# Patient Record
Sex: Male | Born: 1960 | Race: White | Hispanic: No | Smoking: Former smoker
Health system: Southern US, Community
[De-identification: ages and names within clinical notes are randomized; demographics above are authoritative.]

## PROBLEM LIST (undated history)

## (undated) DIAGNOSIS — K766 Portal hypertension: Secondary | ICD-10-CM

## (undated) DIAGNOSIS — D689 Coagulation defect, unspecified: Secondary | ICD-10-CM

## (undated) DIAGNOSIS — J189 Pneumonia, unspecified organism: Secondary | ICD-10-CM

## (undated) DIAGNOSIS — D649 Anemia, unspecified: Secondary | ICD-10-CM

## (undated) DIAGNOSIS — D696 Thrombocytopenia, unspecified: Secondary | ICD-10-CM

## (undated) DIAGNOSIS — K5790 Diverticulosis of intestine, part unspecified, without perforation or abscess without bleeding: Secondary | ICD-10-CM

## (undated) DIAGNOSIS — D126 Benign neoplasm of colon, unspecified: Secondary | ICD-10-CM

## (undated) DIAGNOSIS — E785 Hyperlipidemia, unspecified: Secondary | ICD-10-CM

## (undated) DIAGNOSIS — K7030 Alcoholic cirrhosis of liver without ascites: Secondary | ICD-10-CM

## (undated) DIAGNOSIS — L409 Psoriasis, unspecified: Secondary | ICD-10-CM

## (undated) DIAGNOSIS — F1010 Alcohol abuse, uncomplicated: Secondary | ICD-10-CM

## (undated) DIAGNOSIS — I8500 Esophageal varices without bleeding: Secondary | ICD-10-CM

## (undated) DIAGNOSIS — I10 Essential (primary) hypertension: Secondary | ICD-10-CM

## (undated) DIAGNOSIS — R42 Dizziness and giddiness: Secondary | ICD-10-CM

## (undated) DIAGNOSIS — K219 Gastro-esophageal reflux disease without esophagitis: Secondary | ICD-10-CM

## (undated) HISTORY — DX: Thrombocytopenia, unspecified: D69.6

## (undated) HISTORY — PX: OTHER SURGICAL HISTORY: SHX169

## (undated) HISTORY — DX: Hyperlipidemia, unspecified: E78.5

## (undated) HISTORY — DX: Alcoholic cirrhosis of liver without ascites: K70.30

## (undated) HISTORY — DX: Alcohol abuse, uncomplicated: F10.10

## (undated) HISTORY — DX: Esophageal varices without bleeding: I85.00

## (undated) HISTORY — DX: Diverticulosis of intestine, part unspecified, without perforation or abscess without bleeding: K57.90

## (undated) HISTORY — DX: Coagulation defect, unspecified: D68.9

## (undated) HISTORY — DX: Benign neoplasm of colon, unspecified: D12.6

## (undated) HISTORY — DX: Portal hypertension: K76.6

## (undated) HISTORY — DX: Dizziness and giddiness: R42

## (undated) HISTORY — DX: Gastro-esophageal reflux disease without esophagitis: K21.9

## (undated) HISTORY — PX: LIVER TRANSPLANT: SHX410

## (undated) HISTORY — DX: Psoriasis, unspecified: L40.9

## (undated) HISTORY — DX: Pneumonia, unspecified organism: J18.9

---

## 2006-07-14 ENCOUNTER — Emergency Department (HOSPITAL_COMMUNITY): Admission: EM | Admit: 2006-07-14 | Discharge: 2006-07-14 | Payer: Self-pay | Admitting: Family Medicine

## 2011-12-12 DIAGNOSIS — F1010 Alcohol abuse, uncomplicated: Secondary | ICD-10-CM

## 2011-12-12 HISTORY — DX: Alcohol abuse, uncomplicated: F10.10

## 2012-11-22 ENCOUNTER — Emergency Department (HOSPITAL_COMMUNITY)
Admission: EM | Admit: 2012-11-22 | Discharge: 2012-11-22 | Disposition: A | Discharge status: Home / Self Care | Payer: BC Managed Care – PPO | Attending: Emergency Medicine | Admitting: Emergency Medicine

## 2012-11-22 ENCOUNTER — Encounter (HOSPITAL_COMMUNITY): Payer: Self-pay | Admitting: *Deleted

## 2012-11-22 ENCOUNTER — Emergency Department (HOSPITAL_COMMUNITY): Payer: BC Managed Care – PPO

## 2012-11-22 DIAGNOSIS — I10 Essential (primary) hypertension: Secondary | ICD-10-CM | POA: Insufficient documentation

## 2012-11-22 DIAGNOSIS — R4182 Altered mental status, unspecified: Secondary | ICD-10-CM | POA: Insufficient documentation

## 2012-11-22 DIAGNOSIS — F10929 Alcohol use, unspecified with intoxication, unspecified: Secondary | ICD-10-CM

## 2012-11-22 DIAGNOSIS — F1010 Alcohol abuse, uncomplicated: Secondary | ICD-10-CM | POA: Insufficient documentation

## 2012-11-22 HISTORY — DX: Essential (primary) hypertension: I10

## 2012-11-22 LAB — URINALYSIS, ROUTINE W REFLEX MICROSCOPIC
Bilirubin Urine: NEGATIVE
Ketones, ur: NEGATIVE mg/dL
Nitrite: NEGATIVE
pH: 5 (ref 5.0–8.0)

## 2012-11-22 LAB — HEPATIC FUNCTION PANEL
ALT: 86 U/L — ABNORMAL HIGH (ref 0–53)
Alkaline Phosphatase: 106 U/L (ref 39–117)
Bilirubin, Direct: <0.1 mg/dL (ref 0.0–0.3)
Total Protein: 7.9 g/dL (ref 6.0–8.3)

## 2012-11-22 LAB — CBC
MCH: 34 pg (ref 26.0–34.0)
MCV: 97.6 fL (ref 78.0–100.0)
Platelets: 171 10*3/uL (ref 150–400)
RDW: 12.7 % (ref 11.5–15.5)

## 2012-11-22 LAB — POCT I-STAT, CHEM 8
BUN: 10 mg/dL (ref 6–23)
Calcium, Ion: 1.1 mmol/L — ABNORMAL LOW (ref 1.12–1.23)
Chloride: 104 mEq/L (ref 96–112)
Glucose, Bld: 127 mg/dL — ABNORMAL HIGH (ref 70–99)

## 2012-11-22 LAB — RAPID URINE DRUG SCREEN, HOSP PERFORMED
Barbiturates: NOT DETECTED
Benzodiazepines: NOT DETECTED
Tetrahydrocannabinol: NOT DETECTED

## 2012-11-22 LAB — BASIC METABOLIC PANEL
Calcium: 8.9 mg/dL (ref 8.4–10.5)
Creatinine, Ser: 0.86 mg/dL (ref 0.50–1.35)
GFR calc Af Amer: >90 mL/min (ref >90)
GFR calc non Af Amer: >90 mL/min (ref >90)

## 2012-11-22 LAB — PROTIME-INR: Prothrombin Time: 13.9 seconds (ref 11.6–15.2)

## 2012-11-22 MED ORDER — LORAZEPAM 1 MG PO TABS: 1.0000 mg | ORAL_TABLET | Freq: Three times a day (TID) | ORAL | Status: DC | PRN

## 2012-11-22 MED ORDER — ALUM & MAG HYDROXIDE-SIMETH 200-200-20 MG/5ML PO SUSP: 30.0000 mL | ORAL | Status: DC | PRN

## 2012-11-22 MED ORDER — ACETAMINOPHEN 325 MG PO TABS: 650.0000 mg | ORAL_TABLET | ORAL | Status: DC | PRN

## 2012-11-22 MED ORDER — SODIUM CHLORIDE 0.9 % IV SOLN
INTRAVENOUS | Status: DC
  Administered 2012-11-22 (×2): via INTRAVENOUS

## 2012-11-22 MED ORDER — LISINOPRIL-HYDROCHLOROTHIAZIDE 20-25 MG PO TABS: 1.0000 | ORAL_TABLET | Freq: Every day | ORAL | Status: DC

## 2012-11-22 MED ORDER — PROMETHAZINE HCL 25 MG PO TABS: 25.0000 mg | ORAL_TABLET | Freq: Four times a day (QID) | ORAL | Status: DC | PRN

## 2012-11-22 MED ORDER — LISINOPRIL 20 MG PO TABS
20.0000 mg | ORAL_TABLET | Freq: Every day | ORAL | Status: DC
  Administered 2012-11-22: 20 mg via ORAL
  Filled 2012-11-22: qty 1

## 2012-11-22 MED ORDER — THIAMINE HCL 100 MG/ML IJ SOLN
100.0000 mg | Freq: Every day | INTRAMUSCULAR | Status: DC
  Administered 2012-11-22: 100 mg via INTRAVENOUS
  Filled 2012-11-22: qty 2

## 2012-11-22 MED ORDER — CLONIDINE HCL 0.2 MG PO TABS
0.2000 mg | ORAL_TABLET | Freq: Two times a day (BID) | ORAL | Status: DC
  Administered 2012-11-22: 0.2 mg via ORAL
  Filled 2012-11-22: qty 1

## 2012-11-22 MED ORDER — THIAMINE HCL 100 MG/ML IJ SOLN: 100.0000 mg | Freq: Every day | INTRAMUSCULAR | Status: DC

## 2012-11-22 MED ORDER — ONDANSETRON HCL 8 MG PO TABS: 4.0000 mg | ORAL_TABLET | Freq: Three times a day (TID) | ORAL | Status: DC | PRN

## 2012-11-22 MED ORDER — IBUPROFEN 200 MG PO TABS: 600.0000 mg | ORAL_TABLET | Freq: Three times a day (TID) | ORAL | Status: DC | PRN

## 2012-11-22 MED ORDER — HYDROCHLOROTHIAZIDE 25 MG PO TABS
25.0000 mg | ORAL_TABLET | Freq: Every day | ORAL | Status: DC
  Administered 2012-11-22: 25 mg via ORAL
  Filled 2012-11-22: qty 1

## 2012-11-22 MED ORDER — POTASSIUM CHLORIDE ER 10 MEQ PO TBCR
10.0000 meq | EXTENDED_RELEASE_TABLET | Freq: Two times a day (BID) | ORAL | Status: DC
Start: 2012-11-22 — End: 2012-11-22
  Administered 2012-11-22: 10 meq via ORAL
  Filled 2012-11-22 (×2): qty 1

## 2012-11-22 NOTE — ED Notes (Signed)
Code stroke encoded at [phone].  Stroke called at [phone].  Pt arrival at [phone].  EDP exam [phone].  Stroke team arrival [phone].  Last seen normal 0145.  Pt arrival in CT 0645.  Phlebotomist arrival [phone]. CT read 0650.  Arrival back in room at [phone]

## 2012-11-22 NOTE — ED Notes (Signed)
Dr Preston Fleeting made aware of panic etoh level 474

## 2012-11-22 NOTE — ED Notes (Signed)
Report given to [place], RN

## 2012-11-22 NOTE — ED Notes (Signed)
Pt wife aggressive with RN that the medical staff has not done enough testing to determine that the cause of pts episode was alcohol related.  Per wife, pt drinks like this all the time and has never had this happen and something else must be wrong to have caused this.  RN asked wife what other testing she would like for patient to receive.   Pt wife wanting to know what process was and how long it would take as she and patient are scheduled to go with group of friends for limo wine tasting and pt wants to go.  RN spoke to MD cook about process to get patient medically cleared and that the patient was not interested in inpatient detox.

## 2012-11-22 NOTE — ED Notes (Signed)
Pts wife reports that pt went to bed at 0145.  When pts alarm went off at 0545, pt was unable to turn alarm off and was disorientated.  Pts wife reports that pt was only speaking one word 'NO' and had slurred speech.  Reports that he drinks heavily daily.  Denies that pt got up to drink anymore after going to bed.

## 2012-11-22 NOTE — ED Provider Notes (Signed)
History     CSN: [id number]  Arrival date & time 11/22/12  4098   First MD Initiated Contact with Patient 11/22/12 0645      Chief Complaint  Patient presents with  . Code Stroke    (Consider location/radiation/quality/duration/timing/severity/associated sxs/prior treatment) The history is provided by the spouse and the EMS personnel. The history is limited by the condition of the patient (Altered mental status).   51 year old male was brought in by EMS as a possible code stroke. He was last seen normal by his wife when he went to bed at 1:45 AM. His wife states that [place] and he did not have the muscle control to turn it off. She tried speaking to him and at first he would only speak in 3 word sentences and then it reduced word to time. His speech was slurred that he was not moving his right side as well as his left side. He is a regular drinker and had been drinking last night although his wife is unsure how much. She feels fairly certain that he did not get up and drink anymore alcohol after going to bed.  Past Medical History  Diagnosis Date  . Hypertension     History reviewed. No pertinent past surgical history.  History reviewed. No pertinent family history.  History  Substance Use Topics  . Smoking status: Not on file  . Smokeless tobacco: Not on file  . Alcohol Use:       Review of Systems  Unable to perform ROS: Mental status change    Allergies  Review of patient's allergies indicates no known allergies.  Home Medications  No current outpatient prescriptions on file.  There were no vitals taken for this visit.  Physical Exam  Nursing note and vitals reviewed. 51 year old male, resting comfortably and in no acute distress. Vital signs are normal. Oxygen saturation is 93%, which is normal. Head is normocephalic and atraumatic. PERRLA. Oropharynx is clear. There is no facial asymmetry. He does not cooperate for EOM exam. Neck is nontender and supple  without adenopathy or JVD. Back is nontender and there is no CVA tenderness. Lungs are clear without rales, wheezes, or rhonchi. Chest is nontender. Heart has regular rate and rhythm without murmur. Abdomen is soft, flat, nontender without masses or hepatosplenomegaly and peristalsis is normoactive. Extremities have no cyanosis or edema, full range of motion is present. Skin is warm and dry without rash. Neurologic: He is somnolent but does sometimes rales to voice and sometimes arouses to pain. He does have purposeful movement and sometimes follows commands. Cranial nerves are grossly intact. There are no gross motor or sensory deficits.   ED Course  Procedures (including critical care time)  Results for orders placed during the hospital encounter of 11/22/12  CBC      Component Value Range   WBC 6.5  4.0 - 10.5 K/uL   RBC 4.20 (*) 4.22 - 5.81 MIL/uL   Hemoglobin 14.3  13.0 - 17.0 g/dL   HCT 11.9  14.7 - 82.9 %   MCV 97.6  78.0 - 100.0 fL   MCH 34.0  26.0 - 34.0 pg   MCHC 34.9  30.0 - 36.0 g/dL   RDW 56.2  13.0 - 86.5 %   Platelets 171  150 - 400 K/uL  BASIC METABOLIC PANEL      Component Value Range   Sodium 139  135 - 145 mEq/L   Potassium 4.1  3.5 - 5.1 mEq/L   Chloride  101  96 - 112 mEq/L   CO2 21  19 - 32 mEq/L   Glucose, Bld 127 (*) 70 - 99 mg/dL   BUN 10  6 - 23 mg/dL   Creatinine, Ser 3.24  0.50 - 1.35 mg/dL   Calcium 8.9  8.4 - 40.1 mg/dL   GFR calc non Af Amer >90  >90 mL/min   GFR calc Af Amer >90  >90 mL/min  PROTIME-INR      Component Value Range   Prothrombin Time 13.9  11.6 - 15.2 seconds   INR 1.08  0.00 - 1.49  APTT      Component Value Range   aPTT 30  24 - 37 seconds  TROPONIN I      Component Value Range   Troponin I <0.30  <0.30 ng/mL  ETHANOL      Component Value Range   Alcohol, Ethyl (B) 473 (*) 0 - 11 mg/dL  POCT I-STAT, CHEM 8      Component Value Range   Sodium 142  135 - 145 mEq/L   Potassium 3.9  3.5 - 5.1 mEq/L   Chloride 104  96 -  112 mEq/L   BUN 10  6 - 23 mg/dL   Creatinine, Ser 0.27 (*) 0.50 - 1.35 mg/dL   Glucose, Bld 253 (*) 70 - 99 mg/dL   Calcium, Ion 6.64 (*) 1.12 - 1.23 mmol/L   TCO2 25  0 - 100 mmol/L   Hemoglobin 15.0  13.0 - 17.0 g/dL   HCT 40.3  47.4 - 25.9 %   Ct Head Wo Contrast  11/22/2012  *RADIOLOGY REPORT*  Clinical Data: Code stroke.  Left-sided weakness.  CT HEAD WITHOUT CONTRAST  Technique:  Contiguous axial images were obtained from the base of the skull through the vertex without contrast.  Comparison: None.  Findings: Mild diffuse cerebral atrophy. No mass effect or midline shift.  Mild ventricular dilatation consistent with central atrophy.  Gray-white matter junctions are distinct.  Basal cisterns are not effaced.  No acute intracranial hemorrhage.  No depressed skull fractures.  Visualized paranasal sinuses and mastoid air cells are not opacified.  IMPRESSION: No acute intracranial abnormalities.  Code stroke results telephoned to Dr. Preston Fleeting at the time of dictation, [phone] on 11/22/2012.   Original Report Authenticated By: Burman Nieves, M.D.      Date: 11/22/2012  Rate: 92  Rhythm: normal sinus rhythm  QRS Axis: normal  Intervals: normal  ST/T Wave abnormalities: normal  Conduction Disutrbances:Right bundle-branch block  Narrative Interpretation: Right bundle branch block. No prior ECG available for comparison.  Old EKG Reviewed: none available    1. Altered mental status   2. Alcohol intoxication     CRITICAL CARE Performed by: Dione Booze   Total critical care time: 90 minutes  Critical care time was exclusive of separately billable procedures and treating other patients.  Critical care was necessary to treat or prevent imminent or life-threatening deterioration.  Critical care was time spent personally by me on the following activities: development of treatment plan with patient and/or surrogate as well as nursing, discussions with consultants, evaluation of  patient's response to treatment, examination of patient, obtaining history from patient or surrogate, ordering and performing treatments and interventions, ordering and review of laboratory studies, ordering and review of radiographic studies, pulse oximetry and re-evaluation of patient's condition.\  MDM  Altered mental status without any focal component. Case was seen in conjunction with Dr. Amada Jupiter of neurology service and decision was made  to cancel code stroke. Because of history of alcohol abuse, he was given intramuscular thiamine. At this point, alcohol level is pending. He is acting like someone who is severely intoxicated.  Alcohol level is come back at 477. This is certainly sufficient to account for his clinical presentation. He will need to be watched as the alcohol level goes down to make sure that he regains his normal mental status. Case is endorsed to Dr. Adriana Simas to follow the patient as his alcohol level drops.      Dione Booze, MD 11/22/12 [phone]

## 2012-11-22 NOTE — ED Notes (Signed)
Pt's Iv was not running was bent at the site IV d/c left hand and restarted # 20rt hand w/o diff

## 2012-11-22 NOTE — ED Notes (Signed)
Code stroke cancelled at 0715 by Dr. Amada Jupiter, neuro MD.

## 2012-11-22 NOTE — Consult Note (Signed)
Reason for Consult: Altered Mental Status Referring Physician: Dione Booze  CC: Altered Mental Status  History is obtained from: Wife  HPI: Thomas [name] is a 51 y.o. male who drove to pick up his wife around 11:15 pm, after they got home he had a single drink and then she checked on him at 1:45 am and he seemed normal at that time. Their alarm went off at 5:45 am and he had difficulty turning off the alarm. She heard him stumbling outside and then when he came back from getting the paper, he laid down and wasn't talking much so his wife activated EMS. On EMS arrival, they felt that he was not moving his left side as much as his right and therefore activated a code stroke.   He does drink "too much" per his wife, but she thinks that he only had a single drink after coming home last night.   LSN: 1:45 am tpa given: no outside of window, not a stroke NIHSS: 4  ROS: unable to obtain due to AMS  Past Medical History  Diagnosis Date  . Hypertension     Family History: Unable to obtain due to AMS  Social History: Tob: chews Drinks "too much"  Exam: Current vital signs: There were no vitals taken for this visit.  Vital signs in last 24 hours:   General: In bed CV: RRR Mental Status: Patient is lethargic, but does answer month, age, identifies wife when given repeated painful sitmuli.  He has a mild dysarthria. He is able to repeat.  Cranial Nerves: II: Visual Fields are full. Pupils are equal, round, and reactive to light.  Discs are difficult to visualize. III,IV, VI: He does track across midline in both directions, but not fully to either side.  V: Facial sensation is symmetric to lt VII: Facial movement is symmetric.  VIII: hearing is intact to voice X: Uvula elevates symmetrically XI: Shoulder shrug is symmetric. XII: tongue is midline without atrophy or fasciculations.  Motor: Tone is normal. Bulk is normal. 5/5 strength was present in all four extremities.   Sensory: Sensation is symmetric to light touch  in the arms and legs. Deep Tendon Reflexes: 2+ and symmetric in the biceps and patellae.  Cerebellar: He reaches with his entire hand when asked to perform FNF, and does have some horizontal sway to the arm bilaterally. He is able to perform HKS.  Gait: Did not assess secondary to patient safety concerns.    I have reviewed labs in epic and the results pertinent to this consultation are: BMP: mild renal insufficiency  I have reviewed the images obtained:CT head - no acute findings.   Impression: 51 yo M with altered mental status. His exam is most consistent with acute intoxication. I have ordered IV thiamine as Wernicke's may be in the differential. It is unclear to me if he truly did have focal findings earlier. He does not have any focal findings on exam. If his EtOH level does not come back at a level to describe his current mental state, then further workup will need to be done.   Recommendations: 1) EtOH level, UDS 2) IV thiamine 3) ABG 4) Further recommendations pending the above studies.    Ritta Slot, MD [facility] [phone]  If 7pm- 7am, please page neurology on call at [phone].

## 2014-10-14 ENCOUNTER — Ambulatory Visit
Admission: RE | Admit: 2014-10-14 | Discharge: 2014-10-14 | Disposition: A | Discharge status: Home / Self Care | Payer: BC Managed Care – PPO | Source: Ambulatory Visit | Attending: Physician Assistant | Admitting: Physician Assistant

## 2014-10-14 ENCOUNTER — Other Ambulatory Visit: Payer: Self-pay | Admitting: Physician Assistant

## 2014-10-14 DIAGNOSIS — R05 Cough: Secondary | ICD-10-CM

## 2014-10-14 DIAGNOSIS — R059 Cough, unspecified: Secondary | ICD-10-CM

## 2015-04-26 ENCOUNTER — Encounter: Payer: Self-pay | Admitting: Internal Medicine

## 2015-05-12 DIAGNOSIS — D689 Coagulation defect, unspecified: Secondary | ICD-10-CM

## 2015-05-12 DIAGNOSIS — K7030 Alcoholic cirrhosis of liver without ascites: Secondary | ICD-10-CM

## 2015-05-12 HISTORY — DX: Alcoholic cirrhosis of liver without ascites: K70.30

## 2015-05-12 HISTORY — DX: Coagulation defect, unspecified: D68.9

## 2015-05-17 ENCOUNTER — Other Ambulatory Visit (INDEPENDENT_AMBULATORY_CARE_PROVIDER_SITE_OTHER): Payer: BLUE CROSS/BLUE SHIELD

## 2015-05-17 ENCOUNTER — Encounter: Payer: Self-pay | Admitting: Internal Medicine

## 2015-05-17 ENCOUNTER — Ambulatory Visit (INDEPENDENT_AMBULATORY_CARE_PROVIDER_SITE_OTHER): Payer: BLUE CROSS/BLUE SHIELD | Admitting: Internal Medicine

## 2015-05-17 VITALS — BP 166/92 | HR 72 | Ht 65.0 in | Wt 185.0 lb

## 2015-05-17 DIAGNOSIS — R6881 Early satiety: Secondary | ICD-10-CM

## 2015-05-17 DIAGNOSIS — F1010 Alcohol abuse, uncomplicated: Secondary | ICD-10-CM

## 2015-05-17 DIAGNOSIS — R140 Abdominal distension (gaseous): Secondary | ICD-10-CM

## 2015-05-17 DIAGNOSIS — Z1211 Encounter for screening for malignant neoplasm of colon: Secondary | ICD-10-CM

## 2015-05-17 MED ORDER — NA SULFATE-K SULFATE-MG SULF 17.5-3.13-1.6 GM/177ML PO SOLN: ORAL | Status: DC

## 2015-05-17 MED ORDER — PANTOPRAZOLE SODIUM 40 MG PO TBEC: 40.0000 mg | DELAYED_RELEASE_TABLET | Freq: Every day | ORAL | Status: DC

## 2015-05-17 NOTE — Progress Notes (Signed)
Patient ID: Thomas [name], male   DOB: July 28, 1961, 54 y.o.   MRN: [id number] HPI: Thomas [name] is a 54 year old male with a past medical history of hypertension and hyperlipidemia who is seen to evaluate early satiety and abdominal bloating. He is here alone today on insistence from his wife. He reports over the last several months having early satiety though this has not led to weight loss. He denies nausea or vomiting. 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 He does notice upper abdominal bloating which is worse with eating. He denies true abdominal pain. Reports bowel movements are mostly regular though rarely he has constipation and will use MiraLAX. He denies melena or blood in his stool or with wiping. Denies family history of GI tract malignancy, liver disease or IBD. He was recently seen by urology and started on a new medication for his prostate, this sounds like probably an alpha-blocker for BPH.  I have reviewed records in 2013 he was seen in the ER with acute alcohol intoxication. Blood alcohol level was significantly elevated at 473. He also has an history of elevated liver enzymes. I inquired about alcohol use. He admits to alcohol use on average 2-3 beers per day. He reports on weekends he can often drink 4-5 beers a day and even binge drink on special occasions like a concert. He has drank alcohol for many years but denies feeling like he has an alcohol problem. He denies lower extremity edema, increasing abdominal girth. No jaundice or itching. No history of alcohol rehabilitation.  Past Medical History  Diagnosis Date  . Hypertension   . Hyperlipidemia     No past surgical history on file.  Outpatient Prescriptions Prior to Visit  Medication Sig Dispense Refill  . cloNIDine (CATAPRES) 0.2 MG tablet Take 0.2 mg by mouth 2 (two) times daily.    Marland Kitchen lisinopril-hydrochlorothiazide (PRINZIDE,ZESTORETIC) 20-25 MG per tablet Take 1 tablet by mouth daily.     . potassium chloride (K-DUR) 10 MEQ tablet Take 10 mEq by mouth 2 (two) times daily.    . promethazine (PHENERGAN) 25 MG tablet Take 1 tablet (25 mg total) by mouth every 6 (six) hours as needed for nausea. 20 tablet 0  . LORazepam (ATIVAN) 1 MG tablet Take 1 tablet (1 mg total) by mouth 3 (three) times daily as needed for anxiety. 20 tablet 0   No facility-administered medications prior to visit.    No Known Allergies  Family History  Problem Relation Age of Onset  . Diabetes Father   . Diabetes Paternal Aunt   . Parkinson's disease Father     History  Substance Use Topics  . Smoking status: Never Smoker   . Smokeless tobacco: Never Used  . Alcohol Use: 0.0 oz/week    0 Standard drinks or equivalent per week     Comment: 2-3 beers per day    ROS: As per history of present illness, otherwise negative  BP 166/92 mmHg  Pulse 72  Ht 5\' 5"  (1.651 m)  Wt 185 lb (83.915 kg)  BMI 30.79 kg/m2 Constitutional: Well-developed and well-nourished. No distress. HEENT: Normocephalic and atraumatic. Oropharynx is clear and moist. No oropharyngeal exudate. Conjunctivae are normal.  No scleral icterus. Neck: Neck supple. Trachea midline. Cardiovascular: Normal rate, regular rhythm and intact distal pulses. No M/R/G Pulmonary/chest: Effort normal and breath sounds normal. No wheezing, rales or rhonchi. Abdominal: Soft, obese, nontender, nondistended. Bowel sounds active throughout. No definitive fluid wave Extremities: no clubbing,  cyanosis, trace pretibial edema Lymphadenopathy: No cervical adenopathy noted. Neurological: Alert and oriented to person place and time. No asterixis Skin: Skin is warm and dry. No rashes noted. Psychiatric: Normal mood and affect. Behavior is normal.  RELEVANT LABS AND IMAGING: CBC    Component Value Date/Time   WBC 6.5 11/22/2012 0640   RBC 4.20* 11/22/2012 0640   HGB 15.0 11/22/2012 0648   HCT 44.0 11/22/2012 0648   PLT 171 11/22/2012 0640   MCV  97.6 11/22/2012 0640   MCH 34.0 11/22/2012 0640   MCHC 34.9 11/22/2012 0640   RDW 12.7 11/22/2012 0640    CMP     Component Value Date/Time   NA 142 11/22/2012 0648   K 3.9 11/22/2012 0648   CL 104 11/22/2012 0648   CO2 21 11/22/2012 0640   GLUCOSE 127* 11/22/2012 0648   BUN 10 11/22/2012 0648   CREATININE 1.50* 11/22/2012 0648   CALCIUM 8.9 11/22/2012 0640   PROT 7.9 11/22/2012 0641   ALBUMIN 4.0 11/22/2012 0641   AST 114* 11/22/2012 0641   ALT 86* 11/22/2012 0641   ALKPHOS 106 11/22/2012 0641   BILITOT 0.2* 11/22/2012 0641   GFRNONAA >90 11/22/2012 0640   GFRAA >90 11/22/2012 0640    ASSESSMENT/PLAN:  54 year old male with a past medical history of hypertension and hyperlipidemia who is seen to evaluate early satiety and abdominal bloating. He is here alone today on insistence from his wife. He reports over the last several months having early satiety though this has not led to weight loss.   1. Early satiety and upper abdominal bloating -- upper endoscopy recommended. Test discussed including the risks and benefits and he is agreeable to proceed. Rule out alcoholic gastritis, ulcer disease, H. pylori.  Trial of pantoprazole 40 mg once daily started today.  2. Colorectal cancer screening -- average risk, no prior colonoscopy.  Procedure recommended and discussed, including the risk, benefits, and alteratives, he is agreeable to procedure.     3.  ETOH use/abuse -- I'm highly suspicious that he is abusing alcohol. We discussed this today. He has a history of elevated liver enzymes. Repeat liver enzymes today. Check blood alcohol level. Viral hepatitis serologies. ANA. Abdominal ultrasound to evaluate hepatic parenchyma. Expect fatty liver exacerbated by alcohol use. No definite evidence for cirrhosis at this time though will check CBC to look for thrombocytopenia and also INR. Alcohol reduction strongly advised no more than 2 alcoholic drinks per day, though we discussed likely he  would do best with no alcohol at all. Further recommendations after procedures, imaging, and lab workr  Cc:L.dean Mullan, Md [address] [place], [place] [address]

## 2015-05-17 NOTE — Patient Instructions (Addendum)
Your physician has requested that you go to the basement for the lab work before leaving today.  You have been scheduled for an endoscopy and colonoscopy. Please follow the written instructions given to you at your visit today. Please pick up your prep supplies at the pharmacy within the next 1-3 days. If you use inhalers (even only as needed), please bring them with you on the day of your procedure.  We have sent the following medications to your pharmacy for you to pick up at your convenience: Pantoprazole  You have been scheduled for an abdominal ultrasound at [facility] (1st floor of hospital) on 05/24/15 at 2:00pm. Please arrive 15 minutes prior to your appointment for registration. Make certain not to have anything to eat or drink 6 hours prior to your appointment. Should you need to reschedule your appointment, please contact radiology at [phone]. This test typically takes about 30 minutes to perform.   I appreciate the opportunity to care for you.

## 2015-05-18 ENCOUNTER — Other Ambulatory Visit: Payer: BLUE CROSS/BLUE SHIELD

## 2015-05-18 LAB — HEPATIC FUNCTION PANEL
ALT: 34 U/L (ref 0–53)
AST: 166 U/L — ABNORMAL HIGH (ref 0–37)
Albumin: 3.6 g/dL (ref 3.5–5.2)
Alkaline Phosphatase: 283 U/L — ABNORMAL HIGH (ref 39–117)
Bilirubin, Direct: 1 mg/dL — ABNORMAL HIGH (ref 0.0–0.3)
TOTAL PROTEIN: 8.5 g/dL — AB (ref 6.0–8.3)
Total Bilirubin: 2.2 mg/dL — ABNORMAL HIGH (ref 0.2–1.2)

## 2015-05-18 LAB — IBC PANEL
IRON: 118 ug/dL (ref 42–165)
Saturation Ratios: 49.9 % (ref 20.0–50.0)
Transferrin: 169 mg/dL — ABNORMAL LOW (ref 212.0–360.0)

## 2015-05-18 LAB — FERRITIN: FERRITIN: 802.8 ng/mL — AB (ref 22.0–322.0)

## 2015-05-19 ENCOUNTER — Other Ambulatory Visit: Payer: BLUE CROSS/BLUE SHIELD

## 2015-05-19 LAB — HEPATITIS B CORE ANTIBODY, TOTAL: Hep B Core Total Ab: NONREACTIVE

## 2015-05-19 LAB — ETHANOL: Alcohol, Ethyl (B): <10 mg/dL (ref 0–10)

## 2015-05-19 LAB — HEPATITIS B SURFACE ANTIGEN: Hepatitis B Surface Ag: NEGATIVE

## 2015-05-19 LAB — HEPATITIS C ANTIBODY: HCV Ab: NEGATIVE

## 2015-05-19 LAB — HEPATITIS B SURFACE ANTIBODY,QUALITATIVE: HEP B S AB: NEGATIVE

## 2015-05-19 LAB — ANA: Anti Nuclear Antibody(ANA): NEGATIVE

## 2015-05-19 LAB — MITOCHONDRIAL ANTIBODIES: Mitochondrial M2 Ab, IgG: 0.88 (ref <0.91)

## 2015-05-20 LAB — PROTIME-INR
INR: 1.5 ratio — ABNORMAL HIGH (ref 0.8–1.0)
PROTHROMBIN TIME: 16.7 s — AB (ref 9.6–13.1)

## 2015-05-20 LAB — ALPHA-1-ANTITRYPSIN: A1 ANTITRYPSIN SER: 144 mg/dL (ref 83–199)

## 2015-05-24 ENCOUNTER — Ambulatory Visit (HOSPITAL_COMMUNITY)
Admission: RE | Admit: 2015-05-24 | Discharge: 2015-05-24 | Disposition: A | Discharge status: Home / Self Care | Payer: BLUE CROSS/BLUE SHIELD | Source: Ambulatory Visit | Attending: Internal Medicine | Admitting: Internal Medicine

## 2015-05-24 DIAGNOSIS — R140 Abdominal distension (gaseous): Secondary | ICD-10-CM | POA: Insufficient documentation

## 2015-05-24 DIAGNOSIS — R6881 Early satiety: Secondary | ICD-10-CM | POA: Diagnosis not present

## 2015-05-24 DIAGNOSIS — K760 Fatty (change of) liver, not elsewhere classified: Secondary | ICD-10-CM | POA: Insufficient documentation

## 2015-05-24 DIAGNOSIS — Z1211 Encounter for screening for malignant neoplasm of colon: Secondary | ICD-10-CM

## 2015-05-25 ENCOUNTER — Other Ambulatory Visit: Payer: Self-pay

## 2015-05-25 DIAGNOSIS — R7989 Other specified abnormal findings of blood chemistry: Secondary | ICD-10-CM

## 2015-05-25 DIAGNOSIS — K7689 Other specified diseases of liver: Secondary | ICD-10-CM

## 2015-05-25 DIAGNOSIS — K769 Liver disease, unspecified: Secondary | ICD-10-CM

## 2015-05-25 DIAGNOSIS — R945 Abnormal results of liver function studies: Principal | ICD-10-CM

## 2015-05-31 ENCOUNTER — Other Ambulatory Visit (INDEPENDENT_AMBULATORY_CARE_PROVIDER_SITE_OTHER): Payer: BLUE CROSS/BLUE SHIELD

## 2015-05-31 DIAGNOSIS — K7689 Other specified diseases of liver: Secondary | ICD-10-CM

## 2015-05-31 DIAGNOSIS — K769 Liver disease, unspecified: Secondary | ICD-10-CM

## 2015-05-31 DIAGNOSIS — R7989 Other specified abnormal findings of blood chemistry: Secondary | ICD-10-CM

## 2015-05-31 DIAGNOSIS — R945 Abnormal results of liver function studies: Principal | ICD-10-CM

## 2015-05-31 LAB — CREATININE, SERUM: Creatinine, Ser: 0.71 mg/dL (ref 0.40–1.50)

## 2015-06-01 LAB — AFP TUMOR MARKER: AFP-Tumor Marker: 8 ng/mL — ABNORMAL HIGH (ref <6.1)

## 2015-06-03 ENCOUNTER — Other Ambulatory Visit: Payer: Self-pay | Admitting: Internal Medicine

## 2015-06-03 ENCOUNTER — Ambulatory Visit (HOSPITAL_COMMUNITY)
Admission: RE | Admit: 2015-06-03 | Discharge: 2015-06-03 | Disposition: A | Discharge status: Home / Self Care | Payer: BLUE CROSS/BLUE SHIELD | Source: Ambulatory Visit | Attending: Internal Medicine | Admitting: Internal Medicine

## 2015-06-03 DIAGNOSIS — R161 Splenomegaly, not elsewhere classified: Secondary | ICD-10-CM | POA: Diagnosis not present

## 2015-06-03 DIAGNOSIS — R140 Abdominal distension (gaseous): Secondary | ICD-10-CM | POA: Diagnosis present

## 2015-06-03 DIAGNOSIS — R932 Abnormal findings on diagnostic imaging of liver and biliary tract: Secondary | ICD-10-CM | POA: Insufficient documentation

## 2015-06-03 DIAGNOSIS — K769 Liver disease, unspecified: Secondary | ICD-10-CM

## 2015-06-03 MED ORDER — GADOBENATE DIMEGLUMINE 529 MG/ML IV SOLN
17.0000 mL | Freq: Once | INTRAVENOUS | Status: AC | PRN
  Administered 2015-06-03: 17 mL via INTRAVENOUS

## 2015-06-04 LAB — HEMOCHROMATOSIS DNA-PCR(C282Y,H63D)

## 2015-06-23 ENCOUNTER — Telehealth: Payer: Self-pay

## 2015-06-23 NOTE — Telephone Encounter (Signed)
-----   Message from Algernon Huxley, RN sent at 05/25/2015 10:01 AM EDT ----- Regarding: Labs Pt needs labs in 19mth, orders in epic

## 2015-06-23 NOTE — Telephone Encounter (Signed)
Left message to call back  

## 2015-06-28 ENCOUNTER — Ambulatory Visit: Payer: Self-pay | Admitting: Internal Medicine

## 2015-06-28 NOTE — Telephone Encounter (Signed)
Left message for pt to call back  °

## 2015-06-30 NOTE — Telephone Encounter (Signed)
Left message for pt to call back. Letter mailed to pt.  

## 2015-07-06 ENCOUNTER — Encounter: Payer: Self-pay | Admitting: *Deleted

## 2015-07-12 ENCOUNTER — Other Ambulatory Visit: Payer: Self-pay | Admitting: Internal Medicine

## 2015-07-13 ENCOUNTER — Ambulatory Visit: Payer: BLUE CROSS/BLUE SHIELD | Admitting: Internal Medicine

## 2015-07-26 ENCOUNTER — Encounter: Payer: Self-pay | Admitting: Internal Medicine

## 2015-07-29 ENCOUNTER — Encounter: Payer: BLUE CROSS/BLUE SHIELD | Admitting: Internal Medicine

## 2015-09-11 DIAGNOSIS — K766 Portal hypertension: Secondary | ICD-10-CM

## 2015-09-11 DIAGNOSIS — D126 Benign neoplasm of colon, unspecified: Secondary | ICD-10-CM

## 2015-09-11 DIAGNOSIS — I8500 Esophageal varices without bleeding: Secondary | ICD-10-CM

## 2015-09-11 DIAGNOSIS — K5790 Diverticulosis of intestine, part unspecified, without perforation or abscess without bleeding: Secondary | ICD-10-CM

## 2015-09-11 HISTORY — DX: Benign neoplasm of colon, unspecified: D12.6

## 2015-09-11 HISTORY — DX: Esophageal varices without bleeding: I85.00

## 2015-09-11 HISTORY — DX: Diverticulosis of intestine, part unspecified, without perforation or abscess without bleeding: K57.90

## 2015-09-11 HISTORY — DX: Portal hypertension: K76.6

## 2015-09-15 ENCOUNTER — Ambulatory Visit (INDEPENDENT_AMBULATORY_CARE_PROVIDER_SITE_OTHER): Payer: BLUE CROSS/BLUE SHIELD | Admitting: Internal Medicine

## 2015-09-15 ENCOUNTER — Encounter: Payer: Self-pay | Admitting: Internal Medicine

## 2015-09-15 ENCOUNTER — Other Ambulatory Visit (INDEPENDENT_AMBULATORY_CARE_PROVIDER_SITE_OTHER): Payer: BLUE CROSS/BLUE SHIELD

## 2015-09-15 VITALS — BP 154/86 | HR 104 | Ht 64.5 in | Wt 183.1 lb

## 2015-09-15 DIAGNOSIS — K7031 Alcoholic cirrhosis of liver with ascites: Secondary | ICD-10-CM | POA: Diagnosis not present

## 2015-09-15 DIAGNOSIS — K7290 Hepatic failure, unspecified without coma: Secondary | ICD-10-CM

## 2015-09-15 DIAGNOSIS — K766 Portal hypertension: Secondary | ICD-10-CM

## 2015-09-15 DIAGNOSIS — R600 Localized edema: Secondary | ICD-10-CM

## 2015-09-15 DIAGNOSIS — K7469 Other cirrhosis of liver: Secondary | ICD-10-CM

## 2015-09-15 DIAGNOSIS — F1010 Alcohol abuse, uncomplicated: Secondary | ICD-10-CM | POA: Diagnosis not present

## 2015-09-15 DIAGNOSIS — D689 Coagulation defect, unspecified: Secondary | ICD-10-CM

## 2015-09-15 DIAGNOSIS — D696 Thrombocytopenia, unspecified: Secondary | ICD-10-CM

## 2015-09-15 DIAGNOSIS — R7989 Other specified abnormal findings of blood chemistry: Secondary | ICD-10-CM

## 2015-09-15 DIAGNOSIS — R945 Abnormal results of liver function studies: Principal | ICD-10-CM

## 2015-09-15 LAB — HEPATIC FUNCTION PANEL
ALBUMIN: 2.4 g/dL — AB (ref 3.5–5.2)
ALK PHOS: 188 U/L — AB (ref 39–117)
ALT: 27 U/L (ref 0–53)
AST: 115 U/L — AB (ref 0–37)
Bilirubin, Direct: 1.9 mg/dL — ABNORMAL HIGH (ref 0.0–0.3)
TOTAL PROTEIN: 7.5 g/dL (ref 6.0–8.3)
Total Bilirubin: 4.5 mg/dL — ABNORMAL HIGH (ref 0.2–1.2)

## 2015-09-15 LAB — CBC WITH DIFFERENTIAL/PLATELET
BASOS ABS: 0.1 10*3/uL (ref 0.0–0.1)
Basophils Relative: 1.4 % (ref 0.0–3.0)
EOS ABS: 0 10*3/uL (ref 0.0–0.7)
Eosinophils Relative: 0.6 % (ref 0.0–5.0)
HCT: 34.2 % — ABNORMAL LOW (ref 39.0–52.0)
Hemoglobin: 11.8 g/dL — ABNORMAL LOW (ref 13.0–17.0)
LYMPHS ABS: 1.1 10*3/uL (ref 0.7–4.0)
Lymphocytes Relative: 13.9 % (ref 12.0–46.0)
MCHC: 34.5 g/dL (ref 30.0–36.0)
MCV: 104.1 fl — ABNORMAL HIGH (ref 78.0–100.0)
MONO ABS: 0.8 10*3/uL (ref 0.1–1.0)
Monocytes Relative: 9.7 % (ref 3.0–12.0)
NEUTROS PCT: 74.4 % (ref 43.0–77.0)
Neutro Abs: 6 10*3/uL (ref 1.4–7.7)
Platelets: 107 10*3/uL — ABNORMAL LOW (ref 150.0–400.0)
RBC: 3.29 Mil/uL — AB (ref 4.22–5.81)
RDW: 16.1 % — ABNORMAL HIGH (ref 11.5–15.5)
WBC: 8.1 10*3/uL (ref 4.0–10.5)

## 2015-09-15 LAB — PROTIME-INR
INR: 1.8 ratio — AB (ref 0.8–1.0)
PROTHROMBIN TIME: 20.1 s — AB (ref 9.6–13.1)

## 2015-09-15 MED ORDER — FUROSEMIDE 40 MG PO TABS: 40.0000 mg | ORAL_TABLET | Freq: Every day | ORAL | Status: DC

## 2015-09-15 MED ORDER — LISINOPRIL 20 MG PO TABS: 20.0000 mg | ORAL_TABLET | Freq: Every day | ORAL | Status: DC

## 2015-09-15 MED ORDER — PHYTONADIONE 5 MG PO TABS: 10.0000 mg | ORAL_TABLET | Freq: Once | ORAL | Status: DC

## 2015-09-15 MED ORDER — SPIRONOLACTONE 100 MG PO TABS: 100.0000 mg | ORAL_TABLET | Freq: Every day | ORAL | Status: DC

## 2015-09-15 MED ORDER — PANTOPRAZOLE SODIUM 40 MG PO TBEC: 40.0000 mg | DELAYED_RELEASE_TABLET | Freq: Two times a day (BID) | ORAL | Status: DC

## 2015-09-15 NOTE — Patient Instructions (Addendum)
We have sent the following medications to your pharmacy for you to pick up at your convenience: Pantoprazole 40 mg twice daily Lisinopril 20 mg daily (discontinue lisinopril/hctz combo) Lasix 40 mg daily Aldactone 40 mg daily Vitamin K 10 mg once daily x 3 days.  Your physician has requested that you go to the basement for the following lab work in 1 week: CMP, INR  Please follow a low sodium diet.  Refrain from ALL alcohol. Call our office if you start having withdrawls.  Please follow up with Dr Hilarie Fredrickson on 11/16/15 @ 10:30 am.

## 2015-09-15 NOTE — Progress Notes (Signed)
Subjective:    Patient ID: Thomas [name], male    DOB: 1961-08-03, 54 y.o.   MRN: [id number]  HPI Thomas [name] is a 54 year old male with past medical history of alcohol abuse, hypertension, hyperlipidemia who is seen in follow-up. He is here today with his wife. He was initially seen on 05/17/2015 to evaluate early satiety and abdominal bloating. After this appointment I was very concerned about alcohol abuse and abdominal ultrasound was performed which showed abnormal liver which was followed up by MRI of the liver. MRI showed diffuse micro-nodularity, steatosis, prominent caudate lobe raising suspicion for cirrhosis. There was also borderline splenomegaly and splenorenal shunting noted. He was scheduled for upper endoscopy colonoscopy which he had to cancel because it interfered with a vacation. Follow-up office visit today though he was under the impression he was here for procedures and he completed the bowel prep prior to arrival.  Both he and his wife report he continues to feel poorly. She has noticed that he has been mildly yellow in his urine has been dark. He's had increased abdominal distention and lower extremity edema. Appetite has been poor. He has early satiety which has continued. The pantoprazole which was started at last visit has not helped much. He denies seeing blood in his stool or melena. He reports that he has stopped his statin medication. He reports that his alcohol intake has decreased "significantly". He has a harder time telling me specifically how much he is drinking but just states that he is drinking less. He was in [place] in the last several weeks where he did drink wine. He is still drinking though not daily at present. I have a hard time getting from him how much he is drinking today. I frankly asked him if he were to stop completely would he withdraw and he said "I don't know".   Review of Systems As per history of present illness, otherwise negative  Current  Medications, Allergies, Past Medical History, Past Surgical History, Family History and Social History were reviewed in [employer] record.     Objective:   Physical Exam BP 154/86 mmHg  Pulse 104  Ht 5' 4.5" (1.638 m)  Wt 183 lb 2 oz (83.065 kg)  BMI 30.96 kg/m2 Constitutional: Well-developed and well-nourished though now visibly mildly jaundiced HEENT: Normocephalic and atraumatic. Oropharynx is clear and moist. No oropharyngeal exudate. Conjunctivae are normal.  Scleral icterus noted. Neck: Neck supple. Trachea midline. Cardiovascular: Tachycardic but regular Pulmonary/chest: Effort normal and breath sounds normal. No wheezing, rales or rhonchi. Abdominal: Soft, increased distention today without tenderness, rebound or guarding, Bowel sounds active throughout. Liver edge palpable Extremities: no clubbing, cyanosis, 1-2+ pitting edema to the midshin Lymphadenopathy: No cervical adenopathy noted. Neurological: Alert and oriented to person place and time. No asterixis but with fine tremor Skin: Skin is warm and dry. No rashes noted. Jaundice mild Psychiatric: Normal mood and affect. Behavior is normal.  Results for Thomas [name], Thomas [name] (MRN [id number]) as of 09/15/2015 18:52  Ref. Range 11/22/2012 06:41 11/22/2012 06:48 05/17/2015 17:06 05/31/2015 16:18 09/15/2015 11:40  Alkaline Phosphatase Latest Ref Range: 39-117 U/L 106  283 (H)  188 (H)  Albumin Latest Ref Range: 3.5-5.2 g/dL 4.0  3.6  2.4 (L)  AST Latest Ref Range: 0-37 U/L 114 (H)  166 (H)  115 (H)  ALT Latest Ref Range: 0-53 U/L 86 (H)  34  27  Total Protein Latest Ref Range: 6.0-8.3 g/dL 7.9  8.5 (H)  7.5  Bilirubin, Direct Latest  Ref Range: 0.0-0.3 mg/dL <0.1  1.0 (H)  1.9 (H)  Indirect Bilirubin Latest Ref Range: 0.3-0.9 mg/dL NOT CALCULATED      Total Bilirubin Latest Ref Range: 0.2-1.2 mg/dL 0.2 (L)  2.2 (H)  4.5 (H)    Lab Results  Component Value Date   INR 1.8* 09/15/2015   INR 1.5* 05/17/2015   INR 1.08  11/22/2012   Hemochromatosis gene -- heterozygous H63D  Iron/TIBC/Ferritin/ %Sat    Component Value Date/Time   IRON 118 05/17/2015 1706   FERRITIN 802.8* 05/17/2015 1706   IRONPCTSAT 49.9 05/17/2015 1706   Lab Results  Component Value Date   ANA NEG 05/17/2015    AMA, AMSA, viral hep all negative  CLINICAL DATA:  Abdominal bloating. Possible cirrhosis on CT scan. Gallbladder sludge. Suspected hepatic steatosis.   EXAM: MRI ABDOMEN WITHOUT AND WITH CONTRAST   TECHNIQUE: Multiplanar multisequence MR imaging of the abdomen was performed both before and after the administration of intravenous contrast.   CONTRAST:  65mL MULTIHANCE GADOBENATE DIMEGLUMINE 529 MG/ML IV SOLN   COMPARISON:  Multiple exams, including 05/24/2015 and 04/09/2015   FINDINGS: Lower chest:  Unremarkable   Hepatobiliary: Diffuse micro nodularity of the liver with micronodular signal dropout on out of phase images along with some geographic regions of signal dropout for example in the right hepatic lobe on image 45 series 5. A larger focus of signal dropout in segment 6 of the liver on image 53 of series 5 measures 1.2 cm in diameter. The visibility of the micro nodularity persists on all phases of postcontrast imaging. Prominence of the caudate lobe on lateral segment left hepatic lobe are borderline for cirrhosis.   Pancreas: Unremarkable   Spleen: Borderline splenomegaly, splenic volume 460 cc. No siderotic nodules identified in the spleen.   Adrenals/Urinary Tract: Unremarkable   Stomach/Bowel: Unremarkable   Vascular/Lymphatic: Splenorenal shunting.   Other: No supplemental non-categorized findings.   Musculoskeletal: Mild degenerative endplate findings in the thoracic spine.   IMPRESSION: 1. Diffuse micro nodularity of the liver, with corresponding micronodular signal dropout on out of phase images along with some focal and geographic areas of signal dropout, favoring an  unusual distribution of steatosis potentially in the setting of micronodular cirrhosis. The caudate lobe and lateral segment left hepatic lobe are borderline prominent which further raises suspicion for possible cirrhosis. Biopsy may be helpful in further workup. 2. Borderline splenomegaly. Splenorenal shunting noted, suggesting portal venous hypertension.     Electronically Signed   By: Van Clines M.D.   On: 06/03/2015 10:02  __________________________________________________________________________________ ULTRASOUND ABDOMEN COMPLETE   COMPARISON:  CT abdomen pelvis 04/09/2015 the   FINDINGS: Gallbladder: Gallbladder sludge. No gallbladder wall thickening or pericholecystic fluid. Negative sonographic Murphy sign.   Common bile duct: Diameter: 2 mm   Liver: Diffusely increased in echogenicity compatible with steatosis. Possible small hypoechoic hepatic lesions.   IVC: Not visualized.   Pancreas: Poorly visualized.   Spleen: Size and appearance within normal limits.   Right Kidney: Length: 11.5 cm. Echogenicity within normal limits. No mass or hydronephrosis visualized.   Left Kidney: Length: 11.0 cm. Echogenicity within normal limits. No mass or hydronephrosis visualized.   Abdominal aorta: No aneurysm visualized within the proximal or distal aorta. Mid aorta not visualized.   Other findings: None.   IMPRESSION: 1. Sludge within the gallbladder lumen. No signs to suggest acute cholecystitis. 2. Hepatic steatosis. Possible small hypoechoic hepatic lesions versus artifact. These may correspond with previously described low-attenuation nodules within the liver. Consider  correlation with MRI as previously recommended on prior CT for characterization.     Electronically Signed   By: Lovey Newcomer M.D.   On: 05/24/2015 15:31     Assessment & Plan:  54 year old male with past medical history of alcohol abuse, hypertension, hyperlipidemia who is seen in  follow-up.   1. Decompensated liver disease/alcohol abuse/portal hypertension/jaundice/thrombocytopenia/coagulopathy -- we had a long and frank discussion regarding the absolute paramount importance of complete alcohol cessation. I've asked that his wife watch him very closely for signs symptoms of withdrawal and if this occurs he needs to go to the ER. His liver disease has further decompensated since his last visit and this is evidenced by rising bilirubin, INR, ascites and lower extremity edema. We had a long discussion regarding cirrhosis and portal hypertension and the complications thereof. MELD cannot be accurately calculated today but using previous creatinine would be 19.  Hopefully if he can strictly avoid alcohol his liver disease can recover with decreasing portal hypertension. I've stressed the importance to proceed with upper endoscopy and colonoscopy as scheduled on 10/07/2015 to evaluate upper GI complaints, screen for varices, and for colon cancer screening. I'm going to start him on diuretics. See below --Complete alcohol cessation. We discussed that he is sick enough for transplant but would need to be completely alcohol abstinent 6 months before he could be considered. I've asked that he seek outpatient alcohol rehabilitation counseling and consider AA --Report to emergency department for any alcohol withdrawal symptoms --Low sodium diet --Discontinue hydrochlorothiazide --Begin Lasix 40 mg and Aldactone 100 mg daily --Vitamin K 10 mg 3 days --Repeat CMP and INR in one week --Increase pantoprazole to 40 mg twice a day --Needs hepatitis A and B vaccination, defer to next office visit --Flu vaccine recommended --Close follow-up  45 minutes spent with patient and wife today discussing the above important issues

## 2015-09-16 ENCOUNTER — Telehealth: Payer: Self-pay | Admitting: Internal Medicine

## 2015-09-16 MED ORDER — ONDANSETRON HCL 4 MG PO TABS: 4.0000 mg | ORAL_TABLET | Freq: Four times a day (QID) | ORAL | Status: DC | PRN

## 2015-09-16 NOTE — Telephone Encounter (Signed)
Dr Pyrtle-please advise.... 

## 2015-09-16 NOTE — Telephone Encounter (Signed)
Rx sent 

## 2015-09-16 NOTE — Telephone Encounter (Signed)
Zofran 4 mg ODT every 6 hours when necessary nausea

## 2015-09-22 ENCOUNTER — Other Ambulatory Visit (INDEPENDENT_AMBULATORY_CARE_PROVIDER_SITE_OTHER): Payer: BLUE CROSS/BLUE SHIELD

## 2015-09-22 DIAGNOSIS — K7031 Alcoholic cirrhosis of liver with ascites: Secondary | ICD-10-CM

## 2015-09-22 LAB — COMPREHENSIVE METABOLIC PANEL
ALK PHOS: 171 U/L — AB (ref 39–117)
ALT: 34 U/L (ref 0–53)
AST: 88 U/L — ABNORMAL HIGH (ref 0–37)
Albumin: 2.5 g/dL — ABNORMAL LOW (ref 3.5–5.2)
BILIRUBIN TOTAL: 3.2 mg/dL — AB (ref 0.2–1.2)
BUN: 6 mg/dL (ref 6–23)
CALCIUM: 8.8 mg/dL (ref 8.4–10.5)
CO2: 26 meq/L (ref 19–32)
Chloride: 104 mEq/L (ref 96–112)
Creatinine, Ser: 0.84 mg/dL (ref 0.40–1.50)
GFR: 101.48 mL/min (ref >60.00)
Glucose, Bld: 110 mg/dL — ABNORMAL HIGH (ref 70–99)
POTASSIUM: 4.1 meq/L (ref 3.5–5.1)
SODIUM: 136 meq/L (ref 135–145)
TOTAL PROTEIN: 7.6 g/dL (ref 6.0–8.3)

## 2015-09-22 LAB — PROTIME-INR
INR: 1.9 ratio — ABNORMAL HIGH (ref 0.8–1.0)
PROTHROMBIN TIME: 20.9 s — AB (ref 9.6–13.1)

## 2015-10-07 ENCOUNTER — Encounter: Payer: Self-pay | Admitting: Internal Medicine

## 2015-10-07 ENCOUNTER — Other Ambulatory Visit (INDEPENDENT_AMBULATORY_CARE_PROVIDER_SITE_OTHER): Payer: BLUE CROSS/BLUE SHIELD

## 2015-10-07 ENCOUNTER — Other Ambulatory Visit: Payer: Self-pay | Admitting: Internal Medicine

## 2015-10-07 ENCOUNTER — Ambulatory Visit: Payer: BLUE CROSS/BLUE SHIELD | Admitting: Internal Medicine

## 2015-10-07 VITALS — BP 152/90 | HR 86 | Temp 96.8°F | Resp 19 | Ht 64.5 in | Wt 183.0 lb

## 2015-10-07 DIAGNOSIS — K3189 Other diseases of stomach and duodenum: Secondary | ICD-10-CM | POA: Diagnosis not present

## 2015-10-07 DIAGNOSIS — D128 Benign neoplasm of rectum: Secondary | ICD-10-CM

## 2015-10-07 DIAGNOSIS — D120 Benign neoplasm of cecum: Secondary | ICD-10-CM | POA: Diagnosis not present

## 2015-10-07 DIAGNOSIS — K766 Portal hypertension: Secondary | ICD-10-CM

## 2015-10-07 DIAGNOSIS — R6881 Early satiety: Secondary | ICD-10-CM

## 2015-10-07 DIAGNOSIS — Z1211 Encounter for screening for malignant neoplasm of colon: Secondary | ICD-10-CM

## 2015-10-07 DIAGNOSIS — K7031 Alcoholic cirrhosis of liver with ascites: Secondary | ICD-10-CM

## 2015-10-07 LAB — CBC
HCT: 40.3 % (ref 39.0–52.0)
Hemoglobin: 13.5 g/dL (ref 13.0–17.0)
MCHC: 33.4 g/dL (ref 30.0–36.0)
MCV: 107.2 fl — AB (ref 78.0–100.0)
Platelets: 182 10*3/uL (ref 150.0–400.0)
RBC: 3.76 Mil/uL — ABNORMAL LOW (ref 4.22–5.81)
RDW: 14.6 % (ref 11.5–15.5)
WBC: 9.3 10*3/uL (ref 4.0–10.5)

## 2015-10-07 LAB — COMPREHENSIVE METABOLIC PANEL
ALK PHOS: 131 U/L — AB (ref 39–117)
ALT: 42 U/L (ref 0–53)
AST: 83 U/L — AB (ref 0–37)
Albumin: 2.8 g/dL — ABNORMAL LOW (ref 3.5–5.2)
BILIRUBIN TOTAL: 3.9 mg/dL — AB (ref 0.2–1.2)
BUN: 9 mg/dL (ref 6–23)
CO2: 19 meq/L (ref 19–32)
Calcium: 9.6 mg/dL (ref 8.4–10.5)
Chloride: 104 mEq/L (ref 96–112)
Creatinine, Ser: 0.77 mg/dL (ref 0.40–1.50)
GFR: 112.18 mL/min (ref >60.00)
GLUCOSE: 93 mg/dL (ref 70–99)
Potassium: 4.4 mEq/L (ref 3.5–5.1)
SODIUM: 135 meq/L (ref 135–145)
TOTAL PROTEIN: 8 g/dL (ref 6.0–8.3)

## 2015-10-07 MED ORDER — NADOLOL 40 MG PO TABS: 40.0000 mg | ORAL_TABLET | Freq: Every day | ORAL | Status: DC

## 2015-10-07 MED ORDER — SODIUM CHLORIDE 0.9 % IV SOLN: 500.0000 mL | INTRAVENOUS | Status: DC

## 2015-10-07 NOTE — Op Note (Signed)
[place]  [address] [place], [address]   ENDOSCOPY PROCEDURE REPORT  PATIENT: [name], [name]  MR#: [id number] BIRTHDATE: 06-04-1961 , 75  yrs. old GENDER: male ENDOSCOPIST: [name], MD PROCEDURE DATE:  10/07/2015 PROCEDURE:  EGD, diagnostic and EGD w/ biopsy ASA CLASS:     Class III INDICATIONS:  early satiety, portal hypertension, variceal screening. MEDICATIONS: Monitored anesthesia care and Propofol 150 mg IV TOPICAL ANESTHETIC: none  DESCRIPTION OF PROCEDURE: After the risks benefits and alternatives of the procedure were thoroughly explained, informed consent was obtained.  The LB [id number] endoscope was introduced through the mouth and advanced to the second portion of the duodenum , Without limitations.  The instrument was slowly withdrawn as the mucosa was fully examined.   ESOPHAGUS: There were 3 columns of small varices in the lower third of the esophagus.  These flatten with insufflation and are without signs of active or recent hemorrhage.  The Z line is regular at 39 cm  STOMACH: Moderate portal hypertensive gastropathy which  oozed slightly with minimal contact was found in the cardia, gastric fundus, and gastric body.   A single soft 78mm nodule was located antrum.  Multiple biopsies was performed.   There was antral gastropathy without erosion or ulceration  DUODENUM: The duodenal mucosa showed no abnormalities in the bulb and 2nd part of the duodenum.  Retroflexed views revealed as previously described.   No gastric varices seen.  The scope was then withdrawn from the patient and the procedure completed.  COMPLICATIONS: There were no immediate complications.   ENDOSCOPIC IMPRESSION: 1.   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   Portal hypertensive gastropathy was found in the cardia, gastric fundus, and gastric body 3.   49mm nodule was located In gastric antrum; multiple biopsies was performed 4.    Antral gastropathy 5.   The duodenal mucosa showed no abnormalities in the bulb and 2nd part of the duodenum  RECOMMENDATIONS: 1.  Await biopsy results 2.  Continue pantoprazole 40 mg daily 3.  Add nadolol 40 mg daily for prophylaxis of variceal bleeding and given portal hypertensive gastropathy 4.  Proceed with a Colonoscopy 5.  Repeat CBC, CMP and INR today  eSigned:  [name], MD 10/07/2015 9:00 AM    SX:QKSK Alroy Dust, MD and The Patient  PATIENT NAME:  Thomas [name], Thomas [name] MR#: [id number]

## 2015-10-07 NOTE — Progress Notes (Signed)
Lab tech came up to endoscopy unit and drew blood work before pt was d/c.  Thomas [name]

## 2015-10-07 NOTE — Patient Instructions (Addendum)
YOU HAD AN ENDOSCOPIC PROCEDURE TODAY AT THE [facility]:   Refer to the procedure report that was given to you for any specific questions about what was found during the examination.  If the procedure report does not answer your questions, please call your gastroenterologist to clarify.  If you requested that your care partner not be given the details of your procedure findings, then the procedure report has been included in a sealed envelope for you to review at your convenience later.  YOU SHOULD EXPECT: Some feelings of bloating in the abdomen. Passage of more gas than usual.  Walking can help get rid of the air that was put into your GI tract during the procedure and reduce the bloating. If you had a lower endoscopy (such as a colonoscopy or flexible sigmoidoscopy) you may notice spotting of blood in your stool or on the toilet paper. If you underwent a bowel prep for your procedure, you may not have a normal bowel movement for a few days.  Please Note:  You might notice some irritation and congestion in your nose or some drainage.  This is from the oxygen used during your procedure.  There is no need for concern and it should clear up in a day or so.  SYMPTOMS TO REPORT IMMEDIATELY:   Following lower endoscopy (colonoscopy or flexible sigmoidoscopy):  Excessive amounts of blood in the stool  Significant tenderness or worsening of abdominal pains  Swelling of the abdomen that is new, acute  Fever of 100F or higher   Following upper endoscopy (EGD)  Vomiting of blood or coffee ground material  New chest pain or pain under the shoulder blades  Painful or persistently difficult swallowing  New shortness of breath  Fever of 100F or higher  Black, tarry-looking stools  For urgent or emergent issues, a gastroenterologist can be reached at any hour by calling [phone].   DIET: Your first meal following the procedure should be a small meal and then it is ok to progress to  your normal diet. Heavy or fried foods are harder to digest and may make you feel nauseous or bloated.  Likewise, meals heavy in dairy and vegetables can increase bloating.  Drink plenty of fluids but you should avoid alcoholic beverages for 24 hours.  ACTIVITY:  You should plan to take it easy for the rest of today and you should NOT DRIVE or use heavy machinery until tomorrow (because of the sedation medicines used during the test).    FOLLOW UP: Our staff will call the number listed on your records the next business day following your procedure to check on you and address any questions or concerns that you may have regarding the information given to you following your procedure. If we do not reach you, we will leave a message.  However, if you are feeling well and you are not experiencing any problems, there is no need to return our call.  We will assume that you have returned to your regular daily activities without incident.  If any biopsies were taken you will be contacted by phone or by letter within the next 1-3 weeks.  Please call us at [phone] if you have not heard about the biopsies in 3 weeks.    SIGNATURES/CONFIDENTIALITY: You and/or your care partner have signed paperwork which will be entered into your electronic medical record.  These signatures attest to the fact that that the information above on your After Visit Summary has been reviewed   and is understood.  Full responsibility of the confidentiality of this discharge information lies with you and/or your care-partner.     Handouts were given to your care partner on polyps, diverticulosis, and a high fiber diet with liberal fluid intake. Avid aspirin and all NSAIDs. You may resume your other current medications today. Continue Nadol 40 mg daily.  Rx sent to your pharmacy at [place]. Repeat CBC, CMP and INR today. Await biopsy results. Please call if any questions or concerns.

## 2015-10-07 NOTE — Progress Notes (Signed)
No complaints on discharge.  Pt to lab for blood work on discharge.  maw

## 2015-10-07 NOTE — Progress Notes (Signed)
Transferred to recovery room. A/O x3, pleased with MAC.  VSS.  Report to Annette, RN. 

## 2015-10-07 NOTE — Op Note (Signed)
[place]  [address]. [place], [address]   COLONOSCOPY PROCEDURE REPORT  PATIENT: Thomas [name]  MR#: [id number] BIRTHDATE: 1961-11-14 , 20  yrs. old GENDER: male ENDOSCOPIST: [name], MD PROCEDURE DATE:  10/07/2015 PROCEDURE:   Colonoscopy, screening, Colonoscopy with snare polypectomy, and Colonoscopy with hot biopsy/bipolar First Screening Colonoscopy - Avg.  risk and is 50 yrs.  old or older Yes.  Prior Negative Screening - Now for repeat screening. N/A  History of Adenoma - Now for follow-up colonoscopy & has been > or = to 3 yrs.  N/A  Polyps removed today? Yes ASA CLASS:   Class III INDICATIONS:Screening for colonic neoplasia, Colorectal Neoplasm Risk Assessment for this procedure is average risk, and 1st colonoscopy. MEDICATIONS: Monitored anesthesia care, this was the total dose used for all procedures at this session, and Propofol 400 mg IV  DESCRIPTION OF PROCEDURE:   After the risks benefits and alternatives of the procedure were thoroughly explained, informed consent was obtained.  The digital rectal exam revealed no rectal mass.   The LB [id number]  endoscope was introduced through the anus and advanced to the cecum, which was identified by both the appendix and ileocecal valve. No adverse events experienced. The quality of the prep was good.  (Suprep was used)  The instrument was then slowly withdrawn as the colon was fully examined. Estimated blood loss is zero unless otherwise noted in this procedure report.      COLON FINDINGS: A sessile polyp measuring 4 mm in size was found at the cecum.  A polypectomy was performed with cold forceps.  The resection was complete, the polyp tissue was completely retrieved and sent to histology.   A sessile polyp measuring 5 mm in size was found in the rectum.  A polypectomy was performed with a cold snare.  The resection was complete, the polyp tissue was completely retrieved and sent to  histology.  Persistent oozing at the site was controlled using hemoclips.  One (1) placement was made.   A semi-pedunculated polyp measuring 7 mm in size was found in the rectum.  A polypectomy was performed using snare cautery.  The resection was complete, the polyp tissue was completely retrieved and sent to histology.   There was moderate diverticulosis noted in the descending colon and sigmoid colon.  Retroflexed views revealed internal hemorrhoids. The time to cecum = 4.4 Withdrawal time = 14.7   The scope was withdrawn and the procedure completed.  COMPLICATIONS: There were no immediate complications.  ENDOSCOPIC IMPRESSION: 1.   Sessile polyp was found at the cecum; polypectomy was performed with cold forceps 2.   Sessile polyp was found in the rectum; polypectomy was performed with a cold snare; bleeding at the site was controlled using hemoclip x 1 3.   Semi-pedunculated polyp was found in the rectum; polypectomy was performed using snare cautery 4.   Moderate diverticulosis was noted in the descending colon and sigmoid colon  RECOMMENDATIONS: 1.  Avoid Aspirin and all other NSAIDs 2.  Await pathology results 3.  High fiber diet 4.  Repeat Colonoscopy in 3 years. 5.  You will receive a letter within 1-2 weeks with the results of your biopsy as well as final recommendations.  Please call my office if you have not received a letter after 3 weeks.  eSigned:  [name], MD 10/07/2015 9:05 AM   cc: [name], MD and The Patient   PATIENT NAME:  Thomas [name], Thomas [name] MR#: [id number]

## 2015-10-07 NOTE — Progress Notes (Signed)
Called to room to assist during endoscopic procedure.  Patient ID and intended procedure confirmed with present staff. Received instructions for my participation in the procedure from the performing physician.  

## 2015-10-08 ENCOUNTER — Telehealth: Payer: Self-pay | Admitting: *Deleted

## 2015-10-08 NOTE — Telephone Encounter (Signed)
  Follow up Call-  Call back number 10/07/2015  Post procedure Call Back phone  # [phone]  Permission to leave phone message Yes     Patient questions:  Do you have a fever, pain , or abdominal swelling? No. Pain Score  0 *  Have you tolerated food without any problems? Yes.    Have you been able to return to your normal activities? Yes.    Do you have any questions about your discharge instructions: Diet   No. Medications  No. Follow up visit  No.  Do you have questions or concerns about your Care? No.  Actions: * If pain score is 4 or above: No action needed, pain <4.

## 2015-10-11 ENCOUNTER — Other Ambulatory Visit: Payer: Self-pay

## 2015-10-11 DIAGNOSIS — R945 Abnormal results of liver function studies: Principal | ICD-10-CM

## 2015-10-11 DIAGNOSIS — R7989 Other specified abnormal findings of blood chemistry: Secondary | ICD-10-CM

## 2015-10-12 ENCOUNTER — Telehealth: Payer: Self-pay | Admitting: Internal Medicine

## 2015-10-12 NOTE — Telephone Encounter (Signed)
Patient wife called in regarding this. She states that patient insurance company will not cover rx how this is written. Best [phone]

## 2015-10-13 ENCOUNTER — Encounter: Payer: Self-pay | Admitting: Internal Medicine

## 2015-10-18 ENCOUNTER — Encounter: Payer: Self-pay | Admitting: *Deleted

## 2015-10-29 ENCOUNTER — Telehealth: Payer: Self-pay

## 2015-10-29 NOTE — Telephone Encounter (Signed)
Pt aware.

## 2015-10-29 NOTE — Telephone Encounter (Signed)
-----   Message from Algernon Huxley, RN sent at 10/11/2015  9:02 AM EDT ----- Regarding: Labs Pt needs labs done, orders in epic.

## 2015-11-08 MED ORDER — PANTOPRAZOLE SODIUM 40 MG PO TBEC: 40.0000 mg | DELAYED_RELEASE_TABLET | Freq: Two times a day (BID) | ORAL | Status: DC

## 2015-11-08 MED ORDER — SUCRALFATE 1 GM/10ML PO SUSP: 1.0000 g | Freq: Three times a day (TID) | ORAL | Status: DC

## 2015-11-08 NOTE — Telephone Encounter (Signed)
Patient is apparently having significant reflux on once daily dosing of pantoprazole as apparently twice daily dosing was never authorized by insurance. Per Dr Vena Rua verbal instruction, patient may start twice daily pantoprazole (we will get insurance approval) and add carafate 10 ml tid x 2 weeks. Rx's sent to pharmacy.

## 2015-11-08 NOTE — Telephone Encounter (Signed)
Once daily should be sufficient

## 2015-11-08 NOTE — Telephone Encounter (Signed)
Dr Hilarie Fredrickson- Please advise.... Last office note 09/15/15 indicates that patient is to increase pantoprazole to bid dosing. However, endo report from 10/07/15 indicates that patient is to remain on once daily dosing. Should he be on qd or bid dosing?

## 2015-11-08 NOTE — Addendum Note (Signed)
Addended by: Larina Bras on: 11/08/2015 05:18 PM   Modules accepted: Orders

## 2015-11-08 NOTE — Telephone Encounter (Signed)
Patient wife calling in regarding this. She states that the patient is now out and that his insurance will not cover for two pills daily. That they will only cover one pill daily. Best # [phone]

## 2015-11-09 ENCOUNTER — Encounter: Payer: Self-pay | Admitting: *Deleted

## 2015-11-09 NOTE — Telephone Encounter (Signed)
Per Zack @ [facility] [employer] (phone [phone]), patients pantoprazole 40 mg twice daily has been approved for the lifetime of the plan. [phone]. Patient's wife advised.

## 2015-11-16 ENCOUNTER — Ambulatory Visit: Payer: BLUE CROSS/BLUE SHIELD | Admitting: Internal Medicine

## 2015-12-05 ENCOUNTER — Other Ambulatory Visit: Payer: Self-pay | Admitting: Internal Medicine

## 2015-12-13 ENCOUNTER — Other Ambulatory Visit: Payer: Self-pay | Admitting: Internal Medicine

## 2015-12-17 ENCOUNTER — Other Ambulatory Visit: Payer: Self-pay | Admitting: Internal Medicine

## 2016-01-08 ENCOUNTER — Other Ambulatory Visit: Payer: Self-pay | Admitting: Internal Medicine

## 2016-01-29 ENCOUNTER — Other Ambulatory Visit: Payer: Self-pay | Admitting: Internal Medicine

## 2016-02-09 DIAGNOSIS — D649 Anemia, unspecified: Secondary | ICD-10-CM

## 2016-02-09 DIAGNOSIS — D696 Thrombocytopenia, unspecified: Secondary | ICD-10-CM

## 2016-02-09 HISTORY — DX: Thrombocytopenia, unspecified: D69.6

## 2016-02-09 HISTORY — DX: Anemia, unspecified: D64.9

## 2016-03-05 ENCOUNTER — Inpatient Hospital Stay (HOSPITAL_COMMUNITY)
Admission: EM | Admit: 2016-03-05 | Discharge: 2016-03-07 | DRG: 378 | Disposition: A | Discharge status: Home / Self Care | Payer: BLUE CROSS/BLUE SHIELD | Attending: Internal Medicine | Admitting: Internal Medicine

## 2016-03-05 ENCOUNTER — Encounter (HOSPITAL_COMMUNITY): Payer: Self-pay | Admitting: Emergency Medicine

## 2016-03-05 ENCOUNTER — Encounter (HOSPITAL_COMMUNITY)
Admission: EM | Disposition: A | Discharge status: Home / Self Care | Payer: Self-pay | Source: Home / Self Care | Attending: Internal Medicine

## 2016-03-05 DIAGNOSIS — K7031 Alcoholic cirrhosis of liver with ascites: Secondary | ICD-10-CM | POA: Diagnosis present

## 2016-03-05 DIAGNOSIS — K766 Portal hypertension: Secondary | ICD-10-CM | POA: Diagnosis present

## 2016-03-05 DIAGNOSIS — K922 Gastrointestinal hemorrhage, unspecified: Secondary | ICD-10-CM | POA: Diagnosis present

## 2016-03-05 DIAGNOSIS — I8510 Secondary esophageal varices without bleeding: Secondary | ICD-10-CM | POA: Diagnosis present

## 2016-03-05 DIAGNOSIS — K760 Fatty (change of) liver, not elsewhere classified: Secondary | ICD-10-CM | POA: Diagnosis present

## 2016-03-05 DIAGNOSIS — K7030 Alcoholic cirrhosis of liver without ascites: Secondary | ICD-10-CM | POA: Diagnosis present

## 2016-03-05 DIAGNOSIS — D696 Thrombocytopenia, unspecified: Secondary | ICD-10-CM | POA: Diagnosis present

## 2016-03-05 DIAGNOSIS — Z79899 Other long term (current) drug therapy: Secondary | ICD-10-CM

## 2016-03-05 DIAGNOSIS — D539 Nutritional anemia, unspecified: Secondary | ICD-10-CM | POA: Diagnosis present

## 2016-03-05 DIAGNOSIS — K921 Melena: Secondary | ICD-10-CM | POA: Diagnosis present

## 2016-03-05 DIAGNOSIS — F1010 Alcohol abuse, uncomplicated: Secondary | ICD-10-CM | POA: Diagnosis present

## 2016-03-05 DIAGNOSIS — Z833 Family history of diabetes mellitus: Secondary | ICD-10-CM | POA: Diagnosis not present

## 2016-03-05 DIAGNOSIS — Z83511 Family history of glaucoma: Secondary | ICD-10-CM | POA: Diagnosis not present

## 2016-03-05 DIAGNOSIS — K7290 Hepatic failure, unspecified without coma: Secondary | ICD-10-CM | POA: Diagnosis present

## 2016-03-05 DIAGNOSIS — K3189 Other diseases of stomach and duodenum: Secondary | ICD-10-CM | POA: Diagnosis present

## 2016-03-05 DIAGNOSIS — I10 Essential (primary) hypertension: Secondary | ICD-10-CM | POA: Diagnosis present

## 2016-03-05 DIAGNOSIS — R4182 Altered mental status, unspecified: Secondary | ICD-10-CM | POA: Diagnosis present

## 2016-03-05 DIAGNOSIS — K219 Gastro-esophageal reflux disease without esophagitis: Secondary | ICD-10-CM | POA: Diagnosis present

## 2016-03-05 DIAGNOSIS — K7210 Chronic hepatic failure without coma: Secondary | ICD-10-CM | POA: Insufficient documentation

## 2016-03-05 DIAGNOSIS — Z820 Family history of epilepsy and other diseases of the nervous system: Secondary | ICD-10-CM

## 2016-03-05 DIAGNOSIS — D62 Acute posthemorrhagic anemia: Secondary | ICD-10-CM | POA: Diagnosis present

## 2016-03-05 DIAGNOSIS — D684 Acquired coagulation factor deficiency: Secondary | ICD-10-CM | POA: Diagnosis present

## 2016-03-05 DIAGNOSIS — D500 Iron deficiency anemia secondary to blood loss (chronic): Secondary | ICD-10-CM | POA: Diagnosis present

## 2016-03-05 DIAGNOSIS — R278 Other lack of coordination: Secondary | ICD-10-CM | POA: Diagnosis present

## 2016-03-05 DIAGNOSIS — D689 Coagulation defect, unspecified: Secondary | ICD-10-CM | POA: Diagnosis present

## 2016-03-05 DIAGNOSIS — N179 Acute kidney failure, unspecified: Secondary | ICD-10-CM | POA: Diagnosis present

## 2016-03-05 DIAGNOSIS — E785 Hyperlipidemia, unspecified: Secondary | ICD-10-CM | POA: Diagnosis present

## 2016-03-05 DIAGNOSIS — I8500 Esophageal varices without bleeding: Secondary | ICD-10-CM | POA: Diagnosis present

## 2016-03-05 DIAGNOSIS — K7460 Unspecified cirrhosis of liver: Secondary | ICD-10-CM

## 2016-03-05 DIAGNOSIS — R600 Localized edema: Secondary | ICD-10-CM | POA: Diagnosis not present

## 2016-03-05 DIAGNOSIS — K7682 Hepatic encephalopathy: Secondary | ICD-10-CM | POA: Diagnosis present

## 2016-03-05 HISTORY — DX: Anemia, unspecified: D64.9

## 2016-03-05 HISTORY — PX: ESOPHAGOGASTRODUODENOSCOPY: SHX5428

## 2016-03-05 LAB — CBC WITH DIFFERENTIAL/PLATELET
Basophils Absolute: 0 10*3/uL (ref 0.0–0.1)
Basophils Relative: 0 %
Eosinophils Absolute: 0.2 10*3/uL (ref 0.0–0.7)
Eosinophils Relative: 2 %
HEMATOCRIT: 26.7 % — AB (ref 39.0–52.0)
Hemoglobin: 9.1 g/dL — ABNORMAL LOW (ref 13.0–17.0)
Lymphocytes Relative: 18 %
Lymphs Abs: 1.8 10*3/uL (ref 0.7–4.0)
MCH: 35.1 pg — ABNORMAL HIGH (ref 26.0–34.0)
MCHC: 34.1 g/dL (ref 30.0–36.0)
MCV: 103.1 fL — AB (ref 78.0–100.0)
MONO ABS: 1.9 10*3/uL — AB (ref 0.1–1.0)
MONOS PCT: 19 %
NEUTROS PCT: 61 %
Neutro Abs: 5.9 10*3/uL (ref 1.7–7.7)
PLATELETS: 107 10*3/uL — AB (ref 150–400)
RBC: 2.59 MIL/uL — AB (ref 4.22–5.81)
RDW: 17.2 % — ABNORMAL HIGH (ref 11.5–15.5)
WBC: 9.8 10*3/uL (ref 4.0–10.5)

## 2016-03-05 LAB — COMPREHENSIVE METABOLIC PANEL
ALT: 25 U/L (ref 17–63)
AST: 92 U/L — AB (ref 15–41)
Albumin: 2.3 g/dL — ABNORMAL LOW (ref 3.5–5.0)
Alkaline Phosphatase: 152 U/L — ABNORMAL HIGH (ref 38–126)
Anion gap: 10 (ref 5–15)
BUN: 31 mg/dL — AB (ref 6–20)
CHLORIDE: 98 mmol/L — AB (ref 101–111)
CO2: 34 mmol/L — ABNORMAL HIGH (ref 22–32)
CREATININE: 1.53 mg/dL — AB (ref 0.61–1.24)
Calcium: 8.2 mg/dL — ABNORMAL LOW (ref 8.9–10.3)
GFR calc Af Amer: 58 mL/min — ABNORMAL LOW (ref >60)
GFR, EST NON AFRICAN AMERICAN: 50 mL/min — AB (ref >60)
GLUCOSE: 109 mg/dL — AB (ref 65–99)
Potassium: 4 mmol/L (ref 3.5–5.1)
Sodium: 142 mmol/L (ref 135–145)
Total Bilirubin: 4.3 mg/dL — ABNORMAL HIGH (ref 0.3–1.2)
Total Protein: 6.7 g/dL (ref 6.5–8.1)

## 2016-03-05 LAB — APTT: aPTT: 42 seconds — ABNORMAL HIGH (ref 24–37)

## 2016-03-05 LAB — URINALYSIS, ROUTINE W REFLEX MICROSCOPIC
Bilirubin Urine: NEGATIVE
Glucose, UA: NEGATIVE mg/dL
Hgb urine dipstick: NEGATIVE
KETONES UR: NEGATIVE mg/dL
LEUKOCYTES UA: NEGATIVE
NITRITE: NEGATIVE
PH: 7.5 (ref 5.0–8.0)
PROTEIN: NEGATIVE mg/dL
Specific Gravity, Urine: 1.018 (ref 1.005–1.030)

## 2016-03-05 LAB — HEMOGLOBIN: Hemoglobin: 9.1 g/dL — ABNORMAL LOW (ref 13.0–17.0)

## 2016-03-05 LAB — HEMATOCRIT: HCT: 27.1 % — ABNORMAL LOW (ref 39.0–52.0)

## 2016-03-05 LAB — I-STAT CG4 LACTIC ACID, ED: LACTIC ACID, VENOUS: 2.13 mmol/L — AB (ref 0.5–2.0)

## 2016-03-05 LAB — POC OCCULT BLOOD, ED: FECAL OCCULT BLD: POSITIVE — AB

## 2016-03-05 LAB — TYPE AND SCREEN
ABO/RH(D): A POS
ANTIBODY SCREEN: NEGATIVE

## 2016-03-05 LAB — PROTIME-INR
INR: 2.47 — AB (ref 0.00–1.49)
PROTHROMBIN TIME: 25.6 s — AB (ref 11.6–15.2)

## 2016-03-05 LAB — AMMONIA: Ammonia: 80 umol/L — ABNORMAL HIGH (ref 9–35)

## 2016-03-05 LAB — ABO/RH: ABO/RH(D): A POS

## 2016-03-05 SURGERY — EGD (ESOPHAGOGASTRODUODENOSCOPY)
Anesthesia: Moderate Sedation

## 2016-03-05 MED ORDER — NADOLOL 40 MG PO TABS
40.0000 mg | ORAL_TABLET | Freq: Every day | ORAL | Status: DC
  Administered 2016-03-05 – 2016-03-07 (×3): 40 mg via ORAL
  Filled 2016-03-05 (×3): qty 1

## 2016-03-05 MED ORDER — CALCIUM CARBONATE ANTACID 500 MG PO CHEW
1.0000 | CHEWABLE_TABLET | Freq: Every day | ORAL | Status: DC
  Administered 2016-03-05 – 2016-03-07 (×3): 200 mg via ORAL
  Filled 2016-03-05 (×4): qty 1

## 2016-03-05 MED ORDER — ACETAMINOPHEN 325 MG PO TABS: 650.0000 mg | ORAL_TABLET | Freq: Four times a day (QID) | ORAL | Status: DC | PRN

## 2016-03-05 MED ORDER — BUTAMBEN-TETRACAINE-BENZOCAINE 2-2-14 % EX AERO
INHALATION_SPRAY | CUTANEOUS | Status: DC | PRN
  Administered 2016-03-05: 2 via TOPICAL

## 2016-03-05 MED ORDER — MIDAZOLAM HCL 5 MG/ML IJ SOLN
INTRAMUSCULAR | Status: AC
  Filled 2016-03-05: qty 2

## 2016-03-05 MED ORDER — FENTANYL CITRATE (PF) 100 MCG/2ML IJ SOLN
INTRAMUSCULAR | Status: AC
  Filled 2016-03-05: qty 2

## 2016-03-05 MED ORDER — ACETAMINOPHEN 650 MG RE SUPP: 650.0000 mg | Freq: Four times a day (QID) | RECTAL | Status: DC | PRN

## 2016-03-05 MED ORDER — MIDAZOLAM HCL 10 MG/2ML IJ SOLN
INTRAMUSCULAR | Status: DC | PRN
  Administered 2016-03-05: 1 mg via INTRAVENOUS
  Administered 2016-03-05: 2 mg via INTRAVENOUS

## 2016-03-05 MED ORDER — OCTREOTIDE ACETATE 500 MCG/ML IJ SOLN
50.0000 ug/h | INTRAMUSCULAR | Status: DC
  Administered 2016-03-05 – 2016-03-06 (×3): 50 ug/h via INTRAVENOUS
  Filled 2016-03-05 (×5): qty 1

## 2016-03-05 MED ORDER — SODIUM CHLORIDE 0.9 % IV SOLN
INTRAVENOUS | Status: DC
  Administered 2016-03-05 – 2016-03-06 (×2): 100 mL/h via INTRAVENOUS

## 2016-03-05 MED ORDER — DIPHENHYDRAMINE HCL 50 MG/ML IJ SOLN
INTRAMUSCULAR | Status: AC
  Filled 2016-03-05: qty 1

## 2016-03-05 MED ORDER — ONDANSETRON HCL 4 MG PO TABS: 4.0000 mg | ORAL_TABLET | Freq: Four times a day (QID) | ORAL | Status: DC | PRN

## 2016-03-05 MED ORDER — ONDANSETRON 4 MG PO TBDP: 4.0000 mg | ORAL_TABLET | Freq: Once | ORAL | Status: DC

## 2016-03-05 MED ORDER — ONDANSETRON HCL 4 MG/2ML IJ SOLN
4.0000 mg | INTRAMUSCULAR | Status: DC | PRN
  Administered 2016-03-05: 4 mg via INTRAVENOUS
  Filled 2016-03-05: qty 2

## 2016-03-05 MED ORDER — VITAMIN K1 10 MG/ML IJ SOLN
5.0000 mg | Freq: Once | INTRAMUSCULAR | Status: DC
  Filled 2016-03-05: qty 0.5

## 2016-03-05 MED ORDER — SODIUM CHLORIDE 0.9 % IV SOLN
80.0000 mg | Freq: Once | INTRAVENOUS | Status: AC
  Administered 2016-03-05: 80 mg via INTRAVENOUS
  Filled 2016-03-05: qty 80

## 2016-03-05 MED ORDER — FENTANYL CITRATE (PF) 100 MCG/2ML IJ SOLN
INTRAMUSCULAR | Status: DC | PRN
  Administered 2016-03-05: 25 ug via INTRAVENOUS

## 2016-03-05 MED ORDER — LACTULOSE 10 GM/15ML PO SOLN
10.0000 g | Freq: Two times a day (BID) | ORAL | Status: DC
  Filled 2016-03-05 (×3): qty 15

## 2016-03-05 MED ORDER — SODIUM CHLORIDE 0.9 % IV BOLUS (SEPSIS)
1000.0000 mL | Freq: Once | INTRAVENOUS | Status: AC
  Administered 2016-03-05: 1000 mL via INTRAVENOUS

## 2016-03-05 MED ORDER — ONDANSETRON HCL 4 MG/2ML IJ SOLN: 4.0000 mg | Freq: Four times a day (QID) | INTRAMUSCULAR | Status: DC | PRN

## 2016-03-05 MED ORDER — DEXTROSE 5 % IV SOLN
1.0000 g | Freq: Once | INTRAVENOUS | Status: DC
  Filled 2016-03-05: qty 10

## 2016-03-05 MED ORDER — PANTOPRAZOLE SODIUM 40 MG IV SOLR
40.0000 mg | Freq: Two times a day (BID) | INTRAVENOUS | Status: DC
  Administered 2016-03-05 – 2016-03-07 (×4): 40 mg via INTRAVENOUS
  Filled 2016-03-05 (×5): qty 40

## 2016-03-05 MED ORDER — LACTULOSE 10 GM/15ML PO SOLN
30.0000 g | Freq: Two times a day (BID) | ORAL | Status: DC
  Administered 2016-03-05: 10 g via ORAL
  Administered 2016-03-06 – 2016-03-07 (×3): 30 g via ORAL
  Filled 2016-03-05 (×5): qty 45

## 2016-03-05 MED ORDER — OCTREOTIDE LOAD VIA INFUSION
50.0000 ug | Freq: Once | INTRAVENOUS | Status: AC
Start: 2016-03-05 — End: 2016-03-05
  Administered 2016-03-05: 50 ug via INTRAVENOUS
  Filled 2016-03-05: qty 25

## 2016-03-05 NOTE — ED Notes (Signed)
Critical i stat result given to EDP Knott and RN.

## 2016-03-05 NOTE — ED Notes (Signed)
Nurse at bedside starting IV will collect labs 

## 2016-03-05 NOTE — H&P (Signed)
History and Physical:    Thomas [name]   [id number] DOB: [birth year] DOA: 03/05/2016  Referring MD/provider: Dr. Laneta Simmers PCP: [name], MD   Chief Complaint: Slow to respond, nausea, dark stools  History of Present Illness:   Thomas [name] is an 55 y.o. male with a PMH of alcoholic cirrhosis, esophageal varices, and portal hypertensive gastropathy who presents today at his wife's insistence when she noted that he was mildly confused and slow to respond this morning. She also reports that he had nausea but no frank vomiting. Over the past 2 days, his appetite has been diminished and she describes him as being tired and sleeping a lot. Over the past 24 hours, she has noticed his legs and ankle seem more swollen than usual. This morning, she reports he had a black/bloody stool. Upon initial evaluation in the ED, patient was noted to have a drop in his hemoglobin from his usual baseline of 13 to 9.1. His INR was 2.47, his BUN was 31 and his lactate was 2.1. Patient is otherwise alert and is able to provide some of the history, though most of the history is given by his wife as his ability to remember the exact details are limited.  ROS:   Review of Systems  Constitutional: Positive for malaise/fatigue. Negative for fever, chills and weight loss.  HENT: Negative for congestion and sore throat.   Eyes: Negative.   Respiratory: Positive for shortness of breath. Negative for cough and sputum production.   Cardiovascular: Positive for leg swelling. Negative for chest pain.  Gastrointestinal: Positive for heartburn, nausea, diarrhea, blood in stool and melena. Negative for vomiting and abdominal pain.  Genitourinary: Negative.   Musculoskeletal: Negative for myalgias, joint pain and falls.  Skin: Positive for itching.       H/O Eczema  Neurological: Positive for weakness. Negative for dizziness and headaches.       Unsteady on feet  Endo/Heme/Allergies:       Cold intolerance    Psychiatric/Behavioral:       H/O ETOH abuse     Past Medical History:   Past Medical History  Diagnosis Date  . Hypertension   . Hyperlipidemia   . Alcohol abuse   . Elevated LFTs   . Hepatic steatosis   . Portal hypertension ([place])   . Alcoholic cirrhosis ([place])   . Coagulopathy ([place])   . Thrombocytopenia ([place])   . Diverticulosis   . Esophageal varices ([place])   . Tubular adenoma of colon   . GERD (gastroesophageal reflux disease)   . Gastropathy     Past Surgical History:   Past Surgical History  Procedure Laterality Date  . Testicle prosthesis      Social History:   Social History   Social History  . Marital Status: Married    Spouse Name: Thomas [name]  . Number of Children: 3  . Years of Education: N/A   Occupational History  . Sales    Social History Main Topics  . Smoking status: Never Smoker   . Smokeless tobacco: Never Used  . Alcohol Use: No     Comment: Quit several months ago  . Drug Use: No  . Sexual Activity: Not on file   Other Topics Concern  . Not on file   Social History Narrative   Lives with wife.  Normally able to ambulate without assistance.    Family history:   Family History  Problem Relation Age of Onset  . Diabetes Father   .  Diabetes Paternal Aunt   . Parkinson's disease Father   . Glaucoma Mother     Allergies   Review of patient's allergies indicates no known allergies.  Current Medications:   Prior to Admission medications   Medication Sig Start Date End Date Taking? Authorizing Provider  bismuth subsalicylate (PEPTO BISMOL) 262 MG/15ML suspension Take 30 mLs by mouth every 6 (six) hours as needed for indigestion.   Yes Historical Provider, MD  calcium carbonate (TUMS EX) 750 MG chewable tablet Chew 1 tablet by mouth daily.   Yes Historical Provider, MD  furosemide (LASIX) 40 MG tablet Take 1 tablet (40 mg total) by mouth daily. 09/15/15  Yes [name], MD  Melatonin-Pyridoxine (MELATIN PO) Take 5 mg by mouth  daily.   Yes Historical Provider, MD  nadolol (CORGARD) 40 MG tablet Take 1 tablet (40 mg total) by mouth daily. 10/07/15  Yes [name], MD  pantoprazole (PROTONIX) 40 MG tablet Take 1 tablet (40 mg total) by mouth 2 (two) times daily. 11/08/15  Yes [name], MD  lisinopril (PRINIVIL,ZESTRIL) 20 MG tablet Take 1 tablet (20 mg total) by mouth daily. Patient not taking: Reported on 03/05/2016 09/15/15   [name], MD  ondansetron (ZOFRAN) 4 MG tablet Take 1 tablet (4 mg total) by mouth every 6 (six) hours as needed for nausea or vomiting. Patient not taking: Reported on 10/07/2015 09/16/15   [name], MD  spironolactone (ALDACTONE) 100 MG tablet Take 1 tablet (100 mg total) by mouth daily. Patient not taking: Reported on 03/05/2016 09/15/15   [name], MD  sucralfate (CARAFATE) 1 GM/10ML suspension Take 10 mLs (1 g total) by mouth 3 (three) times daily before meals. X 2 weeks Patient not taking: Reported on 03/05/2016 11/08/15   [name], MD    Physical Exam:   Filed Vitals:   03/05/16 1302 03/05/16 1417 03/05/16 1448 03/05/16 1521  BP: 158/80  148/72 138/67  Pulse: 75 76 72 77  Temp: 98.6 F (37 C)     TempSrc: Oral     Resp: 18 24 27 25   Height:      Weight:      SpO2: 94% 96% 98% 97%     Physical Exam: Blood pressure 138/67, pulse 77, temperature 98.6 F (37 C), temperature source Oral, resp. rate 25, height 5\' 6"  (1.676 m), weight 81.647 kg (180 lb), SpO2 97 %. Gen: No acute distress. Head: Normocephalic, atraumatic. Eyes: PERRL, EOMI, sclerae nonicteric. Mouth: Oropharynx clear with fair dentition. Neck: Supple, no thyromegaly, no lymphadenopathy, no jugular venous distention. Chest: Lungs are clear to auscultation bilaterally with no wheezes. CV: Heart sounds are regular. No murmurs, rubs, or gallops. Abdomen: Soft, nontender, mildly distended with normal active bowel sounds. Extremities: Extremities are 2+ edema bilaterally. Skin: Warm and dry. Neuro:  Alert and oriented times 3; grossly nonfocal. No asterixis. Slightly slow mentation. Psych: Mood and affect normal.   Data Review:    Labs: Basic Metabolic Panel:  Recent Labs Lab 03/05/16 1124  NA 142  K 4.0  CL 98*  CO2 34*  GLUCOSE 109*  BUN 31*  CREATININE 1.53*  CALCIUM 8.2*   Liver Function Tests:  Recent Labs Lab 03/05/16 1124  AST 92*  ALT 25  ALKPHOS 152*  BILITOT 4.3*  PROT 6.7  ALBUMIN 2.3*    Recent Labs Lab 03/05/16 1124  AMMONIA 80*   CBC:  Recent Labs Lab 03/05/16 1123  WBC 9.8  NEUTROABS 5.9  HGB 9.1*  HCT 26.7*  MCV 103.1*  PLT 107*    Radiographic Studies: No results found.  EKG: Not ordered.   Assessment/Plan:   Principal Problem:   Upper GI bleed - Given elevated BUN, GI bleed is felt to be from upper source. - Bleeding esophageal varices versus gastritis in the differential. - GI consultation requested with plans to proceed with EGD later this evening. - PPI therapy and octreotide ordered. - Monitor serial hemoglobin and transfuse if needed.  Active Problems:   Coagulopathy ([place]) - Likely secondary to advanced liver disease. Vitamin K ordered.    Alcoholic cirrhosis (HCC)/thrombocytopenia - No alcohol intake in several months per patient and his wife. - Continue supportive care.    Acute kidney injury ([place]) - Likely prerenal in etiology. Baseline creatinine 0.7-0.8. - Hydrate and monitor.    Hepatic encephalopathy (HCC) - Mildly symptomatic with elevated ammonia levels. We'll start lactulose.    Macrocytic anemia/acute blood loss anemia - Baseline hemoglobin appears to be around 13. Hemoglobin dropped likely due to acute blood loss. - Monitor hemoglobin and transfuse if needed.    Esophageal varices (HCC) - Continue nadolol. Empiric Rocephin ordered given GI bleed. For EGD.    DVT prophylaxis - SCDs ordered.  Code Status / Family Communication / Disposition Plan:   Code Status: Full. Family  Communication: Thomas [name] (wife) and daughter at the bedside. Disposition Plan: Home when stable.  Time spent: One hour.  RAMA,CHRISTINA [facility] Pager [phone] Cell: [phone]   If 7PM-7AM, please contact night-coverage [email or web address] Password [employer] 03/05/2016, 3:55 PM

## 2016-03-05 NOTE — Consult Note (Addendum)
Consult Note   Referring Provider: Leo Grosser, MD [facility] ED Primary Care Physician:  [name], MD Primary Gastroenterologist:  Dr. Marty Heck [facility] (former pt of Dr. Hilarie Fredrickson)  Reason for Consultation:  Melena, anemia  HPI: Thomas [name] is a 55 y.o. male with etoh cirrhosis, etoh abuse followed by Dr. Hilarie Fredrickson in 2016 and the patient transferred his care to Dr. Patsy [place]. Pt and pts wife claim he has had complete abstinence from etoh for several months. EGD in 09/2015 showed 3 columns of small esophageal varices and moderate portal hypertensive gastropathy. Colonoscopy in 09/2015 showed polyps and diverticulosis. He has noted about a 10 lb weight gain and abdominal swelling over past several weeks. His wife noted mild confusion and dark black stools with some redish tinge today and brought him to [facility] ED. Hb decreased to 9.1 from 13.5 in 09/2015. INR increased from 1.9 to 2.47. Ammonia=80. Denies ASA/NSAID usage.   Abd MRI 05/2015  1. Diffuse micro nodularity of the liver, with corresponding micronodular signal dropout on out of phase images along with some focal and geographic areas of signal dropout, favoring an unusual distribution of steatosis potentially in the setting of micronodular cirrhosis. The caudate lobe and lateral segment left hepatic lobe are borderline prominent which further raises suspicion for possible cirrhosis. Biopsy may be helpful in further workup. 2. Borderline splenomegaly. Splenorenal shunting noted, suggesting portal venous hypertension.   Past Medical History  Diagnosis Date  . Hypertension   . Hyperlipidemia   . Alcohol abuse   . Elevated LFTs   . Hepatic steatosis   . Portal hypertension ([place])   . Alcoholic cirrhosis ([place])   . Coagulopathy ([place])   . Thrombocytopenia ([place])   . Diverticulosis   . Esophageal varices ([place])   . Tubular adenoma of colon   . GERD (gastroesophageal reflux disease)   . Gastropathy     Past Surgical History  Procedure Laterality  Date  . Testicle prosthesis      Prior to Admission medications   Medication Sig Start Date End Date Taking? Authorizing Provider  bismuth subsalicylate (PEPTO BISMOL) 262 MG/15ML suspension Take 30 mLs by mouth every 6 (six) hours as needed for indigestion.   Yes Historical Provider, MD  calcium carbonate (TUMS EX) 750 MG chewable tablet Chew 1 tablet by mouth daily.   Yes Historical Provider, MD  furosemide (LASIX) 40 MG tablet Take 1 tablet (40 mg total) by mouth daily. 09/15/15  Yes [name], MD  Melatonin-Pyridoxine (MELATIN PO) Take 5 mg by mouth daily.   Yes Historical Provider, MD  nadolol (CORGARD) 40 MG tablet Take 1 tablet (40 mg total) by mouth daily. 10/07/15  Yes [name], MD  pantoprazole (PROTONIX) 40 MG tablet Take 1 tablet (40 mg total) by mouth 2 (two) times daily. 11/08/15  Yes [name], MD  lisinopril (PRINIVIL,ZESTRIL) 20 MG tablet Take 1 tablet (20 mg total) by mouth daily. Patient not taking: Reported on 03/05/2016 09/15/15   [name], MD  ondansetron (ZOFRAN) 4 MG tablet Take 1 tablet (4 mg total) by mouth every 6 (six) hours as needed for nausea or vomiting. Patient not taking: Reported on 10/07/2015 09/16/15   [name], MD  spironolactone (ALDACTONE) 100 MG tablet Take 1 tablet (100 mg total) by mouth daily. Patient not taking: Reported on 03/05/2016 09/15/15   [name], MD  sucralfate (CARAFATE) 1 GM/10ML suspension Take 10 mLs (1 g total) by mouth 3 (three) times daily  before meals. X 2 weeks Patient not taking: Reported on 03/05/2016 11/08/15   [name], MD    Current Facility-Administered Medications  Medication Dose Route Frequency Provider Last Rate Last Dose  . octreotide (SANDOSTATIN) 500 mcg in sodium chloride 0.9 % 250 mL (2 mcg/mL) infusion  50 mcg/hr Intravenous Continuous Leo Grosser, MD 25 mL/hr at 03/05/16 1449 50 mcg/hr at 03/05/16 1449  . ondansetron (ZOFRAN) injection 4 mg  4 mg Intravenous Q1H PRN Leo Grosser, MD   4 mg at  03/05/16 1506   Current Outpatient Prescriptions  Medication Sig Dispense Refill  . bismuth subsalicylate (PEPTO BISMOL) 262 MG/15ML suspension Take 30 mLs by mouth every 6 (six) hours as needed for indigestion.    . calcium carbonate (TUMS EX) 750 MG chewable tablet Chew 1 tablet by mouth daily.    . furosemide (LASIX) 40 MG tablet Take 1 tablet (40 mg total) by mouth daily. 30 tablet 2  . Melatonin-Pyridoxine (MELATIN PO) Take 5 mg by mouth daily.    . nadolol (CORGARD) 40 MG tablet Take 1 tablet (40 mg total) by mouth daily. 30 tablet 5  . pantoprazole (PROTONIX) 40 MG tablet Take 1 tablet (40 mg total) by mouth 2 (two) times daily. 60 tablet 3  . lisinopril (PRINIVIL,ZESTRIL) 20 MG tablet Take 1 tablet (20 mg total) by mouth daily. (Patient not taking: Reported on 03/05/2016) 30 tablet 2  . ondansetron (ZOFRAN) 4 MG tablet Take 1 tablet (4 mg total) by mouth every 6 (six) hours as needed for nausea or vomiting. (Patient not taking: Reported on 10/07/2015) 30 tablet 1  . spironolactone (ALDACTONE) 100 MG tablet Take 1 tablet (100 mg total) by mouth daily. (Patient not taking: Reported on 03/05/2016) 30 tablet 2  . sucralfate (CARAFATE) 1 GM/10ML suspension Take 10 mLs (1 g total) by mouth 3 (three) times daily before meals. X 2 weeks (Patient not taking: Reported on 03/05/2016) 500 mL 0    Allergies as of 03/05/2016  . (No Known Allergies)    Family History  Problem Relation Age of Onset  . Diabetes Father   . Diabetes Paternal Aunt   . Parkinson's disease Father   . Glaucoma Mother     Social History   Social History  . Marital Status: Married    Spouse Name: Lelan Pons  . Number of Children: 3  . Years of Education: N/A   Occupational History  . Sales    Social History Main Topics  . Smoking status: Never Smoker   . Smokeless tobacco: Never Used  . Alcohol Use: No     Comment: Quit several months ago  . Drug Use: No  . Sexual Activity: Not on file   Other Topics Concern    . Not on file   Social History Narrative   Lives with wife.  Normally able to ambulate without assistance.    Review of Systems: Gen: Denies any fever, chills, sweats, anorexia, fatigue, weakness, malaise, weight loss, and sleep disorder CV: Denies chest pain, angina, palpitations, syncope, orthopnea, PND, and claudication. Resp: Denies dyspnea at rest, dyspnea with exercise, cough, sputum, wheezing, coughing up blood, and pleurisy. GI: Denies vomiting blood, jaundice, and fecal incontinence.   Denies dysphagia or odynophagia. GU : Denies urinary burning, blood in urine, urinary frequency, urinary hesitancy, nocturnal urination, and urinary incontinence. MS: Denies joint pain, limitation of movement, stiffness, low back pain, extremity pain. Denies muscle weakness, cramps, atrophy.  Derm: Denies rash, itching, dry skin, hives, moles, warts,  or unhealing ulcers.  Psych: Denies depression, anxiety, suicidal ideation, hallucinations, paranoia. Heme: Denies bruising, bleeding, and enlarged lymph nodes. Neuro:  Denies any headaches, dizziness, paresthesias. Endo:  Denies any problems with DM, thyroid, adrenal function.  Physical Exam: Vital signs in last 24 hours: Temp:  [97.6 F (36.4 C)-98.6 F (37 C)] 98.6 F (37 C) (03/26 1302) Pulse Rate:  [72-76] 72 (03/26 1448) Resp:  [18-27] 27 (03/26 1448) BP: (140-158)/(72-81) 148/72 mmHg (03/26 1448) SpO2:  [94 %-98 %] 98 % (03/26 1448) Weight:  [180 lb (81.647 kg)] 180 lb (81.647 kg) (03/26 1025)    General:  Alert, well-developed, well-nourished, in NAD Head:  Normocephalic and atraumatic. Eyes:  Sclera clear, bilateral sclera icterus. Conjunctiva pink. Ears:  Normal auditory acuity. Nose:  No deformity, discharge, or lesions. Mouth:  No deformity or lesions. Oropharynx pink & moist. Neck:  Supple; no masses or thyromegaly. Chest:  Clear throughout to auscultation. No wheezes, crackles, or rhonchi. No acute distress. Heart:  Regular  rate and rhythm; no murmurs, clicks, rubs, or gallops. Abdomen:  Soft, nontender and mildly distended. Possible fluid wave. No masses, hepatosplenomegaly or hernias noted. Normal bowel sounds, without guarding, and without rebound.   Rectal:  Melena per EDP.   Msk:  Symmetrical without gross deformities. Normal posture. Pulses:  Normal pulses noted. Extremities:  Without clubbing. 2+ ankle and pretibial edema Neurologic:  Alert and  oriented x4;  grossly normal neurologically. Slow mentation.No asterixis.  Skin:  Intact without significant lesions or rashes. Cervical Nodes:  No significant cervical adenopathy. Psych:  Alert and cooperative. Normal mood and affect.   Intake/Output from previous day:   Intake/Output this shift:    Lab Results:  Recent Labs  03/05/16 1123  WBC 9.8  HGB 9.1*  HCT 26.7*  PLT 107*   BMET  Recent Labs  03/05/16 1124  NA 142  K 4.0  CL 98*  CO2 34*  GLUCOSE 109*  BUN 31*  CREATININE 1.53*  CALCIUM 8.2*   LFT  Recent Labs  03/05/16 1124  PROT 6.7  ALBUMIN 2.3*  AST 92*  ALT 25  ALKPHOS 152*  BILITOT 4.3*   PT/INR  Recent Labs  03/05/16 1124  LABPROT 25.6*  INR 2.47*    Previous Endoscopies: See HPI  Impression/ Recommendations:  1. Suspected UGI bleed with melena with increased BUN. History of portal hypertensive gastropathy and esophageal varices-both are likely etiologies of acute bleed. R/O ulcer, etc. Octreotide IV infusion, PPI IV infusion, trend CBC. EGD today. The risks (including bleeding, perforation, infection, missed lesions, medication reactions and possible hospitalization or surgery if complications occur), benefits, and alternatives to endoscopy with possible biopsy and possible dilation were discussed with the patient and they consent to proceed. Rocephin IV daily.    2. ABL anemia. Secondary to 1. Transfuse to keep Hb > 8.   3. Decompensated etoh cirrhosis, clinically has new ascites and peripheral edema.  MELD is 26 today. Vit K for coagulopathy although increased INR likely due to underlying liver disease. Schedule Abd [place] tomorrow to assess for ascites. Resume Nadolol when pt has stablized to reduce portal pressures.   4. Mild HE. Begin lactulose and trend ammonia.     LOS: 0 days   Brendyn [facility] T. Fuller Plan MD 03/05/2016, 3:22 PM [id number] Mon-Fri 8a-5p  [id number] after 5p, weekends, holidays

## 2016-03-05 NOTE — Op Note (Signed)
[facility] Patient Name: Thomas [name] Procedure Date: 03/05/2016 MRN: [id number] Attending MD: Ladene Artist , MD Date of Birth: May 28, 1961 CSN:  Age: 55 Admit Type: Inpatient Procedure:                Upper GI endoscopy Indications:              Suspected upper gastrointestinal bleeding, Melena Providers:                Pricilla Riffle. Fuller Plan, MD, Malka So, RN, Alfonso Patten, Technician Referring MD:              Medicines:                Fentanyl 25 micrograms IV, Midazolam 3 mg IV,                            Cetacaine spray Complications:            No immediate complications. Estimated Blood Loss:     Estimated blood loss: none. Procedure:                Pre-Anesthesia Assessment:                           - Prior to the procedure, a History and Physical                            was performed, and patient medications and                            allergies were reviewed. The patient's tolerance of                            previous anesthesia was also reviewed. The risks                            and benefits of the procedure and the sedation                            options and risks were discussed with the patient.                            All questions were answered, and informed consent                            was obtained. Prior Anticoagulants: The patient has                            taken no previous anticoagulant or antiplatelet                            agents. ASA Grade Assessment: III - A patient with  severe systemic disease. After reviewing the risks                            and benefits, the patient was deemed in                            satisfactory condition to undergo the procedure.                           After obtaining informed consent, the endoscope was                            passed under direct vision. Throughout the                            procedure, the  patient's blood pressure, pulse, and                            oxygen saturations were monitored continuously. The                            [id number] scope was introduced through the                            mouth, and advanced to the second part of duodenum.                            The upper GI endoscopy was accomplished without                            difficulty. The patient tolerated the procedure                            well. Scope In: Scope Out: Findings:      Three columns of non-bleeding grade I varices were found in the lower       third of the esophagus. No stigmata of recent bleeding were evident and       no red wale signs were present.      The exam of the esophagus was otherwise normal.      Moderate portal hypertensive gastropathy was found in the entire       examined stomach.      The exam of the stomach was otherwise normal.      The cardia and gastric fundus were normal on retroflexion.      The duodenal bulb and second portion of the duodenum were normal. Impression:               - Non-bleeding Grade I esophageal varices                           - Moderate portal hypertensive gastropathy Moderate Sedation:      Moderate (conscious) sedation was administered by the endoscopy nurse       and supervised by the endoscopist. The following parameters were       monitored: oxygen saturation, heart rate, blood pressure, respiratory  rate, EKG, adequacy of pulmonary ventilation, and response to care.       Total physician intraservice time was 14 minutes. Recommendation:           - Return patient to hospital ward for ongoing care.                           - Clear liquid diet.                           - Continue octreotide infusion of 50 micrograms per                            hour.                           - Continue present medications. Procedure Code(s):        --- Professional ---                           [phone],  Esophagogastroduodenoscopy, flexible,                            transoral; diagnostic, including collection of                            specimen(s) by brushing or washing, when performed                            (separate procedure)                           [id number], Moderate sedation services provided by the                            same physician or other qualified health care                            professional performing the diagnostic or                            therapeutic service that the sedation supports,                            requiring the presence of an independent trained                            observer to assist in the monitoring of the                            patient's level of consciousness and physiological                            status; initial 15 minutes of intraservice time,                            patient age 66  years or older Diagnosis Code(s):        --- Professional ---                           I85.00, Esophageal varices without bleeding CPT copyright 2016 [employer]. All rights reserved. The codes documented in this report are preliminary and upon coder review may  be revised to meet current compliance requirements. Ladene Artist, MD 03/05/2016 5:13:51 PM This report has been signed electronically. Number of Addenda: 0

## 2016-03-05 NOTE — Progress Notes (Signed)
Nurse called [facility]. Reached office recording, pressed 1 for on call physician. After ringing for a long period of time, reached automated recording explaining no one is available at this time. Did not give nurse the option to leave number for page or message. Nurse attempted to call twice and this happened both times. Nurse was calling to question ocreotide infusion. Nurse researched chart and ocreotide is to continue infusing.

## 2016-03-05 NOTE — ED Notes (Signed)
All belongings taken w/ Pt or taken by family.

## 2016-03-05 NOTE — Interval H&P Note (Signed)
History and Physical Interval Note:  03/05/2016 4:46 PM  Thomas [name]  has presented today for surgery, with the diagnosis of GI bleed  The various methods of treatment have been discussed with the patient and family. After consideration of risks, benefits and other options for treatment, the patient has consented to  Procedure(s): ESOPHAGOGASTRODUODENOSCOPY (EGD) (N/A) as a surgical intervention .  The patient's history has been reviewed, patient examined, no change in status, stable for surgery.  I have reviewed the patient's chart and labs.  Questions were answered to the patient's satisfaction.     Pricilla Riffle. Fuller Plan

## 2016-03-05 NOTE — ED Provider Notes (Signed)
CSN: [id number]     Arrival date & time 03/05/16  1017 History   First MD Initiated Contact with Patient 03/05/16 1036     Chief Complaint  Patient presents with  . Nausea     (Consider location/radiation/quality/duration/timing/severity/associated sxs/prior Treatment) Patient is a 55 y.o. male presenting with altered mental status. The history is provided by the spouse.  Altered Mental Status Presenting symptoms: behavior changes and confusion   Severity:  Moderate Most recent episode:  Today Episode history:  Continuous Timing:  Constant Progression:  Unchanged Chronicity:  New Context comment:  Liver disease Associated symptoms: no abdominal pain   Associated symptoms comment:  Difficulty ambulating and slowed   Past Medical History  Diagnosis Date  . Hypertension   . Hyperlipidemia   . Alcohol abuse   . Elevated LFTs   . Hepatic steatosis   . Portal hypertension ([facility])   . Alcoholic cirrhosis ([place])   . Coagulopathy ([place])   . Thrombocytopenia ([place])   . Diverticulosis   . Esophageal varices ([place])   . Tubular adenoma of colon   . GERD (gastroesophageal reflux disease)   . Gastropathy    Past Surgical History  Procedure Laterality Date  . Testicle prosthesis     Family History  Problem Relation Age of Onset  . Diabetes Father   . Diabetes Paternal Aunt   . Parkinson's disease Father    Social History  Substance Use Topics  . Smoking status: Never Smoker   . Smokeless tobacco: Never Used  . Alcohol Use: No     Comment: 2-3 beers per day    Review of Systems  Gastrointestinal: Positive for blood in stool. Negative for abdominal pain.  Psychiatric/Behavioral: Positive for confusion.  All other systems reviewed and are negative.     Allergies  Review of patient's allergies indicates no known allergies.  Home Medications   Prior to Admission medications   Medication Sig Start Date End Date Taking? Authorizing Provider  bismuth subsalicylate (PEPTO  BISMOL) 262 MG/15ML suspension Take 30 mLs by mouth every 6 (six) hours as needed for indigestion.   Yes Historical Provider, MD  calcium carbonate (TUMS EX) 750 MG chewable tablet Chew 1 tablet by mouth daily.   Yes Historical Provider, MD  furosemide (LASIX) 40 MG tablet Take 1 tablet (40 mg total) by mouth daily. 09/15/15  Yes [name], MD  Melatonin-Pyridoxine (MELATIN PO) Take 5 mg by mouth daily.   Yes Historical Provider, MD  nadolol (CORGARD) 40 MG tablet Take 1 tablet (40 mg total) by mouth daily. 10/07/15  Yes [name], MD  pantoprazole (PROTONIX) 40 MG tablet Take 1 tablet (40 mg total) by mouth 2 (two) times daily. 11/08/15  Yes [name], MD  lisinopril (PRINIVIL,ZESTRIL) 20 MG tablet Take 1 tablet (20 mg total) by mouth daily. Patient not taking: Reported on 03/05/2016 09/15/15   [name], MD  ondansetron (ZOFRAN) 4 MG tablet Take 1 tablet (4 mg total) by mouth every 6 (six) hours as needed for nausea or vomiting. Patient not taking: Reported on 10/07/2015 09/16/15   [name], MD  spironolactone (ALDACTONE) 100 MG tablet Take 1 tablet (100 mg total) by mouth daily. Patient not taking: Reported on 03/05/2016 09/15/15   [name], MD  sucralfate (CARAFATE) 1 GM/10ML suspension Take 10 mLs (1 g total) by mouth 3 (three) times daily before meals. X 2 weeks Patient not taking: Reported on 03/05/2016 11/08/15   Lajuan Lines Pyrtle,  MD   BP 158/80 mmHg  Pulse 75  Temp(Src) 98.6 F (37 C) (Oral)  Resp 18  Ht 5\' 6"  (1.676 m)  Wt 180 lb (81.647 kg)  BMI 29.07 kg/m2  SpO2 94% Physical Exam  Constitutional: He is oriented to person, place, and time. He appears well-developed and well-nourished. He appears listless. No distress.  HENT:  Head: Normocephalic and atraumatic.  Eyes: Right eye exhibits nystagmus. Left eye exhibits nystagmus.  Slight icteric sclera  Neck: Neck supple. No tracheal deviation present.  Cardiovascular: Normal rate and regular rhythm.   Pulmonary/Chest:  Effort normal. No respiratory distress.  Abdominal: Soft. He exhibits no distension.  Neurological: He is oriented to person, place, and time. He has normal strength. He appears listless. No cranial nerve deficit. Coordination normal. GCS eye subscore is 4. GCS verbal subscore is 5. GCS motor subscore is 6.  + asterixis  Skin: Skin is warm and dry.  Psychiatric: His affect is blunt. He is slowed.    ED Course  Procedures (including critical care time) Labs Review Labs Reviewed  CBC WITH DIFFERENTIAL/PLATELET - Abnormal; Notable for the following:    RBC 2.59 (*)    Hemoglobin 9.1 (*)    HCT 26.7 (*)    MCV 103.1 (*)    MCH 35.1 (*)    RDW 17.2 (*)    Platelets 107 (*)    Monocytes Absolute 1.9 (*)    All other components within normal limits  COMPREHENSIVE METABOLIC PANEL - Abnormal; Notable for the following:    Chloride 98 (*)    CO2 34 (*)    Glucose, Bld 109 (*)    BUN 31 (*)    Creatinine, Ser 1.53 (*)    Calcium 8.2 (*)    Albumin 2.3 (*)    AST 92 (*)    Alkaline Phosphatase 152 (*)    Total Bilirubin 4.3 (*)    GFR calc non Af Amer 50 (*)    GFR calc Af Amer 58 (*)    All other components within normal limits  AMMONIA - Abnormal; Notable for the following:    Ammonia 80 (*)    All other components within normal limits  APTT - Abnormal; Notable for the following:    aPTT 42 (*)    All other components within normal limits  PROTIME-INR - Abnormal; Notable for the following:    Prothrombin Time 25.6 (*)    INR 2.47 (*)    All other components within normal limits  I-STAT CG4 LACTIC ACID, ED - Abnormal; Notable for the following:    Lactic Acid, Venous 2.13 (*)    All other components within normal limits  POC OCCULT BLOOD, ED - Abnormal; Notable for the following:    Fecal Occult Bld POSITIVE (*)    All other components within normal limits  URINALYSIS, ROUTINE W REFLEX MICROSCOPIC (NOT AT [facility])  TYPE AND SCREEN    Imaging Review No results found. I have  personally reviewed and evaluated these images and lab results as part of my medical decision-making.   EKG Interpretation None      MDM   Final diagnoses:  Gastrointestinal hemorrhage, unspecified gastritis, unspecified gastrointestinal hemorrhage type  End stage liver disease ([place])  Hepatic encephalopathy ([place])    55 y.o. male presents with change in behavior today upon waking. Wife who is a very astute historian provided most of the history. He has history of alcohol cirrhosis, this appears to have progressed based on clinical  appearance of encephalopathy and worsening lab values. Wife saw brb in the stool this morning and brought him for evaluation after he was uncooperative and unable to walk appropriately. Positive occult and gross stool. Drop in Hb and elevated BUN suggestive of acute GI bleeding. Presumed variceal until proven otherwise, started on protonix and octreotide. [facility] consulted, agreed to scope later today. Hospitalist was consulted for admission and will see the patient in the emergency department.     Leo Grosser, MD 03/05/16 2245

## 2016-03-05 NOTE — H&P (View-Only) (Signed)
Consult Note   Referring Provider: Leo Grosser, MD [facility] ED Primary Care Physician:  [name], MD Primary Gastroenterologist:  Dr. Marty [facility] (former pt of Dr. Hilarie Fredrickson)  Reason for Consultation:  Melena, anemia  HPI: Thomas [name] is a 55 y.o. male with etoh cirrhosis, etoh abuse followed by Dr. Hilarie Fredrickson in 2016 and the patient transferred his care to Dr. Patsy [place]. Pt claims complete abstinence from etoh for several months. EGD in 09/2015 showed 3 columns of small esophageal varices and moderate portal hypertensive gastropathy.  Colonoscopy in 09/2015 showed polyps and diverticulosis. He has noted a 10 lb weight gain and abdominal swelling over past several weeks. His wife noted mild confusion and dark black stools with some redish tinge today and brought him to [facility] ED. Hb decreased to 9.1 from 13.5 in 09/2015. INR increased from 1.9 to 2.47. Ammonia=80. Denies ASA/NSAID usage.   Abd MRI 05/2015  1. Diffuse micro nodularity of the liver, with corresponding micronodular signal dropout on out of phase images along with some focal and geographic areas of signal dropout, favoring an unusual distribution of steatosis potentially in the setting of micronodular cirrhosis. The caudate lobe and lateral segment left hepatic lobe are borderline prominent which further raises suspicion for possible cirrhosis. Biopsy may be helpful in further workup. 2. Borderline splenomegaly. Splenorenal shunting noted, suggesting portal venous hypertension.   Past Medical History  Diagnosis Date  . Hypertension   . Hyperlipidemia   . Alcohol abuse   . Elevated LFTs   . Hepatic steatosis   . Portal hypertension ([place])   . Alcoholic cirrhosis ([place])   . Coagulopathy ([place])   . Thrombocytopenia ([place])   . Diverticulosis   . Esophageal varices ([employer])   . Tubular adenoma of colon   . GERD (gastroesophageal reflux disease)   . Gastropathy     Past Surgical History  Procedure Laterality Date  . Testicle prosthesis       Prior to Admission medications   Medication Sig Start Date End Date Taking? Authorizing Provider  bismuth subsalicylate (PEPTO BISMOL) 262 MG/15ML suspension Take 30 mLs by mouth every 6 (six) hours as needed for indigestion.   Yes Historical Provider, MD  calcium carbonate (TUMS EX) 750 MG chewable tablet Chew 1 tablet by mouth daily.   Yes Historical Provider, MD  furosemide (LASIX) 40 MG tablet Take 1 tablet (40 mg total) by mouth daily. 09/15/15  Yes [name], MD  Melatonin-Pyridoxine (MELATIN PO) Take 5 mg by mouth daily.   Yes Historical Provider, MD  nadolol (CORGARD) 40 MG tablet Take 1 tablet (40 mg total) by mouth daily. 10/07/15  Yes [name], MD  pantoprazole (PROTONIX) 40 MG tablet Take 1 tablet (40 mg total) by mouth 2 (two) times daily. 11/08/15  Yes [name], MD  lisinopril (PRINIVIL,ZESTRIL) 20 MG tablet Take 1 tablet (20 mg total) by mouth daily. Patient not taking: Reported on 03/05/2016 09/15/15   [name], MD  ondansetron (ZOFRAN) 4 MG tablet Take 1 tablet (4 mg total) by mouth every 6 (six) hours as needed for nausea or vomiting. Patient not taking: Reported on 10/07/2015 09/16/15   [name], MD  spironolactone (ALDACTONE) 100 MG tablet Take 1 tablet (100 mg total) by mouth daily. Patient not taking: Reported on 03/05/2016 09/15/15   [name], MD  sucralfate (CARAFATE) 1 GM/10ML suspension Take 10 mLs (1 g total) by mouth 3 (three) times daily before meals. X 2 weeks Patient  not taking: Reported on 03/05/2016 11/08/15   [name], MD    Current Facility-Administered Medications  Medication Dose Route Frequency Provider Last Rate Last Dose  . octreotide (SANDOSTATIN) 500 mcg in sodium chloride 0.9 % 250 mL (2 mcg/mL) infusion  50 mcg/hr Intravenous Continuous Leo Grosser, MD 25 mL/hr at 03/05/16 1449 50 mcg/hr at 03/05/16 1449  . ondansetron (ZOFRAN) injection 4 mg  4 mg Intravenous Q1H PRN Leo Grosser, MD   4 mg at 03/05/16 1506   Current  Outpatient Prescriptions  Medication Sig Dispense Refill  . bismuth subsalicylate (PEPTO BISMOL) 262 MG/15ML suspension Take 30 mLs by mouth every 6 (six) hours as needed for indigestion.    . calcium carbonate (TUMS EX) 750 MG chewable tablet Chew 1 tablet by mouth daily.    . furosemide (LASIX) 40 MG tablet Take 1 tablet (40 mg total) by mouth daily. 30 tablet 2  . Melatonin-Pyridoxine (MELATIN PO) Take 5 mg by mouth daily.    . nadolol (CORGARD) 40 MG tablet Take 1 tablet (40 mg total) by mouth daily. 30 tablet 5  . pantoprazole (PROTONIX) 40 MG tablet Take 1 tablet (40 mg total) by mouth 2 (two) times daily. 60 tablet 3  . lisinopril (PRINIVIL,ZESTRIL) 20 MG tablet Take 1 tablet (20 mg total) by mouth daily. (Patient not taking: Reported on 03/05/2016) 30 tablet 2  . ondansetron (ZOFRAN) 4 MG tablet Take 1 tablet (4 mg total) by mouth every 6 (six) hours as needed for nausea or vomiting. (Patient not taking: Reported on 10/07/2015) 30 tablet 1  . spironolactone (ALDACTONE) 100 MG tablet Take 1 tablet (100 mg total) by mouth daily. (Patient not taking: Reported on 03/05/2016) 30 tablet 2  . sucralfate (CARAFATE) 1 GM/10ML suspension Take 10 mLs (1 g total) by mouth 3 (three) times daily before meals. X 2 weeks (Patient not taking: Reported on 03/05/2016) 500 mL 0    Allergies as of 03/05/2016  . (No Known Allergies)    Family History  Problem Relation Age of Onset  . Diabetes Father   . Diabetes Paternal Aunt   . Parkinson's disease Father   . Glaucoma Mother     Social History   Social History  . Marital Status: Married    Spouse Name: Lelan Pons  . Number of Children: 3  . Years of Education: N/A   Occupational History  . Sales    Social History Main Topics  . Smoking status: Never Smoker   . Smokeless tobacco: Never Used  . Alcohol Use: No     Comment: Quit several months ago  . Drug Use: No  . Sexual Activity: Not on file   Other Topics Concern  . Not on file   Social  History Narrative   Lives with wife.  Normally able to ambulate without assistance.    Review of Systems: Gen: Denies any fever, chills, sweats, anorexia, fatigue, weakness, malaise, weight loss, and sleep disorder CV: Denies chest pain, angina, palpitations, syncope, orthopnea, PND, and claudication. Resp: Denies dyspnea at rest, dyspnea with exercise, cough, sputum, wheezing, coughing up blood, and pleurisy. GI: Denies vomiting blood, jaundice, and fecal incontinence.   Denies dysphagia or odynophagia. GU : Denies urinary burning, blood in urine, urinary frequency, urinary hesitancy, nocturnal urination, and urinary incontinence. MS: Denies joint pain, limitation of movement, stiffness, low back pain, extremity pain. Denies muscle weakness, cramps, atrophy.  Derm: Denies rash, itching, dry skin, hives, moles, warts, or unhealing ulcers.  Psych: Denies depression,  anxiety, suicidal ideation, hallucinations, paranoia. Heme: Denies bruising, bleeding, and enlarged lymph nodes. Neuro:  Denies any headaches, dizziness, paresthesias. Endo:  Denies any problems with DM, thyroid, adrenal function.  Physical Exam: Vital signs in last 24 hours: Temp:  [97.6 F (36.4 C)-98.6 F (37 C)] 98.6 F (37 C) (03/26 1302) Pulse Rate:  [72-76] 72 (03/26 1448) Resp:  [18-27] 27 (03/26 1448) BP: (140-158)/(72-81) 148/72 mmHg (03/26 1448) SpO2:  [94 %-98 %] 98 % (03/26 1448) Weight:  [180 lb (81.647 kg)] 180 lb (81.647 kg) (03/26 1025)    General:  Alert, well-developed, well-nourished, in NAD Head:  Normocephalic and atraumatic. Eyes:  Sclera clear, bilateral sclera icterus. Conjunctiva pink. Ears:  Normal auditory acuity. Nose:  No deformity, discharge, or lesions. Mouth:  No deformity or lesions. Oropharynx pink & moist. Neck:  Supple; no masses or thyromegaly. Chest:  Clear throughout to auscultation. No wheezes, crackles, or rhonchi. No acute distress. Heart:  Regular rate and rhythm; no  murmurs, clicks, rubs, or gallops. Abdomen:  Soft, nontender and mildly distended. Possible fluid wave. No masses, hepatosplenomegaly or hernias noted. Normal bowel sounds, without guarding, and without rebound.   Rectal:  Melena per EDP.   Msk:  Symmetrical without gross deformities. Normal posture. Pulses:  Normal pulses noted. Extremities:  Without clubbing. 2+ ankle and pretibial edema Neurologic:  Alert and  oriented x4;  grossly normal neurologically. Slow mentation.No asterixis.  Skin:  Intact without significant lesions or rashes. Cervical Nodes:  No significant cervical adenopathy. Psych:  Alert and cooperative. Normal mood and affect.   Intake/Output from previous day:   Intake/Output this shift:    Lab Results:  Recent Labs  03/05/16 1123  WBC 9.8  HGB 9.1*  HCT 26.7*  PLT 107*   BMET  Recent Labs  03/05/16 1124  NA 142  K 4.0  CL 98*  CO2 34*  GLUCOSE 109*  BUN 31*  CREATININE 1.53*  CALCIUM 8.2*   LFT  Recent Labs  03/05/16 1124  PROT 6.7  ALBUMIN 2.3*  AST 92*  ALT 25  ALKPHOS 152*  BILITOT 4.3*   PT/INR  Recent Labs  03/05/16 1124  LABPROT 25.6*  INR 2.47*    Previous Endoscopies: See HPI  Impression/ Recommendations:  1. Suspected UGI bleed with melena with increased BUN. History of portal hypertensive gastropathy and esophageal varices-both are likely etiologies of acute bleed. R/O ulcer, etc. Octreotide IV infusion, PPI IV infusion, trend CBC. EGD today. The risks (including bleeding, perforation, infection, missed lesions, medication reactions and possible hospitalization or surgery if complications occur), benefits, and alternatives to endoscopy with possible biopsy and possible dilation were discussed with the patient and they consent to proceed. Rocephin IV daily.    2. ABL anemia on chronic anemia. Secondary to 1 and 3. Transfuse to keep Hb > 8.   3. Decompensated etoh cirrhosis clinically has new ascites and MELD is 26  today. Vit K for coagulopathy although increased INR likely due to underlying liver disease. Schedule Abd [place]. Resume Nadolol when pt has stablized stable to reduce portal pressures.   4. Mild HE. Begin lactulose and trend ammonia.     LOS: 0 days   Malcolm T. Fuller Plan MD 03/05/2016, 3:22 PM [id number] Mon-Fri 8a-5p  [id number] after 5p, weekends, holidays

## 2016-03-05 NOTE — ED Notes (Signed)
Nausea which started this am. Pt wife states he was disoriented when he got up to use the bathroom. She reports dark red stool.

## 2016-03-06 ENCOUNTER — Inpatient Hospital Stay (HOSPITAL_COMMUNITY)
Admit: 2016-03-06 | Discharge: 2016-03-06 | Disposition: A | Discharge status: Home / Self Care | Payer: BLUE CROSS/BLUE SHIELD | Attending: Physician Assistant | Admitting: Physician Assistant

## 2016-03-06 ENCOUNTER — Encounter (HOSPITAL_COMMUNITY): Payer: Self-pay | Admitting: Physician Assistant

## 2016-03-06 DIAGNOSIS — R600 Localized edema: Secondary | ICD-10-CM

## 2016-03-06 DIAGNOSIS — K7290 Hepatic failure, unspecified without coma: Secondary | ICD-10-CM

## 2016-03-06 DIAGNOSIS — D539 Nutritional anemia, unspecified: Secondary | ICD-10-CM

## 2016-03-06 DIAGNOSIS — N179 Acute kidney failure, unspecified: Secondary | ICD-10-CM

## 2016-03-06 DIAGNOSIS — I8501 Esophageal varices with bleeding: Secondary | ICD-10-CM

## 2016-03-06 DIAGNOSIS — K7210 Chronic hepatic failure without coma: Secondary | ICD-10-CM | POA: Insufficient documentation

## 2016-03-06 DIAGNOSIS — D62 Acute posthemorrhagic anemia: Secondary | ICD-10-CM

## 2016-03-06 LAB — PROTEIN, BODY FLUID

## 2016-03-06 LAB — BODY FLUID CELL COUNT WITH DIFFERENTIAL
Eos, Fluid: 0 %
Lymphs, Fluid: 13 %
Monocyte-Macrophage-Serous Fluid: 76 % (ref 50–90)
Neutrophil Count, Fluid: 11 % (ref 0–25)
Total Nucleated Cell Count, Fluid: 245 cu mm (ref 0–1000)

## 2016-03-06 LAB — GRAM STAIN

## 2016-03-06 LAB — ALBUMIN, FLUID (OTHER)

## 2016-03-06 LAB — HEMATOCRIT
HCT: 23.6 % — ABNORMAL LOW (ref 39.0–52.0)
HCT: 23.5 % — ABNORMAL LOW (ref 39.0–52.0)
HEMATOCRIT: 25.1 % — AB (ref 39.0–52.0)

## 2016-03-06 LAB — HEMOGLOBIN
HEMOGLOBIN: 8 g/dL — AB (ref 13.0–17.0)
HEMOGLOBIN: 8.7 g/dL — AB (ref 13.0–17.0)
Hemoglobin: 8 g/dL — ABNORMAL LOW (ref 13.0–17.0)

## 2016-03-06 MED ORDER — RIFAXIMIN 550 MG PO TABS
550.0000 mg | ORAL_TABLET | Freq: Two times a day (BID) | ORAL | Status: DC
  Administered 2016-03-06 – 2016-03-07 (×3): 550 mg via ORAL
  Filled 2016-03-06 (×5): qty 1

## 2016-03-06 MED ORDER — SPIRONOLACTONE 100 MG PO TABS
100.0000 mg | ORAL_TABLET | Freq: Every day | ORAL | Status: DC
  Administered 2016-03-06 – 2016-03-07 (×2): 100 mg via ORAL
  Filled 2016-03-06 (×2): qty 1

## 2016-03-06 MED ORDER — VITAMIN K1 10 MG/ML IJ SOLN
10.0000 mg | Freq: Every day | INTRAMUSCULAR | Status: DC
  Administered 2016-03-06 – 2016-03-07 (×2): 10 mg via SUBCUTANEOUS
  Filled 2016-03-06 (×2): qty 1

## 2016-03-06 MED ORDER — FUROSEMIDE 40 MG PO TABS
40.0000 mg | ORAL_TABLET | Freq: Every day | ORAL | Status: DC
  Administered 2016-03-06 – 2016-03-07 (×2): 40 mg via ORAL
  Filled 2016-03-06 (×2): qty 1

## 2016-03-06 NOTE — Progress Notes (Signed)
Initial Nutrition Assessment  INTERVENTION:   Provide Carnation Instant Breakfast BID, each provides 220 kcal and 13g of protein RD to follow-up with Protein supplement list and Protein food list per pt and pt's wife request  NUTRITION DIAGNOSIS:   Inadequate oral intake related to poor appetite as evidenced by per patient/family report.  GOAL:   Patient will meet greater than or equal to 90% of their needs  MONITOR:   PO intake, Supplement acceptance, Labs, Weight trends, I & O's  REASON FOR ASSESSMENT:   Malnutrition Screening Tool    ASSESSMENT:   55 y.o. male with a PMH of alcoholic cirrhosis, esophageal varices, and portal hypertensive gastropathy who presents today at his wife's insistence when she noted that he was mildly confused and slow to respond this morning. She also reports that he had nausea but no frank vomiting. Over the past 2 days, his appetite has been diminished and she describes him as being tired and sleeping a lot. Over the past 24 hours, she has noticed his legs and ankle seem more swollen than usual. This morning, she reports he had a black/bloody stool.   Patient in room with wife at bedside. Pt currently NPO for ultrasound and possible paracentesis. Pt otherwise on full liquid diet until after midnight when he will be NPO again. Pt states that the last time he ate solid food was Saturday, 3/25, he had a cheeseburger and cinnamon roll which he did not finish. Pt states his appetite has fluctuated recently. States his appetite decreases with fluid accumulation and more recently he had a stomach bug. Denies any swallowing or chewing issues. Pt states normally he wont eat consistently, may have a poptart for breakfast and won't have anything until dinner. Pt's wife works away from home Mon-Fri so she is not there to make sure he eats. Pt has swelling in his legs and feels very hungry and thirsty. He does not like ensure/boost supplements. He is willing to try  [employer] drinks. Discussed other protein options and supplements. Pt's wife requests a list to refer to when they get home. RD to provide at follow-up.   Per weight history, pt has stable weight. Pt reports UBW of 170-175 lb but states that he had his weight taken recently and it was in the 170 range. Current weight 180 lb.  Pt with no signs of muscle or fat depletion.  Labs reviewed. Medications: Lasix tablet daily, Vitamin K injection daily  Diet Order:  Diet full liquid Room service appropriate?: Yes; Fluid consistency:: Thin Diet NPO time specified  Skin:  Reviewed, no issues  Last BM:  3/26  Height:   Ht Readings from Last 1 Encounters:  03/05/16 5\' 6"  (1.676 m)    Weight:   Wt Readings from Last 1 Encounters:  03/05/16 180 lb (81.647 kg)    Ideal Body Weight:  65.5 kg  BMI:  Body mass index is 29.07 kg/(m^2).  Estimated Nutritional Needs:   Kcal:  [id number]  Protein:  110-120g  Fluid:  1.8L/day  EDUCATION NEEDS:   Education needs addressed (Discussed protein needs and protein supplements)  Thomas Bibles, MS, RD, LDN Pager: [phone] After Hours Pager: [phone]

## 2016-03-06 NOTE — Progress Notes (Addendum)
NPO order noted Wife,heather RN and Dr Silvio Pate post procedure note state CL diet- Unable to reach answering service of Dr Fuller Plan GI Velora Heckler- see Nira Conn RN previous note. Pt tol CL well.LBM was 3/26 hypo bowell sounds no flatus,no c/o any pain at this time Thomas [name]

## 2016-03-06 NOTE — Progress Notes (Signed)
Patient ID: Thomas [name], male   DOB: 1961/06/07, 55 y.o.   MRN: [id number]    Progress Note   Subjective   Feels ok. No Bm's since yesterday, tolerating clears. Wife says mentally  Back to baseline HGB 8.0  Pt says no ETOH x few months   Objective   Vital signs in last 24 hours: Temp:  [97.6 F (36.4 C)-99.3 F (37.4 C)] 99.3 F (37.4 C) (03/27 0444) Pulse Rate:  [71-77] 74 (03/27 0444) Resp:  [18-27] 20 (03/27 0444) BP: (94-158)/(46-81) 135/74 mmHg (03/27 0444) SpO2:  [92 %-100 %] 95 % (03/27 0444) Weight:  [180 lb (81.647 kg)] 180 lb (81.647 kg) (03/26 1025) Last BM Date: 03/05/16 General:    white male in NAD Heart:  Regular rate and rhythm; no murmurs Lungs: Respirations even and unlabored, lungs CTA bilaterally Abdomen:  Soft, nontender and nondistended. Normal bowel sounds. Extremities:  Without edema. Neurologic:  Alert and oriented,  grossly normal neurologically sl asterixis Psych:  Cooperative. Normal mood and affect.  Intake/Output from previous day: 03/26 0701 - 03/27 0700 In: 1457.5 [P.O.:480; I.V.:277.5; IV Piggyback:700] Out: 300 [Urine:300] Intake/Output this shift:    Lab Results:  Recent Labs  03/05/16 1123 03/05/16 1939 03/06/16 0256  WBC 9.8  --   --   HGB 9.1* 9.1* 8.0*  HCT 26.7* 27.1* 23.6*  PLT 107*  --   --    BMET  Recent Labs  03/05/16 1124  NA 142  K 4.0  CL 98*  CO2 34*  GLUCOSE 109*  BUN 31*  CREATININE 1.53*  CALCIUM 8.2*   LFT  Recent Labs  03/05/16 1124  PROT 6.7  ALBUMIN 2.3*  AST 92*  ALT 25  ALKPHOS 152*  BILITOT 4.3*   PT/INR  Recent Labs  03/05/16 1124  LABPROT 25.6*  INR 2.47*    Studies/Results: No results found.     Assessment / Plan:    #1 55 yo male with decompensated cirrhosis secondary to ETOH with acute GI bleed from portal gastropathy  MELD 26  Hgb has drifted but no active bleeding On Octreotide and IV PPI  #2 anemia- acute on chronic #3 early encephalopathy- chronulac  started  #4 marked coagulopathy  Plan; Advance to full liquids  serial hgb and transfuse for hgb less than 7 Continue Octreotide and Protonix IV  Today Add Xifaxan 550 mg po BID for hepatic encephalopathy  Continue Vit K  Dr Silverio Decamp suggests diagnostic paracentesis to r/o SBP- will order  Principal Problem:   Upper GI bleed Active Problems:   Coagulopathy (HCC)   Alcoholic cirrhosis (HCC)   Acute kidney injury ([place])   Hepatic encephalopathy (HCC)   Macrocytic anemia   Thrombocytopenia (HCC)   Esophageal varices (HCC)   Acute blood loss anemia   Idiopathic esophageal varices without bleeding (HCC)   Portal hypertensive gastropathy     LOS: 1 day   Amy Esterwood  03/06/2016, 9:04 AM   Attending physician's note   I have taken an interval history, reviewed the chart and examined the patient. I agree with the Advanced Practitioner's note, impression and recommendations. Patient presented with decompensation of cirrhosis, worsening ascites, hepatic encepahlopathy and melena. Bili elevated at 4.2.  Abdominal ultrasound with doppler and diagnostic paracentesis to r/o SBP.  Hepatic encephalopathy: improved, no asterixis. Continue Lactulose and Rifaximin No varices on EGD, ok to stop Octreotide.  Increased edema and ascites, Spiranolactone 100mg  daily and lasix 40mg  daily. No ETOH for past 2 months, will follow  up with Dr Orvis Brill after discharge, will need to continue to abstain from ETOH to consider liver transplant.    Damaris Hippo, MD [phone] Mon-Fri 8a-5p [phone] after 5p, weekends, holidays

## 2016-03-06 NOTE — Procedures (Signed)
Ultrasound-guided diagnostic paracentesis performed yielding 710 cc of yellow fluid. No immediate complications. The fluid was sent to the lab for preordered studies. Only a small amount of ascites was noted on today's limited US abd.

## 2016-03-06 NOTE — Progress Notes (Addendum)
[facility]                                                                              Patient Demographics  Thomas [name], is a 55 y.o. male, DOB - 1961-01-20, [id number]  Admit date - 03/05/2016   Admitting Physician Venetia Maxon Rama, MD  Outpatient Primary MD for the patient is [name], MD  LOS - 1   Chief Complaint  Patient presents with  . Nausea      HPI on 03/05/2016 by Dr. Margreta Journey Rama Thomas [name] is an 55 y.o. male with a PMH of alcoholic cirrhosis, esophageal varices, and portal hypertensive gastropathy who presents today at his wife's insistence when she noted that he was mildly confused and slow to respond this morning. She also reports that he had nausea but no frank vomiting. Over the past 2 days, his appetite has been diminished and she describes him as being tired and sleeping a lot. Over the past 24 hours, she has noticed his legs and ankle seem more swollen than usual. This morning, she reports he had a black/bloody stool. Upon initial evaluation in the ED, patient was noted to have a drop in his hemoglobin from his usual baseline of 13 to 9.1. His INR was 2.47, his BUN was 31 and his lactate was 2.1. Patient is otherwise alert and is able to provide some of the history, though most of the history is given by his wife as his ability to remember the exact details are limited.  Assessment & Plan   Upper GI bleed/ Esophageal Varices/Acute Blood loss anemia -Gastroenterology consulted and appreciated -[id number] grade 1 esophageal varices, moderate portal hypertensive gastropathy -Hemoglobin currently 8, continue monitor CBC -Continue PPI, octreotide, nadolol -Xifaxin added   Coagulopathy ([place]) -Likely secondary to advanced liver disease.  -Continue vitamin K  Alcoholic cirrhosis (HCC)/ Ascites -No alcohol intake in several months per patient and his wife. -Continue supportive care. -Continue Lasix, spironolactone -Xifaxan  started -Gastroenterology ordered US paracentesis  Thrombocytopenia -Continue to monitor CBC  Acute kidney injury ([place]) -Likely prerenal in etiology. Baseline creatinine 0.7-0.8. -Creatinine upon admission 1.53, continue to monitor BMP  Hepatic encephalopathy (HCC) -Resolved -Continue lactulose -Monitor ammonia level   Macrocytic anemia/acute blood loss anemia -Baseline hemoglobin appears to be around 13. -Hemoglobin currently 8, continue monitor CBC -Transfuse if hemoglobin drops below 7  Code Status: Full  Family Communication: Family at bedside  Disposition Plan: Admitted.   Time Spent in minutes   30 minutes  Procedures  EGD  Consults   Gastroenterology   DVT Prophylaxis  SCDs  Lab Results  Component Value Date   PLT 107* 03/05/2016    Medications  Scheduled Meds: . calcium carbonate  1 tablet Oral Daily  . furosemide  40 mg Oral Daily  . lactulose  30 g Oral BID  . nadolol  40 mg Oral Daily  . pantoprazole (PROTONIX) IV  40 mg Intravenous Q12H  . phytonadione  10 mg Subcutaneous Daily  . rifaximin  550 mg Oral BID  . spironolactone  100 mg Oral Daily   Continuous Infusions:  PRN Meds:.acetaminophen **OR** acetaminophen, ondansetron **OR** ondansetron (ZOFRAN) IV  Antibiotics  Anti-infectives    Start     Dose/Rate Route Frequency Ordered Stop   03/06/16 1000  rifaximin (XIFAXAN) tablet 550 mg     550 mg Oral 2 times daily 03/06/16 0923     03/05/16 1800  [MAR Hold]  cefTRIAXone (ROCEPHIN) 1 g in dextrose 5 % 50 mL IVPB  Status:  Discontinued     (MAR Hold since 03/05/16 1649)   1 g 100 mL/hr over 30 Minutes Intravenous  Once 03/05/16 1627 03/05/16 1723      Subjective:   Thomas [name] seen and examined today.  Patient states he is feeling better this morning.  Denies chest pain, shortness of breath, abdominal pain, N/V/D/C, new weakness, numbess, tingling.  Feels his abdomen is tense and has gas.   Objective:   Filed Vitals:   03/05/16  1740 03/05/16 2132 03/06/16 0444 03/06/16 0830  BP: 117/65 128/67 135/74 142/69  Pulse: 73 76 74 70  Temp:  98.6 F (37 C) 99.3 F (37.4 C) 98.6 F (37 C)  TempSrc:  Oral Oral Oral  Resp: 22 20 20 20   Height:      Weight:      SpO2: 95% 92% 95% 98%    Wt Readings from Last 3 Encounters:  03/05/16 81.647 kg (180 lb)  10/07/15 83.008 kg (183 lb)  09/15/15 83.065 kg (183 lb 2 oz)     Intake/Output Summary (Last 24 hours) at 03/06/16 1515 Last data filed at 03/06/16 1045  Gross per 24 hour  Intake 1937.5 ml  Output    500 ml  Net 1437.5 ml    Exam  General: Well developed, well nourished, NAD, appears stated age  HEENT: NCAT, mucous membranes moist.   Cardiovascular: S1 S2 auscultated, no rubs, murmurs or gallops. Regular rate and rhythm.  Respiratory: Clear to auscultation bilaterally with equal chest rise  Abdomen: Soft, nontender, nondistended, + bowel sounds  Extremities: warm dry without cyanosis clubbing. LE edema B/L   Neuro: AAOx3, nonfocal  Skin: Without rashes exudates or nodules, mildly jaundice   Psych: Normal affect and demeanor  Data Review   Micro Results No results found for this or any previous visit (from the past 240 hour(s)).  Radiology Reports No results found.  CBC  Recent Labs Lab 03/05/16 1123 03/05/16 1939 03/06/16 0256 03/06/16 1143  WBC 9.8  --   --   --   HGB 9.1* 9.1* 8.0* 8.0*  HCT 26.7* 27.1* 23.6* 23.5*  PLT 107*  --   --   --   MCV 103.1*  --   --   --   MCH 35.1*  --   --   --   MCHC 34.1  --   --   --   RDW 17.2*  --   --   --   LYMPHSABS 1.8  --   --   --   MONOABS 1.9*  --   --   --   EOSABS 0.2  --   --   --   BASOSABS 0.0  --   --   --     Chemistries   Recent Labs Lab 03/05/16 1124  NA 142  K 4.0  CL 98*  CO2 34*  GLUCOSE 109*  BUN 31*  CREATININE 1.53*  CALCIUM 8.2*  AST 92*  ALT 25  ALKPHOS 152*  BILITOT 4.3*    ------------------------------------------------------------------------------------------------------------------ estimated creatinine clearance is 56 mL/min (by C-G formula based on Cr of 1.53). ------------------------------------------------------------------------------------------------------------------ No results for  input(s): HGBA1C in the last 72 hours. ------------------------------------------------------------------------------------------------------------------ No results for input(s): CHOL, HDL, LDLCALC, TRIG, CHOLHDL, LDLDIRECT in the last 72 hours. ------------------------------------------------------------------------------------------------------------------ No results for input(s): TSH, T4TOTAL, T3FREE, THYROIDAB in the last 72 hours.  Invalid input(s): FREET3 ------------------------------------------------------------------------------------------------------------------ No results for input(s): VITAMINB12, FOLATE, FERRITIN, TIBC, IRON, RETICCTPCT in the last 72 hours.  Coagulation profile  Recent Labs Lab 03/05/16 1124  INR 2.47*    No results for input(s): DDIMER in the last 72 hours.  Cardiac Enzymes No results for input(s): CKMB, TROPONINI, MYOGLOBIN in the last 168 hours.  Invalid input(s): CK ------------------------------------------------------------------------------------------------------------------ Invalid input(s): POCBNP    Kenneth Lax D.O. on 03/06/2016 at 3:15 PM  Between 7am to 7pm - Pager - [phone]  After 7pm go to [email or web address] - password [email or web address]  And look for the night coverage person covering for me after hours  [facility] Office  [phone]

## 2016-03-07 ENCOUNTER — Inpatient Hospital Stay (HOSPITAL_COMMUNITY): Payer: BLUE CROSS/BLUE SHIELD

## 2016-03-07 ENCOUNTER — Other Ambulatory Visit: Payer: Self-pay | Admitting: *Deleted

## 2016-03-07 ENCOUNTER — Encounter (HOSPITAL_COMMUNITY): Payer: Self-pay | Admitting: Gastroenterology

## 2016-03-07 DIAGNOSIS — K7460 Unspecified cirrhosis of liver: Secondary | ICD-10-CM

## 2016-03-07 DIAGNOSIS — K7031 Alcoholic cirrhosis of liver with ascites: Secondary | ICD-10-CM | POA: Insufficient documentation

## 2016-03-07 DIAGNOSIS — D696 Thrombocytopenia, unspecified: Secondary | ICD-10-CM

## 2016-03-07 LAB — CBC
HEMATOCRIT: 23.1 % — AB (ref 39.0–52.0)
Hemoglobin: 8 g/dL — ABNORMAL LOW (ref 13.0–17.0)
MCH: 37 pg — ABNORMAL HIGH (ref 26.0–34.0)
MCHC: 34.6 g/dL (ref 30.0–36.0)
MCV: 106.9 fL — ABNORMAL HIGH (ref 78.0–100.0)
PLATELETS: 83 10*3/uL — AB (ref 150–400)
RBC: 2.16 MIL/uL — ABNORMAL LOW (ref 4.22–5.81)
RDW: 17.8 % — AB (ref 11.5–15.5)
WBC: 9.6 10*3/uL (ref 4.0–10.5)

## 2016-03-07 LAB — PROTIME-INR
INR: 2.49 — AB (ref 0.00–1.49)
Prothrombin Time: 25.8 seconds — ABNORMAL HIGH (ref 11.6–15.2)

## 2016-03-07 LAB — BASIC METABOLIC PANEL
ANION GAP: 7 (ref 5–15)
BUN: 17 mg/dL (ref 6–20)
CALCIUM: 8.2 mg/dL — AB (ref 8.9–10.3)
CO2: 32 mmol/L (ref 22–32)
CREATININE: 1.02 mg/dL (ref 0.61–1.24)
Chloride: 101 mmol/L (ref 101–111)
GLUCOSE: 118 mg/dL — AB (ref 65–99)
Potassium: 2.9 mmol/L — ABNORMAL LOW (ref 3.5–5.1)
Sodium: 140 mmol/L (ref 135–145)

## 2016-03-07 LAB — HEPATIC FUNCTION PANEL
ALBUMIN: 2 g/dL — AB (ref 3.5–5.0)
ALT: 19 U/L (ref 17–63)
AST: 66 U/L — AB (ref 15–41)
Alkaline Phosphatase: 120 U/L (ref 38–126)
Bilirubin, Direct: 1.9 mg/dL — ABNORMAL HIGH (ref 0.1–0.5)
Indirect Bilirubin: 5 mg/dL — ABNORMAL HIGH (ref 0.3–0.9)
Total Bilirubin: 6.9 mg/dL — ABNORMAL HIGH (ref 0.3–1.2)
Total Protein: 6 g/dL — ABNORMAL LOW (ref 6.5–8.1)

## 2016-03-07 LAB — AMMONIA: AMMONIA: 50 umol/L — AB (ref 9–35)

## 2016-03-07 MED ORDER — RIFAXIMIN 550 MG PO TABS: 550.0000 mg | ORAL_TABLET | Freq: Two times a day (BID) | ORAL | Status: DC

## 2016-03-07 MED ORDER — LACTULOSE 10 GM/15ML PO SOLN: 30.0000 g | Freq: Two times a day (BID) | ORAL | Status: DC

## 2016-03-07 MED ORDER — SPIRONOLACTONE 100 MG PO TABS: 100.0000 mg | ORAL_TABLET | Freq: Every day | ORAL | Status: DC

## 2016-03-07 MED ORDER — POTASSIUM CHLORIDE CRYS ER 20 MEQ PO TBCR
40.0000 meq | EXTENDED_RELEASE_TABLET | Freq: Two times a day (BID) | ORAL | Status: DC
  Administered 2016-03-07: 40 meq via ORAL
  Filled 2016-03-07: qty 2

## 2016-03-07 NOTE — Progress Notes (Signed)
Nurse reviewed discharge instructions with patient and wife. Patient and wife voice understanding and have no questions at this time.

## 2016-03-07 NOTE — Discharge Summary (Signed)
Physician Discharge Summary  Thomas [name] [id number] DOB: 1961-05-02 DOA: 03/05/2016  PCP: [name], MD  Admit date: 03/05/2016 Discharge date: 03/07/2016  Time spent: 45 minutes  Recommendations for Outpatient Follow-up:  Patient will be discharged to home.  Patient will need to follow up with primary care provider within one week of discharge, repeat CBC and CMP.  Follow up with gastroenterology at the specified time.  Patient should continue medications as prescribed.  Patient should follow a soft diet, avoid red meats.  Discharge Diagnoses:  Upper GI bleed/esophageal varices/acute blood loss anemia Coagulopathy Alcoholic cirrhosis with ascites Thrombus cytopenia Acute kidney injury Hepatic encephalopathy Macrocytic anemia  Discharge Condition: stable  Diet recommendation: Soft, advance as tolerated  Filed Weights   03/05/16 1025  Weight: 81.647 kg (180 lb)    History of present illness:  on 03/05/2016 by Dr. Margreta Journey Rama Pedro Hocker is an 55 y.o. male with a PMH of alcoholic cirrhosis, esophageal varices, and portal hypertensive gastropathy who presents today at his wife's insistence when she noted that he was mildly confused and slow to respond this morning. She also reports that he had nausea but no frank vomiting. Over the past 2 days, his appetite has been diminished and she describes him as being tired and sleeping a lot. Over the past 24 hours, she has noticed his legs and ankle seem more swollen than usual. This morning, she reports he had a black/bloody stool. Upon initial evaluation in the ED, patient was noted to have a drop in his hemoglobin from his usual baseline of 13 to 9.1. His INR was 2.47, his BUN was 31 and his lactate was 2.1. Patient is otherwise alert and is able to provide some of the history, though most of the history is given by his wife as his ability to remember the exact details are limited.  Hospital Course:  Upper GI bleed/ Esophageal  Varices/Acute Blood loss anemia -Gastroenterology consulted and appreciated -[id number] grade 1 esophageal varices, moderate portal hypertensive gastropathy -Hemoglobin currently 8 -Continue PPI, nadolol -Xifaxin added  -Octreotide discontinued by GI -Repeat CBC in one week  Coagulopathy ([place]) -Likely secondary to advanced liver disease.  -Was given vitamin K  Alcoholic cirrhosis (HCC)/ Ascites -No alcohol intake in several months per patient and his wife. -Continue supportive care. -Continue Lasix, spironolactone -Xifaxan started -Gastroenterology ordered US paracentesis- 710cc of yellow fluid removed -Fluid culture/gram stain: no organisms seen -Abdominal US: Normal directional flow, velocities and waveforms. Findings compatible with portal venous hypertension. Nodular heterogeneous appearing liver. Low lithiasis without evidence of cholecystitis.  Thrombocytopenia -Continue to monitor CBC  Acute kidney injury ([place]) -Likely prerenal in etiology. Baseline creatinine 0.7-0.8. -Creatinine upon admission 1.53 -Currently Cr 1.02 -Repeat BMP or CMP in one week  Hepatic encephalopathy (HCC) -Resolved -Continue lactulose -Monitor ammonia level- was 80 on admission, currently 50 -Repeat CMP in one week  Macrocytic anemia/acute blood loss anemia -Baseline hemoglobin appears to be around 13. -Hemoglobin currently 8  Procedures  EGD US guided paracentesis  Consults  Gastroenterology  Interventional radiology   Discharge Exam: Filed Vitals:   03/07/16 0430 03/07/16 0958  BP: 140/75 144/72  Pulse: 73 68  Temp: 99.2 F (37.3 C)   Resp: 18     Exam  General: Well developed, well nourished, NAD  HEENT: NCAT, mucous membranes moist.   Cardiovascular: S1 S2 auscultated, RRR  Respiratory: Clear to auscultation bilaterally  Abdomen: Soft, nontender, mildly distended, + bowel sounds  Extremities: warm dry without cyanosis clubbing. LE edema  B/L  Neuro:  AAOx3, nonfocal  Skin: Without rashes exudates or nodules, mildly jaundice  Psych: Normal affect and demeanor  Discharge Instructions      Discharge Instructions    Discharge instructions    Complete by:  As directed   Patient will be discharged to home.  Patient will need to follow up with primary care provider within one week of discharge, repeat CBC and CMP.  Follow up with gastroenterology at the specified time.  Patient should continue medications as prescribed.  Patient should follow a soft diet, avoid red meats.            Medication List    STOP taking these medications        lisinopril 20 MG tablet  Commonly known as:  PRINIVIL,ZESTRIL     sucralfate 1 GM/10ML suspension  Commonly known as:  CARAFATE      TAKE these medications        bismuth subsalicylate [id number] suspension  Commonly known as:  PEPTO BISMOL  Take 30 mLs by mouth every 6 (six) hours as needed for indigestion.     calcium carbonate 750 MG chewable tablet  Commonly known as:  TUMS EX  Chew 1 tablet by mouth daily.     furosemide 40 MG tablet  Commonly known as:  LASIX  Take 1 tablet (40 mg total) by mouth daily.     lactulose 10 GM/15ML solution  Commonly known as:  CHRONULAC  Take 45 mLs (30 g total) by mouth 2 (two) times daily.     MELATIN PO  Take 5 mg by mouth daily.     nadolol 40 MG tablet  Commonly known as:  CORGARD  Take 1 tablet (40 mg total) by mouth daily.     ondansetron 4 MG tablet  Commonly known as:  ZOFRAN  Take 1 tablet (4 mg total) by mouth every 6 (six) hours as needed for nausea or vomiting.     pantoprazole 40 MG tablet  Commonly known as:  PROTONIX  Take 1 tablet (40 mg total) by mouth 2 (two) times daily.     rifaximin 550 MG Tabs tablet  Commonly known as:  XIFAXAN  Take 1 tablet (550 mg total) by mouth 2 (two) times daily.     spironolactone 100 MG tablet  Commonly known as:  ALDACTONE  Take 1 tablet (100 mg total) by mouth daily.       No  Known Allergies Follow-up Information    Follow up with ZEHR, JESSICA D., PA-C On 03/15/2016.   Specialty:  Gastroenterology   Why:  1:30 pm   Contact information:   [place] [address] [phone]       Follow up with [name], MD. Schedule an appointment as soon as possible for a visit in 1 week.   Specialty:  Family Medicine   Why:  Hospital follow up   Contact information:   [address] [place] [address] [phone]        The results of significant diagnostics from this hospitalization (including imaging, microbiology, ancillary and laboratory) are listed below for reference.    Significant Diagnostic Studies: US Abdomen Complete  03/07/2016  CLINICAL DATA:  History of alcoholic cirrhosis, hypertension and GERD EXAM: 1. ABDOMINAL ULTRASOUND 2. DUPLEX ULTRASOUND OF LIVER TECHNIQUE: Standard protocol abdominal ultrasound was performed followed by a hepatic Doppler evaluation with color and duplex Doppler ultrasound was performed to evaluate the hepatic in-flow  and out-flow vessels. COMPARISON:  Ultrasound-guided paracentesis - 03/06/2016; abdominal MRI - 06/03/2015; abdominal CT - 04/09/2015 FINDINGS: Gallbladder: There is layering mixed echogenic sludge within otherwise normal-appearing gallbladder. No definitive gallbladder wall thickening given moderate volume intra-abdominal ascites. Negative sonographic Murphy's sign. Common bile duct: Diameter: Normal in size measuring 2.5 mm in diameter Liver: There is diffuse coarsened echogenicity of the hepatic parenchyma with nodularity of the hepatic contour (representative image 46), compatible with known cirrhosis. No discrete hepatic lesions. No intrahepatic biliary duct dilatation. Moderate volume intra-abdominal ascites. IVC: No abnormality visualized. Pancreas: Largely obscured secondary to bowel gas. Spleen: Enlarged measuring 12.9 x 14.3 x 5.0 cm (with calculated volume of 485 cm3). Right  Kidney: Normal cortical thickness, echogenicity and size, measuring 12.2 cm in length. No focal renal lesions. No echogenic renal stones. No urinary obstruction. Left Kidney: Normal cortical thickness, echogenicity and size, measuring 10.8 cm in length. No focal renal lesions. No echogenic renal stones. No urinary obstruction. Abdominal aorta: No aneurysm visualized. Other findings: None. _______________________________________________________________________ Portal Vein Velocities Main:  128.6 Cm/sec Right:  80.3 cm/sec Left:  55.9 cm/sec Hepatic Vein Velocities Right:  56.5 cm/sec Middle:  57.5 cm/sec Left:  26.4 cm/sec Hepatic Artery Velocity:  142.1 cm/sec Splenic Vein Velocity:  Not well visualized due to bowel gas Varices: None visualized Ascites: Moderate volume intra-abdominal ascites IMPRESSION: 1. Normal directional flow, velocities and waveforms demonstrated throughout the hepatic vascular system. 2. Findings compatible with portal venous hypertension including splenomegaly and moderate volume intra-abdominal ascites 3. Nodular heterogeneous appearing liver compatible with known history of cirrhosis. No discrete hepatic lesion though further evaluation could be performed with nonemergent abdominal MRI as clinically indicated. 4. Cholelithiasis without evidence of cholecystitis. Electronically Signed   By: Sandi Mariscal M.D.   On: 03/07/2016 09:56   [place] Art/ven Flow Abd Pelv Doppler  03/07/2016  CLINICAL DATA:  History of alcoholic cirrhosis, hypertension and GERD EXAM: 1. ABDOMINAL ULTRASOUND 2. DUPLEX ULTRASOUND OF LIVER TECHNIQUE: Standard protocol abdominal ultrasound was performed followed by a hepatic Doppler evaluation with color and duplex Doppler ultrasound was performed to evaluate the hepatic in-flow and out-flow vessels. COMPARISON:  Ultrasound-guided paracentesis - 03/06/2016; abdominal MRI - 06/03/2015; abdominal CT - 04/09/2015 FINDINGS: Gallbladder: There is layering mixed echogenic sludge  within otherwise normal-appearing gallbladder. No definitive gallbladder wall thickening given moderate volume intra-abdominal ascites. Negative sonographic Murphy's sign. Common bile duct: Diameter: Normal in size measuring 2.5 mm in diameter Liver: There is diffuse coarsened echogenicity of the hepatic parenchyma with nodularity of the hepatic contour (representative image 46), compatible with known cirrhosis. No discrete hepatic lesions. No intrahepatic biliary duct dilatation. Moderate volume intra-abdominal ascites. IVC: No abnormality visualized. Pancreas: Largely obscured secondary to bowel gas. Spleen: Enlarged measuring 12.9 x 14.3 x 5.0 cm (with calculated volume of 485 cm3). Right Kidney: Normal cortical thickness, echogenicity and size, measuring 12.2 cm in length. No focal renal lesions. No echogenic renal stones. No urinary obstruction. Left Kidney: Normal cortical thickness, echogenicity and size, measuring 10.8 cm in length. No focal renal lesions. No echogenic renal stones. No urinary obstruction. Abdominal aorta: No aneurysm visualized. Other findings: None. _______________________________________________________________________ Portal Vein Velocities Main:  128.6 Cm/sec Right:  80.3 cm/sec Left:  55.9 cm/sec Hepatic Vein Velocities Right:  56.5 cm/sec Middle:  57.5 cm/sec Left:  26.4 cm/sec Hepatic Artery Velocity:  142.1 cm/sec Splenic Vein Velocity:  Not well visualized due to bowel gas Varices: None visualized Ascites: Moderate volume intra-abdominal ascites IMPRESSION: 1. Normal directional flow, velocities and waveforms  demonstrated throughout the hepatic vascular system. 2. Findings compatible with portal venous hypertension including splenomegaly and moderate volume intra-abdominal ascites 3. Nodular heterogeneous appearing liver compatible with known history of cirrhosis. No discrete hepatic lesion though further evaluation could be performed with nonemergent abdominal MRI as clinically  indicated. 4. Cholelithiasis without evidence of cholecystitis. Electronically Signed   By: Sandi Mariscal M.D.   On: 03/07/2016 09:56   US Paracentesis  03/06/2016  INDICATION: Cirrhosis, ascites.  Request made for diagnostic paracentesis. EXAM: ULTRASOUND GUIDED DIAGNOSTIC PARACENTESIS MEDICATIONS: None. COMPLICATIONS: None immediate. PROCEDURE: Informed written consent was obtained from the patient after a discussion of the risks, benefits and alternatives to treatment. A timeout was performed prior to the initiation of the procedure. Initial ultrasound scanning demonstrates a small amount of ascites within the right upper to mid abdominal quadrant. The right upper to mid abdomen was prepped and draped in the usual sterile fashion. 1% lidocaine was used for local anesthesia. Following this, a Yueh catheter was introduced. An ultrasound image was saved for documentation purposes. The paracentesis was performed. The catheter was removed and a dressing was applied. The patient tolerated the procedure well without immediate post procedural complication. FINDINGS: A total of approximately 710 cc of yellow fluid was removed. Samples were sent to the laboratory as requested by the clinical team. IMPRESSION: Successful ultrasound-guided diagnostic paracentesis yielding 710 cc liters of peritoneal fluid. Read by: Rowe Robert, PA-C Electronically Signed   By: Marybelle Killings M.D.   On: 03/06/2016 15:40    Microbiology: Recent Results (from the past 240 hour(s))  Gram stain     Status: None   Collection Time: 03/06/16  2:58 PM  Result Value Ref Range Status   Specimen Description FLUID PERITONEAL  Final   Special Requests NONE  Final   Gram Stain   Final    FEW WBC PRESENT, PREDOMINANTLY MONONUCLEAR NO ORGANISMS SEEN Performed at [facility]    Report Status 03/06/2016 FINAL  Final     Labs: Basic Metabolic Panel:  Recent Labs Lab 03/05/16 1124 03/07/16 0358  NA 142 140  K 4.0 2.9*  CL 98*  101  CO2 34* 32  GLUCOSE 109* 118*  BUN 31* 17  CREATININE 1.53* 1.02  CALCIUM 8.2* 8.2*   Liver Function Tests:  Recent Labs Lab 03/05/16 1124  AST 92*  ALT 25  ALKPHOS 152*  BILITOT 4.3*  PROT 6.7  ALBUMIN 2.3*   No results for input(s): LIPASE, AMYLASE in the last 168 hours.  Recent Labs Lab 03/05/16 1124 03/07/16 1016  AMMONIA 80* 50*   CBC:  Recent Labs Lab 03/05/16 1123 03/05/16 1939 03/06/16 0256 03/06/16 1143 03/06/16 1900 03/07/16 0358  WBC 9.8  --   --   --   --  9.6  NEUTROABS 5.9  --   --   --   --   --   HGB 9.1* 9.1* 8.0* 8.0* 8.7* 8.0*  HCT 26.7* 27.1* 23.6* 23.5* 25.1* 23.1*  MCV 103.1*  --   --   --   --  106.9*  PLT 107*  --   --   --   --  83*   Cardiac Enzymes: No results for input(s): CKTOTAL, CKMB, CKMBINDEX, TROPONINI in the last 168 hours. BNP: BNP (last 3 results) No results for input(s): BNP in the last 8760 hours.  ProBNP (last 3 results) No results for input(s): PROBNP in the last 8760 hours.  CBG: No results for input(s): GLUCAP in the  last 168 hours.     SignedCristal Ford  [facility] 03/07/2016, 11:16 AM

## 2016-03-07 NOTE — Progress Notes (Signed)
Nutrition Note  RD provided Protein supplement and high protein foods list per pt request. Pt liked [employer] Breakfast drink. Per RN note, pt did not consume this morning d/t lunch having been ordered. Diet has been advanced to soft diet. RD to monitor PO intakes.   If other nutrition issues arise, please consult RD.  [name], MS, RD, LDN Pager: [phone] After Hours Pager: [phone]

## 2016-03-07 NOTE — Discharge Instructions (Signed)
Alcoholic Liver Disease  The liver is an organ that converts food into energy, absorbs vitamins from food, removes toxins from the blood, and makes proteins. Alcoholic liver disease happens when the liver becomes damaged due to alcohol consumption and it stops working properly.  CAUSES  This condition is caused by drinking too much alcohol over a number of years.  RISK FACTORS  This condition is more likely to develop in:  · Women.  · People who have a family history of the disease.  · People who have poor nutrition.  · People who are obese.  SYMPTOMS  Early symptoms of this condition include:  · Fatigue.  · Weakness.  · Abdominal discomfort on the upper right side.  Symptoms of moderate disease include:  · Fever.  · Yellow, pale, or darkening skin.  · Abdominal pain.  · Nausea and vomiting.  · Weight loss.  · Loss of appetite.  Symptoms of advanced disease include:  · Abdominal swelling.  · Nosebleeds or bleeding gums.  · Itchy skin.  · Enlarged fingertips (clubbing).  · Light-colored stools that smell very bad (steatorrhea).  · Mood changes.  · Feeling agitated.  · Confusion.  · Trouble concentrating.  Some people do not have symptoms until the condition becomes severe. Symptoms are often worse after heavy drinking.  DIAGNOSIS  This condition is diagnosed with:  · A physical exam.  · Blood tests.  · Taking a sample of liver tissue to examine under a microscope (liver biopsy).  You may also have other tests, including:   · X-rays.  · Ultrasound.  TREATMENT  Treatment may include:  · Stopping alcohol use to allow the liver to heal.  · Joining a support group or meeting with a counselor.  · Medicines to reduce inflammation. These may be recommended if the disease is severe.  · A liver transplant. This is the only treatment if the disease is very severe.  · Nutritional therapy. This may involve:    Taking vitamins.    Eating foods that are high in thiamine, such as whole-wheat cereals, pork, and raw vegetables.    Eating foods that have a lot of folic acid, such as vegetables, fruits, meats, beans, nuts, and dairy foods.    Eating a diet that includes carbohydrate-rich foods, such as yogurt, beans, potatoes, and rice.  HOME CARE INSTRUCTIONS  · Do not drink alcohol.  · Take medicines only as directed by your health care provider.  · Take vitamins only as directed by your health care provider.  · Follow any diet instructions that are given to you by your health care provider.  SEEK MEDICAL CARE IF:  · You have a fever.  · You have shortness of breath or have difficulty breathing.  · You have bright red blood in your stool, or you have black, tarry stools.  · You are vomiting blood.  · Your skin color becomes more yellow, pale, or dark.  · You develop headaches.  · You have trouble thinking.  · You have problems balancing or walking.     This information is not intended to replace advice given to you by your health care provider. Make sure you discuss any questions you have with your health care provider.     Document Released: 12/18/2014 Document Reviewed: 12/18/2014  [employer]vier Interactive Patient Education ©2016 [employer]

## 2016-03-07 NOTE — Progress Notes (Signed)
[facility] Progress Note  Subjective:  Feels much better.  Gets full quickly when trying to eat.  Hgb stable around 8 grams.  Objective:  Vital signs in last 24 hours: Temp:  [98.8 F (37.1 C)-99.6 F (37.6 C)] 99.2 F (37.3 C) (03/28 0430) Pulse Rate:  [68-73] 68 (03/28 0958) Resp:  [18] 18 (03/28 0430) BP: (120-155)/(56-76) 144/72 mmHg (03/28 0958) SpO2:  [94 %-95 %] 94 % (03/28 0430) Last BM Date: 03/07/16 General:  Alert, Well developed, in NAD Heart:  Regular rate and rhythm; no murmurs Pulm:  CTAB.  No W/R/R. Abdomen:  Soft, slightly distended.  BS present.  Non-tender. Extremities:  Significant B/L LE edema noted. Neurologic:  Alert and oriented x 4;  grossly normal neurologically.  Conversing well.  Very mild asterixis. Psych:  Alert and cooperative.  Normal mood and affect.  Intake/Output from previous day: 03/27 0701 - 03/28 0700 In: 1626.7 [P.O.:1200; I.V.:426.7] Out: 200 [Urine:200]  Lab Results:  Recent Labs  03/05/16 1123  03/06/16 1143 03/06/16 1900 03/07/16 0358  WBC 9.8  --   --   --  9.6  HGB 9.1*  < > 8.0* 8.7* 8.0*  HCT 26.7*  < > 23.5* 25.1* 23.1*  PLT 107*  --   --   --  83*  < > = values in this interval not displayed. BMET  Recent Labs  03/05/16 1124 03/07/16 0358  NA 142 140  K 4.0 2.9*  CL 98* 101  CO2 34* 32  GLUCOSE 109* 118*  BUN 31* 17  CREATININE 1.53* 1.02  CALCIUM 8.2* 8.2*   LFT  Recent Labs  03/05/16 1124  PROT 6.7  ALBUMIN 2.3*  AST 92*  ALT 25  ALKPHOS 152*  BILITOT 4.3*   PT/INR  Recent Labs  03/05/16 1124 03/07/16 0823  LABPROT 25.6* 25.8*  INR 2.47* 2.49*   US Abdomen Complete  03/07/2016  CLINICAL DATA:  History of alcoholic cirrhosis, hypertension and GERD EXAM: 1. ABDOMINAL ULTRASOUND 2. DUPLEX ULTRASOUND OF LIVER TECHNIQUE: Standard protocol abdominal ultrasound was performed followed by a hepatic Doppler evaluation with color and duplex Doppler ultrasound was performed to  evaluate the hepatic in-flow and out-flow vessels. COMPARISON:  Ultrasound-guided paracentesis - 03/06/2016; abdominal MRI - 06/03/2015; abdominal CT - 04/09/2015 FINDINGS: Gallbladder: There is layering mixed echogenic sludge within otherwise normal-appearing gallbladder. No definitive gallbladder wall thickening given moderate volume intra-abdominal ascites. Negative sonographic Murphy's sign. Common bile duct: Diameter: Normal in size measuring 2.5 mm in diameter Liver: There is diffuse coarsened echogenicity of the hepatic parenchyma with nodularity of the hepatic contour (representative image 46), compatible with known cirrhosis. No discrete hepatic lesions. No intrahepatic biliary duct dilatation. Moderate volume intra-abdominal ascites. IVC: No abnormality visualized. Pancreas: Largely obscured secondary to bowel gas. Spleen: Enlarged measuring 12.9 x 14.3 x 5.0 cm (with calculated volume of 485 cm3). Right Kidney: Normal cortical thickness, echogenicity and size, measuring 12.2 cm in length. No focal renal lesions. No echogenic renal stones. No urinary obstruction. Left Kidney: Normal cortical thickness, echogenicity and size, measuring 10.8 cm in length. No focal renal lesions. No echogenic renal stones. No urinary obstruction. Abdominal aorta: No aneurysm visualized. Other findings: None. _______________________________________________________________________ Portal Vein Velocities Main:  128.6 Cm/sec Right:  80.3 cm/sec Left:  55.9 cm/sec Hepatic Vein Velocities Right:  56.5 cm/sec Middle:  57.5 cm/sec Left:  26.4 cm/sec Hepatic Artery Velocity:  142.1 cm/sec Splenic Vein Velocity:  Not well visualized due to bowel gas Varices: None  visualized Ascites: Moderate volume intra-abdominal ascites IMPRESSION: 1. Normal directional flow, velocities and waveforms demonstrated throughout the hepatic vascular system. 2. Findings compatible with portal venous hypertension including splenomegaly and moderate volume  intra-abdominal ascites 3. Nodular heterogeneous appearing liver compatible with known history of cirrhosis. No discrete hepatic lesion though further evaluation could be performed with nonemergent abdominal MRI as clinically indicated. 4. Cholelithiasis without evidence of cholecystitis. Electronically Signed   By: Sandi Mariscal M.D.   On: 03/07/2016 09:56   [place] Art/ven Flow Abd Pelv Doppler  03/07/2016  CLINICAL DATA:  History of alcoholic cirrhosis, hypertension and GERD EXAM: 1. ABDOMINAL ULTRASOUND 2. DUPLEX ULTRASOUND OF LIVER TECHNIQUE: Standard protocol abdominal ultrasound was performed followed by a hepatic Doppler evaluation with color and duplex Doppler ultrasound was performed to evaluate the hepatic in-flow and out-flow vessels. COMPARISON:  Ultrasound-guided paracentesis - 03/06/2016; abdominal MRI - 06/03/2015; abdominal CT - 04/09/2015 FINDINGS: Gallbladder: There is layering mixed echogenic sludge within otherwise normal-appearing gallbladder. No definitive gallbladder wall thickening given moderate volume intra-abdominal ascites. Negative sonographic Murphy's sign. Common bile duct: Diameter: Normal in size measuring 2.5 mm in diameter Liver: There is diffuse coarsened echogenicity of the hepatic parenchyma with nodularity of the hepatic contour (representative image 46), compatible with known cirrhosis. No discrete hepatic lesions. No intrahepatic biliary duct dilatation. Moderate volume intra-abdominal ascites. IVC: No abnormality visualized. Pancreas: Largely obscured secondary to bowel gas. Spleen: Enlarged measuring 12.9 x 14.3 x 5.0 cm (with calculated volume of 485 cm3). Right Kidney: Normal cortical thickness, echogenicity and size, measuring 12.2 cm in length. No focal renal lesions. No echogenic renal stones. No urinary obstruction. Left Kidney: Normal cortical thickness, echogenicity and size, measuring 10.8 cm in length. No focal renal lesions. No echogenic renal stones. No urinary  obstruction. Abdominal aorta: No aneurysm visualized. Other findings: None. _______________________________________________________________________ Portal Vein Velocities Main:  128.6 Cm/sec Right:  80.3 cm/sec Left:  55.9 cm/sec Hepatic Vein Velocities Right:  56.5 cm/sec Middle:  57.5 cm/sec Left:  26.4 cm/sec Hepatic Artery Velocity:  142.1 cm/sec Splenic Vein Velocity:  Not well visualized due to bowel gas Varices: None visualized Ascites: Moderate volume intra-abdominal ascites IMPRESSION: 1. Normal directional flow, velocities and waveforms demonstrated throughout the hepatic vascular system. 2. Findings compatible with portal venous hypertension including splenomegaly and moderate volume intra-abdominal ascites 3. Nodular heterogeneous appearing liver compatible with known history of cirrhosis. No discrete hepatic lesion though further evaluation could be performed with nonemergent abdominal MRI as clinically indicated. 4. Cholelithiasis without evidence of cholecystitis. Electronically Signed   By: Sandi Mariscal M.D.   On: 03/07/2016 09:56   US Paracentesis  03/06/2016  INDICATION: Cirrhosis, ascites.  Request made for diagnostic paracentesis. EXAM: ULTRASOUND GUIDED DIAGNOSTIC PARACENTESIS MEDICATIONS: None. COMPLICATIONS: None immediate. PROCEDURE: Informed written consent was obtained from the patient after a discussion of the risks, benefits and alternatives to treatment. A timeout was performed prior to the initiation of the procedure. Initial ultrasound scanning demonstrates a small amount of ascites within the right upper to mid abdominal quadrant. The right upper to mid abdomen was prepped and draped in the usual sterile fashion. 1% lidocaine was used for local anesthesia. Following this, a Yueh catheter was introduced. An ultrasound image was saved for documentation purposes. The paracentesis was performed. The catheter was removed and a dressing was applied. The patient tolerated the procedure  well without immediate post procedural complication. FINDINGS: A total of approximately 710 cc of yellow fluid was removed. Samples were sent to the laboratory as  requested by the clinical team. IMPRESSION: Successful ultrasound-guided diagnostic paracentesis yielding 710 cc liters of peritoneal fluid. Read by: Rowe Robert, PA-C Electronically Signed   By: Marybelle Killings M.D.   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  MELD 26.  Ultrasound confirms cirrhosis but no other issues.  No ETOH in about 2 months. #2 Anemia- acute on chronic.  Hgb hold stable around 8 grams. #3 Early encephalopathy- chronulac and xifaxan started.  Ammonia level improved as well as mental status. #4 Marked coagulopathy #5 Ascites/LE edema:  Paracentesis with only 710 mL's removed for diagnostic purposes only, negative for SBP.  -Continue Nadolol 40 mg daily and pantoprazole 40 mg BID. -Continue Xifaxan 550 mg BID and lactulose 30 grams BID. -Continue lasix 40 mg daily and spironolactone 100 mg daily. -Will have repeat labs in our office on Friday, 3/31. -They will contact his liver MD at [facility] to schedule an appt for follow-up with them and he has also been tentatively scheduled for OV at our office next week as well. -Continue to avoid ETOH.   LOS: 2 days   ZEHR, JESSICA D.  03/07/2016, 11:22 AM  Pager number [phone]    Attending physician's note   I have taken an interval history, reviewed the chart and examined the patient. I agree with the Advanced Practitioner's note, impression and recommendations. Abdominal ultrasound and doppler with no evidence of mass lesions or PVT. Ok to DC home with follow up.  Damaris Hippo, MD [phone] Mon-Fri 8a-5p [phone] after 5p, weekends, holidays

## 2016-03-07 NOTE — Progress Notes (Signed)
Patient refused [employer] because lunch is on the way. Patient is worried he will not be able to eat lunch if he drinks [employer].

## 2016-03-10 ENCOUNTER — Other Ambulatory Visit: Payer: Self-pay | Admitting: Internal Medicine

## 2016-03-10 ENCOUNTER — Other Ambulatory Visit (INDEPENDENT_AMBULATORY_CARE_PROVIDER_SITE_OTHER): Payer: BLUE CROSS/BLUE SHIELD

## 2016-03-10 DIAGNOSIS — K7460 Unspecified cirrhosis of liver: Secondary | ICD-10-CM | POA: Diagnosis not present

## 2016-03-10 LAB — HEPATIC FUNCTION PANEL
ALBUMIN: 2.1 g/dL — AB (ref 3.5–5.2)
ALK PHOS: 118 U/L — AB (ref 39–117)
ALT: 19 U/L (ref 0–53)
AST: 62 U/L — AB (ref 0–37)
BILIRUBIN DIRECT: 1.8 mg/dL — AB (ref 0.0–0.3)
BILIRUBIN TOTAL: 5.5 mg/dL — AB (ref 0.2–1.2)
Total Protein: 6.1 g/dL (ref 6.0–8.3)

## 2016-03-10 LAB — CBC WITH DIFFERENTIAL/PLATELET
BASOS PCT: 0 % (ref 0.0–3.0)
Basophils Absolute: 0 10*3/uL (ref 0.0–0.1)
EOS PCT: 1.8 % (ref 0.0–5.0)
Eosinophils Absolute: 0.2 10*3/uL (ref 0.0–0.7)
Hemoglobin: 8.1 g/dL — ABNORMAL LOW (ref 13.0–17.0)
LYMPHS ABS: 2.3 10*3/uL (ref 0.7–4.0)
Lymphocytes Relative: 18.4 % (ref 12.0–46.0)
MCHC: 33.7 g/dL (ref 30.0–36.0)
MCV: 110.9 fl — AB (ref 78.0–100.0)
MONO ABS: 2.2 10*3/uL — AB (ref 0.1–1.0)
MONOS PCT: 18 % — AB (ref 3.0–12.0)
NEUTROS ABS: 7.6 10*3/uL (ref 1.4–7.7)
NEUTROS PCT: 61.8 % (ref 43.0–77.0)
PLATELETS: 98 10*3/uL — AB (ref 150.0–400.0)
RBC: 2.16 Mil/uL — ABNORMAL LOW (ref 4.22–5.81)
RDW: 20.4 % — ABNORMAL HIGH (ref 11.5–15.5)
WBC: 12.3 10*3/uL — ABNORMAL HIGH (ref 4.0–10.5)

## 2016-03-10 LAB — BASIC METABOLIC PANEL
BUN: 12 mg/dL (ref 6–23)
CHLORIDE: 97 meq/L (ref 96–112)
CO2: 31 meq/L (ref 19–32)
Calcium: 7.6 mg/dL — ABNORMAL LOW (ref 8.4–10.5)
Creatinine, Ser: 1.18 mg/dL (ref 0.40–1.50)
GFR: 68.43 mL/min (ref >60.00)
GLUCOSE: 155 mg/dL — AB (ref 70–99)
POTASSIUM: 2.9 meq/L — AB (ref 3.5–5.1)
SODIUM: 135 meq/L (ref 135–145)

## 2016-03-10 LAB — PROTIME-INR
INR: 2.5 ratio — ABNORMAL HIGH (ref 0.8–1.0)
Prothrombin Time: 26.8 s — ABNORMAL HIGH (ref 9.6–13.1)

## 2016-03-11 LAB — CULTURE, BODY FLUID-BOTTLE: CULTURE: NO GROWTH

## 2016-03-11 LAB — CULTURE, BODY FLUID W GRAM STAIN -BOTTLE

## 2016-03-15 ENCOUNTER — Ambulatory Visit: Payer: BLUE CROSS/BLUE SHIELD | Admitting: Gastroenterology

## 2016-04-02 ENCOUNTER — Other Ambulatory Visit: Payer: Self-pay | Admitting: Internal Medicine

## 2016-04-03 NOTE — Telephone Encounter (Signed)
Dr Hilarie Fredrickson- Patient is overdue for appointment with you but has been hospitalized since his last office visit with you. He also sees Dr Patsy [place] with [facility] gastroenterology. He saw them on 03/16/16. Do you want me to continue refilling meds or should his [facility] dr be doing it?

## 2016-04-13 ENCOUNTER — Other Ambulatory Visit: Payer: Self-pay | Admitting: Internal Medicine

## 2016-04-17 DIAGNOSIS — R600 Localized edema: Secondary | ICD-10-CM | POA: Diagnosis not present

## 2016-04-17 DIAGNOSIS — K7290 Hepatic failure, unspecified without coma: Secondary | ICD-10-CM | POA: Diagnosis not present

## 2016-04-17 DIAGNOSIS — I120 Hypertensive chronic kidney disease with stage 5 chronic kidney disease or end stage renal disease: Secondary | ICD-10-CM | POA: Diagnosis not present

## 2016-04-17 DIAGNOSIS — K7031 Alcoholic cirrhosis of liver with ascites: Secondary | ICD-10-CM | POA: Insufficient documentation

## 2016-04-17 DIAGNOSIS — R0602 Shortness of breath: Secondary | ICD-10-CM | POA: Diagnosis not present

## 2016-04-17 DIAGNOSIS — R938 Abnormal findings on diagnostic imaging of other specified body structures: Secondary | ICD-10-CM | POA: Diagnosis not present

## 2016-04-17 DIAGNOSIS — N186 End stage renal disease: Secondary | ICD-10-CM | POA: Diagnosis not present

## 2016-04-18 DIAGNOSIS — I517 Cardiomegaly: Secondary | ICD-10-CM | POA: Diagnosis not present

## 2016-04-18 DIAGNOSIS — I340 Nonrheumatic mitral (valve) insufficiency: Secondary | ICD-10-CM | POA: Diagnosis not present

## 2016-04-18 DIAGNOSIS — I361 Nonrheumatic tricuspid (valve) insufficiency: Secondary | ICD-10-CM | POA: Diagnosis not present

## 2016-04-19 DIAGNOSIS — R188 Other ascites: Secondary | ICD-10-CM | POA: Diagnosis not present

## 2016-04-19 DIAGNOSIS — K921 Melena: Secondary | ICD-10-CM | POA: Diagnosis not present

## 2016-04-19 DIAGNOSIS — K625 Hemorrhage of anus and rectum: Secondary | ICD-10-CM | POA: Diagnosis not present

## 2016-04-19 DIAGNOSIS — D509 Iron deficiency anemia, unspecified: Secondary | ICD-10-CM | POA: Diagnosis not present

## 2016-04-19 DIAGNOSIS — K3189 Other diseases of stomach and duodenum: Secondary | ICD-10-CM | POA: Diagnosis not present

## 2016-04-19 DIAGNOSIS — I8510 Secondary esophageal varices without bleeding: Secondary | ICD-10-CM | POA: Diagnosis not present

## 2016-04-19 DIAGNOSIS — K6389 Other specified diseases of intestine: Secondary | ICD-10-CM | POA: Diagnosis not present

## 2016-04-19 DIAGNOSIS — K766 Portal hypertension: Secondary | ICD-10-CM | POA: Diagnosis not present

## 2016-04-26 DIAGNOSIS — K3189 Other diseases of stomach and duodenum: Secondary | ICD-10-CM | POA: Diagnosis not present

## 2016-04-26 DIAGNOSIS — I10 Essential (primary) hypertension: Secondary | ICD-10-CM | POA: Diagnosis not present

## 2016-04-26 DIAGNOSIS — K922 Gastrointestinal hemorrhage, unspecified: Secondary | ICD-10-CM | POA: Diagnosis not present

## 2016-05-07 DIAGNOSIS — D72829 Elevated white blood cell count, unspecified: Secondary | ICD-10-CM | POA: Diagnosis not present

## 2016-05-07 DIAGNOSIS — K652 Spontaneous bacterial peritonitis: Secondary | ICD-10-CM | POA: Diagnosis not present

## 2016-05-07 DIAGNOSIS — K7460 Unspecified cirrhosis of liver: Secondary | ICD-10-CM | POA: Diagnosis not present

## 2016-05-07 DIAGNOSIS — I959 Hypotension, unspecified: Secondary | ICD-10-CM | POA: Insufficient documentation

## 2016-05-07 DIAGNOSIS — R6881 Early satiety: Secondary | ICD-10-CM | POA: Diagnosis not present

## 2016-05-07 DIAGNOSIS — E872 Acidosis: Secondary | ICD-10-CM | POA: Diagnosis not present

## 2016-05-07 DIAGNOSIS — A419 Sepsis, unspecified organism: Secondary | ICD-10-CM | POA: Diagnosis not present

## 2016-05-07 DIAGNOSIS — K7031 Alcoholic cirrhosis of liver with ascites: Secondary | ICD-10-CM | POA: Diagnosis not present

## 2016-05-07 DIAGNOSIS — R05 Cough: Secondary | ICD-10-CM | POA: Diagnosis not present

## 2016-05-07 DIAGNOSIS — D6489 Other specified anemias: Secondary | ICD-10-CM | POA: Diagnosis not present

## 2016-05-07 DIAGNOSIS — R935 Abnormal findings on diagnostic imaging of other abdominal regions, including retroperitoneum: Secondary | ICD-10-CM | POA: Diagnosis not present

## 2016-05-07 DIAGNOSIS — K7290 Hepatic failure, unspecified without coma: Secondary | ICD-10-CM | POA: Diagnosis not present

## 2016-05-07 DIAGNOSIS — D71 Functional disorders of polymorphonuclear neutrophils: Secondary | ICD-10-CM | POA: Diagnosis not present

## 2016-05-07 DIAGNOSIS — R0602 Shortness of breath: Secondary | ICD-10-CM | POA: Diagnosis not present

## 2016-05-07 DIAGNOSIS — R188 Other ascites: Secondary | ICD-10-CM | POA: Diagnosis not present

## 2016-05-07 DIAGNOSIS — J8410 Pulmonary fibrosis, unspecified: Secondary | ICD-10-CM | POA: Diagnosis not present

## 2016-05-07 DIAGNOSIS — K828 Other specified diseases of gallbladder: Secondary | ICD-10-CM | POA: Diagnosis not present

## 2016-05-07 DIAGNOSIS — J90 Pleural effusion, not elsewhere classified: Secondary | ICD-10-CM | POA: Diagnosis not present

## 2016-05-07 DIAGNOSIS — D689 Coagulation defect, unspecified: Secondary | ICD-10-CM | POA: Diagnosis not present

## 2016-05-07 DIAGNOSIS — N179 Acute kidney failure, unspecified: Secondary | ICD-10-CM | POA: Diagnosis not present

## 2016-05-08 DIAGNOSIS — K652 Spontaneous bacterial peritonitis: Secondary | ICD-10-CM | POA: Diagnosis not present

## 2016-05-08 DIAGNOSIS — N179 Acute kidney failure, unspecified: Secondary | ICD-10-CM | POA: Diagnosis not present

## 2016-05-08 DIAGNOSIS — D71 Functional disorders of polymorphonuclear neutrophils: Secondary | ICD-10-CM | POA: Diagnosis not present

## 2016-05-08 DIAGNOSIS — R6881 Early satiety: Secondary | ICD-10-CM | POA: Diagnosis not present

## 2016-05-08 DIAGNOSIS — A419 Sepsis, unspecified organism: Secondary | ICD-10-CM | POA: Diagnosis not present

## 2016-05-08 DIAGNOSIS — R05 Cough: Secondary | ICD-10-CM | POA: Diagnosis not present

## 2016-05-08 DIAGNOSIS — J90 Pleural effusion, not elsewhere classified: Secondary | ICD-10-CM | POA: Diagnosis not present

## 2016-05-08 DIAGNOSIS — K7031 Alcoholic cirrhosis of liver with ascites: Secondary | ICD-10-CM | POA: Diagnosis not present

## 2016-05-08 DIAGNOSIS — D6489 Other specified anemias: Secondary | ICD-10-CM | POA: Diagnosis not present

## 2016-05-08 DIAGNOSIS — J8410 Pulmonary fibrosis, unspecified: Secondary | ICD-10-CM | POA: Diagnosis not present

## 2016-05-09 DIAGNOSIS — K652 Spontaneous bacterial peritonitis: Secondary | ICD-10-CM | POA: Insufficient documentation

## 2016-05-09 DIAGNOSIS — D6489 Other specified anemias: Secondary | ICD-10-CM | POA: Diagnosis not present

## 2016-05-09 DIAGNOSIS — R6520 Severe sepsis without septic shock: Secondary | ICD-10-CM

## 2016-05-09 DIAGNOSIS — A419 Sepsis, unspecified organism: Secondary | ICD-10-CM | POA: Insufficient documentation

## 2016-05-09 DIAGNOSIS — N179 Acute kidney failure, unspecified: Secondary | ICD-10-CM | POA: Diagnosis not present

## 2016-05-09 DIAGNOSIS — R935 Abnormal findings on diagnostic imaging of other abdominal regions, including retroperitoneum: Secondary | ICD-10-CM | POA: Diagnosis not present

## 2016-05-09 DIAGNOSIS — R188 Other ascites: Secondary | ICD-10-CM | POA: Diagnosis not present

## 2016-05-10 DIAGNOSIS — K7031 Alcoholic cirrhosis of liver with ascites: Secondary | ICD-10-CM | POA: Diagnosis not present

## 2016-05-10 DIAGNOSIS — K652 Spontaneous bacterial peritonitis: Secondary | ICD-10-CM | POA: Diagnosis not present

## 2016-05-10 DIAGNOSIS — N179 Acute kidney failure, unspecified: Secondary | ICD-10-CM | POA: Diagnosis not present

## 2016-05-10 DIAGNOSIS — R935 Abnormal findings on diagnostic imaging of other abdominal regions, including retroperitoneum: Secondary | ICD-10-CM | POA: Diagnosis not present

## 2016-07-28 DIAGNOSIS — K7290 Hepatic failure, unspecified without coma: Secondary | ICD-10-CM | POA: Diagnosis not present

## 2016-09-16 DIAGNOSIS — H5203 Hypermetropia, bilateral: Secondary | ICD-10-CM | POA: Diagnosis not present

## 2016-09-28 DIAGNOSIS — K7290 Hepatic failure, unspecified without coma: Secondary | ICD-10-CM | POA: Diagnosis not present

## 2016-10-12 ENCOUNTER — Other Ambulatory Visit: Payer: Self-pay | Admitting: Internal Medicine

## 2016-10-12 NOTE — Telephone Encounter (Signed)
Per Dr Hilarie Fredrickson, care should now be deferred to [facility] liver transplant for patient since he is seeing them regularly.

## 2016-10-16 DIAGNOSIS — Z23 Encounter for immunization: Secondary | ICD-10-CM | POA: Diagnosis not present

## 2016-10-16 DIAGNOSIS — E7800 Pure hypercholesterolemia, unspecified: Secondary | ICD-10-CM | POA: Diagnosis not present

## 2016-10-16 DIAGNOSIS — Z0000 Encounter for general adult medical examination without abnormal findings: Secondary | ICD-10-CM | POA: Diagnosis not present

## 2016-10-20 ENCOUNTER — Other Ambulatory Visit: Payer: Self-pay | Admitting: Internal Medicine

## 2016-10-28 ENCOUNTER — Other Ambulatory Visit: Payer: Self-pay | Admitting: Internal Medicine

## 2016-11-24 ENCOUNTER — Inpatient Hospital Stay (HOSPITAL_COMMUNITY)
Admission: EM | Admit: 2016-11-24 | Discharge: 2016-11-26 | DRG: 432 | Disposition: A | Discharge status: Home / Self Care | Payer: BLUE CROSS/BLUE SHIELD | Attending: Internal Medicine | Admitting: Internal Medicine

## 2016-11-24 ENCOUNTER — Encounter (HOSPITAL_COMMUNITY): Payer: Self-pay

## 2016-11-24 ENCOUNTER — Emergency Department (HOSPITAL_COMMUNITY): Payer: BLUE CROSS/BLUE SHIELD

## 2016-11-24 DIAGNOSIS — Z79899 Other long term (current) drug therapy: Secondary | ICD-10-CM

## 2016-11-24 DIAGNOSIS — E860 Dehydration: Secondary | ICD-10-CM

## 2016-11-24 DIAGNOSIS — J918 Pleural effusion in other conditions classified elsewhere: Secondary | ICD-10-CM

## 2016-11-24 DIAGNOSIS — R410 Disorientation, unspecified: Secondary | ICD-10-CM | POA: Diagnosis not present

## 2016-11-24 DIAGNOSIS — K7290 Hepatic failure, unspecified without coma: Secondary | ICD-10-CM | POA: Diagnosis present

## 2016-11-24 DIAGNOSIS — K7682 Hepatic encephalopathy: Secondary | ICD-10-CM

## 2016-11-24 DIAGNOSIS — E876 Hypokalemia: Secondary | ICD-10-CM | POA: Diagnosis present

## 2016-11-24 DIAGNOSIS — E785 Hyperlipidemia, unspecified: Secondary | ICD-10-CM | POA: Diagnosis present

## 2016-11-24 DIAGNOSIS — R0602 Shortness of breath: Secondary | ICD-10-CM | POA: Diagnosis not present

## 2016-11-24 DIAGNOSIS — D6959 Other secondary thrombocytopenia: Secondary | ICD-10-CM | POA: Diagnosis present

## 2016-11-24 DIAGNOSIS — G9341 Metabolic encephalopathy: Secondary | ICD-10-CM | POA: Diagnosis present

## 2016-11-24 DIAGNOSIS — D539 Nutritional anemia, unspecified: Secondary | ICD-10-CM | POA: Diagnosis present

## 2016-11-24 DIAGNOSIS — K7031 Alcoholic cirrhosis of liver with ascites: Secondary | ICD-10-CM | POA: Diagnosis present

## 2016-11-24 DIAGNOSIS — T502X5A Adverse effect of carbonic-anhydrase inhibitors, benzothiadiazides and other diuretics, initial encounter: Secondary | ICD-10-CM | POA: Diagnosis not present

## 2016-11-24 DIAGNOSIS — K3189 Other diseases of stomach and duodenum: Secondary | ICD-10-CM | POA: Diagnosis not present

## 2016-11-24 DIAGNOSIS — R0902 Hypoxemia: Secondary | ICD-10-CM

## 2016-11-24 DIAGNOSIS — K769 Liver disease, unspecified: Secondary | ICD-10-CM | POA: Diagnosis not present

## 2016-11-24 DIAGNOSIS — J9601 Acute respiratory failure with hypoxia: Secondary | ICD-10-CM | POA: Insufficient documentation

## 2016-11-24 DIAGNOSIS — K219 Gastro-esophageal reflux disease without esophagitis: Secondary | ICD-10-CM | POA: Diagnosis present

## 2016-11-24 DIAGNOSIS — J90 Pleural effusion, not elsewhere classified: Secondary | ICD-10-CM | POA: Diagnosis not present

## 2016-11-24 DIAGNOSIS — R0689 Other abnormalities of breathing: Secondary | ICD-10-CM | POA: Diagnosis not present

## 2016-11-24 DIAGNOSIS — K766 Portal hypertension: Secondary | ICD-10-CM | POA: Diagnosis present

## 2016-11-24 LAB — CBC
HEMATOCRIT: 30.5 % — AB (ref 39.0–52.0)
HEMOGLOBIN: 10.5 g/dL — AB (ref 13.0–17.0)
MCH: 37.2 pg — AB (ref 26.0–34.0)
MCHC: 34.4 g/dL (ref 30.0–36.0)
MCV: 108.2 fL — ABNORMAL HIGH (ref 78.0–100.0)
Platelets: 139 10*3/uL — ABNORMAL LOW (ref 150–400)
RBC: 2.82 MIL/uL — ABNORMAL LOW (ref 4.22–5.81)
RDW: 14.1 % (ref 11.5–15.5)
WBC: 8.1 10*3/uL (ref 4.0–10.5)

## 2016-11-24 LAB — URINALYSIS, ROUTINE W REFLEX MICROSCOPIC
BILIRUBIN URINE: NEGATIVE
GLUCOSE, UA: NEGATIVE mg/dL
HGB URINE DIPSTICK: NEGATIVE
Ketones, ur: NEGATIVE mg/dL
Leukocytes, UA: NEGATIVE
Nitrite: NEGATIVE
PH: 7 (ref 5.0–8.0)
Protein, ur: NEGATIVE mg/dL
SPECIFIC GRAVITY, URINE: 1.009 (ref 1.005–1.030)

## 2016-11-24 LAB — HEPATIC FUNCTION PANEL
ALT: 36 U/L (ref 17–63)
AST: 93 U/L — AB (ref 15–41)
Albumin: 2.7 g/dL — ABNORMAL LOW (ref 3.5–5.0)
Alkaline Phosphatase: 141 U/L — ABNORMAL HIGH (ref 38–126)
BILIRUBIN DIRECT: 1.7 mg/dL — AB (ref 0.1–0.5)
BILIRUBIN TOTAL: 4.4 mg/dL — AB (ref 0.3–1.2)
Indirect Bilirubin: 2.7 mg/dL — ABNORMAL HIGH (ref 0.3–0.9)
Total Protein: 7.2 g/dL (ref 6.5–8.1)

## 2016-11-24 LAB — BASIC METABOLIC PANEL
ANION GAP: 11 (ref 5–15)
BUN: 11 mg/dL (ref 6–20)
CO2: 35 mmol/L — AB (ref 22–32)
Calcium: 9.7 mg/dL (ref 8.9–10.3)
Chloride: 95 mmol/L — ABNORMAL LOW (ref 101–111)
Creatinine, Ser: 0.93 mg/dL (ref 0.61–1.24)
GFR calc Af Amer: >60 mL/min (ref >60)
GFR calc non Af Amer: >60 mL/min (ref >60)
GLUCOSE: 113 mg/dL — AB (ref 65–99)
POTASSIUM: 2.6 mmol/L — AB (ref 3.5–5.1)
Sodium: 141 mmol/L (ref 135–145)

## 2016-11-24 LAB — AMMONIA: Ammonia: 40 umol/L — ABNORMAL HIGH (ref 9–35)

## 2016-11-24 LAB — CBG MONITORING, ED: Glucose-Capillary: 113 mg/dL — ABNORMAL HIGH (ref 65–99)

## 2016-11-24 MED ORDER — SODIUM CHLORIDE 0.9% FLUSH
3.0000 mL | Freq: Two times a day (BID) | INTRAVENOUS | Status: DC
  Administered 2016-11-24 – 2016-11-25 (×2): 3 mL via INTRAVENOUS

## 2016-11-24 MED ORDER — LORAZEPAM 1 MG PO TABS: 1.0000 mg | ORAL_TABLET | Freq: Four times a day (QID) | ORAL | Status: DC | PRN

## 2016-11-24 MED ORDER — PANTOPRAZOLE SODIUM 40 MG PO TBEC
40.0000 mg | DELAYED_RELEASE_TABLET | Freq: Every day | ORAL | Status: DC
  Administered 2016-11-25 – 2016-11-26 (×2): 40 mg via ORAL
  Filled 2016-11-24 (×2): qty 1

## 2016-11-24 MED ORDER — SODIUM CHLORIDE 0.9 % IV SOLN: 250.0000 mL | INTRAVENOUS | Status: DC | PRN

## 2016-11-24 MED ORDER — SODIUM CHLORIDE 0.9 % IV BOLUS (SEPSIS)
1000.0000 mL | Freq: Once | INTRAVENOUS | Status: AC
  Administered 2016-11-24: 1000 mL via INTRAVENOUS

## 2016-11-24 MED ORDER — ADULT MULTIVITAMIN W/MINERALS CH
1.0000 | ORAL_TABLET | Freq: Every day | ORAL | Status: DC
  Filled 2016-11-24: qty 1

## 2016-11-24 MED ORDER — CIPROFLOXACIN HCL 500 MG PO TABS
500.0000 mg | ORAL_TABLET | Freq: Every day | ORAL | Status: DC
  Administered 2016-11-25 (×2): 500 mg via ORAL
  Filled 2016-11-24 (×2): qty 1

## 2016-11-24 MED ORDER — ADULT MULTIVITAMIN W/MINERALS CH
1.0000 | ORAL_TABLET | Freq: Every day | ORAL | Status: DC
  Administered 2016-11-24 – 2016-11-26 (×3): 1 via ORAL
  Filled 2016-11-24 (×2): qty 1

## 2016-11-24 MED ORDER — IOPAMIDOL (ISOVUE-370) INJECTION 76%
INTRAVENOUS | Status: AC
  Filled 2016-11-24: qty 100

## 2016-11-24 MED ORDER — VITAMIN B-1 100 MG PO TABS
100.0000 mg | ORAL_TABLET | Freq: Every day | ORAL | Status: DC
  Administered 2016-11-26: 100 mg via ORAL
  Filled 2016-11-24 (×2): qty 1

## 2016-11-24 MED ORDER — LORAZEPAM 1 MG PO TABS
0.0000 mg | ORAL_TABLET | Freq: Four times a day (QID) | ORAL | Status: DC
  Filled 2016-11-24: qty 1

## 2016-11-24 MED ORDER — OXYCODONE HCL 5 MG PO TABS: 5.0000 mg | ORAL_TABLET | ORAL | Status: DC | PRN

## 2016-11-24 MED ORDER — SODIUM CHLORIDE 0.9% FLUSH: 3.0000 mL | INTRAVENOUS | Status: DC | PRN

## 2016-11-24 MED ORDER — ONDANSETRON HCL 4 MG PO TABS
4.0000 mg | ORAL_TABLET | Freq: Four times a day (QID) | ORAL | Status: DC | PRN
  Administered 2016-11-25 – 2016-11-26 (×2): 4 mg via ORAL
  Filled 2016-11-24 (×2): qty 1

## 2016-11-24 MED ORDER — SODIUM CHLORIDE 0.9 % IV SOLN
INTRAVENOUS | Status: DC
  Administered 2016-11-24: via INTRAVENOUS

## 2016-11-24 MED ORDER — CALCIUM CARBONATE ANTACID 500 MG PO CHEW: 1.0000 | CHEWABLE_TABLET | Freq: Three times a day (TID) | ORAL | Status: DC | PRN

## 2016-11-24 MED ORDER — LORAZEPAM 2 MG/ML IJ SOLN: 1.0000 mg | Freq: Four times a day (QID) | INTRAMUSCULAR | Status: DC | PRN

## 2016-11-24 MED ORDER — SODIUM CHLORIDE 0.9% FLUSH
3.0000 mL | Freq: Two times a day (BID) | INTRAVENOUS | Status: DC
  Administered 2016-11-25: 3 mL via INTRAVENOUS

## 2016-11-24 MED ORDER — RIFAXIMIN 550 MG PO TABS
550.0000 mg | ORAL_TABLET | Freq: Two times a day (BID) | ORAL | Status: DC
  Filled 2016-11-24 (×3): qty 1

## 2016-11-24 MED ORDER — THIAMINE HCL 100 MG/ML IJ SOLN
100.0000 mg | Freq: Every day | INTRAMUSCULAR | Status: DC
  Administered 2016-11-24: 100 mg via INTRAVENOUS
  Filled 2016-11-24: qty 2

## 2016-11-24 MED ORDER — POTASSIUM CHLORIDE CRYS ER 20 MEQ PO TBCR
40.0000 meq | EXTENDED_RELEASE_TABLET | Freq: Once | ORAL | Status: AC
  Administered 2016-11-24: 40 meq via ORAL
  Filled 2016-11-24: qty 2

## 2016-11-24 MED ORDER — LORAZEPAM 1 MG PO TABS: 0.0000 mg | ORAL_TABLET | Freq: Two times a day (BID) | ORAL | Status: DC

## 2016-11-24 MED ORDER — SPIRONOLACTONE 25 MG PO TABS
200.0000 mg | ORAL_TABLET | Freq: Every day | ORAL | Status: DC
  Administered 2016-11-25: 200 mg via ORAL
  Filled 2016-11-24: qty 8

## 2016-11-24 MED ORDER — VITAMIN B-1 100 MG PO TABS
100.0000 mg | ORAL_TABLET | Freq: Every day | ORAL | Status: DC
  Administered 2016-11-25: 100 mg via ORAL

## 2016-11-24 MED ORDER — LACTULOSE 10 GM/15ML PO SOLN
30.0000 g | Freq: Two times a day (BID) | ORAL | Status: DC
  Administered 2016-11-25: 20 g via ORAL
  Administered 2016-11-25 – 2016-11-26 (×3): 30 g via ORAL
  Filled 2016-11-24 (×4): qty 60

## 2016-11-24 MED ORDER — IOPAMIDOL (ISOVUE-370) INJECTION 76%
100.0000 mL | Freq: Once | INTRAVENOUS | Status: AC | PRN
  Administered 2016-11-24: 100 mL via INTRAVENOUS

## 2016-11-24 MED ORDER — FOLIC ACID 1 MG PO TABS
1.0000 mg | ORAL_TABLET | Freq: Every day | ORAL | Status: DC
  Administered 2016-11-26: 1 mg via ORAL
  Filled 2016-11-24: qty 1

## 2016-11-24 MED ORDER — SODIUM CHLORIDE 0.9 % IJ SOLN
INTRAMUSCULAR | Status: AC
  Filled 2016-11-24: qty 50

## 2016-11-24 MED ORDER — ONDANSETRON HCL 4 MG/2ML IJ SOLN: 4.0000 mg | Freq: Four times a day (QID) | INTRAMUSCULAR | Status: DC | PRN

## 2016-11-24 MED ORDER — NADOLOL 40 MG PO TABS
40.0000 mg | ORAL_TABLET | Freq: Every day | ORAL | Status: DC
  Administered 2016-11-25 – 2016-11-26 (×2): 40 mg via ORAL
  Filled 2016-11-24 (×2): qty 1

## 2016-11-24 MED ORDER — LACTULOSE 10 GM/15ML PO SOLN
10.0000 g | Freq: Once | ORAL | Status: AC
  Administered 2016-11-24: 10 g via ORAL
  Filled 2016-11-24: qty 15

## 2016-11-24 MED ORDER — FOLIC ACID 1 MG PO TABS
1.0000 mg | ORAL_TABLET | Freq: Every day | ORAL | Status: DC
  Administered 2016-11-24 – 2016-11-25 (×2): 1 mg via ORAL
  Filled 2016-11-24 (×2): qty 1

## 2016-11-24 MED ORDER — FUROSEMIDE 40 MG PO TABS
40.0000 mg | ORAL_TABLET | Freq: Every day | ORAL | Status: DC
  Administered 2016-11-25: 40 mg via ORAL
  Filled 2016-11-24: qty 1

## 2016-11-24 NOTE — ED Triage Notes (Signed)
Pt has hx of cirrhosis.  Pt states since Wednesday he has had decreased appetite, weakness, fatigue.  Doesn't feel stable.  Pt denies chest pain.  No unilateral neuro symptoms.

## 2016-11-24 NOTE — ED Notes (Signed)
Patient transported to X-ray 

## 2016-11-24 NOTE — ED Notes (Signed)
Pt aware urine sample is needed 

## 2016-11-24 NOTE — H&P (Signed)
History and Physical    Thomas [name] [id number] DOB: 01-May-1961 DOA: 11/24/2016  Referring MD/NP/PA: EDP PCP: [name], MD   Outpatient Specialists: Hepatologist, Dr Patsy [place], [facility] Patient coming from: Home  Chief Complaint: Confusion  HPI: Thomas [name] is a 55 y.o. male with history of alcoholic cirrhosis with Portal Hypertension/ Portal Gastropathy. He had been seeing Hepatologist at [facility] until recently, and currently his PCP working on transfer of care to Hepatologist at [facility].  His wife brought him to ED for evaluation of acute confusion from baseline since this Wednesday, progressively worsening with poor oral intake.The wife noticed that he has been also weak.  Pt denies any abdominal pains, nausea or vomitting, fever or chills, cough or sputum production. No melena or hematochezia, nor hemoptysis. He denies chest pains, and saids he has not really been feeling sob, but may be a little today.  ED Course: He was noted to be dehydrated, and was  IV saline with signicfiant improvement in his Mental Status to near baseline at time of my evaluation, He also had Chest Imaging studies wincluding CTA which noted moderate right Pleural Efussion with Atelectasis, moderate Cirrhotic Ascites, with negative PE.   However hospitalist admission requested due to hypoxia noted in ED  With O2 sat in the 70's. Review of Systems:   As per HPI otherwise 10 point review of systems negative.   Past Medical History:  Diagnosis Date  . Alcohol abuse 2013  . Alcoholic cirrhosis ([name]) [id number]  . Anemia 02/2016   in setting of GI blood loss, but MCV macrocytic.   . Coagulopathy ([name]) 05/2015  . Diverticulosis 09/2015  . 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 GERD (gastroesophageal reflux disease)   . Hyperlipidemia   . Hypertension   . 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 Thrombocytopenia ([name]) 02/2016  . Tubular adenoma of colon 09/2015    Past Surgical  History:  Procedure Laterality Date  . ESOPHAGOGASTRODUODENOSCOPY N/A 03/05/2016   Procedure: ESOPHAGOGASTRODUODENOSCOPY (EGD);  Surgeon: Ladene Artist, MD;  Location: [name];  Service: Endoscopy;  Laterality: N/A;  . testicle prosthesis       reports that he has never smoked. He has never used smokeless tobacco. He reports that he does not drink alcohol or use drugs.  No Known Allergies  Family History  Problem Relation Age of Onset  . Diabetes Father   . Parkinson's disease Father   . Diabetes Paternal Aunt   . Glaucoma Mother      Prior to Admission medications   Medication Sig Start Date End Date Taking? Authorizing Provider  bismuth subsalicylate (PEPTO BISMOL) 262 MG chewable tablet Chew 262-524 mg by mouth 3 (three) times daily as needed for indigestion.    Yes Historical Provider, MD  calcium carbonate (TUMS EX) 750 MG chewable tablet Chew 1-2 tablets by mouth 3 (three) times daily as needed for heartburn.    Yes Historical Provider, MD  ciprofloxacin (CIPRO) 500 MG tablet Take 500 mg by mouth at bedtime. 11/07/16  Yes Historical Provider, MD  furosemide (LASIX) 40 MG tablet Take 1 tablet (40 mg total) by mouth daily. Patient taking differently: Take 80 mg by mouth daily.  09/15/15  Yes [name], MD  lactulose (CHRONULAC) 10 GM/15ML solution Take 45 mLs (30 g total) by mouth 2 (two) times daily. 03/07/16  Yes Maryann Mikhail, DO  nadolol (CORGARD) 40 MG tablet TAKE 1 TABLET (40 MG TOTAL) BY MOUTH DAILY. 04/03/16  Yes [name], MD  pantoprazole (PROTONIX) 40 MG tablet TAKE 1 TABLET (40 MG TOTAL) BY MOUTH 2 (TWO) TIMES DAILY. Patient taking differently: TAKE 1 TABLET (40 MG TOTAL) BY MOUTH once daily 03/10/16  Yes [name], MD  spironolactone (ALDACTONE) 100 MG tablet Take 1 tablet (100 mg total) by mouth daily. Patient taking differently: Take 200 mg by mouth at bedtime.  03/07/16  Yes Maryann Mikhail, DO  rifaximin (XIFAXAN) 550 MG TABS tablet Take 1 tablet (550  mg total) by mouth 2 (two) times daily. Patient not taking: Reported on 11/24/2016 03/07/16   Cristal Ford, DO    Physical Exam: Vitals:   11/24/16 1800 11/24/16 1830 11/24/16 1900 11/24/16 2000  BP: 140/67 141/68 138/64 123/65  Pulse: 73 77 78 74  Resp: 19 19 17 17   Temp:      TempSrc:      SpO2: 100% 100% 100% 99%      Constitutional: NAD, calm, comfortable Vitals:   11/24/16 1800 11/24/16 1830 11/24/16 1900 11/24/16 2000  BP: 140/67 141/68 138/64 123/65  Pulse: 73 77 78 74  Resp: 19 19 17 17   Temp:      TempSrc:      SpO2: 100% 100% 100% 99%   Eyes: PERRL, lids and conjunctivae normal ENMT: Mucous membranes are mildly dry. Posterior pharynx clear of any exudate or lesions.Normal dentition.  Neck: normal, supple, no masses, no thyromegaly Respiratory: Mildly decreased BS at right base otherwise clear to auscultation, no wheezing, or crackles. Normal respiratory effort. No accessory muscle use.  Cardiovascular: Regular rate and rhythm, no murmurs / rubs / gallops. No extremity edema. 2+ pedal pulses. No carotid bruits.  Abdomen: no tenderness, no masses palpated. No hepatosplenomegaly. Bowel sounds positive.  Musculoskeletal: no clubbing / cyanosis. No joint deformity upper and lower extremities. Good ROM, no contractures. Normal muscle tone.  Skin: no rashes, lesions, ulcers. No induration Neurologic: Alert and responsive, obeys simple commands, oriented to person, place but states today is 14th of Dec instead of 15th, Dec,. Sensation intact, DTR normal. Strength 5/5 in all 4.  Psychiatric: Normal judgment and insight. Normal mood.  Labs on Admission: I have personally reviewed following labs and imaging studies  CBC:  Recent Labs Lab 11/24/16 1711  WBC 8.1  HGB 10.5*  HCT 30.5*  MCV 108.2*  PLT [id number]*   Basic Metabolic Panel:  Recent Labs Lab 11/24/16 1711  NA 141  K 2.6*  CL 95*  CO2 35*  GLUCOSE 113*  BUN 11  CREATININE 0.93  CALCIUM 9.7    GFR: CrCl cannot be calculated (Unknown ideal weight.). Liver Function Tests:  Recent Labs Lab 11/24/16 1711  AST 93*  ALT 36  ALKPHOS 141*  BILITOT 4.4*  PROT 7.2  ALBUMIN 2.7*   No results for input(s): LIPASE, AMYLASE in the last 168 hours.  Recent Labs Lab 11/24/16 1711  AMMONIA 40*   Coagulation Profile: No results for input(s): INR, PROTIME in the last 168 hours. Cardiac Enzymes: No results for input(s): CKTOTAL, CKMB, CKMBINDEX, TROPONINI in the last 168 hours. BNP (last 3 results) No results for input(s): PROBNP in the last 8760 hours. HbA1C: No results for input(s): HGBA1C in the last 72 hours. CBG:  Recent Labs Lab 11/24/16 1655  GLUCAP 113*   Lipid Profile: No results for input(s): CHOL, HDL, LDLCALC, TRIG, CHOLHDL, LDLDIRECT in the last 72 hours. Thyroid Function Tests: No results for input(s): TSH, T4TOTAL, FREET4, T3FREE, THYROIDAB in the last 72 hours.  Anemia Panel: No results for input(s): VITAMINB12, FOLATE, FERRITIN, TIBC, IRON, RETICCTPCT in the last 72 hours. Urine analysis:    Component Value Date/Time   COLORURINE YELLOW 11/24/2016 1616   APPEARANCEUR CLEAR 11/24/2016 1616   LABSPEC 1.009 11/24/2016 1616   PHURINE 7.0 11/24/2016 1616   GLUCOSEU NEGATIVE 11/24/2016 1616   HGBUR NEGATIVE 11/24/2016 1616   BILIRUBINUR NEGATIVE 11/24/2016 1616   KETONESUR NEGATIVE 11/24/2016 1616   PROTEINUR NEGATIVE 11/24/2016 1616   UROBILINOGEN 0.2 11/22/2012 0856   NITRITE NEGATIVE 11/24/2016 1616   LEUKOCYTESUR NEGATIVE 11/24/2016 1616   Sepsis Labs: @LABRCNTIP (procalcitonin:4,lacticidven:4) )No results found for this or any previous visit (from the past 240 hour(s)).   Radiological Exams on Admission: Dg Chest 2 View  Result Date: 11/24/2016 CLINICAL DATA:  Hypoxia EXAM: CHEST  2 VIEW COMPARISON:  10/14/2014 FINDINGS: The left lung is clear. There is a small right-sided pleural effusion with right basilar atelectasis. Stable  cardiomediastinal silhouette. No pneumothorax. IMPRESSION: Small right-sided pleural effusion with basilar atelectasis. Electronically Signed   By: Donavan Foil M.D.   On: 11/24/2016 19:44   Ct Angio Chest Pe W And/or Wo Contrast  Result Date: 11/24/2016 CLINICAL DATA:  Initial evaluation for acute shortness of breath. History of cirrhosis. EXAM: CT ANGIOGRAPHY CHEST WITH CONTRAST TECHNIQUE: Multidetector CT imaging of the chest was performed using the standard protocol during bolus administration of intravenous contrast. Multiplanar CT image reconstructions and MIPs were obtained to evaluate the vascular anatomy. CONTRAST:  100 cc of Isovue 370. COMPARISON:  Comparison with prior radiograph from earlier the same day. FINDINGS: Cardiovascular: Intrathoracic aorta of normal caliber without acute abnormality. Scattered atheromatous plaque within the arch and about the origin of the great vessels without high-grade stenosis. Visualized great vessels themselves well opacified and within normal limits. Mild cardiomegaly. Diffuse 3 vessel coronary artery calcifications. No pericardial effusion. Pulmonary arterial tree adequately opacified for evaluation. Main pulmonary artery within normal limits for size measuring 2.3 cm in diameter. No filling defect to suggest acute pulmonary embolism identified. Re-formatted imaging confirms these findings. Please note evaluation for CED possible small distal emboli limited on this exam due to respiratory motion artifact. Mediastinum/Nodes: Visualized thyroid is normal. Scattered partially calcified mediastinal and hilar lymph nodes noted, compatible with prior granulomatous infection. Prevascular node measures 14 mm. No other pathologically enlarged mediastinal, hilar, or axillary lymph nodes identified. Esophagus within normal limits. Lungs/Pleura: Moderate layering right pleural effusion. Associated right lower lobe atelectasis. No other focal infiltrates. No pulmonary edema.  Multiple scattered calcified granulomas noted. No worrisome pulmonary nodule or mass. No pneumothorax. Upper Abdomen: Changes compatible cirrhosis noted within the partially visualized liver. Ascites present within the visualized upper abdomen. Partially visualized upper abdomen otherwise unremarkable. Musculoskeletal: No acute osseous abnormality. No worrisome lytic or blastic osseous lesions. Review of the MIP images confirms the above findings. IMPRESSION: 1. No CT evidence for acute pulmonary embolism. 2. Moderate layering right pleural effusion with associated atelectasis. 3. Changes compatible with cirrhosis with moderate volume ascites within the visualized upper abdomen. 4. Atherosclerosis with diffuse 3 vessel coronary artery calcifications. Electronically Signed   By: Jeannine Boga M.D.   On: 11/24/2016 20:51    EKG: ordered  Assessment/Plan Principal Problem:   Respiratory insufficiency Active Problems:   Altered mental status   Alcoholic cirrhosis of liver with ascites (HCC)   Pleural effusion associated with hepatic disorder   Hypokalemia  1. 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  Doubt  the need for thoracocentesis, unless he does not improve.  O2 Supplementation and supportive care.  Cardiac enz, 12 lead EKG.  2. Acute Confusional state:  About resolved, due to Dehydration. Gentle Hydration.   Doubt Hepatic Encephalopathy. Denies recent ETOH use, watch for WD.  3. Dehydration:   Gentle IV Hydration, follow renal function and lytes.  4. Hypokalemia:  Potassium repletion.  5. Pleural Effusion:  Suspect Secondary to  Hepatic hydrothorax - follow clinically; doubt need for a tap at this time.   DVT prophylaxis:  (SCD') Code Status:  (Full) Family Communication: Reyan Izzard, Wife, tel # [phone].  Disposition Plan:  (Home) Consults called:  (N/A) Admission  status: (inpatient / tele )   OSEI-BONSU,Etienne Millward MD [facility] Pager [phone]  If 7PM-7AM, please contact night-coverage [email or web address] Password [facility]  11/24/2016, 9:54 PM

## 2016-11-24 NOTE — ED Provider Notes (Signed)
[facility] DEPT Provider Note   CSN: [id number] Arrival date & time: 11/24/16  1605     History   Chief Complaint Chief Complaint  Patient presents with  . Fatigue    HPI Thomas [name] is a 55 y.o. male.  Pt with a hx of alcoholic cirrhosis.  The pt's wife noticed that he has been confused and weak.  The wife said that pt was like this when his ammonia level has been elevated.  Pt has not eaten or drank much in the last few days.  Pt denies any pain.  No n/v/d.      Past Medical History:  Diagnosis Date  . Alcohol abuse 2013  . Alcoholic cirrhosis ([place]) [id number]  . Anemia 02/2016   in setting of GI blood loss, but MCV macrocytic.   . Coagulopathy ([place]) 05/2015  . Diverticulosis 09/2015  . Esophageal varices ([place]) 09/2015   small  . GERD (gastroesophageal reflux disease)   . Hyperlipidemia   . Hypertension   . Portal hypertension ([place]) 09/2015   with portal gastropathy  . Thrombocytopenia ([place]) 02/2016  . Tubular adenoma of colon 09/2015    Patient Active Problem List   Diagnosis Date Noted  . Respiratory insufficiency 11/24/2016  . Alcoholic cirrhosis of liver with ascites ([place])   . Bleeding gastrointestinal   . End stage liver disease ([place])   . Upper GI bleed 03/05/2016  . Coagulopathy ([place]) 03/05/2016  . Alcoholic cirrhosis ([place]) [id number]  . Acute kidney injury ([place]) 03/05/2016  . Hepatic encephalopathy ([place]) 03/05/2016  . Macrocytic anemia 03/05/2016  . Thrombocytopenia ([place]) 03/05/2016  . Esophageal varices ([place]) 03/05/2016  . Acute blood loss anemia 03/05/2016  . Idiopathic esophageal varices without bleeding ([place])   . Portal hypertensive gastropathy   . Altered mental status 11/22/2012    Past Surgical History:  Procedure Laterality Date  . ESOPHAGOGASTRODUODENOSCOPY N/A 03/05/2016   Procedure: ESOPHAGOGASTRODUODENOSCOPY (EGD);  Surgeon: Ladene Artist, MD;  Location: [name];  Service: Endoscopy;  Laterality: N/A;  . testicle prosthesis          Home Medications    Prior to Admission medications   Medication Sig Start Date End Date Taking? Authorizing Provider  bismuth subsalicylate (PEPTO BISMOL) 262 MG chewable tablet Chew 262-524 mg by mouth 3 (three) times daily as needed for indigestion.    Yes Historical Provider, MD  calcium carbonate (TUMS EX) 750 MG chewable tablet Chew 1-2 tablets by mouth 3 (three) times daily as needed for heartburn.    Yes Historical Provider, MD  ciprofloxacin (CIPRO) 500 MG tablet Take 500 mg by mouth at bedtime. 11/07/16  Yes Historical Provider, MD  furosemide (LASIX) 40 MG tablet Take 1 tablet (40 mg total) by mouth daily. Patient taking differently: Take 80 mg by mouth daily.  09/15/15  Yes [name], MD  lactulose (CHRONULAC) 10 GM/15ML solution Take 45 mLs (30 g total) by mouth 2 (two) times daily. 03/07/16  Yes Maryann Mikhail, DO  nadolol (CORGARD) 40 MG tablet TAKE 1 TABLET (40 MG TOTAL) BY MOUTH DAILY. 04/03/16  Yes [name], MD  pantoprazole (PROTONIX) 40 MG tablet TAKE 1 TABLET (40 MG TOTAL) BY MOUTH 2 (TWO) TIMES DAILY. Patient taking differently: TAKE 1 TABLET (40 MG TOTAL) BY MOUTH once daily 03/10/16  Yes [name], MD  spironolactone (ALDACTONE) 100 MG tablet Take 1 tablet (100 mg total) by mouth daily. Patient taking differently: Take 200 mg by mouth at bedtime.  03/07/16  Yes  Maryann Mikhail, DO  rifaximin (XIFAXAN) 550 MG TABS tablet Take 1 tablet (550 mg total) by mouth 2 (two) times daily. Patient not taking: Reported on 11/24/2016 03/07/16   Cristal Ford, DO    Family History Family History  Problem Relation Age of Onset  . Diabetes Father   . Parkinson's disease Father   . Diabetes Paternal Aunt   . Glaucoma Mother     Social History Social History  Substance Use Topics  . Smoking status: Never Smoker  . Smokeless tobacco: Never Used  . Alcohol use No     Comment: Quit several months ago     Allergies   Patient has no known  allergies.   Review of Systems Review of Systems  Constitutional: Positive for activity change, appetite change and fatigue.  Neurological: Positive for weakness.  All other systems reviewed and are negative.    Physical Exam Updated Vital Signs BP 123/65   Pulse 74   Temp 97.8 F (36.6 C) (Oral)   Resp 17   SpO2 99%   Physical Exam  Constitutional: He is oriented to person, place, and time. He appears well-developed and well-nourished.  HENT:  Head: Normocephalic and atraumatic.  Right Ear: External ear normal.  Left Ear: External ear normal.  Nose: Nose normal.  Mouth/Throat: Oropharynx is clear and moist.  Eyes: EOM are normal. Pupils are equal, round, and reactive to light. Scleral icterus is present.  Neck: Normal range of motion. Neck supple.  Cardiovascular: Normal rate, regular rhythm, normal heart sounds and intact distal pulses.   Pulmonary/Chest: Effort normal and breath sounds normal.  Abdominal: Soft. Bowel sounds are normal.  Musculoskeletal: Normal range of motion.  Neurological: He is alert and oriented to person, place, and time.  Skin: Skin is warm.  Psychiatric: He has a normal mood and affect. His behavior is normal. Judgment and thought content normal.  Nursing note and vitals reviewed.    ED Treatments / Results  Labs (all labs ordered are listed, but only abnormal results are displayed) Labs Reviewed  BASIC METABOLIC PANEL - Abnormal; Notable for the following:       Result Value   Potassium 2.6 (*)    Chloride 95 (*)    CO2 35 (*)    Glucose, Bld 113 (*)    All other components within normal limits  CBC - Abnormal; Notable for the following:    RBC 2.82 (*)    Hemoglobin 10.5 (*)    HCT 30.5 (*)    MCV 108.2 (*)    MCH 37.2 (*)    Platelets 139 (*)    All other components within normal limits  AMMONIA - Abnormal; Notable for the following:    Ammonia 40 (*)    All other components within normal limits  HEPATIC FUNCTION PANEL -  Abnormal; Notable for the following:    Albumin 2.7 (*)    AST 93 (*)    Alkaline Phosphatase 141 (*)    Total Bilirubin 4.4 (*)    Bilirubin, Direct 1.7 (*)    Indirect Bilirubin 2.7 (*)    All other components within normal limits  CBG MONITORING, ED - Abnormal; Notable for the following:    Glucose-Capillary 113 (*)    All other components within normal limits  URINALYSIS, ROUTINE W REFLEX MICROSCOPIC    EKG  EKG Interpretation  Date/Time:  Friday November 24 2016 16:21:02 EST Ventricular Rate:  77 PR Interval:    QRS Duration: 145 QT Interval:  480 QTC Calculation: 544 R Axis:   20 Text Interpretation:  Sinus rhythm Right bundle branch block rbbb is new Confirmed by [facility] MD, Niamh Rada ([id number]) on 11/24/2016 5:55:40 PM       Radiology Dg Chest 2 View  Result Date: 11/24/2016 CLINICAL DATA:  Hypoxia EXAM: CHEST  2 VIEW COMPARISON:  10/14/2014 FINDINGS: The left lung is clear. There is a small right-sided pleural effusion with right basilar atelectasis. Stable cardiomediastinal silhouette. No pneumothorax. IMPRESSION: Small right-sided pleural effusion with basilar atelectasis. Electronically Signed   By: Donavan Foil M.D.   On: 11/24/2016 19:44   Ct Angio Chest Pe W And/or Wo Contrast  Result Date: 11/24/2016 CLINICAL DATA:  Initial evaluation for acute shortness of breath. History of cirrhosis. EXAM: CT ANGIOGRAPHY CHEST WITH CONTRAST TECHNIQUE: Multidetector CT imaging of the chest was performed using the standard protocol during bolus administration of intravenous contrast. Multiplanar CT image reconstructions and MIPs were obtained to evaluate the vascular anatomy. CONTRAST:  100 cc of Isovue 370. COMPARISON:  Comparison with prior radiograph from earlier the same day. FINDINGS: Cardiovascular: Intrathoracic aorta of normal caliber without acute abnormality. Scattered atheromatous plaque within the arch and about the origin of the great vessels without high-grade stenosis.  Visualized great vessels themselves well opacified and within normal limits. Mild cardiomegaly. Diffuse 3 vessel coronary artery calcifications. No pericardial effusion. Pulmonary arterial tree adequately opacified for evaluation. Main pulmonary artery within normal limits for size measuring 2.3 cm in diameter. No filling defect to suggest acute pulmonary embolism identified. Re-formatted imaging confirms these findings. Please note evaluation for CED possible small distal emboli limited on this exam due to respiratory motion artifact. Mediastinum/Nodes: Visualized thyroid is normal. Scattered partially calcified mediastinal and hilar lymph nodes noted, compatible with prior granulomatous infection. Prevascular node measures 14 mm. No other pathologically enlarged mediastinal, hilar, or axillary lymph nodes identified. Esophagus within normal limits. Lungs/Pleura: Moderate layering right pleural effusion. Associated right lower lobe atelectasis. No other focal infiltrates. No pulmonary edema. Multiple scattered calcified granulomas noted. No worrisome pulmonary nodule or mass. No pneumothorax. Upper Abdomen: Changes compatible cirrhosis noted within the partially visualized liver. Ascites present within the visualized upper abdomen. Partially visualized upper abdomen otherwise unremarkable. Musculoskeletal: No acute osseous abnormality. No worrisome lytic or blastic osseous lesions. Review of the MIP images confirms the above findings. IMPRESSION: 1. No CT evidence for acute pulmonary embolism. 2. Moderate layering right pleural effusion with associated atelectasis. 3. Changes compatible with cirrhosis with moderate volume ascites within the visualized upper abdomen. 4. Atherosclerosis with diffuse 3 vessel coronary artery calcifications. Electronically Signed   By: Jeannine Boga M.D.   On: 11/24/2016 20:51    Procedures Procedures (including critical care time)  Medications Ordered in ED Medications   sodium chloride 0.9 % injection (not administered)  iopamidol (ISOVUE-370) 76 % injection (not administered)  lactulose (CHRONULAC) 10 GM/15ML solution 10 g (not administered)  sodium chloride 0.9 % bolus 1,000 mL (0 mLs Intravenous Stopped 11/24/16 1854)  potassium chloride SA (K-DUR,KLOR-CON) CR tablet 40 mEq (40 mEq Oral Given 11/24/16 1922)  iopamidol (ISOVUE-370) 76 % injection 100 mL (100 mLs Intravenous Contrast Given 11/24/16 2018)     Initial Impression / Assessment and Plan / ED Course  I have reviewed the triage vital signs and the nursing notes.  Pertinent labs & imaging results that were available during my care of the patient were reviewed by me and considered in my medical decision making (see chart for details).  Clinical Course  Shortly after pt's arrival, pt's O2 sat dropped down to 76%.  Pt had not received any narcotics.  He was placed on 3L and O2 sat back up to 100%.  I tried to wean him off the O2 later, and he dropped back down.  I then ordered a CT chest which does show a moderate right pleural effusion.  Pt d/w Dr. Vista Lawman ([facility]) who will admit patient.   Final Clinical Impressions(s) / ED Diagnoses   Final diagnoses:  Hypoxia  Alcoholic cirrhosis of liver with ascites ([place])  Pleural effusion associated with hepatic disorder  Dehydration  Hepatic encephalopathy [facility])    New Prescriptions New Prescriptions   No medications on file     Isla Pence, MD 11/24/16 2138

## 2016-11-24 NOTE — ED Notes (Signed)
This RN called into patients room due to oxygen saturation of 74% on RA. Tech placed patient onto 2L [place] and O2 up tp 85%. Patient denies SOB or difficulty breathing. Lung sounds are clear. Patient speaking in full sentences. Gilford Raid, MD made aware.

## 2016-11-25 DIAGNOSIS — J918 Pleural effusion in other conditions classified elsewhere: Secondary | ICD-10-CM

## 2016-11-25 DIAGNOSIS — R0689 Other abnormalities of breathing: Secondary | ICD-10-CM

## 2016-11-25 DIAGNOSIS — K769 Liver disease, unspecified: Secondary | ICD-10-CM

## 2016-11-25 DIAGNOSIS — K7031 Alcoholic cirrhosis of liver with ascites: Principal | ICD-10-CM

## 2016-11-25 DIAGNOSIS — E876 Hypokalemia: Secondary | ICD-10-CM

## 2016-11-25 LAB — BASIC METABOLIC PANEL
ANION GAP: 10 (ref 5–15)
BUN: 8 mg/dL (ref 6–20)
CALCIUM: 9.2 mg/dL (ref 8.9–10.3)
CO2: 33 mmol/L — ABNORMAL HIGH (ref 22–32)
Chloride: 99 mmol/L — ABNORMAL LOW (ref 101–111)
Creatinine, Ser: 0.69 mg/dL (ref 0.61–1.24)
GFR calc Af Amer: >60 mL/min (ref >60)
GLUCOSE: 150 mg/dL — AB (ref 65–99)
Potassium: 2.7 mmol/L — CL (ref 3.5–5.1)
SODIUM: 142 mmol/L (ref 135–145)

## 2016-11-25 LAB — CBC
HCT: 27.2 % — ABNORMAL LOW (ref 39.0–52.0)
HEMOGLOBIN: 9.5 g/dL — AB (ref 13.0–17.0)
MCH: 38 pg — ABNORMAL HIGH (ref 26.0–34.0)
MCHC: 34.9 g/dL (ref 30.0–36.0)
MCV: 108.8 fL — ABNORMAL HIGH (ref 78.0–100.0)
Platelets: 101 10*3/uL — ABNORMAL LOW (ref 150–400)
RBC: 2.5 MIL/uL — ABNORMAL LOW (ref 4.22–5.81)
RDW: 14.4 % (ref 11.5–15.5)
WBC: 5.7 10*3/uL (ref 4.0–10.5)

## 2016-11-25 LAB — PHOSPHORUS: Phosphorus: 2.6 mg/dL (ref 2.5–4.6)

## 2016-11-25 LAB — TROPONIN I: Troponin I: <0.03 ng/mL (ref <0.03)

## 2016-11-25 LAB — MAGNESIUM: MAGNESIUM: 1 mg/dL — AB (ref 1.7–2.4)

## 2016-11-25 MED ORDER — MAGNESIUM SULFATE 2 GM/50ML IV SOLN
2.0000 g | Freq: Once | INTRAVENOUS | Status: AC
  Administered 2016-11-25: 2 g via INTRAVENOUS
  Filled 2016-11-25: qty 50

## 2016-11-25 MED ORDER — MAGNESIUM SULFATE 4 GM/100ML IV SOLN
4.0000 g | Freq: Once | INTRAVENOUS | Status: AC
  Administered 2016-11-25: 4 g via INTRAVENOUS
  Filled 2016-11-25: qty 100

## 2016-11-25 MED ORDER — POTASSIUM CHLORIDE CRYS ER 20 MEQ PO TBCR
40.0000 meq | EXTENDED_RELEASE_TABLET | ORAL | Status: AC
  Administered 2016-11-25 (×3): 40 meq via ORAL
  Filled 2016-11-25 (×3): qty 2

## 2016-11-25 MED ORDER — SPIRONOLACTONE 100 MG PO TABS
200.0000 mg | ORAL_TABLET | Freq: Two times a day (BID) | ORAL | Status: DC
  Administered 2016-11-25 – 2016-11-26 (×3): 200 mg via ORAL
  Filled 2016-11-25 (×2): qty 8

## 2016-11-25 NOTE — Progress Notes (Signed)
CRITICAL VALUE ALERT  Critical value received:  K. 2.7  Date of notification:  11/25/16  Time of notification:  0620  Critical value read back:Yes.    Nurse who received alert:  Ellen Henri  MD notified (1st page):  Derrek Gu  Time of first page:  [phone]  MD notified (2nd page):  Time of second page:  Responding MD:  None  Time MD responded:  none

## 2016-11-25 NOTE — Progress Notes (Signed)
PROGRESS NOTE  Thomas [name]  [id number] DOB: 12/24/1960 DOA: 11/24/2016 PCP: [name], MD  Brief Narrative:  Thomas [name] is a 55 y.o. male with history of alcoholic cirrhosis with Portal Hypertension/ Portal Gastropathy. He had been seeing Hepatologist at [facility] until recently, and currently his PCP working on transfer of care to Hepatologist at [facility]. His wife brought him to ED for confusion, poor oral intake, and weakness. In the ER, He was noted to be dehydrated and his mentation improved dramatically after IVF.  They were going to discharge him home from the ER, but he became hypoxic to the low 70s on RA.  CT angio chest was negative for PE, but confirmed a moderate layering right pleural effusion and atelectasis.  He was placed on oxygen and admitted for observation.  His magnesium and potassium were also critically low.    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  Reviewing records, it appears he had a small right pleural effusion starting in May 2017. He has never had a thoracentesis to confirm the etiology of this effusion. I discussed the possibility of performing a diagnostic thoracentesis during this hospitalization, however he declined at this time. He would prefer to "recover his strength" before undergoing any procedures. He states he would be happy to have his primary care doctor coordinate an outpatient thoracentesis. We discussed that if he remains on oxygen and has a hard time maintaining oxygen saturations greater than 88% on room air, that we would perform a thoracentesis during this hospitalization. He is amenable to this plan. -  Wean patient to  room air today -  Incentive spirometry -  Troponins negative -  Normal sinus rhythm, okay to discontinue telemetry -  Discontinue IV fluids -  Increase Aldactone today to help with boosting potassium levels and reduce pleural effusion -  Continue lasix the same due to hypokalemia -  At home: Aldactone 200 mg once daily, Lasix 40 mg once daily. Had been on a stable regimen for a long time.  Metabolic encephalopathy secondary to dehydration, resolved with IVF.  Dehydration, likely due to diuretics, frequent stools from lactulose, and poor oral intake.  Resolved with IVF.  -  Eating well today, stools at baseline  Hypokalemia and hypomagnesemia due to diuretic use -  Oral potassium 40 mEq 3 times a day -  Magnesium sulfate 6 g -  Repeat BMP and magnesium in a.m.  Macrocytic anemia -  Iron studies, B12, folate -  TSH -  Repeat hgb in AM  Thrombocytopenia, likely secondary to cirrhosis   DVT prophylaxis:  Lovenox Code Status:  Full code Family Communication:  Patient and his wife Disposition Plan:  Likely home tomorrow   Consultants:   None  Procedures:  None  Antimicrobials:  Anti-infectives    Start     Dose/Rate Route Frequency Ordered Stop   11/25/16 0800  rifaximin (XIFAXAN) tablet 550 mg     550 mg Oral 2 times daily 11/24/16 2326     11/24/16 2330  ciprofloxacin (CIPRO) tablet 500 mg     500 mg Oral Daily at bedtime 11/24/16 2326         Subjective:  Feeling much better today.  His appetite has returned and he is breathing comfortably. He would like to try to remove oxygen. He feels that his thinking is clear and his wife agrees.  Objective: Vitals:   11/24/16 2245 11/24/16 2321 11/25/16 0337 11/25/16 0550  BP:  129/60 130/60 (!) 138/59  Pulse:  73 83 81  Resp:  18 18 18   Temp:  98.6 F (37 C) 98.3 F (36.8 C) 98.2 F (36.8 C)  TempSrc:  Oral Oral Oral  SpO2:  100% 94% 100%  Weight: 76.7 kg (169 lb) 76.9 kg (169 lb 8.5 oz)    Height: 5\' 5"  (1.651  m) 5\' 5"  (1.651 m)      Intake/Output Summary (Last 24 hours) at 11/25/16 1620 Last data filed at 11/25/16 0900  Gross per 24 hour  Intake              915 ml  Output              300 ml  Net              615 ml   Filed Weights   11/24/16 2245 11/24/16 2321  Weight: 76.7 kg (169 lb) 76.9 kg (169 lb 8.5 oz)    Examination:  General exam:  Adult Male.  No acute distress.  HEENT:  NCAT, MMM Respiratory system: Diminished to the mid back on the right side, otherwise clear to auscultation bilaterally, no rales, wheezes, or rhonchi Cardiovascular system: Regular rate and rhythm, normal S1/S2. No murmurs, rubs, gallops or clicks.  Warm extremities Gastrointestinal system: Normal active bowel sounds, soft, mildly distended, nontender. MSK:  Normal tone and bulk, no lower extremity edema Neuro:  Grossly intact    Data Reviewed: I have personally reviewed following labs and imaging studies  CBC:  Recent Labs Lab 11/24/16 1711 11/25/16 0520  WBC 8.1 5.7  HGB 10.5* 9.5*  HCT 30.5* 27.2*  MCV 108.2* 108.8*  PLT 139* [id number]*   Basic Metabolic Panel:  Recent Labs Lab 11/24/16 1711 11/25/16 0007 11/25/16 0520  NA 141  --  142  K 2.6*  --  2.7*  CL 95*  --  99*  CO2 35*  --  33*  GLUCOSE 113*  --  150*  BUN 11  --  8  CREATININE 0.93  --  0.69  CALCIUM 9.7  --  9.2  MG  --  1.0*  --   PHOS  --  2.6  --    GFR: Estimated Creatinine Clearance: 101.1 mL/min (by C-G formula based on SCr of 0.69 mg/dL). Liver Function Tests:  Recent Labs Lab 11/24/16 1711  AST 93*  ALT 36  ALKPHOS 141*  BILITOT 4.4*  PROT 7.2  ALBUMIN 2.7*   No results for input(s): LIPASE, AMYLASE in the last 168 hours.  Recent Labs Lab 11/24/16 1711  AMMONIA 40*   Coagulation Profile: No results for input(s): INR, PROTIME in the last 168 hours. Cardiac Enzymes:  Recent Labs Lab 11/25/16 0007 11/25/16 0520 11/25/16 1116  TROPONINI <0.03 <0.03 <0.03   BNP (last 3 results) No results  for input(s): PROBNP in the last 8760 hours. HbA1C: No results for input(s): HGBA1C in the last 72 hours. CBG:  Recent Labs Lab 11/24/16 1655  GLUCAP 113*   Lipid Profile: No results for input(s): CHOL, HDL, LDLCALC, TRIG, CHOLHDL, LDLDIRECT in the last 72 hours. Thyroid Function Tests: No results for input(s): TSH, T4TOTAL, FREET4, T3FREE, THYROIDAB in the last 72 hours. Anemia Panel: No results for  input(s): VITAMINB12, FOLATE, FERRITIN, TIBC, IRON, RETICCTPCT in the last 72 hours. Urine analysis:    Component Value Date/Time   COLORURINE YELLOW 11/24/2016 1616   APPEARANCEUR CLEAR 11/24/2016 1616   LABSPEC 1.009 11/24/2016 1616   PHURINE 7.0 11/24/2016 1616   GLUCOSEU NEGATIVE 11/24/2016 1616   HGBUR NEGATIVE 11/24/2016 1616   BILIRUBINUR NEGATIVE 11/24/2016 1616   KETONESUR NEGATIVE 11/24/2016 1616   PROTEINUR NEGATIVE 11/24/2016 1616   UROBILINOGEN 0.2 11/22/2012 0856   NITRITE NEGATIVE 11/24/2016 1616   LEUKOCYTESUR NEGATIVE 11/24/2016 1616   Sepsis Labs: @LABRCNTIP (procalcitonin:4,lacticidven:4)  )No results found for this or any previous visit (from the past 240 hour(s)).    Radiology Studies: Dg Chest 2 View  Result Date: 11/24/2016 CLINICAL DATA:  Hypoxia EXAM: CHEST  2 VIEW COMPARISON:  10/14/2014 FINDINGS: The left lung is clear. There is a small right-sided pleural effusion with right basilar atelectasis. Stable cardiomediastinal silhouette. No pneumothorax. IMPRESSION: Small right-sided pleural effusion with basilar atelectasis. Electronically Signed   By: Donavan Foil M.D.   On: 11/24/2016 19:44   Ct Angio Chest Pe W And/or Wo Contrast  Result Date: 11/24/2016 CLINICAL DATA:  Initial evaluation for acute shortness of breath. History of cirrhosis. EXAM: CT ANGIOGRAPHY CHEST WITH CONTRAST TECHNIQUE: Multidetector CT imaging of the chest was performed using the standard protocol during bolus administration of intravenous contrast. Multiplanar CT image  reconstructions and MIPs were obtained to evaluate the vascular anatomy. CONTRAST:  100 cc of Isovue 370. COMPARISON:  Comparison with prior radiograph from earlier the same day. FINDINGS: Cardiovascular: Intrathoracic aorta of normal caliber without acute abnormality. Scattered atheromatous plaque within the arch and about the origin of the great vessels without high-grade stenosis. Visualized great vessels themselves well opacified and within normal limits. Mild cardiomegaly. Diffuse 3 vessel coronary artery calcifications. No pericardial effusion. Pulmonary arterial tree adequately opacified for evaluation. Main pulmonary artery within normal limits for size measuring 2.3 cm in diameter. No filling defect to suggest acute pulmonary embolism identified. Re-formatted imaging confirms these findings. Please note evaluation for CED possible small distal emboli limited on this exam due to respiratory motion artifact. Mediastinum/Nodes: Visualized thyroid is normal. Scattered partially calcified mediastinal and hilar lymph nodes noted, compatible with prior granulomatous infection. Prevascular node measures 14 mm. No other pathologically enlarged mediastinal, hilar, or axillary lymph nodes identified. Esophagus within normal limits. Lungs/Pleura: Moderate layering right pleural effusion. Associated right lower lobe atelectasis. No other focal infiltrates. No pulmonary edema. Multiple scattered calcified granulomas noted. No worrisome pulmonary nodule or mass. No pneumothorax. Upper Abdomen: Changes compatible cirrhosis noted within the partially visualized liver. Ascites present within the visualized upper abdomen. Partially visualized upper abdomen otherwise unremarkable. Musculoskeletal: No acute osseous abnormality. No worrisome lytic or blastic osseous lesions. Review of the MIP images confirms the above findings. IMPRESSION: 1. No CT evidence for acute pulmonary embolism. 2. Moderate layering right pleural  effusion with associated atelectasis. 3. Changes compatible with cirrhosis with moderate volume ascites within the visualized upper abdomen. 4. Atherosclerosis with diffuse 3 vessel coronary artery calcifications. Electronically Signed   By: Jeannine Boga M.D.   On: 11/24/2016 20:51     Scheduled Meds: . ciprofloxacin  500 mg Oral QHS  . folic acid  1 mg Oral Daily  . furosemide  40 mg Oral Daily  . lactulose  30 g Oral BID  . LORazepam  0-4 mg Oral Q6H   Followed by  . [START ON 11/26/2016] LORazepam  0-4 mg Oral Q12H  . multivitamin  with minerals  1 tablet Oral Daily  . multivitamin with minerals  1 tablet Oral Daily  . nadolol  40 mg Oral Daily  . pantoprazole  40 mg Oral Daily  . potassium chloride  40 mEq Oral Q4H  . rifaximin  550 mg Oral BID  . sodium chloride flush  3 mL Intravenous Q12H  . sodium chloride flush  3 mL Intravenous Q12H  . spironolactone  200 mg Oral BID  . thiamine  100 mg Oral Daily   Or  . thiamine  100 mg Intravenous Daily  . thiamine  100 mg Oral Daily   Continuous Infusions:   LOS: 1 day    Time spent: 30 min    Janece Canterbury, MD [facility] Pager [phone]  If 7PM-7AM, please contact night-coverage [email or web address] Password [email or web address] 11/25/2016, 4:20 PM

## 2016-11-26 LAB — BASIC METABOLIC PANEL
ANION GAP: 7 (ref 5–15)
ANION GAP: 6 (ref 5–15)
BUN: 5 mg/dL — ABNORMAL LOW (ref 6–20)
BUN: 6 mg/dL (ref 6–20)
CALCIUM: 9 mg/dL (ref 8.9–10.3)
CALCIUM: 9.1 mg/dL (ref 8.9–10.3)
CO2: 33 mmol/L — ABNORMAL HIGH (ref 22–32)
CO2: 33 mmol/L — AB (ref 22–32)
CREATININE: 0.63 mg/dL (ref 0.61–1.24)
Chloride: 96 mmol/L — ABNORMAL LOW (ref 101–111)
Chloride: 97 mmol/L — ABNORMAL LOW (ref 101–111)
Creatinine, Ser: 0.74 mg/dL (ref 0.61–1.24)
GFR calc Af Amer: >60 mL/min (ref >60)
GLUCOSE: 181 mg/dL — AB (ref 65–99)
Glucose, Bld: 109 mg/dL — ABNORMAL HIGH (ref 65–99)
POTASSIUM: 3.2 mmol/L — AB (ref 3.5–5.1)
Potassium: 3.5 mmol/L (ref 3.5–5.1)
SODIUM: 136 mmol/L (ref 135–145)
Sodium: 136 mmol/L (ref 135–145)

## 2016-11-26 LAB — IRON AND TIBC
Iron: 177 ug/dL (ref 45–182)
Saturation Ratios: 89 % — ABNORMAL HIGH (ref 17.9–39.5)
TIBC: 199 ug/dL — AB (ref 250–450)
UIBC: 22 ug/dL

## 2016-11-26 LAB — TSH: TSH: 4.116 u[IU]/mL (ref 0.350–4.500)

## 2016-11-26 LAB — MAGNESIUM
MAGNESIUM: 1.3 mg/dL — AB (ref 1.7–2.4)
Magnesium: 2.8 mg/dL — ABNORMAL HIGH (ref 1.7–2.4)

## 2016-11-26 LAB — FERRITIN: Ferritin: 694 ng/mL — ABNORMAL HIGH (ref 24–336)

## 2016-11-26 LAB — FOLATE: Folate: 14.8 ng/mL (ref >5.9)

## 2016-11-26 LAB — VITAMIN B12: VITAMIN B 12: 1142 pg/mL — AB (ref 180–914)

## 2016-11-26 MED ORDER — MAGNESIUM OXIDE 400 MG PO CAPS: 400.0000 mg | ORAL_CAPSULE | Freq: Every day | ORAL | 0 refills | Status: DC

## 2016-11-26 MED ORDER — MAGNESIUM SULFATE 50 % IJ SOLN
6.0000 g | Freq: Once | INTRAVENOUS | Status: AC
  Administered 2016-11-26: 6 g via INTRAVENOUS
  Filled 2016-11-26: qty 10

## 2016-11-26 MED ORDER — POTASSIUM CHLORIDE CRYS ER 20 MEQ PO TBCR
40.0000 meq | EXTENDED_RELEASE_TABLET | ORAL | Status: AC
  Administered 2016-11-26 (×2): 40 meq via ORAL
  Filled 2016-11-26 (×2): qty 2

## 2016-11-26 NOTE — Discharge Instructions (Signed)
Dehydration, Adult Dehydration is a condition in which there is not enough fluid or water in the body. This happens when you lose more fluids than you take in. Important organs, such as the kidneys, brain, and heart, cannot function without a proper amount of fluids. Any loss of fluids from the body can lead to dehydration. Dehydration can range from mild to severe. This condition should be treated right away to prevent it from becoming severe. What are the causes? This condition may be caused by:  Vomiting.  Diarrhea.  Excessive sweating, such as from heat exposure or exercise.  Not drinking enough fluid, especially:  When ill.  While doing activity that requires a lot of energy.  Excessive urination.  Fever.  Infection.  Certain medicines, such as medicines that cause the body to lose excess fluid (diuretics).  Inability to access safe drinking water.  Reduced physical ability to get adequate water and food. What increases the risk? This condition is more likely to develop in people:  Who have a poorly controlled long-term (chronic) illness, such as diabetes, heart disease, or kidney disease.  Who are age 65 or older.  Who are disabled.  Who live in a place with high altitude.  Who play endurance sports. What are the signs or symptoms? Symptoms of mild dehydration may include:   Thirst.  Dry lips.  Slightly dry mouth.  Dry, warm skin.  Dizziness. Symptoms of moderate dehydration may include:   Very dry mouth.  Muscle cramps.  Dark urine. Urine may be the color of tea.  Decreased urine production.  Decreased tear production.  Heartbeat that is irregular or faster than normal (palpitations).  Headache.  Light-headedness, especially when you stand up from a sitting position.  Fainting (syncope). Symptoms of severe dehydration may include:   Changes in skin, such as:  Cold and clammy skin.  Blotchy (mottled) or pale skin.  Skin that does  not quickly return to normal after being lightly pinched and released (poor skin turgor).  Changes in body fluids, such as:  Extreme thirst.  No tear production.  Inability to sweat when body temperature is high, such as in hot weather.  Very little urine production.  Changes in vital signs, such as:  Weak pulse.  Pulse that is more than 100 beats a minute when sitting still.  Rapid breathing.  Low blood pressure.  Other changes, such as:  Sunken eyes.  Cold hands and feet.  Confusion.  Lack of energy (lethargy).  Difficulty waking up from sleep.  Dayana Dalporto-term weight loss.  Unconsciousness. How is this diagnosed? This condition is diagnosed based on your symptoms and a physical exam. Blood and urine tests may be done to help confirm the diagnosis. How is this treated? Treatment for this condition depends on the severity. Mild or moderate dehydration can often be treated at home. Treatment should be started right away. Do not wait until dehydration becomes severe. Severe dehydration is an emergency and it needs to be treated in a hospital. Treatment for mild dehydration may include:   Drinking more fluids.  Replacing salts and minerals in your blood (electrolytes) that you may have lost. Treatment for moderate dehydration may include:   Drinking an oral rehydration solution (ORS). This is a drink that helps you replace fluids and electrolytes (rehydrate). It can be found at pharmacies and retail stores. Treatment for severe dehydration may include:   Receiving fluids through an IV tube.  Receiving an electrolyte solution through a feeding tube that is   passed through your nose and into your stomach (nasogastric tube, or NG tube).  Correcting any abnormalities in electrolytes.  Treating the underlying cause of dehydration. Follow these instructions at home:  If directed by your health care provider, drink an ORS:  Make an ORS by following instructions on the  package.  Start by drinking small amounts, about  cup (120 mL) every 5-10 minutes.  Slowly increase how much you drink until you have taken the amount recommended by your health care provider.  Drink enough clear fluid to keep your urine clear or pale yellow. If you were told to drink an ORS, finish the ORS first, then start slowly drinking other clear fluids. Drink fluids such as:  Water. Do not drink only water. Doing that can lead to having too little salt (sodium) in the body (hyponatremia).  Ice chips.  Fruit juice that you have added water to (diluted fruit juice).  Low-calorie sports drinks.  Avoid:  Alcohol.  Drinks that contain a lot of sugar. These include high-calorie sports drinks, fruit juice that is not diluted, and soda.  Caffeine.  Foods that are greasy or contain a lot of fat or sugar.  Take over-the-counter and prescription medicines only as told by your health care provider.  Do not take sodium tablets. This can lead to having too much sodium in the body (hypernatremia).  Eat foods that contain a healthy balance of electrolytes, such as bananas, oranges, potatoes, tomatoes, and spinach.  Keep all follow-up visits as told by your health care provider. This is important. Contact a health care provider if:  You have abdominal pain that:  Gets worse.  Stays in one area (localizes).  You have a rash.  You have a stiff neck.  You are more irritable than usual.  You are sleepier or more difficult to wake up than usual.  You feel weak or dizzy.  You feel very thirsty.  You have urinated only a small amount of very dark urine over 6-8 hours. Get help right away if:  You have symptoms of severe dehydration.  You cannot drink fluids without vomiting.  Your symptoms get worse with treatment.  You have a fever.  You have a severe headache.  You have vomiting or diarrhea that:  Gets worse.  Does not go away.  You have blood or green matter  (bile) in your vomit.  You have blood in your stool. This may cause stool to look black and tarry.  You have not urinated in 6-8 hours.  You faint.  Your heart rate while sitting still is over 100 beats a minute.  You have trouble breathing. This information is not intended to replace advice given to you by your health care provider. Make sure you discuss any questions you have with your health care provider. Document Released: 11/27/2005 Document Revised: 06/23/2016 Document Reviewed: 01/21/2016 Elsevier Interactive Patient Education  2017 [employer]  

## 2016-11-26 NOTE — Discharge Summary (Addendum)
Physician Discharge Summary  Thomas [name] [id number] DOB: Jan 12, 1961 DOA: 11/24/2016  PCP: [name], MD  Admit date: 11/24/2016 Discharge date: 11/26/2016  Admitted From: Home  Disposition:  Home  Recommendations for Outpatient Follow-up:  1. Follow up with PCP in 1 weeks 2. Please obtain BMP and magnesium and CBC 3. Patient declined in-hospital thoracentesis, but could consider outpatient diagnostic thoracentesis per primary care doctor and serial CXR to follow effusion 4. 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 He had been seeing Hepatologist at [facility] until recently, and currently his PCP working on transfer of care to Hepatologist at [facility]. His wife brought him to ED for confusion, poor oral intake, and weakness. In the ER, He was noted to be dehydrated and his mentation improved dramatically after IVF.  They were going to discharge him home from the ER, but he became hypoxic to the low 70s on RA.  CT angio chest was negative for PE, but confirmed a moderate layering right pleural effusion and atelectasis.  He was placed on oxygen and admitted for observation.  His magnesium and potassium were also critically low and these were supplemented.  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 I offered diagnostic thoracentesis during his hospitalization, however he declined and stated that he would prefer to talk to his primary care doctor about  this.    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 -  Successfully transition to room air -  Troponins were negative -  Telemetry demonstrated normal sinus rhythm -  Recommend follow-up with primary care doctor to discuss the possibility of diagnostic thoracentesis and ongoing trend of effusion volume with serial chest x-rays  Cirrhosis -  Awaiting appointment at [facility] (previously seen at [facility]) -  Continue ciprofloxacin prophylaxis -  Temporarily holding Lasix due to severe electrolyte balance -  Continue Aldactone -  Continue lactulose at home dose  Metabolic encephalopathy secondary to dehydration, resolved with IVF.  Dehydration, likely due to diuretics, frequent stools from lactulose, and poor oral intake.  Resolved with IVF.  -  Eating well, stools at baseline at time of discharge  Severe hypokalemia and hypomagnesemia due to diuretic use - Have asked him to temporarily hold his Lasix until he can get a BMP and magnesium done by his primary care doctor -  It is like a lighter still normal at his follow-up appointment, recommend resuming his Lasix -  Continued Aldactone -  Did not start home potassium supplementation for now but did recommend oral magnesium   Macrocytic anemia, vitamin B12 level is elevated, folate and TSH were normal, and not iron deficient. Macrocytosis may be due to ongoing  alcohol consumption that the patient is not admitting to and liver disease.    Thrombocytopenia, likely secondary to cirrhosis  Discharge Instructions  Discharge Instructions    Call MD for:  difficulty breathing, headache or visual  disturbances    Complete by:  As directed    Call MD for:  extreme fatigue    Complete by:  As directed    Call MD for:  hives    Complete by:  As directed    Call MD for:  persistant dizziness or light-headedness    Complete by:  As directed    Call MD for:  persistant nausea and vomiting    Complete by:  As directed    Call MD for:  severe uncontrolled pain    Complete by:  As directed    Call MD for:  temperature >100.4    Complete by:  As directed    Diet - low sodium heart healthy    Complete by:  As directed    Increase activity slowly    Complete by:  As directed        Medication List    STOP taking these medications   furosemide 40 MG tablet Commonly known as:  LASIX   rifaximin 550 MG Tabs tablet Commonly known as:  XIFAXAN     TAKE these medications   bismuth subsalicylate 99991111 MG chewable tablet Commonly known as:  PEPTO BISMOL Chew 262-524 mg by mouth 3 (three) times daily as needed for indigestion.   calcium carbonate 750 MG chewable tablet Commonly known as:  TUMS EX Chew 1-2 tablets by mouth 3 (three) times daily as needed for heartburn.   ciprofloxacin 500 MG tablet Commonly known as:  CIPRO Take 500 mg by mouth at bedtime.   lactulose 10 GM/15ML solution Commonly known as:  CHRONULAC Take 45 mLs (30 g total) by mouth 2 (two) times daily.   Magnesium Oxide 400 MG Caps Take 1 capsule (400 mg total) by mouth daily.   nadolol 40 MG tablet Commonly known as:  CORGARD TAKE 1 TABLET (40 MG TOTAL) BY MOUTH DAILY.   pantoprazole 40 MG tablet Commonly known as:  PROTONIX TAKE 1 TABLET (40 MG TOTAL) BY MOUTH 2 (TWO) TIMES DAILY. What changed:  See the new instructions.   spironolactone 100 MG tablet Commonly known as:  ALDACTONE Take 1 tablet (100 mg total) by mouth daily. What changed:  how much to take  when to take this      Follow-up Information    [name], MD. Go in 1 week(s).   Specialty:  Family Medicine Contact  information: [address] [place] [address] [phone]          Allergies  Allergen Reactions  . Rifaximin Swelling    Consultations: None   Procedures/Studies: Dg Chest 2 View  Result Date: 11/24/2016 CLINICAL DATA:  Hypoxia EXAM: CHEST  2 VIEW COMPARISON:  10/14/2014 FINDINGS: The left lung is clear. There is a small right-sided pleural effusion with right basilar atelectasis. Stable cardiomediastinal silhouette. No pneumothorax. IMPRESSION: Small right-sided pleural effusion with basilar atelectasis. Electronically Signed   By: Donavan Foil M.D.   On: 11/24/2016 19:44   Ct Angio Chest Pe W And/or Wo Contrast  Result Date: 11/24/2016 CLINICAL DATA:  Initial evaluation for acute shortness of breath. History of cirrhosis. EXAM: CT ANGIOGRAPHY CHEST WITH CONTRAST TECHNIQUE: Multidetector CT imaging of the chest was performed using the standard protocol during bolus administration of intravenous  contrast. Multiplanar CT image reconstructions and MIPs were obtained to evaluate the vascular anatomy. CONTRAST:  100 cc of Isovue 370. COMPARISON:  Comparison with prior radiograph from earlier the same day. FINDINGS: Cardiovascular: Intrathoracic aorta of normal caliber without acute abnormality. Scattered atheromatous plaque within the arch and about the origin of the great vessels without high-grade stenosis. Visualized great vessels themselves well opacified and within normal limits. Mild cardiomegaly. Diffuse 3 vessel coronary artery calcifications. No pericardial effusion. Pulmonary arterial tree adequately opacified for evaluation. Main pulmonary artery within normal limits for size measuring 2.3 cm in diameter. No filling defect to suggest acute pulmonary embolism identified. Re-formatted imaging confirms these findings. Please note evaluation for CED possible small distal emboli limited on this exam due to respiratory motion artifact. Mediastinum/Nodes: Visualized  thyroid is normal. Scattered partially calcified mediastinal and hilar lymph nodes noted, compatible with prior granulomatous infection. Prevascular node measures 14 mm. No other pathologically enlarged mediastinal, hilar, or axillary lymph nodes identified. Esophagus within normal limits. Lungs/Pleura: Moderate layering right pleural effusion. Associated right lower lobe atelectasis. No other focal infiltrates. No pulmonary edema. Multiple scattered calcified granulomas noted. No worrisome pulmonary nodule or mass. No pneumothorax. Upper Abdomen: Changes compatible cirrhosis noted within the partially visualized liver. Ascites present within the visualized upper abdomen. Partially visualized upper abdomen otherwise unremarkable. Musculoskeletal: No acute osseous abnormality. No worrisome lytic or blastic osseous lesions. Review of the MIP images confirms the above findings. IMPRESSION: 1. No CT evidence for acute pulmonary embolism. 2. Moderate layering right pleural effusion with associated atelectasis. 3. Changes compatible with cirrhosis with moderate volume ascites within the visualized upper abdomen. 4. Atherosclerosis with diffuse 3 vessel coronary artery calcifications. Electronically Signed   By: Jeannine Boga M.D.   On: 11/24/2016 20:51    Subjective: Feeling much better today. States that his mentation, energy, and breathing are back to baseline. He wants to go home today.  Discharge Exam: Vitals:   11/25/16 2028 11/26/16 0542  BP: (!) 157/76 (!) 160/76  Pulse: 75 69  Resp: 18 18  Temp: 98.7 F (37.1 C) 98.4 F (36.9 C)   Vitals:   11/25/16 1500 11/25/16 1700 11/25/16 2028 11/26/16 0542  BP: 130/62  (!) 157/76 (!) 160/76  Pulse: 82  75 69  Resp: 18  18 18   Temp: 98.2 F (36.8 C)  98.7 F (37.1 C) 98.4 F (36.9 C)  TempSrc: Oral  Oral Oral  SpO2: 95% 93% 98% 98%  Weight:      Height:        General exam:  Adult Male.  No acute distress.  HEENT:  NCAT, MMM Respiratory  system: Diminished to the mid back on the right side, otherwise clear to auscultation bilaterally, no rales, wheezes, or rhonchi Cardiovascular system: Regular rate and rhythm, normal S1/S2. No murmurs, rubs, gallops or clicks.  Warm extremities Gastrointestinal system: Normal active bowel sounds, soft, mildly distended, nontender. MSK:  Normal tone and bulk, no lower extremity edema Neuro:  Grossly intact Psych:  Alert and oriented 3, answers questions quickly and appropriately, no confusion evident on exam    The results of significant diagnostics from this hospitalization (including imaging, microbiology, ancillary and laboratory) are listed below for reference.     Microbiology: No results found for this or any previous visit (from the past 240 hour(s)).   Labs: BNP (last 3 results) No results for input(s): BNP in the last 8760 hours. Basic Metabolic Panel:  Recent Labs Lab 11/24/16 1711 11/25/16 0007 11/25/16  [id number] 11/26/16 0549 11/26/16 1258  NA 141  --  142 136 136  K 2.6*  --  2.7* 3.2* 3.5  CL 95*  --  99* 96* 97*  CO2 35*  --  33* 33* 33*  GLUCOSE 113*  --  150* 109* 181*  BUN 11  --  8 5* 6  CREATININE 0.93  --  0.69 0.74 0.63  CALCIUM 9.7  --  9.2 9.0 9.1  MG  --  1.0*  --  1.3* 2.8*  PHOS  --  2.6  --   --   --    Liver Function Tests:  Recent Labs Lab 11/24/16 1711  AST 93*  ALT 36  ALKPHOS 141*  BILITOT 4.4*  PROT 7.2  ALBUMIN 2.7*   No results for input(s): LIPASE, AMYLASE in the last 168 hours.  Recent Labs Lab 11/24/16 1711  AMMONIA 40*   CBC:  Recent Labs Lab 11/24/16 1711 11/25/16 0520  WBC 8.1 5.7  HGB 10.5* 9.5*  HCT 30.5* 27.2*  MCV 108.2* 108.8*  PLT 139* 101*   Cardiac Enzymes:  Recent Labs Lab 11/25/16 0007 11/25/16 0520 11/25/16 1116  TROPONINI <0.03 <0.03 <0.03   BNP: Invalid input(s): POCBNP CBG:  Recent Labs Lab 11/24/16 1655  GLUCAP 113*   D-Dimer No results for input(s): DDIMER in the last 72  hours. Hgb A1c No results for input(s): HGBA1C in the last 72 hours. Lipid Profile No results for input(s): CHOL, HDL, LDLCALC, TRIG, CHOLHDL, LDLDIRECT in the last 72 hours. Thyroid function studies  Recent Labs  11/26/16 0549  TSH 4.116   Anemia work up  Recent Labs  11/26/16 0549  VITAMINB12 1,142*  FOLATE 14.8  FERRITIN 694*  TIBC 199*  IRON 177   Urinalysis    Component Value Date/Time   COLORURINE YELLOW 11/24/2016 1616   APPEARANCEUR CLEAR 11/24/2016 1616   LABSPEC 1.009 11/24/2016 1616   PHURINE 7.0 11/24/2016 1616   GLUCOSEU NEGATIVE 11/24/2016 1616   HGBUR NEGATIVE 11/24/2016 1616   BILIRUBINUR NEGATIVE 11/24/2016 1616   KETONESUR NEGATIVE 11/24/2016 1616   PROTEINUR NEGATIVE 11/24/2016 1616   UROBILINOGEN 0.2 11/22/2012 0856   NITRITE NEGATIVE 11/24/2016 1616   LEUKOCYTESUR NEGATIVE 11/24/2016 1616   Sepsis Labs Invalid input(s): PROCALCITONIN,  WBC,  LACTICIDVEN   Time coordinating discharge: Over 30 minutes  SIGNED:   Janece Canterbury, MD  [facility] 11/26/2016, 2:18 PM Pager   If 7PM-7AM, please contact night-coverage [email or web address] Password [email or web address]

## 2016-12-21 ENCOUNTER — Other Ambulatory Visit: Payer: Self-pay | Admitting: Family Medicine

## 2016-12-21 ENCOUNTER — Ambulatory Visit
Admission: RE | Admit: 2016-12-21 | Discharge: 2016-12-21 | Disposition: A | Discharge status: Home / Self Care | Payer: BLUE CROSS/BLUE SHIELD | Source: Ambulatory Visit | Attending: Family Medicine | Admitting: Family Medicine

## 2016-12-21 DIAGNOSIS — D539 Nutritional anemia, unspecified: Secondary | ICD-10-CM | POA: Diagnosis not present

## 2016-12-21 DIAGNOSIS — K3189 Other diseases of stomach and duodenum: Secondary | ICD-10-CM | POA: Diagnosis not present

## 2016-12-21 DIAGNOSIS — J90 Pleural effusion, not elsewhere classified: Secondary | ICD-10-CM | POA: Diagnosis not present

## 2016-12-21 DIAGNOSIS — E876 Hypokalemia: Secondary | ICD-10-CM | POA: Diagnosis not present

## 2017-01-04 DIAGNOSIS — Z23 Encounter for immunization: Secondary | ICD-10-CM | POA: Diagnosis not present

## 2017-03-03 ENCOUNTER — Inpatient Hospital Stay (HOSPITAL_COMMUNITY)
Admission: EM | Admit: 2017-03-03 | Discharge: 2017-03-06 | DRG: 433 | Disposition: A | Discharge status: Home / Self Care | Payer: BLUE CROSS/BLUE SHIELD | Attending: Internal Medicine | Admitting: Internal Medicine

## 2017-03-03 ENCOUNTER — Encounter (HOSPITAL_COMMUNITY): Payer: Self-pay | Admitting: Emergency Medicine

## 2017-03-03 ENCOUNTER — Emergency Department (HOSPITAL_COMMUNITY): Payer: BLUE CROSS/BLUE SHIELD

## 2017-03-03 DIAGNOSIS — N179 Acute kidney failure, unspecified: Secondary | ICD-10-CM | POA: Diagnosis present

## 2017-03-03 DIAGNOSIS — K7030 Alcoholic cirrhosis of liver without ascites: Secondary | ICD-10-CM | POA: Diagnosis present

## 2017-03-03 DIAGNOSIS — R0600 Dyspnea, unspecified: Secondary | ICD-10-CM | POA: Diagnosis present

## 2017-03-03 DIAGNOSIS — D649 Anemia, unspecified: Secondary | ICD-10-CM

## 2017-03-03 DIAGNOSIS — J918 Pleural effusion in other conditions classified elsewhere: Secondary | ICD-10-CM | POA: Diagnosis present

## 2017-03-03 DIAGNOSIS — K7031 Alcoholic cirrhosis of liver with ascites: Principal | ICD-10-CM | POA: Diagnosis present

## 2017-03-03 DIAGNOSIS — Z79899 Other long term (current) drug therapy: Secondary | ICD-10-CM

## 2017-03-03 DIAGNOSIS — I8500 Esophageal varices without bleeding: Secondary | ICD-10-CM | POA: Diagnosis present

## 2017-03-03 DIAGNOSIS — D62 Acute posthemorrhagic anemia: Secondary | ICD-10-CM | POA: Diagnosis present

## 2017-03-03 DIAGNOSIS — D696 Thrombocytopenia, unspecified: Secondary | ICD-10-CM | POA: Diagnosis present

## 2017-03-03 DIAGNOSIS — Z83511 Family history of glaucoma: Secondary | ICD-10-CM

## 2017-03-03 DIAGNOSIS — D500 Iron deficiency anemia secondary to blood loss (chronic): Secondary | ICD-10-CM | POA: Diagnosis present

## 2017-03-03 DIAGNOSIS — Z9889 Other specified postprocedural states: Secondary | ICD-10-CM

## 2017-03-03 DIAGNOSIS — D6959 Other secondary thrombocytopenia: Secondary | ICD-10-CM | POA: Diagnosis present

## 2017-03-03 DIAGNOSIS — I10 Essential (primary) hypertension: Secondary | ICD-10-CM | POA: Diagnosis present

## 2017-03-03 DIAGNOSIS — K769 Liver disease, unspecified: Secondary | ICD-10-CM

## 2017-03-03 DIAGNOSIS — I959 Hypotension, unspecified: Secondary | ICD-10-CM | POA: Diagnosis present

## 2017-03-03 DIAGNOSIS — D689 Coagulation defect, unspecified: Secondary | ICD-10-CM | POA: Diagnosis present

## 2017-03-03 DIAGNOSIS — K219 Gastro-esophageal reflux disease without esophagitis: Secondary | ICD-10-CM | POA: Diagnosis present

## 2017-03-03 DIAGNOSIS — Z833 Family history of diabetes mellitus: Secondary | ICD-10-CM

## 2017-03-03 DIAGNOSIS — K648 Other hemorrhoids: Secondary | ICD-10-CM | POA: Diagnosis present

## 2017-03-03 DIAGNOSIS — E876 Hypokalemia: Secondary | ICD-10-CM | POA: Diagnosis present

## 2017-03-03 DIAGNOSIS — Z792 Long term (current) use of antibiotics: Secondary | ICD-10-CM

## 2017-03-03 DIAGNOSIS — K5730 Diverticulosis of large intestine without perforation or abscess without bleeding: Secondary | ICD-10-CM | POA: Diagnosis present

## 2017-03-03 DIAGNOSIS — E785 Hyperlipidemia, unspecified: Secondary | ICD-10-CM | POA: Diagnosis present

## 2017-03-03 DIAGNOSIS — R1900 Intra-abdominal and pelvic swelling, mass and lump, unspecified site: Secondary | ICD-10-CM | POA: Diagnosis present

## 2017-03-03 DIAGNOSIS — K7210 Chronic hepatic failure without coma: Secondary | ICD-10-CM | POA: Diagnosis present

## 2017-03-03 DIAGNOSIS — J948 Other specified pleural conditions: Secondary | ICD-10-CM | POA: Diagnosis present

## 2017-03-03 DIAGNOSIS — D684 Acquired coagulation factor deficiency: Secondary | ICD-10-CM | POA: Diagnosis present

## 2017-03-03 DIAGNOSIS — Z820 Family history of epilepsy and other diseases of the nervous system: Secondary | ICD-10-CM

## 2017-03-03 DIAGNOSIS — J90 Pleural effusion, not elsewhere classified: Secondary | ICD-10-CM

## 2017-03-03 DIAGNOSIS — Z881 Allergy status to other antibiotic agents status: Secondary | ICD-10-CM

## 2017-03-03 DIAGNOSIS — K3189 Other diseases of stomach and duodenum: Secondary | ICD-10-CM | POA: Diagnosis present

## 2017-03-03 DIAGNOSIS — K7290 Hepatic failure, unspecified without coma: Secondary | ICD-10-CM | POA: Diagnosis present

## 2017-03-03 DIAGNOSIS — R7989 Other specified abnormal findings of blood chemistry: Secondary | ICD-10-CM | POA: Diagnosis present

## 2017-03-03 DIAGNOSIS — D539 Nutritional anemia, unspecified: Secondary | ICD-10-CM | POA: Diagnosis present

## 2017-03-03 DIAGNOSIS — K766 Portal hypertension: Secondary | ICD-10-CM | POA: Diagnosis present

## 2017-03-03 DIAGNOSIS — R0602 Shortness of breath: Secondary | ICD-10-CM | POA: Diagnosis not present

## 2017-03-03 LAB — COMPREHENSIVE METABOLIC PANEL
ALT: 33 U/L (ref 17–63)
AST: 91 U/L — ABNORMAL HIGH (ref 15–41)
Albumin: 2.1 g/dL — ABNORMAL LOW (ref 3.5–5.0)
Alkaline Phosphatase: 118 U/L (ref 38–126)
Anion gap: 10 (ref 5–15)
BUN: 18 mg/dL (ref 6–20)
CO2: 32 mmol/L (ref 22–32)
Calcium: 8.9 mg/dL (ref 8.9–10.3)
Chloride: 98 mmol/L — ABNORMAL LOW (ref 101–111)
Creatinine, Ser: 1.28 mg/dL — ABNORMAL HIGH (ref 0.61–1.24)
GFR calc Af Amer: >60 mL/min (ref >60)
GFR calc non Af Amer: >60 mL/min (ref >60)
Glucose, Bld: 171 mg/dL — ABNORMAL HIGH (ref 65–99)
Potassium: 2.9 mmol/L — ABNORMAL LOW (ref 3.5–5.1)
Sodium: 140 mmol/L (ref 135–145)
Total Bilirubin: 9 mg/dL — ABNORMAL HIGH (ref 0.3–1.2)
Total Protein: 6.2 g/dL — ABNORMAL LOW (ref 6.5–8.1)

## 2017-03-03 LAB — CBC WITH DIFFERENTIAL/PLATELET
Basophils Absolute: 0 10*3/uL (ref 0.0–0.1)
Basophils Relative: 0 %
Eosinophils Absolute: 0.2 10*3/uL (ref 0.0–0.7)
Eosinophils Relative: 3 %
HCT: 20.7 % — ABNORMAL LOW (ref 39.0–52.0)
Hemoglobin: 7 g/dL — ABNORMAL LOW (ref 13.0–17.0)
Lymphocytes Relative: 14 %
Lymphs Abs: 1.1 10*3/uL (ref 0.7–4.0)
MCH: 42.2 pg — ABNORMAL HIGH (ref 26.0–34.0)
MCHC: 33.8 g/dL (ref 30.0–36.0)
MCV: 124.7 fL — ABNORMAL HIGH (ref 78.0–100.0)
Monocytes Absolute: 1.1 10*3/uL — ABNORMAL HIGH (ref 0.1–1.0)
Monocytes Relative: 15 %
Neutro Abs: 5.2 10*3/uL (ref 1.7–7.7)
Neutrophils Relative %: 68 %
Platelets: 47 10*3/uL — ABNORMAL LOW (ref 150–400)
RBC: 1.66 MIL/uL — ABNORMAL LOW (ref 4.22–5.81)
RDW: 18.7 % — ABNORMAL HIGH (ref 11.5–15.5)
WBC: 7.6 10*3/uL (ref 4.0–10.5)

## 2017-03-03 LAB — URINALYSIS, ROUTINE W REFLEX MICROSCOPIC
BILIRUBIN URINE: NEGATIVE
Glucose, UA: NEGATIVE mg/dL
Hgb urine dipstick: NEGATIVE
Ketones, ur: NEGATIVE mg/dL
LEUKOCYTES UA: NEGATIVE
NITRITE: NEGATIVE
PH: 5 (ref 5.0–8.0)
Protein, ur: NEGATIVE mg/dL
SPECIFIC GRAVITY, URINE: 1.014 (ref 1.005–1.030)

## 2017-03-03 LAB — MAGNESIUM: Magnesium: 0.9 mg/dL — CL (ref 1.7–2.4)

## 2017-03-03 LAB — PROTIME-INR
INR: 2.94
Prothrombin Time: 31.3 seconds — ABNORMAL HIGH (ref 11.4–15.2)

## 2017-03-03 LAB — TROPONIN I: Troponin I: <0.03 ng/mL (ref <0.03)

## 2017-03-03 LAB — OCCULT BLOOD X 1 CARD TO LAB, STOOL: FECAL OCCULT BLD: POSITIVE — AB

## 2017-03-03 LAB — PREPARE RBC (CROSSMATCH)

## 2017-03-03 MED ORDER — NADOLOL 40 MG PO TABS
40.0000 mg | ORAL_TABLET | Freq: Every day | ORAL | Status: DC
  Administered 2017-03-04 – 2017-03-06 (×3): 40 mg via ORAL
  Filled 2017-03-03 (×3): qty 1

## 2017-03-03 MED ORDER — ONDANSETRON HCL 4 MG/2ML IJ SOLN
4.0000 mg | Freq: Once | INTRAMUSCULAR | Status: AC
  Administered 2017-03-03: 4 mg via INTRAVENOUS
  Filled 2017-03-03: qty 2

## 2017-03-03 MED ORDER — CIPROFLOXACIN HCL 500 MG PO TABS
500.0000 mg | ORAL_TABLET | Freq: Every day | ORAL | Status: DC
  Administered 2017-03-03 – 2017-03-05 (×3): 500 mg via ORAL
  Filled 2017-03-03 (×3): qty 1

## 2017-03-03 MED ORDER — SODIUM CHLORIDE 0.9 % IV SOLN: Freq: Once | INTRAVENOUS | Status: DC

## 2017-03-03 MED ORDER — MAGNESIUM SULFATE 2 GM/50ML IV SOLN
2.0000 g | Freq: Once | INTRAVENOUS | Status: AC
Start: 2017-03-03 — End: 2017-03-03
  Administered 2017-03-03: 2 g via INTRAVENOUS
  Filled 2017-03-03: qty 50

## 2017-03-03 MED ORDER — LACTULOSE 10 GM/15ML PO SOLN
30.0000 g | Freq: Two times a day (BID) | ORAL | Status: DC
  Administered 2017-03-03 – 2017-03-06 (×6): 30 g via ORAL
  Filled 2017-03-03 (×6): qty 45

## 2017-03-03 MED ORDER — POTASSIUM CHLORIDE CRYS ER 20 MEQ PO TBCR
60.0000 meq | EXTENDED_RELEASE_TABLET | Freq: Once | ORAL | Status: AC
  Administered 2017-03-03: 60 meq via ORAL
  Filled 2017-03-03: qty 3

## 2017-03-03 MED ORDER — FUROSEMIDE 20 MG PO TABS
40.0000 mg | ORAL_TABLET | Freq: Two times a day (BID) | ORAL | Status: DC
  Administered 2017-03-04 – 2017-03-05 (×3): 40 mg via ORAL
  Filled 2017-03-03 (×3): qty 2

## 2017-03-03 MED ORDER — PANTOPRAZOLE SODIUM 40 MG IV SOLR
40.0000 mg | Freq: Two times a day (BID) | INTRAVENOUS | Status: DC
  Administered 2017-03-03 – 2017-03-05 (×5): 40 mg via INTRAVENOUS
  Filled 2017-03-03 (×5): qty 40

## 2017-03-03 MED ORDER — SPIRONOLACTONE 25 MG PO TABS
100.0000 mg | ORAL_TABLET | Freq: Every day | ORAL | Status: DC
Start: 2017-03-04 — End: 2017-03-05
  Administered 2017-03-04 – 2017-03-05 (×2): 100 mg via ORAL
  Filled 2017-03-03 (×2): qty 4

## 2017-03-03 NOTE — Progress Notes (Signed)
Report to Maudie Mercury, RN, to assume care of patient at transfer to 1331. Pt transferred by wheelchair with NT and RN in stable condition. All belongings sent with patient. Wife with patient during transfer. Pt in no distress, with stable vitals at time of transfer.

## 2017-03-03 NOTE — Progress Notes (Signed)
Patient to OBS unit by ED RN and he and his wife are expressing concern that there is no bathroom in his room. Informed patient that he was placed in room 1101 which is directly across from the patient bathroom, and that staff clean the bathroom between each patient use.  Patient reassigned to 1109 with bathroom in room - patient and wife continue to express dissatisfaction with room, wife states "this is too small, I am claustrophobic, there is no room, this is like a jail." Wife requesting recliner, no recliners on unit, spoke to [facility] to make her aware of events. AC states we will transfer patient to a room here at [facility], to provide ultimate patient satisfaction.   Patient assisted to bed, yellow, non slip socks placed on patient. Call bell placed within reach. Bed in lowest position. SR up x2. No distress. VSS. Blood transfusion continues, patient tolerating well without signs of adverse reaction.

## 2017-03-03 NOTE — ED Provider Notes (Signed)
[facility] DEPT Provider Note   CSN: [id number] Arrival date & time: 03/03/17  4034  By signing my name below, I, Evelene Croon, attest that this documentation has been prepared under the direction and in the presence of Virgel Manifold, MD . Electronically Signed: Evelene Croon, Scribe. 03/03/2017. 10:02 AM.  History   Chief Complaint Chief Complaint  Patient presents with  . Bloated    The history is provided by the patient. No language interpreter was used.    HPI Comments:  Thomas [name] is a 56 y.o. male with a history of  portal hypertensive gastropathy and alcoholic liver cirrhosis with ascites, who presents to the Emergency Department complaining of abdominal distension x 1 week that has gradually worsened since onset. He states he feels "stuffed" after eating very little and reports mild diffuse abdominal pain after eating as well. He reports associated SOB and orthopnea since last night. Pt notes h/o similar symptoms requiring paracentesis which provided significant relief; last paracentesis was 03/06/2016. Pt denies CP. No alleviating factors noted.   Past Medical History:  Diagnosis Date  . Alcohol abuse 2013  . Alcoholic cirrhosis ([place]) 06/4258  . Anemia 02/2016   in setting of GI blood loss, but MCV macrocytic.   . Coagulopathy ([place]) 05/2015  . Diverticulosis 09/2015  . Esophageal varices ([place]) 09/2015   small  . GERD (gastroesophageal reflux disease)   . Hyperlipidemia   . Hypertension   . Portal hypertension ([place]) 09/2015   with portal gastropathy  . Thrombocytopenia ([place]) 02/2016  . Tubular adenoma of colon 09/2015    Patient Active Problem List   Diagnosis Date Noted  . Hypomagnesemia 11/26/2016  . Acute respiratory failure with hypoxia ([place]) 11/24/2016  . Pleural effusion associated with hepatic disorder 11/24/2016  . Hypokalemia 11/24/2016  . Alcoholic cirrhosis of liver with ascites ([place])   . Bleeding gastrointestinal   . End stage liver disease ([place])     . Upper GI bleed 03/05/2016  . Coagulopathy ([place]) 03/05/2016  . Alcoholic cirrhosis ([place]) 56/38/7564  . Acute kidney injury ([place]) 03/05/2016  . Hepatic encephalopathy ([place]) 03/05/2016  . Macrocytic anemia 03/05/2016  . Thrombocytopenia ([place]) 03/05/2016  . Esophageal varices ([place]) 03/05/2016  . Acute blood loss anemia 03/05/2016  . Idiopathic esophageal varices without bleeding ([place])   . Portal hypertensive gastropathy   . Encephalopathy, metabolic 33/29/5188    Past Surgical History:  Procedure Laterality Date  . ESOPHAGOGASTRODUODENOSCOPY N/A 03/05/2016   Procedure: ESOPHAGOGASTRODUODENOSCOPY (EGD);  Surgeon: Ladene Artist, MD;  Location: [name];  Service: Endoscopy;  Laterality: N/A;  . testicle prosthesis         Home Medications    Prior to Admission medications   Medication Sig Start Date End Date Taking? Authorizing Provider  bismuth subsalicylate (PEPTO BISMOL) 262 MG chewable tablet Chew 262-524 mg by mouth 3 (three) times daily as needed for indigestion.     Historical Provider, MD  calcium carbonate (TUMS EX) 750 MG chewable tablet Chew 1-2 tablets by mouth 3 (three) times daily as needed for heartburn.     Historical Provider, MD  ciprofloxacin (CIPRO) 500 MG tablet Take 500 mg by mouth at bedtime. 11/07/16   Historical Provider, MD  lactulose (CHRONULAC) 10 GM/15ML solution Take 45 mLs (30 g total) by mouth 2 (two) times daily. 03/07/16   Maryann Mikhail, DO  Magnesium Oxide 400 MG CAPS Take 1 capsule (400 mg total) by mouth daily. 11/26/16   Janece Canterbury, MD  nadolol (CORGARD) 40 MG tablet  TAKE 1 TABLET (40 MG TOTAL) BY MOUTH DAILY. 04/03/16   [name], MD  pantoprazole (PROTONIX) 40 MG tablet TAKE 1 TABLET (40 MG TOTAL) BY MOUTH 2 (TWO) TIMES DAILY. Patient taking differently: TAKE 1 TABLET (40 MG TOTAL) BY MOUTH once daily 03/10/16   [name], MD  spironolactone (ALDACTONE) 100 MG tablet Take 1 tablet (100 mg total) by mouth daily. Patient taking  differently: Take 200 mg by mouth at bedtime.  03/07/16   Cristal Ford, DO    Family History Family History  Problem Relation Age of Onset  . Diabetes Father   . Parkinson's disease Father   . Diabetes Paternal Aunt   . Glaucoma Mother     Social History Social History  Substance Use Topics  . Smoking status: Never Smoker  . Smokeless tobacco: Never Used  . Alcohol use No     Comment: Quit several months ago     Allergies   Rifaximin   Review of Systems Review of Systems  Constitutional: Negative for fever.  Respiratory: Positive for shortness of breath.   Cardiovascular: Negative for chest pain.  Gastrointestinal: Positive for abdominal distention, abdominal pain and nausea.  All other systems reviewed and are negative.    Physical Exam Updated Vital Signs BP (!) 127/54   Pulse 79   Temp 98.2 F (36.8 C) (Oral)   Resp 16   SpO2 98%   Physical Exam  Constitutional: He is oriented to person, place, and time. He appears well-developed and well-nourished. No distress.  HENT:  Head: Normocephalic and atraumatic.  Eyes: EOM are normal. Scleral icterus is present.  Cardiovascular: Normal rate and regular rhythm.   Pulmonary/Chest: Breath sounds normal. Tachypnea (mild) noted. No respiratory distress.  Abdominal: Soft. He exhibits distension. There is no tenderness.  Distended. Soft. NT.  Bedside US with small-to-moderate amount of free fluid   Neurological: He is alert and oriented to person, place, and time.  Resting tremor  Skin: Skin is warm and dry.  Psychiatric: He has a normal mood and affect.  Nursing note and vitals reviewed.    ED Treatments / Results  DIAGNOSTIC STUDIES:  Oxygen Saturation is 98% on RA, normal by my interpretation.    COORDINATION OF CARE:  10:00 AM Discussed treatment plan with pt at bedside and pt agreed to plan.  Labs (all labs ordered are listed, but only abnormal results are displayed) Labs Reviewed  CBC WITH  DIFFERENTIAL/PLATELET - Abnormal; Notable for the following:       Result Value   RBC 1.66 (*)    Hemoglobin 7.0 (*)    HCT 20.7 (*)    MCV 124.7 (*)    MCH 42.2 (*)    RDW 18.7 (*)    Platelets 47 (*)    Monocytes Absolute 1.1 (*)    All other components within normal limits  COMPREHENSIVE METABOLIC PANEL - Abnormal; Notable for the following:    Potassium 2.9 (*)    Chloride 98 (*)    Glucose, Bld 171 (*)    Creatinine, Ser 1.28 (*)    Total Protein 6.2 (*)    Albumin 2.1 (*)    AST 91 (*)    Total Bilirubin 9.0 (*)    All other components within normal limits  PROTIME-INR - Abnormal; Notable for the following:    Prothrombin Time 31.3 (*)    All other components within normal limits  MAGNESIUM - Abnormal; Notable for the following:    Magnesium  0.9 (*)    All other components within normal limits  TROPONIN I  URINALYSIS, ROUTINE W REFLEX MICROSCOPIC  OCCULT BLOOD X 1 CARD TO LAB, STOOL  TYPE AND SCREEN  PREPARE RBC (CROSSMATCH)    EKG  EKG Interpretation None       Radiology Dg Chest 2 View  Result Date: 03/03/2017 CLINICAL DATA:  Patient with abdominal swelling and ascites for 1 week. EXAM: CHEST  2 VIEW COMPARISON:  Chest radiograph 12/21/2016 FINDINGS: Monitoring leads overlie the patient. Stable enlarged cardiac and mediastinal contours. Elevation of the right hemidiaphragm. Moderate right pleural effusion with underlying consolidation. Thoracic spine degenerative changes. IMPRESSION: Interval increase in size of moderate right pleural effusion with underlying consolidation. Electronically Signed   By: Lovey Newcomer M.D.   On: 03/03/2017 10:58    Procedures Procedures (including critical care time)  CRITICAL CARE  Performed by: Virgel Manifold   Total critical care time: 35 minutes  Critical care time was exclusive of separately billable procedures and treating other patients. Critical care was necessary to treat or prevent imminent or life-threatening  deterioration. Critical care was time spent personally by me on the following activities: development of treatment plan with patient and/or surrogate as well as nursing, discussions with consultants, evaluation of patient's response to treatment, examination of patient, obtaining history from patient or surrogate, ordering and performing treatments and interventions, ordering and review of laboratory studies, ordering and review of radiographic studies, pulse oximetry and re-evaluation of patient's condition.   Medications Ordered in ED Medications - No data to display   Initial Impression / Assessment and Plan / ED Course  I have reviewed the triage vital signs and the nursing notes.  Pertinent labs & imaging results that were available during my care of the patient were reviewed by me and considered in my medical decision making (see chart for details).     56 year old male with dyspnea and increasing abdominal bloating. Known history of cirrhosis. He does have some fluid on my bedside ultrasound, but not to the degree where I feel comfortable doing a paracentesis myself nor do I think it's the primary etiology of his symptoms.   Chest x-ray significant for a right-sided effusion which has increased since prior imaging. He is also increasingly anemic. Autoanticoagulated secondary to liver dysfunction. His INR is approximately 3. Increasing thrombocytopenia with platelets of 47,000.   Non-bleeding grade I varices were found in the lower third of the esophagus on endoscopy alomst exactly a year ago.  Denies BRBPR. He has had some black-colored stool but says he's been taking Pepto-Bismol for his nausea.   Final Clinical Impressions(s) / ED Diagnoses   Final diagnoses:  Dyspnea, unspecified type  Pleural effusion  Symptomatic anemia  Alcoholic cirrhosis of liver with ascites (HCC)  Hypokalemia  Hypomagnesemia    New Prescriptions New Prescriptions   No medications on file   I  personally preformed the services scribed in my presence. The recorded information has been reviewed is accurate. Virgel Manifold, MD.     Virgel Manifold, MD 03/03/17 1226

## 2017-03-03 NOTE — H&P (Signed)
History and Physical    Thomas [name] NWG:[id number] DOB: 1961/07/20 DOA: 03/03/2017  I have briefly reviewed the patient's prior medical records in [place]  PCP: [name], MD  Patient coming from: Home  Chief Complaint: [name]  HPI: Thomas [name] is a 56 y.o. male with medical history significant of alcoholic cirrhosis, of alcohol for the past year and currently enrolled in the transplant center at [facility] (he wants to transfer care to [facility] though), prior history of GI bleed, prior history of hepatic encephalopathy, prior history of spontaneous bacterial peritonitis, coagulopathy, as well as ascites and hepatic hydrothorax requiring paracentesis and thoracentesis in June of last year, presents to the emergency room with a chief complaint of abdominal swelling, poor appetite, acute on chronic nausea as well as shortness of breath with minimal activities.  Patient tells me that over the last week he has been progressively weak, short of breath, and barely able to do anything.  He is also complaining of abdominal distention, as well as early satiety and abdominal discomfort after he eats anything, and thus he has been having little to no p.o. intake over the last several days.  He denies any fevers however has had intermittent chills at home.  He denies any chest pain.  He denies any vomiting.  He has not seen any blood in his stool however has noticed dark stools, however he takes a lot of Pepto-Bismol and thinks it is related to that.  He reports compliance with his home medications.  ED Course: In the emergency room his vital signs are stable, he is afebrile, blood work shows mild creatinine elevation to 1.3, hypokalemia with a potassium of 2.9, hemoglobin of 7.0 and platelets of 47.  Chest x-ray shows moderate right-sided pleural effusion.  Discussed with EDP, he did a bedside ultrasound of his abdomen and showed moderate ascites not enough for him to safely perform a paracentesis  at bedside.  TRH is asked for admission for anemia and dyspnea.  Review of Systems: As per HPI otherwise 10 point review of systems negative.   Past Medical History:  Diagnosis Date  . Alcohol abuse 2013  . Alcoholic cirrhosis ([place]) 04/7845  . Anemia 02/2016   in setting of GI blood loss, but MCV macrocytic.   . Coagulopathy ([place]) 05/2015  . Diverticulosis 09/2015  . Esophageal varices ([place]) 09/2015   small  . GERD (gastroesophageal reflux disease)   . Hyperlipidemia   . Hypertension   . Portal hypertension ([place]) 09/2015   with portal gastropathy  . Thrombocytopenia ([place]) 02/2016  . Tubular adenoma of colon 09/2015    Past Surgical History:  Procedure Laterality Date  . ESOPHAGOGASTRODUODENOSCOPY N/A 03/05/2016   Procedure: ESOPHAGOGASTRODUODENOSCOPY (EGD);  Surgeon: Ladene Artist, MD;  Location: [name];  Service: Endoscopy;  Laterality: N/A;  . testicle prosthesis       reports that he has never smoked. He has never used smokeless tobacco. He reports that he does not drink alcohol or use drugs.  Allergies  Allergen Reactions  . Rifaximin Swelling    Family History  Problem Relation Age of Onset  . Diabetes Father   . Parkinson's disease Father   . Diabetes Paternal Aunt   . Glaucoma Mother     Prior to Admission medications   Medication Sig Start Date End Date Taking? Authorizing Provider  bismuth subsalicylate (PEPTO BISMOL) 262 MG chewable tablet Chew 262-524 mg by mouth 3 (three) times daily as needed for indigestion.  Yes Historical Provider, MD  calcium carbonate (TUMS EX) 750 MG chewable tablet Chew 1-2 tablets by mouth 3 (three) times daily as needed for heartburn.    Yes Historical Provider, MD  ciprofloxacin (CIPRO) 500 MG tablet Take 500 mg by mouth at bedtime. 11/07/16  Yes Historical Provider, MD  furosemide (LASIX) 40 MG tablet Take 40 mg by mouth 2 (two) times daily. 02/08/17  Yes Historical Provider, MD  lactulose (CHRONULAC) 10 GM/15ML solution  Take 45 mLs (30 g total) by mouth 2 (two) times daily. 03/07/16  Yes Maryann Mikhail, DO  nadolol (CORGARD) 40 MG tablet TAKE 1 TABLET (40 MG TOTAL) BY MOUTH DAILY. 04/03/16  Yes [name], MD  pantoprazole (PROTONIX) 40 MG tablet TAKE 1 TABLET (40 MG TOTAL) BY MOUTH 2 (TWO) TIMES DAILY. Patient taking differently: TAKE 1 TABLET (40 MG TOTAL) BY MOUTH once daily 03/10/16  Yes [name], MD  spironolactone (ALDACTONE) 100 MG tablet Take 1 tablet (100 mg total) by mouth daily. Patient taking differently: Take 200 mg by mouth daily.  03/07/16  Yes Cristal Ford, DO    Physical Exam: Vitals:   03/03/17 0942  BP: (!) 127/54  Pulse: 79  Resp: 16  Temp: 98.2 F (36.8 C)  TempSrc: Oral  SpO2: 98%      Constitutional: NAD, calm, comfortable, visibly jaundiced Vitals:   03/03/17 0942  BP: (!) 127/54  Pulse: 79  Resp: 16  Temp: 98.2 F (36.8 C)  TempSrc: Oral  SpO2: 98%   Eyes: PERRL, lids and conjunctivae  icteric ENMT: Mucous membranes are moist. Posterior pharynx clear of any exudate or lesions..  Neck: normal, supple, no masses Respiratory: clear to auscultation bilaterally, decreased breath sound on right lower lung field, no wheezing, no crackles. Normal respiratory effort. No accessory muscle use.  Cardiovascular: Regular rate and rhythm, no murmurs / rubs / gallops. Trace extremity edema. 2+ pedal pulses.  Abdomen: Distended, not tense, no tenderness, no masses palpated. Bowel sounds positive.  Musculoskeletal: no clubbing / cyanosis. Normal muscle tone.  Skin: no rashes, lesions, ulcers. No induration Neurologic: CN 2-12 grossly intact. Strength 5/5 in all 4.  Psychiatric: Normal judgment and insight. Alert and oriented x 3. Normal mood.   Labs on Admission: I have personally reviewed following labs and imaging studies  CBC:  Recent Labs Lab 03/03/17 1016  WBC 7.6  NEUTROABS 5.2  HGB 7.0*  HCT 20.7*  MCV 124.7*  PLT 47*   Basic Metabolic Panel:  Recent  Labs Lab 03/03/17 1016  NA 140  K 2.9*  CL 98*  CO2 32  GLUCOSE 171*  BUN 18  CREATININE 1.28*  CALCIUM 8.9  MG 0.9*   GFR: CrCl cannot be calculated (Unknown ideal weight.). Liver Function Tests:  Recent Labs Lab 03/03/17 1016  AST 91*  ALT 33  ALKPHOS 118  BILITOT 9.0*  PROT 6.2*  ALBUMIN 2.1*   No results for input(s): LIPASE, AMYLASE in the last 168 hours. No results for input(s): AMMONIA in the last 168 hours. Coagulation Profile:  Recent Labs Lab 03/03/17 1016  INR 2.94   Cardiac Enzymes:  Recent Labs Lab 03/03/17 1016  TROPONINI <0.03   BNP (last 3 results) No results for input(s): PROBNP in the last 8760 hours. HbA1C: No results for input(s): HGBA1C in the last 72 hours. CBG: No results for input(s): GLUCAP in the last 168 hours. Lipid Profile: No results for input(s): CHOL, HDL, LDLCALC, TRIG, CHOLHDL, LDLDIRECT in the last 72 hours.  Thyroid Function Tests: No results for input(s): TSH, T4TOTAL, FREET4, T3FREE, THYROIDAB in the last 72 hours. Anemia Panel: No results for input(s): VITAMINB12, FOLATE, FERRITIN, TIBC, IRON, RETICCTPCT in the last 72 hours. Urine analysis:    Component Value Date/Time   COLORURINE YELLOW 11/24/2016 1616   APPEARANCEUR CLEAR 11/24/2016 1616   LABSPEC 1.009 11/24/2016 1616   PHURINE 7.0 11/24/2016 1616   GLUCOSEU NEGATIVE 11/24/2016 1616   HGBUR NEGATIVE 11/24/2016 1616   BILIRUBINUR NEGATIVE 11/24/2016 1616   KETONESUR NEGATIVE 11/24/2016 1616   PROTEINUR NEGATIVE 11/24/2016 1616   UROBILINOGEN 0.2 11/22/2012 0856   NITRITE NEGATIVE 11/24/2016 1616   LEUKOCYTESUR NEGATIVE 11/24/2016 1616     Radiological Exams on Admission: Dg Chest 2 View  Result Date: 03/03/2017 CLINICAL DATA:  Patient with abdominal swelling and ascites for 1 week. EXAM: CHEST  2 VIEW COMPARISON:  Chest radiograph 12/21/2016 FINDINGS: Monitoring leads overlie the patient. Stable enlarged cardiac and mediastinal contours. Elevation of  the right hemidiaphragm. Moderate right pleural effusion with underlying consolidation. Thoracic spine degenerative changes. IMPRESSION: Interval increase in size of moderate right pleural effusion with underlying consolidation. Electronically Signed   By: Lovey Newcomer M.D.   On: 03/03/2017 10:58    EKG: Independently reviewed.  Sinus rhythm, right bundle branch block  Assessment/Plan Active Problems:   Coagulopathy (HCC)   Acute kidney injury (HCC)   Thrombocytopenia (HCC)   End stage liver disease (HCC)   Alcoholic cirrhosis of liver with ascites (HCC)   Pleural effusion associated with hepatic disorder   Hypokalemia   Hypomagnesemia   Dyspnea    Dyspnea -Likely due to hepatic hydrothorax, patient has gone about 9 months since last thoracentesis and paracentesis, he is asking about repeat procedures, will order per radiology -He is not hypoxic, he is on room air  End-stage liver disease/alcoholic cirrhosis -Decompensated with evidence of ascites and hepatic hydrothorax, strongly encouraged patient to follow-up ASAP with his pathologist at [facility] or [facility] following discharge from here; Meld score is 29 -He has a history of SBP, will ask for fluid studies on the paracentesis and thoracentesis -Has a history of hepatic encephalopathy, he is alert and oriented 4 today and has no asterixis, continue lactulose -Has a history of esophageal varices, clinically does not appear to be bleeding, continue his home beta-blockers -Continue ciprofloxacin for prior history of SBP  Thrombocytopenia /coagulopathy -In the setting of liver disease  Anemia -Hemoglobin of 7.0, prior values between 8 and 10 -1 unit of packed red blood cells ordered by EDP -Clinically he does not have signs of overt GI bleed, however we will check fecal occult and placed on twice daily PPI  Hypokalemia/hypomagnesemia -Due to poor p.o. intake as well as being on diuretics at home  Mild elevation in creatinine -His GFR  is greater than 60 however his creatinine is mildly elevated.  He is already gotten home diuretics this morning, will hold for today and resume tomorrow, closely monitor renal function.   DVT prophylaxis: SCDs Code Status: Full code Family Communication: Discussed with wife at bedside Disposition Plan: Admit to MedSurg Consults called: None    Admission status: Observation  At the point of initial evaluation, it is my clinical opinion that admission for OBSERVATION is reasonable and necessary because the patient's presenting complaints in the context of their chronic conditions represent sufficient risk of deterioration or significant morbidity to constitute reasonable grounds for close observation in the hospital setting, but that the patient may be medically stable for discharge  from the hospital within 24 to 48 hours.   Marzetta Board, MD [facility] Pager [phone]  If 7PM-7AM, please contact night-coverage [email or web address] Password [facility]  03/03/2017, 11:49 AM

## 2017-03-03 NOTE — ED Triage Notes (Signed)
Pt reports abd swelling for the past week that has gotten progressively worse. Hx of liver problems. Had to have fluid removed from abd a year ago. Was able to eat/drink yesterday, but abd feels full.

## 2017-03-04 ENCOUNTER — Observation Stay (HOSPITAL_COMMUNITY): Payer: BLUE CROSS/BLUE SHIELD

## 2017-03-04 DIAGNOSIS — R739 Hyperglycemia, unspecified: Secondary | ICD-10-CM | POA: Diagnosis not present

## 2017-03-04 DIAGNOSIS — R0602 Shortness of breath: Secondary | ICD-10-CM | POA: Diagnosis not present

## 2017-03-04 DIAGNOSIS — I8500 Esophageal varices without bleeding: Secondary | ICD-10-CM | POA: Diagnosis present

## 2017-03-04 DIAGNOSIS — D696 Thrombocytopenia, unspecified: Secondary | ICD-10-CM

## 2017-03-04 DIAGNOSIS — Z83511 Family history of glaucoma: Secondary | ICD-10-CM | POA: Diagnosis not present

## 2017-03-04 DIAGNOSIS — E876 Hypokalemia: Secondary | ICD-10-CM | POA: Diagnosis present

## 2017-03-04 DIAGNOSIS — N179 Acute kidney failure, unspecified: Secondary | ICD-10-CM | POA: Diagnosis not present

## 2017-03-04 DIAGNOSIS — D62 Acute posthemorrhagic anemia: Secondary | ICD-10-CM | POA: Diagnosis not present

## 2017-03-04 DIAGNOSIS — J948 Other specified pleural conditions: Secondary | ICD-10-CM | POA: Diagnosis present

## 2017-03-04 DIAGNOSIS — R609 Edema, unspecified: Secondary | ICD-10-CM | POA: Diagnosis not present

## 2017-03-04 DIAGNOSIS — K219 Gastro-esophageal reflux disease without esophagitis: Secondary | ICD-10-CM | POA: Diagnosis present

## 2017-03-04 DIAGNOSIS — D649 Anemia, unspecified: Secondary | ICD-10-CM | POA: Diagnosis not present

## 2017-03-04 DIAGNOSIS — Z9889 Other specified postprocedural states: Secondary | ICD-10-CM | POA: Diagnosis not present

## 2017-03-04 DIAGNOSIS — R0600 Dyspnea, unspecified: Secondary | ICD-10-CM | POA: Diagnosis not present

## 2017-03-04 DIAGNOSIS — R7989 Other specified abnormal findings of blood chemistry: Secondary | ICD-10-CM | POA: Diagnosis present

## 2017-03-04 DIAGNOSIS — R188 Other ascites: Secondary | ICD-10-CM | POA: Diagnosis not present

## 2017-03-04 DIAGNOSIS — E785 Hyperlipidemia, unspecified: Secondary | ICD-10-CM | POA: Diagnosis present

## 2017-03-04 DIAGNOSIS — K7460 Unspecified cirrhosis of liver: Secondary | ICD-10-CM | POA: Diagnosis not present

## 2017-03-04 DIAGNOSIS — Z79899 Other long term (current) drug therapy: Secondary | ICD-10-CM | POA: Diagnosis not present

## 2017-03-04 DIAGNOSIS — I8501 Esophageal varices with bleeding: Secondary | ICD-10-CM | POA: Diagnosis not present

## 2017-03-04 DIAGNOSIS — K766 Portal hypertension: Secondary | ICD-10-CM | POA: Diagnosis not present

## 2017-03-04 DIAGNOSIS — D539 Nutritional anemia, unspecified: Secondary | ICD-10-CM | POA: Diagnosis not present

## 2017-03-04 DIAGNOSIS — K7031 Alcoholic cirrhosis of liver with ascites: Secondary | ICD-10-CM | POA: Diagnosis not present

## 2017-03-04 DIAGNOSIS — D689 Coagulation defect, unspecified: Secondary | ICD-10-CM

## 2017-03-04 DIAGNOSIS — Z820 Family history of epilepsy and other diseases of the nervous system: Secondary | ICD-10-CM | POA: Diagnosis not present

## 2017-03-04 DIAGNOSIS — Z833 Family history of diabetes mellitus: Secondary | ICD-10-CM | POA: Diagnosis not present

## 2017-03-04 DIAGNOSIS — I10 Essential (primary) hypertension: Secondary | ICD-10-CM | POA: Diagnosis present

## 2017-03-04 DIAGNOSIS — K7290 Hepatic failure, unspecified without coma: Secondary | ICD-10-CM | POA: Diagnosis not present

## 2017-03-04 DIAGNOSIS — I959 Hypotension, unspecified: Secondary | ICD-10-CM | POA: Diagnosis present

## 2017-03-04 DIAGNOSIS — J90 Pleural effusion, not elsewhere classified: Secondary | ICD-10-CM

## 2017-03-04 DIAGNOSIS — J918 Pleural effusion in other conditions classified elsewhere: Secondary | ICD-10-CM | POA: Diagnosis present

## 2017-03-04 DIAGNOSIS — K769 Liver disease, unspecified: Secondary | ICD-10-CM | POA: Diagnosis not present

## 2017-03-04 DIAGNOSIS — Z792 Long term (current) use of antibiotics: Secondary | ICD-10-CM | POA: Diagnosis not present

## 2017-03-04 DIAGNOSIS — D684 Acquired coagulation factor deficiency: Secondary | ICD-10-CM | POA: Diagnosis present

## 2017-03-04 DIAGNOSIS — Z881 Allergy status to other antibiotic agents status: Secondary | ICD-10-CM | POA: Diagnosis not present

## 2017-03-04 DIAGNOSIS — E8809 Other disorders of plasma-protein metabolism, not elsewhere classified: Secondary | ICD-10-CM | POA: Diagnosis not present

## 2017-03-04 DIAGNOSIS — D6959 Other secondary thrombocytopenia: Secondary | ICD-10-CM | POA: Diagnosis present

## 2017-03-04 LAB — COMPREHENSIVE METABOLIC PANEL
ALK PHOS: 113 U/L (ref 38–126)
ALT: 32 U/L (ref 17–63)
AST: 82 U/L — AB (ref 15–41)
Albumin: 2.2 g/dL — ABNORMAL LOW (ref 3.5–5.0)
Anion gap: 7 (ref 5–15)
BUN: 17 mg/dL (ref 6–20)
CALCIUM: 8.8 mg/dL — AB (ref 8.9–10.3)
CO2: 34 mmol/L — ABNORMAL HIGH (ref 22–32)
Chloride: 100 mmol/L — ABNORMAL LOW (ref 101–111)
Creatinine, Ser: 1.39 mg/dL — ABNORMAL HIGH (ref 0.61–1.24)
GFR calc Af Amer: >60 mL/min (ref >60)
GFR, EST NON AFRICAN AMERICAN: 56 mL/min — AB (ref >60)
Glucose, Bld: 97 mg/dL (ref 65–99)
Potassium: 3.2 mmol/L — ABNORMAL LOW (ref 3.5–5.1)
Sodium: 141 mmol/L (ref 135–145)
TOTAL PROTEIN: 5.9 g/dL — AB (ref 6.5–8.1)
Total Bilirubin: 9.6 mg/dL — ABNORMAL HIGH (ref 0.3–1.2)

## 2017-03-04 LAB — BODY FLUID CELL COUNT WITH DIFFERENTIAL
EOS FL: 0 %
Lymphs, Fluid: 89 %
Monocyte-Macrophage-Serous Fluid: 7 % — ABNORMAL LOW (ref 50–90)
Neutrophil Count, Fluid: 4 % (ref 0–25)
Total Nucleated Cell Count, Fluid: 9 cu mm (ref 0–1000)

## 2017-03-04 LAB — MAGNESIUM: Magnesium: 1.2 mg/dL — ABNORMAL LOW (ref 1.7–2.4)

## 2017-03-04 LAB — HIV ANTIBODY (ROUTINE TESTING W REFLEX): HIV Screen 4th Generation wRfx: NONREACTIVE

## 2017-03-04 MED ORDER — ALBUMIN HUMAN 25 % IV SOLN
25.0000 g | Freq: Once | INTRAVENOUS | Status: AC
  Administered 2017-03-04: 25 g via INTRAVENOUS
  Filled 2017-03-04 (×2): qty 100

## 2017-03-04 MED ORDER — MAGNESIUM SULFATE 4 GM/100ML IV SOLN
4.0000 g | Freq: Once | INTRAVENOUS | Status: AC
  Administered 2017-03-04: 4 g via INTRAVENOUS
  Filled 2017-03-04: qty 100

## 2017-03-04 MED ORDER — POTASSIUM CHLORIDE CRYS ER 20 MEQ PO TBCR
60.0000 meq | EXTENDED_RELEASE_TABLET | Freq: Once | ORAL | Status: AC
  Administered 2017-03-04: 60 meq via ORAL
  Filled 2017-03-04: qty 3

## 2017-03-04 NOTE — Progress Notes (Addendum)
PROGRESS NOTE    Thomas [name]  FUX:[id number] DOB: 10/21/1961 DOA: 03/03/2017 PCP: [name], MD    Brief Narrative:   Thomas [name] is a 56 y.o. male with medical history significant of alcoholic cirrhosis, of alcohol for the past year and currently enrolled in the transplant center at [facility] (he wants to transfer care to [facility] though), prior history of GI bleed, prior history of hepatic encephalopathy, prior history of spontaneous bacterial peritonitis, coagulopathy, as well as ascites and hepatic hydrothorax requiring paracentesis and thoracentesis in June of last year, presents to the emergency room with a chief complaint of abdominal swelling, poor appetite, acute on chronic nausea as well as shortness of breath with minimal activities.  Patient tells me that over the last week he has been progressively weak, short of breath, and barely able to do anything.  He is also complaining of abdominal distention, as well as early satiety and abdominal discomfort after he eats anything, and thus he has been having little to no p.o. intake over the last several days.  He denies any fevers however has had intermittent chills at home.  He denies any chest pain.  He denies any vomiting.  He has not seen any blood in his stool however has noticed dark stools, however he takes a lot of Pepto-Bismol and thinks it is related to that.  He reports compliance with his home medications.  ED Course: In the emergency room his vital signs are stable, he is afebrile, blood work shows mild creatinine elevation to 1.3, hypokalemia with a potassium of 2.9, hemoglobin of 7.0 and platelets of 47.  Chest x-ray shows moderate right-sided pleural effusion.  Discussed with EDP, he did a bedside ultrasound of his abdomen and showed moderate ascites not enough for him to safely perform a paracentesis at bedside.  TRH is asked for admission for anemia and dyspnea.   Assessment & Plan:   Principal Problem:   Dyspnea Active  Problems:   Alcoholic cirrhosis of liver with ascites (HCC)   Pleural effusion associated with hepatic disorder   Coagulopathy (HCC)   Alcoholic cirrhosis (HCC)   Acute kidney injury (HCC)   Thrombocytopenia (HCC)   Esophageal varices (HCC)   Acute blood loss anemia   End stage liver disease (HCC)   Hypokalemia   Hypomagnesemia   Symptomatic anemia  #1 dyspnea Likely multifactorial secondary to hepatic hydrothorax and abdominal ascites. Patient had gone 9 months since last thoracentesis and paracentesis. Chest x-ray with increasing right pleural effusion likely hepatic hydrothorax. Patient with sats on room air. Patient for thoracentesis and paracentesis. Follow.  #2 decompensated end-stage liver disease/alcoholic cirrhosis Patient with acute decompensation with evidence of ascites and hepatic hydrothorax. MELD score 29. Patient for paracentesis with albumin today and likely thoracentesis tomorrow. Patient with a prior history of SBP and currently on ciprofloxacin for SBP prophylaxis. Cultures from paracentesis and thoracentesis will be obtained. Patient with prior history of hepatic encephalopathy however currently stable. Patient with no asterixis. Continue lactulose. Continue none dolor. Patient also noted to have a prior history of esophageal varices. Patient with no overt bleeding however FOBT is positive. GI consultation. Patient will need close outpatient follow-up with his hepatologist at [facility].  #3 right pleural effusion likely hepatic hydrothorax Patient for thoracentesis 03/05/2017. Monitor.  #4 thrombocytopenia/coagulopathy The setting of liver disease. Follow.  #5 anemia Patient noted to have a positive FOBT. Patient with no overt bleeding. Patient with history of esophageal varices. Hemoglobin currently at 7.7 from 7.0 on admission.  Follow H&H. Continue PPI. Continue nadolol. GI consultation for further evaluation and management.  #6 hypokalemia/hypomagnesemia Likely  secondary to poor oral intake in the setting of diuretics. Replete.  #7 mild elevation in creatinine Hold diuretics.   DVT prophylaxis: SCDs Code Status: Full Family Communication: Updated patient and family at bedside. Disposition Plan: Home was abdominal pain with ascites has improved, shortness of breath improved and per GI.   Consultants:   Gastroenterology. Pending 03/04/2017  Procedures:   Paracentesis pending 03/04/2017  Thoracentesis pending  03/05/2017  Chest x-ray 03/03/2017  1 unit packed red blood cells transfusion 03/03/2017  Antimicrobials:   Oral ciprofloxacin prophylaxis 03/03/2017   Subjective: Patient states some improvement with shortness of breath since admission however not at baseline. Patient still with abdominal distention. Patient denies any chest pain, no shortness of breath, no abdominal pain.  Objective: Vitals:   03/04/17 1220 03/04/17 1230 03/04/17 1244 03/04/17 1335  BP: 116/71 (!) 90/37 (!) 104/52 (!) 102/56  Pulse:  80    Resp:  16    Temp:  98.3 F (36.8 C)    TempSrc:  Oral    SpO2:  93%    Weight:      Height:        Intake/Output Summary (Last 24 hours) at 03/04/17 1459 Last data filed at 03/03/17 1857  Gross per 24 hour  Intake             1055 ml  Output              200 ml  Net              855 ml   Filed Weights   03/03/17 1539 03/03/17 1615  Weight: 81.6 kg (180 lb) 83.8 kg (184 lb 12.8 oz)    Examination:  General exam: Appears calm and comfortable.Sclera icterus. Respiratory system: Decreased breath sounds on the right. CRespiratory effort normal. Cardiovascular system: S1 & S2 heard, RRR. No JVD, murmurs, rubs, gallops or clicks. No pedal edema. Gastrointestinal system: Abdomen is distended, soft and nontender. No organomegaly or masses felt. Normal bowel sounds heard. Central nervous system: Alert and oriented. No focal neurological deficits. Extremities: Symmetric 5 x 5 power. Skin: No rashes, lesions  or ulcers. Slightly jaundiced. Psychiatry: Judgement and insight appear normal. Mood & affect appropriate.     Data Reviewed: I have personally reviewed following labs and imaging studies  CBC:  Recent Labs Lab 03/03/17 1016 03/04/17 0342  WBC 7.6 8.4  NEUTROABS 5.2  --   HGB 7.0* 7.7*  HCT 20.7* 22.6*  MCV 124.7* 115.9*  PLT 47* 44*   Basic Metabolic Panel:  Recent Labs Lab 03/03/17 1016 03/04/17 0342  NA 140 141  K 2.9* 3.2*  CL 98* 100*  CO2 32 34*  GLUCOSE 171* 97  BUN 18 17  CREATININE 1.28* 1.39*  CALCIUM 8.9 8.8*  MG 0.9* 1.2*   GFR: Estimated Creatinine Clearance: 60.5 mL/min (A) (by C-G formula based on SCr of 1.39 mg/dL (H)). Liver Function Tests:  Recent Labs Lab 03/03/17 1016 03/04/17 0342  AST 91* 82*  ALT 33 32  ALKPHOS 118 113  BILITOT 9.0* 9.6*  PROT 6.2* 5.9*  ALBUMIN 2.1* 2.2*   No results for input(s): LIPASE, AMYLASE in the last 168 hours. No results for input(s): AMMONIA in the last 168 hours. Coagulation Profile:  Recent Labs Lab 03/03/17 1016  INR 2.94   Cardiac Enzymes:  Recent Labs Lab 03/03/17 1016  TROPONINI <0.03  BNP (last 3 results) No results for input(s): PROBNP in the last 8760 hours. HbA1C: No results for input(s): HGBA1C in the last 72 hours. CBG: No results for input(s): GLUCAP in the last 168 hours. Lipid Profile: No results for input(s): CHOL, HDL, LDLCALC, TRIG, CHOLHDL, LDLDIRECT in the last 72 hours. Thyroid Function Tests: No results for input(s): TSH, T4TOTAL, FREET4, T3FREE, THYROIDAB in the last 72 hours. Anemia Panel: No results for input(s): VITAMINB12, FOLATE, FERRITIN, TIBC, IRON, RETICCTPCT in the last 72 hours. Sepsis Labs: No results for input(s): PROCALCITON, LATICACIDVEN in the last 168 hours.  No results found for this or any previous visit (from the past 240 hour(s)).       Radiology Studies: Dg Chest 2 View  Result Date: 03/03/2017 CLINICAL DATA:  Patient with abdominal  swelling and ascites for 1 week. EXAM: CHEST  2 VIEW COMPARISON:  Chest radiograph 12/21/2016 FINDINGS: Monitoring leads overlie the patient. Stable enlarged cardiac and mediastinal contours. Elevation of the right hemidiaphragm. Moderate right pleural effusion with underlying consolidation. Thoracic spine degenerative changes. IMPRESSION: Interval increase in size of moderate right pleural effusion with underlying consolidation. Electronically Signed   By: Lovey Newcomer M.D.   On: 03/03/2017 10:58   US Paracentesis  Result Date: 03/04/2017 INDICATION: History of alcoholic cirrhosis. Abdominal discomfort and distention. Ascites. Request diagnostic and therapeutic paracentesis. EXAM: ULTRASOUND GUIDED RIGHT LOWER QUADRANT PARACENTESIS MEDICATIONS: None. COMPLICATIONS: None immediate. PROCEDURE: Informed written consent was obtained from the patient after a discussion of the risks, benefits and alternatives to treatment. A timeout was performed prior to the initiation of the procedure. Initial ultrasound scanning demonstrates a large amount of ascites within the right lower abdominal quadrant. The right lower abdomen was prepped and draped in the usual sterile fashion. 1% lidocaine was used for local anesthesia. Following this, a 19 gauge, 10-cm, Yueh catheter was introduced. An ultrasound image was saved for documentation purposes. The paracentesis was performed. The catheter was removed and a dressing was applied. The patient tolerated the procedure well without immediate post procedural complication. FINDINGS: A total of approximately 1.4 L of clear, amber colored fluid was removed. Samples were sent to the laboratory as requested by the clinical team. IMPRESSION: Successful ultrasound-guided paracentesis yielding 1.4 liters of peritoneal fluid. Read by: Ascencion Dike PA-C Electronically Signed   By: Markus Daft M.D.   On: 03/04/2017 12:22        Scheduled Meds: . ciprofloxacin  500 mg Oral QHS  .  furosemide  40 mg Oral BID  . lactulose  30 g Oral BID  . nadolol  40 mg Oral Daily  . pantoprazole (PROTONIX) IV  40 mg Intravenous Q12H  . spironolactone  100 mg Oral Daily   Continuous Infusions:   LOS: 0 days    Time spent: 54 minutes    THOMPSON,DANIEL, MD [facility] Pager [phone]  If 7PM-7AM, please contact night-coverage [email or web address] Password [facility] 03/04/2017, 2:59 PM

## 2017-03-04 NOTE — Procedures (Signed)
PROCEDURE SUMMARY:  Successful US guided paracentesis from RLQ.  Yielded 1.4 L of clear amber colored fluid.  No immediate complications.  Pt tolerated well.   Specimen was sent for labs.  Ascencion Dike PA-C 03/04/2017 12:21 PM

## 2017-03-04 NOTE — Consult Note (Signed)
Referring Provider: Dr. Grandville Silos Primary Care Physician:  [name], MD Primary Gastroenterologist:  Dr. Patsy Baltimore at [facility] was most recent.  Remotely know to Dr. Hilarie Fredrickson (October 2016)  Reason for Consultation:  Ascites, anemia with heme positive stool, cirrhosis  HPI: Thomas [name] is a 56 y.o. 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  He presented to the emergency room with a chief complaint of abdominal swelling, poor appetite, acute on chronic nausea as well as shortness of breath with minimal activities.  Patient reported that over the last week he has been progressively weak, short of breath, and barely able to do anything.  He also complained of abdominal distention, as well as early satiety and abdominal discomfort after he eats anything, and thus he has been having little to no p.o. intake over the last several days.  He denied any fevers, however, has had intermittent chills at home.  He denies any chest pain.  He denies any vomiting.  No sign of overt bleeding according to his wife but patient reported to the admitting attending that he's had some dark stools but contributed that to the Pepto-Bismol that he has been taking.  He reported compliance with his home medications.  Total bili 9, which is up from 4.4 three months ago, INR 2.94.  Getting paracentesis with albumin today, fluid being sent for cell count and culture.  Getting thoracentesis in AM.  Patient still getting lasix 40 mg BID and  spironolactone 100 mg daily, but Cr worsening.  Cr 1.39 today.  Baseline appears to be normal, <1.  Platelets down to 44.  Hgb was 7.0 grams on admission.  7.7 grams today s/p one unit PRBC's.  On lactulose at home.  No confusion recently.  04/19/16 EGD showed grade 1 varices and PHG. Colonoscopy at that time as follows: Non-thrombosed external hemorrhoids found on perianal exam. Non-bleeding internal hemorrhoids.  Congested mucosa in the sigmoid colon and in the descending colon, diverticulosis in the sigmoid colon.   Was told by the transplant psychologist at [facility] that he was a poor candidate for transplant, essentially failed his psychology evaluation.   Past Medical History:  Diagnosis Date  . Alcohol abuse 2013  . Alcoholic cirrhosis ([place]) 02/2950  . Anemia 02/2016   in setting of GI blood loss, but MCV macrocytic.   . Coagulopathy ([place]) 05/2015  . Diverticulosis 09/2015  . Esophageal varices ([place]) 09/2015   small  . GERD (gastroesophageal reflux disease)   . Hyperlipidemia   . Hypertension   . Portal hypertension ([place]) 09/2015   with portal gastropathy  . Thrombocytopenia ([place]) 02/2016  . Tubular adenoma of colon 09/2015    Past Surgical History:  Procedure Laterality Date  . ESOPHAGOGASTRODUODENOSCOPY N/A 03/05/2016   Procedure: ESOPHAGOGASTRODUODENOSCOPY (EGD);  Surgeon: Ladene Artist, MD;  Location: [name];  Service: Endoscopy;  Laterality: N/A;  . testicle prosthesis      Prior to Admission medications   Medication Sig Start Date End Date Taking? Authorizing Provider  bismuth subsalicylate (PEPTO BISMOL) 262  MG chewable tablet Chew 262-524 mg by mouth 3 (three) times daily as needed for indigestion.    Yes Historical Provider, MD  calcium carbonate (TUMS EX) 750 MG chewable tablet Chew 1-2 tablets by mouth 3 (three) times daily as needed for heartburn.    Yes Historical Provider, MD  ciprofloxacin (CIPRO) 500 MG tablet Take 500 mg by mouth at bedtime. 11/07/16  Yes  Historical Provider, MD  furosemide (LASIX) 40 MG tablet Take 40 mg by mouth 2 (two) times daily. 02/08/17  Yes Historical Provider, MD  lactulose (CHRONULAC) 10 GM/15ML solution Take 45 mLs (30 g total) by mouth 2 (two) times daily. 03/07/16  Yes Maryann Mikhail, DO  nadolol (CORGARD) 40 MG tablet TAKE 1 TABLET (40 MG TOTAL) BY MOUTH DAILY. 04/03/16  Yes [name], MD  pantoprazole (PROTONIX) 40 MG tablet TAKE 1 TABLET (40 MG TOTAL) BY MOUTH 2 (TWO) TIMES DAILY. Patient taking differently: TAKE 1 TABLET (40 MG TOTAL) BY MOUTH once daily 03/10/16  Yes [name], MD  spironolactone (ALDACTONE) 100 MG tablet Take 1 tablet (100 mg total) by mouth daily. Patient taking differently: Take 200 mg by mouth daily.  03/07/16  Yes Maryann Mikhail, DO    Current Facility-Administered Medications  Medication Dose Route Frequency Provider Last Rate Last Dose  . albumin human 25 % solution 25 g  25 g Intravenous Once Irine Seal V, MD      . ciprofloxacin (CIPRO) tablet 500 mg  500 mg Oral QHS Caren Griffins, MD   500 mg at 03/03/17 2106  . furosemide (LASIX) tablet 40 mg  40 mg Oral BID Caren Griffins, MD   40 mg at 03/04/17 0732  . lactulose (CHRONULAC) 10 GM/15ML solution 30 g  30 g Oral BID Caren Griffins, MD   30 g at 03/03/17 2106  . nadolol (CORGARD) tablet 40 mg  40 mg Oral Daily Costin Karlyne Greenspan, MD      . pantoprazole (PROTONIX) injection 40 mg  40 mg Intravenous Q12H Caren Griffins, MD   40 mg at 03/03/17 2106  . potassium chloride SA (K-DUR,KLOR-CON) CR tablet 60 mEq  60 mEq Oral Once Irine Seal V, MD      . spironolactone (ALDACTONE) tablet 100 mg  100 mg Oral Daily Costin Karlyne Greenspan, MD        Allergies as of 03/03/2017 - Review Complete 03/03/2017  Allergen Reaction Noted  . Rifaximin Swelling 11/25/2016    Family History  Problem Relation Age of Onset  . Diabetes Father   . Parkinson's disease Father   . Diabetes Paternal Aunt   . Glaucoma Mother     Social History    Social History  . Marital status: Married    Spouse name: Lelan Pons  . Number of children: 3  . Years of education: N/A   Occupational History  . Sales    Social History Main Topics  . Smoking status: Never Smoker  . Smokeless tobacco: Never Used  . Alcohol use No     Comment: Quit several months ago  . Drug use: No  . Sexual activity: Not on file   Other Topics Concern  . Not on file   Social History Narrative   Lives with wife.  Normally able to ambulate without assistance.    Review of Systems: ROS is O/W negative except as mentioned in HPI.  Physical Exam: Vital signs in last 24 hours: Temp:  [98.2 F (36.8 C)-98.9 F (37.2  C)] 98.6 F (37 C) (03/25 0423) Pulse Rate:  [67-80] 80 (03/25 0423) Resp:  [16-25] 16 (03/25 0423) BP: (108-133)/(54-68) 112/57 (03/25 0423) SpO2:  [89 %-100 %] 97 % (03/25 0423) Weight:  [180 lb (81.6 kg)-184 lb 12.8 oz (83.8 kg)] 184 lb 12.8 oz (83.8 kg) (03/24 1615) Last BM Date: 03/03/17 General:   Alert,  Well-developed, well-nourished, pleasant and cooperative in NAD Head:  Normocephalic and atraumatic. Eyes:  Sclera clear, no icterus.   Conjunctiva pink. Ears:  Normal auditory acuity. Mouth:  No deformity or lesions.   Lungs:  Clear throughout to auscultation.   No wheezes, crackles, or rhonchi.  Heart:  Regular rate and rhythm; no murmurs, clicks, rubs,  or gallops. Abdomen:  Soft,nontender, BS active,nonpalp mass or hsm.   Rectal:  Deferred  Msk:  Symmetrical without gross deformities. Pulses:  Normal pulses noted. Extremities:  Without clubbing or edema. Neurologic:  Alert and  oriented x4;  grossly normal neurologically. Skin:  Intact without significant lesions or rashes. Psych:  Alert and cooperative. Normal mood and affect.  Intake/Output from previous day: 03/24 0701 - 03/25 0700 In: 1885 [P.O.:720; I.V.:500; Blood:665] Out: 200 [Urine:200]  Lab Results:  Recent Labs  03/03/17 1016 03/04/17 0342  WBC 7.6 8.4    HGB 7.0* 7.7*  HCT 20.7* 22.6*  PLT 47* 44*   BMET  Recent Labs  03/03/17 1016 03/04/17 0342  NA 140 141  K 2.9* 3.2*  CL 98* 100*  CO2 32 34*  GLUCOSE 171* 97  BUN 18 17  CREATININE 1.28* 1.39*  CALCIUM 8.9 8.8*   LFT  Recent Labs  03/04/17 0342  PROT 5.9*  ALBUMIN 2.2*  AST 82*  ALT 32  ALKPHOS 113  BILITOT 9.6*   PT/INR  Recent Labs  03/03/17 1016  LABPROT 31.3*  INR 2.94   Studies/Results: Dg Chest 2 View  Result Date: 03/03/2017 CLINICAL DATA:  Patient with abdominal swelling and ascites for 1 week. EXAM: CHEST  2 VIEW COMPARISON:  Chest radiograph 12/21/2016 FINDINGS: Monitoring leads overlie the patient. Stable enlarged cardiac and mediastinal contours. Elevation of the right hemidiaphragm. Moderate right pleural effusion with underlying consolidation. Thoracic spine degenerative changes. IMPRESSION: Interval increase in size of moderate right pleural effusion with underlying consolidation. Electronically Signed   By: Lovey Newcomer M.D.   On: 03/03/2017 10:58   IMPRESSION:  -Cirrhosis:  Decompensated.  MELD score 29 with entering labs from 3/24.  Last seen by GI/hepatology, Dr. Patsy Baltimore, at [facility] 07/2016.  Was told not a candidate for transplant at that time.  Patient waiting for his PCP to get him into [facility].  Total bili 9, which is up from 4.4 three months ago.  Is due for [facility] screening so will order ultrasound with doppler (this is what was last recommended by transplant at [facility], he was due for this in February) and AFP. -Coagulopathy due to liver disease:  INR 2.94 -Ascites and pleural effusion/hepatic hydrothorax:  History of SBP and on cipro daily for prophylaxis.  Getting paracentesis with albumin today, fluid being sent for cell count and culture.  Getting thoracentesis in AM.  Patient still getting lasix 40 mg BID and spironolactone 100 mg daily, but Cr worsening. -Thrombocytopenia:  Secondary to liver disease.  Platelets down to 44. -Acute on chronic anemia  with heme positive stools:  Hgb 7 grams on admission, 7.7 grams today after one unit PRBC's.  EGD 04/2016 at [facility] showed one column of grade 1 esophageal varices and diffuse  protal hypertensive gastropathy.  May be having slow ooze from Portal gastropathy.  No sign of overt bleeding.  Has never had a variceal bleed.  Is on Nadolol 40 mg daily. -History of HE:  On lactulose at home and there has been no confusion currently. -AKI:  Cr 1.39 today.  Normally <1 at baseline.  PLAN: -Monitor labs including LFT's, renal function, electrolytes, INR, blood counts. -Follow-up studies from paracentesis. -Pending Cr trends, may need to back off on Lasix. -Continue pantoprazole 40 mg IV BID.     ZEHR, JESSICA D.  03/04/2017, 8:41 AM  Pager number [phone]  GI ATTENDING  History, laboratories, prior endoscopy and colonoscopy reports, x-rays, outside reports reviewed. Patient personally seen and examined. Agree with comprehensive consultation note as outlined above. Wife and son in room. Patient has advanced alcoholic liver disease. Presents to the hospital with chief complaint of progressive difficulty breathing and increased abdominal distention. Found to have ascites and moderately large right sided pleural effusion. He also has multiple electrolyte abnormalities including low magnesium and potassium. Has renal insufficiency. Markedly macrocytic anemia. Heme positive stool. Last EGD with insignificant varices and portal gastropathy. Last colonoscopy with polyps and diverticulosis. Up-to-date on surveillance. Liver tests consistent with alcohol pattern. On Lasix, nadolol, and lactulose at home. Has a history of hepatic encephalopathy. Emphatically denies alcohol use. Wife supports this report. 1.4 L paracentesis. No SBP. Feels better. Thoracentesis tomorrow. Current MELD score 30. Told he was a noncandidate for transplantation at [facility]. They tell us that "they just didn't like some people there and want to go to  [facility]". It is not clear to me whether he is risen on a transplant candidate. This needs to be clarified. His overall prognosis without transplantation is poor. GI will follow.  Docia Chuck. Geri [place]in[place]., M.D. [facility] Division of Gastroenterology

## 2017-03-05 ENCOUNTER — Other Ambulatory Visit: Payer: Self-pay | Admitting: Nurse Practitioner

## 2017-03-05 ENCOUNTER — Inpatient Hospital Stay (HOSPITAL_COMMUNITY): Payer: BLUE CROSS/BLUE SHIELD

## 2017-03-05 DIAGNOSIS — D649 Anemia, unspecified: Secondary | ICD-10-CM

## 2017-03-05 DIAGNOSIS — N179 Acute kidney failure, unspecified: Secondary | ICD-10-CM

## 2017-03-05 DIAGNOSIS — J918 Pleural effusion in other conditions classified elsewhere: Secondary | ICD-10-CM

## 2017-03-05 DIAGNOSIS — I8500 Esophageal varices without bleeding: Secondary | ICD-10-CM

## 2017-03-05 DIAGNOSIS — K7290 Hepatic failure, unspecified without coma: Secondary | ICD-10-CM

## 2017-03-05 DIAGNOSIS — K769 Liver disease, unspecified: Secondary | ICD-10-CM

## 2017-03-05 DIAGNOSIS — Z9889 Other specified postprocedural states: Secondary | ICD-10-CM

## 2017-03-05 LAB — COMPREHENSIVE METABOLIC PANEL
ALBUMIN: 2.4 g/dL — AB (ref 3.5–5.0)
ALT: 28 U/L (ref 17–63)
ANION GAP: 6 (ref 5–15)
AST: 78 U/L — ABNORMAL HIGH (ref 15–41)
Alkaline Phosphatase: 109 U/L (ref 38–126)
BUN: 12 mg/dL (ref 6–20)
CALCIUM: 8.8 mg/dL — AB (ref 8.9–10.3)
CHLORIDE: 100 mmol/L — AB (ref 101–111)
CO2: 33 mmol/L — AB (ref 22–32)
Creatinine, Ser: 1.19 mg/dL (ref 0.61–1.24)
GFR calc Af Amer: >60 mL/min (ref >60)
GFR calc non Af Amer: >60 mL/min (ref >60)
GLUCOSE: 98 mg/dL (ref 65–99)
POTASSIUM: 3.2 mmol/L — AB (ref 3.5–5.1)
SODIUM: 139 mmol/L (ref 135–145)
Total Bilirubin: 8.9 mg/dL — ABNORMAL HIGH (ref 0.3–1.2)
Total Protein: 6 g/dL — ABNORMAL LOW (ref 6.5–8.1)

## 2017-03-05 LAB — CBC WITH DIFFERENTIAL/PLATELET
BASOS PCT: 0 %
Basophils Absolute: 0 10*3/uL (ref 0.0–0.1)
Eosinophils Absolute: 0.3 10*3/uL (ref 0.0–0.7)
Eosinophils Relative: 5 %
HEMATOCRIT: 22.5 % — AB (ref 39.0–52.0)
HEMOGLOBIN: 7.7 g/dL — AB (ref 13.0–17.0)
LYMPHS ABS: 1.2 10*3/uL (ref 0.7–4.0)
LYMPHS PCT: 18 %
MCH: 39.9 pg — ABNORMAL HIGH (ref 26.0–34.0)
MCHC: 34.2 g/dL (ref 30.0–36.0)
MCV: 116.6 fL — AB (ref 78.0–100.0)
MONOS PCT: 15 %
Monocytes Absolute: 1.1 10*3/uL — ABNORMAL HIGH (ref 0.1–1.0)
NEUTROS ABS: 4.4 10*3/uL (ref 1.7–7.7)
NEUTROS PCT: 63 %
Platelets: 38 10*3/uL — ABNORMAL LOW (ref 150–400)
RBC: 1.93 MIL/uL — ABNORMAL LOW (ref 4.22–5.81)
RDW: 27.1 % — ABNORMAL HIGH (ref 11.5–15.5)
WBC: 7 10*3/uL (ref 4.0–10.5)

## 2017-03-05 LAB — CBC
HCT: 22.6 % — ABNORMAL LOW (ref 39.0–52.0)
Hemoglobin: 7.7 g/dL — ABNORMAL LOW (ref 13.0–17.0)
MCH: 39.5 pg — ABNORMAL HIGH (ref 26.0–34.0)
MCHC: 34.1 g/dL (ref 30.0–36.0)
MCV: 115.9 fL — ABNORMAL HIGH (ref 78.0–100.0)
PLATELETS: 44 10*3/uL — AB (ref 150–400)
RBC: 1.95 MIL/uL — ABNORMAL LOW (ref 4.22–5.81)
RDW: 28.1 % — AB (ref 11.5–15.5)
WBC: 8.4 10*3/uL (ref 4.0–10.5)

## 2017-03-05 LAB — TYPE AND SCREEN
ABO/RH(D): A POS
Antibody Screen: NEGATIVE
Unit division: 0

## 2017-03-05 LAB — BODY FLUID CELL COUNT WITH DIFFERENTIAL
Eos, Fluid: 1 %
LYMPHS FL: 47 %
Monocyte-Macrophage-Serous Fluid: 40 % — ABNORMAL LOW (ref 50–90)
Neutrophil Count, Fluid: 12 % (ref 0–25)
WBC FLUID: 294 uL (ref 0–1000)

## 2017-03-05 LAB — BPAM RBC
Blood Product Expiration Date: 201804162359
ISSUE DATE / TIME: 201803241344
Unit Type and Rh: 6200

## 2017-03-05 LAB — PROTIME-INR
INR: 2.99
Prothrombin Time: 31.7 seconds — ABNORMAL HIGH (ref 11.4–15.2)

## 2017-03-05 LAB — PATHOLOGIST SMEAR REVIEW

## 2017-03-05 LAB — GRAM STAIN

## 2017-03-05 LAB — MAGNESIUM: MAGNESIUM: 1.5 mg/dL — AB (ref 1.7–2.4)

## 2017-03-05 MED ORDER — POTASSIUM CHLORIDE CRYS ER 20 MEQ PO TBCR
60.0000 meq | EXTENDED_RELEASE_TABLET | Freq: Once | ORAL | Status: AC
  Administered 2017-03-05: 60 meq via ORAL
  Filled 2017-03-05: qty 3

## 2017-03-05 MED ORDER — MAGNESIUM SULFATE 4 GM/100ML IV SOLN
4.0000 g | Freq: Once | INTRAVENOUS | Status: AC
  Administered 2017-03-05: 4 g via INTRAVENOUS
  Filled 2017-03-05: qty 100

## 2017-03-05 MED ORDER — GI COCKTAIL ~~LOC~~
30.0000 mL | Freq: Three times a day (TID) | ORAL | Status: DC | PRN
  Administered 2017-03-05: 30 mL via ORAL
  Filled 2017-03-05: qty 30

## 2017-03-05 MED ORDER — TRAMADOL HCL 50 MG PO TABS: 100.0000 mg | ORAL_TABLET | Freq: Four times a day (QID) | ORAL | Status: DC | PRN

## 2017-03-05 MED ORDER — SIMETHICONE 80 MG PO CHEW
160.0000 mg | CHEWABLE_TABLET | Freq: Four times a day (QID) | ORAL | Status: DC
  Administered 2017-03-05 – 2017-03-06 (×5): 160 mg via ORAL
  Filled 2017-03-05 (×5): qty 2

## 2017-03-05 MED ORDER — POTASSIUM CHLORIDE CRYS ER 20 MEQ PO TBCR: 40.0000 meq | EXTENDED_RELEASE_TABLET | Freq: Once | ORAL | Status: DC

## 2017-03-05 MED ORDER — SODIUM CHLORIDE 0.9 % IV BOLUS (SEPSIS)
500.0000 mL | Freq: Once | INTRAVENOUS | Status: AC
  Administered 2017-03-05: 500 mL via INTRAVENOUS

## 2017-03-05 NOTE — Progress Notes (Signed)
Pt complaining of not feeling well, states his abdomen feels very full. Trying to belch to get some relief. States feeling dizzy and vision blurry. He was sitting on the edge of bed states his stomach feels better when he sits up. BP 137/121. Dr. Grandville Silos informed of BP done manually 72/44. Pt asked to lay back in bed and given a 500 ml bolus as ordered. After bolus pt states feeling much better. BP 96/50. Stomach feels better also.

## 2017-03-05 NOTE — Procedures (Signed)
PROCEDURE SUMMARY:  Successful US guided right diagnostic and therapeutic thoracentesis. Yielded 750 mL of blood-tinged fluid. Pt tolerated procedure well. No immediate complications.  Specimen was sent for labs. CXR ordered.  Docia Barrier PA-C 03/05/2017 1:23 PM

## 2017-03-05 NOTE — Progress Notes (Addendum)
[facility] Progress Note  Chief Complaint:   Cirrhosis, ascites  Subjective: Waiting on thoracentesis to be done. No complaints  Objective:  Vital signs in last 24 hours: Temp:  [98.3 F (36.8 C)-98.6 F (37 C)] 98.6 F (37 C) (03/26 0425) Pulse Rate:  [77-80] 80 (03/26 0425) Resp:  [14-16] 14 (03/26 0425) BP: (90-129)/(37-71) 110/60 (03/26 0425) SpO2:  [93 %-97 %] 94 % (03/26 0425) Last BM Date: 03/05/17 General:   Alert, well-developed, white male in NAD EENT:  Normal hearing, non icteric sclera, conjunctive pink.  Heart:  Regular rate and rhythm; no murmurs. 1+ BLE edema Pulm: Normal respiratory effort,decreased breath sounds and crackles at both bases.  Abdomen:  Soft, slightly distended, nontender.  Normal bowel sounds, no masses felt. No hepatomegaly.    Neurologic:  Alert and  oriented x4;  grossly normal neurologically. Psych:  Alert and cooperative. Normal mood and affect.  Intake/Output from previous day: 03/25 0701 - 03/26 0700 In: 680 [P.O.:480; IV Piggyback:200] Out: -  Intake/Output this shift: No intake/output data recorded.  Lab Results:  Recent Labs  03/03/17 1016 03/04/17 0342 03/05/17 0426  WBC 7.6 8.4 7.0  HGB 7.0* 7.7* 7.7*  HCT 20.7* 22.6* 22.5*  PLT 47* 44* 38*   BMET  Recent Labs  03/03/17 1016 03/04/17 0342 03/05/17 0426  NA 140 141 139  K 2.9* 3.2* 3.2*  CL 98* 100* 100*  CO2 32 34* 33*  GLUCOSE 171* 97 98  BUN 18 17 12   CREATININE 1.28* 1.39* 1.19  CALCIUM 8.9 8.8* 8.8*   LFT  Recent Labs  03/05/17 0426  PROT 6.0*  ALBUMIN 2.4*  AST 78*  ALT 28  ALKPHOS 109  BILITOT 8.9*   PT/INR  Recent Labs  03/03/17 1016 03/05/17 0426  LABPROT 31.3* 31.7*  INR 2.94 2.99     Dg Chest 2 View  Result Date: 03/03/2017 CLINICAL DATA:  Patient with abdominal swelling and ascites for 1 week. EXAM: CHEST  2 VIEW COMPARISON:  Chest radiograph 12/21/2016 FINDINGS: Monitoring leads overlie the patient.  Stable enlarged cardiac and mediastinal contours. Elevation of the right hemidiaphragm. Moderate right pleural effusion with underlying consolidation. Thoracic spine degenerative changes. IMPRESSION: Interval increase in size of moderate right pleural effusion with underlying consolidation. Electronically Signed   By: Lovey Newcomer M.D.   On: 03/03/2017 10:58   US Paracentesis  Result Date: 03/04/2017 INDICATION: History of alcoholic cirrhosis. Abdominal discomfort and distention. Ascites. Request diagnostic and therapeutic paracentesis. EXAM: ULTRASOUND GUIDED RIGHT LOWER QUADRANT PARACENTESIS MEDICATIONS: None. COMPLICATIONS: None immediate. PROCEDURE: Informed written consent was obtained from the patient after a discussion of the risks, benefits and alternatives to treatment. A timeout was performed prior to the initiation of the procedure. Initial ultrasound scanning demonstrates a large amount of ascites within the right lower abdominal quadrant. The right lower abdomen was prepped and draped in the usual sterile fashion. 1% lidocaine was used for local anesthesia. Following this, a 19 gauge, 10-cm, Yueh catheter was introduced. An ultrasound image was saved for documentation purposes. The paracentesis was performed. The catheter was removed and a dressing was applied. The patient tolerated the procedure well without immediate post procedural complication. FINDINGS: A total of approximately 1.4 L of clear, amber colored fluid was removed. Samples were sent to the laboratory as requested by the clinical team. IMPRESSION: Successful ultrasound-guided paracentesis yielding 1.4 liters of peritoneal fluid. Read by: Ascencion Dike PA-C Electronically Signed   By: Scherrie Gerlach.D.  On: 03/04/2017 12:22    Assessment / Plan: 77. 56 yo male with ETOH Cirrhosis complicated by portal hypertension. He has a history a hepatic encephalopathy, on lactulose at home. He also has a hx of SBP, on chronic cipro.  He was  previously followed at [facility] and doesn't want to go back, he is interested in [place] referral.  Patient admitted with SOB and abdominal distention. He is s/p paracentesis yesterday, 1.4 liters off. No SBP. 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 AFP pending. Also ultrasound with dopplers already ordered (transplants center requested dopplers). Perhaps he could go home later today after thoracentesis. Will need follow up with Dr. Hilarie Fredrickson and labs next week to check renal function.   2. Macrocytic anemia. No signs of overt bleeding. Last EGDs in March here and in May at [facility] - grade I esoph varices and PHG. On nadolol at home.   .Principal Problem:   Dyspnea Active Problems:   Coagulopathy (HCC)   Alcoholic cirrhosis (HCC)   Acute kidney injury ([place])   Thrombocytopenia (HCC)   Esophageal varices (HCC)   Acute blood loss anemia   End stage liver disease (HCC)   Alcoholic cirrhosis of liver with ascites (HCC)   Pleural effusion associated with hepatic disorder   Hypokalemia   Hypomagnesemia   Symptomatic anemia    LOS: 1 day   [name] NP 03/05/2017, 9:53 AM  Pager number [phone]     Attending physician's note   I have taken an interval history, reviewed the chart and examined the patient. I agree with the Advanced Practitioner's note, impression and recommendations. If stable after thoracentesis he would be ok for discharge later today from GI standpoint. Continue Lasix 40 mg bid and Aldactone 100 mg qd for now. Continue Cipro, nadolol and lactulose at current dosing. Outpatient follow up with Dr. Hilarie Fredrickson and check BMET in 1 week. GI signing off.   Lucio Edward, MD [facility] [phone] Mon-Fri 8a-5p [phone] after 5p, weekends, holidays

## 2017-03-05 NOTE — Discharge Summary (Addendum)
Physician Discharge Summary  Thomas [name] TZG:[id number] DOB: [birth year] DOA: 03/03/2017  PCP: Thomas Coffin, MD  Admit date: 03/03/2017 Discharge date: 03/06/2017  Time spent: 65 minutes  Recommendations for Outpatient Follow-up:  1. Follow-up at [facility] gastroenterology on Thursday, 03/08/2017 for lab work of [name]. 2. Follow up with Thomas [name], [place], gastroenterology. Office will call for appointment time. On follow-up labs obtained when he to be followed up upon. Patient may need a CBC done to follow-up on his H&H. Patient interested and transferring his care from transplant center at [facility] to transplant center at [facility] and referral will be made on follow-up by gastroenterology. 3. Follow-up with Thomas Coffin, MD in 2 weeks. On follow-up patient in need a CBC as well as a basic metabolic profile, magnesium done to follow-up on electrolytes renal function and hemoglobin.   Discharge Diagnoses:  Principal Problem:   Dyspnea Active Problems:   Alcoholic cirrhosis of liver with ascites (HCC)   Pleural effusion associated with hepatic disorder   Coagulopathy (HCC)   Alcoholic cirrhosis (HCC)   Acute kidney injury (HCC)   Thrombocytopenia (HCC)   Esophageal varices (HCC)   Acute blood loss anemia   End stage liver disease (HCC)   Hypokalemia   Hypomagnesemia   Symptomatic anemia   S/P thoracentesis   Discharge Condition: Stable and improved  Diet recommendation: Heart healthy  Filed Weights   03/03/17 1539 03/03/17 1615  Weight: 81.6 kg (180 lb) 83.8 kg (184 lb 12.8 oz)    History of present illness:  Per Dr. Hilario Quarry [name] is a 56 y.o. male with medical history significant of alcoholic cirrhosis, of alcohol for the past year and currently enrolled in the transplant center at [facility] (he wants to transfer care to [facility] though), prior history of GI bleed, prior history of hepatic encephalopathy, prior history of spontaneous bacterial peritonitis, coagulopathy, as well as ascites  and hepatic hydrothorax requiring paracentesis and thoracentesis in June of last year, presents to the emergency room with a chief complaint of abdominal swelling, poor appetite, acute on chronic nausea as well as shortness of breath with minimal activities.  Patient told admitting MD, that over the last week he has been progressively weak, short of breath, and barely able to do anything.  He was also complaining of abdominal distention, as well as early satiety and abdominal discomfort after he ate anything, and thus he had been having little to no p.o. intake over the last several days.  He denied any fevers however has had intermittent chills at home.  He denied any chest pain.  He denied any vomiting.  He had not seen any blood in his stool however had noticed dark stools, however he takes a lot of Pepto-Bismol and thinks it is related to that.  He reported compliance with his home medications.  ED Course: In the emergency room his vital signs are stable, he is afebrile, blood work shows mild creatinine elevation to 1.3, hypokalemia with a potassium of 2.9, hemoglobin of 7.0 and platelets of 47.  Chest x-ray shows moderate right-sided pleural effusion.  Discussed with EDP, he did a bedside ultrasound of his abdomen and showed moderate ascites not enough for him to safely perform a paracentesis at bedside.  TRH is asked for admission for anemia and dyspnea.  Hospital Course:  #1 dyspnea Likely multifactorial secondary to hepatic hydrothorax and abdominal ascites. Patient had gone 9 months since last thoracentesis and paracentesis. Chest x-ray with increasing right pleural effusion likely hepatic hydrothorax.  Patient with sats on room air. Patient s/p thoracentesis and paracentesis with clinical improvement.  #2 decompensated end-stage liver disease/alcoholic cirrhosis Patient with acute decompensation with evidence of ascites and hepatic hydrothorax. MELD score 29. Patient underwent paracentesis with  albumin given (03/04/2017) with 1.4L of amber fluid removed which was negative for SBP.Patient underwent thoracentesis (03/05/2017) with 750 mL of blood-tinged fluid removed labs pending at time of discharge. Patient with a prior history of SBP and maintained on home regimen of ciprofloxacin for SBP prophylaxis.  Patient with prior history of hepatic encephalopathy however remained stable. Patient with no asterixis. Continued on home regimen of lactulose.  patient's Lasix and Aldactone were initially held as subsequently resumed by day of discharge 1/2 his home dose as patient became hypotensive and lightheaded on 03/05/2017 with hypotension that resolved with holding diuretics and IV fluids. Patient also noted to have a prior history of esophageal varices. Patient with no overt bleeding however FOBT is positive.  patient was transfused 1 unit packed red blood cells and hemoglobin stabilized. Patient was followed by GI during the hospitalization and patient will have close follow-up in the outpatient setting. Patient will need close outpatient follow-up with his hepatologist at [facility] vs new referral to [facility]..  #3 right pleural effusion likely hepatic hydrothorax Patient s/p thoracentesis 03/05/2017 with 750 mL of blood-tinged fluid removed. Patient with prior history of hepatic hydrothorax and ascites and alcoholic cirrhosis and likely recurrent pleural effusion secondary to hepatic hydrothorax. Labs were pending on day of discharge. Outpatient follow-up.  #4 thrombocytopenia/coagulopathy The setting of liver disease. Patient has no overt bleeding during the hospitalization. Outpatient follow-up.   #5 anemia Patient noted to have a positive FOBT. Patient with no overt bleeding. Patient with history of esophageal varices. Hemoglobin currently at 7.7 from 7.0 on admission after transfusion of 1 unit packed red blood cells. Patient's hemoglobin remained stable. Patient was maintained on a PPI nadolol. Patient  was seen in consultation by GI who recommended close follow-up. Patient will follow-up with GI in the outpatient setting.  #6 hypokalemia/hypomagnesemia Likely secondary to poor oral intake in the setting of diuretics. Repleted. Will need outpatient follow-up.  #7 mild elevation in creatinine Diuretics were initially held on admission and subsequently resumed by discharge.   #8 hypotension Patient was to be discharged on 03/05/2017 however just prior to discharge patient had complaints of lightheadedness and dizziness blood pressure obtained at systolic blood pressures in the 60s. Discharge was subsequently canceled. Patient was given a bolus of IV fluids. Patient subsequently placed on IV fluids. Patient's blood pressure improved. Patient's symptoms resolved. Patient be discharged home on half his home dose of diuretics with close outpatient follow-up with GI.  Procedures:  Paracentesis pending 03/04/2017---1.4L clear amber colored fluid removed, Ascencion Dike, PA  Thoracentesis  03/05/2017---750 cc of blood tinged fluid removed Per [facility]  Chest x-ray 03/03/2017, 03/05/2017  1 unit packed red blood cells transfusion 03/03/2017  Abdominal ultrasound with Dopplers 03/05/2017   Consultations:  Gastroenterology. Dr Henrene Pastor 03/04/2017  Discharge Exam: Vitals:   03/06/17 0945 03/06/17 1419  BP: (!) 98/56 120/61  Pulse: 68 67  Resp:  18  Temp:  98.6 F (37 C)    General: NAD Cardiovascular: RRR Respiratory: CTAB  Discharge Instructions   Discharge Instructions    Diet - low sodium heart healthy    Complete by:  As directed    Increase activity slowly    Complete by:  As directed      Current Discharge  Medication List    CONTINUE these medications which have CHANGED   Details  furosemide (LASIX) 40 MG tablet Take 1 tablet (40 mg total) by mouth daily. Qty: 30 tablet, Refills: 0    spironolactone (ALDACTONE) 100 MG tablet Take 0.5 tablets (50 mg total)  by mouth daily. Qty: 30 tablet, Refills: 0      CONTINUE these medications which have NOT CHANGED   Details  bismuth subsalicylate (PEPTO BISMOL) 262 MG chewable tablet Chew 262-524 mg by mouth 3 (three) times daily as needed for indigestion.     calcium carbonate (TUMS EX) 750 MG chewable tablet Chew 1-2 tablets by mouth 3 (three) times daily as needed for heartburn.     ciprofloxacin (CIPRO) 500 MG tablet Take 500 mg by mouth at bedtime.    lactulose (CHRONULAC) 10 GM/15ML solution Take 45 mLs (30 g total) by mouth 2 (two) times daily. Qty: 240 mL, Refills: 0    nadolol (CORGARD) 40 MG tablet TAKE 1 TABLET (40 MG TOTAL) BY MOUTH DAILY. Qty: 30 tablet, Refills: 5    pantoprazole (PROTONIX) 40 MG tablet TAKE 1 TABLET (40 MG TOTAL) BY MOUTH 2 (TWO) TIMES DAILY. Qty: 60 tablet, Refills: 0       Allergies  Allergen Reactions  . Rifaximin Swelling   Follow-up Information    Thomas Savoy, NP Follow up.   Specialty:  Gastroenterology Why:  office will call with appointment time Contact information: [place] [address] [phone]        [facility] Follow up on 03/09/2017.   Specialty:  Gastroenterology Why:  follow up on Friday morning for BMET only, go to basement in office for labwork. Contact information: [place] [phone]       Thomas Coffin, MD. Schedule an appointment as soon as possible for a visit in 2 week(s).   Specialty:  Family Medicine Contact information: [address] [place] [address] [phone]            The results of significant diagnostics from this hospitalization (including imaging, microbiology, ancillary and laboratory) are listed below for reference.    Significant Diagnostic Studies: Dg Chest 1 View  Result Date: 03/05/2017 CLINICAL DATA:  Right pleural effusion.  Status post thoracentesis. EXAM: CHEST 1 VIEW COMPARISON:   03/03/2017 and 12/21/2016 FINDINGS: There is only a tiny residual right pleural effusion. No pneumothorax. Heart size and pulmonary vascularity are normal. Lungs are clear. Bones appear normal. IMPRESSION: Tiny residual right pleural effusion.  No pneumothorax. Electronically Signed   By: Lorriane Shire M.D.   On: 03/05/2017 13:43   Dg Chest 2 View  Result Date: 03/03/2017 CLINICAL DATA:  Patient with abdominal swelling and ascites for 1 week. EXAM: CHEST  2 VIEW COMPARISON:  Chest radiograph 12/21/2016 FINDINGS: Monitoring leads overlie the patient. Stable enlarged cardiac and mediastinal contours. Elevation of the right hemidiaphragm. Moderate right pleural effusion with underlying consolidation. Thoracic spine degenerative changes. IMPRESSION: Interval increase in size of moderate right pleural effusion with underlying consolidation. Electronically Signed   By: Lovey Newcomer M.D.   On: 03/03/2017 10:58   [place] Art/ven Flow Abd Pelv Doppler  Result Date: 03/05/2017 CLINICAL DATA:  Cirrhosis, alcoholic. Screening for hepatoma. Doppler study recommended by transplant center. EXAM: DUPLEX ULTRASOUND OF LIVER TECHNIQUE: Color and duplex Doppler ultrasound was performed to evaluate the hepatic in-flow and out-flow vessels. COMPARISON:  03/07/2016 FINDINGS: Portal Vein 0.9 cm diameter, without evidence of  occlusion or thrombus. Velocities (All hepatopetal): Main:  17-26 cm/sec Right:  21 cm/sec Left:  16 cm/sec Hepatic Vein Velocities (all hepatofugal): Right:  39 cm/sec Middle:  22 cm/sec Left:  22 cm/sec Hepatic Artery Velocity:  144 cm/sec Spleen 11.4 x 13.7 x 5.8 cm (volume = 470 cm^3). Splenic Vein without evidence of occlusion or thrombus, Velocity: 20.8 cm/sec Varices: None visualized Ascites: Present, moderate IMPRESSION: 1. Unremarkable hepatic vascular Doppler evaluation. 2. Splenomegaly and ascites suggesting a degree of portal venous hypertension. Electronically Signed   By: Lucrezia Europe M.D.   On:  03/05/2017 15:49   US Paracentesis  Result Date: 03/04/2017 INDICATION: History of alcoholic cirrhosis. Abdominal discomfort and distention. Ascites. Request diagnostic and therapeutic paracentesis. EXAM: ULTRASOUND GUIDED RIGHT LOWER QUADRANT PARACENTESIS MEDICATIONS: None. COMPLICATIONS: None immediate. PROCEDURE: Informed written consent was obtained from the patient after a discussion of the risks, benefits and alternatives to treatment. A timeout was performed prior to the initiation of the procedure. Initial ultrasound scanning demonstrates a large amount of ascites within the right lower abdominal quadrant. The right lower abdomen was prepped and draped in the usual sterile fashion. 1% lidocaine was used for local anesthesia. Following this, a 19 gauge, 10-cm, Yueh catheter was introduced. An ultrasound image was saved for documentation purposes. The paracentesis was performed. The catheter was removed and a dressing was applied. The patient tolerated the procedure well without immediate post procedural complication. FINDINGS: A total of approximately 1.4 L of clear, amber colored fluid was removed. Samples were sent to the laboratory as requested by the clinical team. IMPRESSION: Successful ultrasound-guided paracentesis yielding 1.4 liters of peritoneal fluid. Read by: Ascencion Dike PA-C Electronically Signed   By: Markus Daft M.D.   On: 03/04/2017 12:22   US Abdomen Limited Ruq  Result Date: 03/05/2017 CLINICAL DATA:  Alcoholic cirrhosis.  Portal hypertension. EXAM: US ABDOMEN LIMITED - RIGHT UPPER QUADRANT COMPARISON:  Ultrasound dated 05/24/2015 FINDINGS: Gallbladder: The gallbladder is contracted. No gallstones or wall thickening visualized. No sonographic Murphy sign noted by sonographer. Common bile duct: Diameter: 3.8 mm, normal. Liver: Diffuse increased echogenicity with slight nodularity of the liver contour consistent with cirrhosis. Diffuse ascites. No focal lesions. IMPRESSION: No acute  abnormalities.  Cirrhosis with ascites.  No focal lesions. Electronically Signed   By: Lorriane Shire M.D.   On: 03/05/2017 13:28   US Thoracentesis Asp Pleural Space W/img Guide  Result Date: 03/05/2017 INDICATION: Patient with with history of cirrhosis and recurrent ascites now with right pleural effusion. Request is made for diagnostic and therapeutic right thoracentesis. EXAM: ULTRASOUND GUIDED DIAGNOSTIC AND THERAPEUTIC RIGHT THORACENTESIS MEDICATIONS: 10 mL 1% lidocaine COMPLICATIONS: None immediate. PROCEDURE: An ultrasound guided thoracentesis was thoroughly discussed with the patient and questions answered. The benefits, risks, alternatives and complications were also discussed. The patient understands and wishes to proceed with the procedure. Written consent was obtained. Ultrasound was performed to localize and [place] an adequate pocket of fluid in the right chest. The area was then prepped and draped in the normal sterile fashion. 1% Lidocaine was used for local anesthesia. Under ultrasound guidance a Safe-T-Centesis catheter was introduced. Thoracentesis was performed. The catheter was removed and a dressing applied. FINDINGS: A total of approximately 750 mL of blood-tinged fluid was removed. Samples were sent to the laboratory as requested by the clinical team. IMPRESSION: Successful ultrasound guided diagnostic and therapeutic right thoracentesis yielding 750 mL of pleural fluid. Read by:  Brynda Greathouse PA-C Electronically Signed   By: Sandi Mariscal  M.D.   On: 03/05/2017 13:30    Microbiology: Recent Results (from the past 240 hour(s))  Culture, body fluid-bottle     Status: None (Preliminary result)   Collection Time: 03/04/17 12:34 PM  Result Value Ref Range Status   Specimen Description PERITONEAL  Final   Special Requests NONE  Final   Culture   Final    NO GROWTH 2 DAYS Performed at [facility], [address] [place], [place] [address]    Report Status PENDING  Incomplete   Gram stain     Status: None   Collection Time: 03/04/17 12:34 PM  Result Value Ref Range Status   Specimen Description PERITONEAL  Final   Special Requests NONE  Final   Gram Stain   Final    WBC PRESENT, PREDOMINANTLY MONONUCLEAR WBC PRESENT, PREDOMINANTLY PMN NO ORGANISMS SEEN CYTOSPIN SMEAR Performed at [facility], [place] [address] [place], [place] [address]    Report Status 03/05/2017 FINAL  Final  Body fluid culture     Status: None (Preliminary result)   Collection Time: 03/05/17  1:10 PM  Result Value Ref Range Status   Specimen Description PLEURAL RIGHT  Final   Special Requests NONE  Final   Gram Stain   Final    MODERATE WBC PRESENT, PREDOMINANTLY MONONUCLEAR NO ORGANISMS SEEN    Culture   Final    NO GROWTH < 24 HOURS Performed at [facility], [place] [address], [place], [place] [address]    Report Status PENDING  Incomplete     Labs: Basic Metabolic Panel:  Recent Labs Lab 03/03/17 1016 03/04/17 0342 03/05/17 0426 03/06/17 0413  NA 140 141 139 136  K 2.9* 3.2* 3.2* 3.9  CL 98* 100* 100* 99*  CO2 32 34* 33* 33*  GLUCOSE 171* 97 98 98  BUN 18 17 12 12   CREATININE 1.28* 1.39* 1.19 1.31*  CALCIUM 8.9 8.8* 8.8* 8.4*  MG 0.9* 1.2* 1.5* 2.2   Liver Function Tests:  Recent Labs Lab 03/03/17 1016 03/04/17 0342 03/05/17 0426 03/06/17 0413  AST 91* 82* 78* 81*  ALT 33 32 28 30  ALKPHOS 118 113 109 109  BILITOT 9.0* 9.6* 8.9* 8.6*  PROT 6.2* 5.9* 6.0* 5.8*  ALBUMIN 2.1* 2.2* 2.4* 2.3*   No results for input(s): LIPASE, AMYLASE in the last 168 hours. No results for input(s): AMMONIA in the last 168 hours. CBC:  Recent Labs Lab 03/03/17 1016 03/04/17 0342 03/05/17 0426 03/06/17 0413  WBC 7.6 8.4 7.0 8.4  NEUTROABS 5.2  --  4.4  --   HGB 7.0* 7.7* 7.7* 7.3*  HCT 20.7* 22.6* 22.5* 21.4*  MCV 124.7* 115.9* 116.6* 118.9*  PLT 47* 44* 38* 44*   Cardiac Enzymes:  Recent Labs Lab 03/03/17 1016  TROPONINI <0.03   BNP: BNP  (last 3 results) No results for input(s): BNP in the last 8760 hours.  ProBNP (last 3 results) No results for input(s): PROBNP in the last 8760 hours.  CBG: No results for input(s): GLUCAP in the last 168 hours.     SignedIrine Seal MD.  [facility] 03/06/2017, 6:51 PM

## 2017-03-05 NOTE — Progress Notes (Signed)
Patient was in the process of being discharged when he told nursing that he didn't feel too well and was lightheaded and c/o of feeling full/bloated. BP 137/121. Repeat manual BP 68/50. D/C discharge order and diuretics. Give NS bolus 500cc x 1. Simethicone.

## 2017-03-06 LAB — CBC
HEMATOCRIT: 21.4 % — AB (ref 39.0–52.0)
HEMOGLOBIN: 7.3 g/dL — AB (ref 13.0–17.0)
MCH: 40.6 pg — ABNORMAL HIGH (ref 26.0–34.0)
MCHC: 34.1 g/dL (ref 30.0–36.0)
MCV: 118.9 fL — ABNORMAL HIGH (ref 78.0–100.0)
Platelets: 44 10*3/uL — ABNORMAL LOW (ref 150–400)
RBC: 1.8 MIL/uL — ABNORMAL LOW (ref 4.22–5.81)
RDW: 26.9 % — ABNORMAL HIGH (ref 11.5–15.5)
WBC: 8.4 10*3/uL (ref 4.0–10.5)

## 2017-03-06 LAB — COMPREHENSIVE METABOLIC PANEL
ALBUMIN: 2.3 g/dL — AB (ref 3.5–5.0)
ALT: 30 U/L (ref 17–63)
AST: 81 U/L — AB (ref 15–41)
Alkaline Phosphatase: 109 U/L (ref 38–126)
Anion gap: 4 — ABNORMAL LOW (ref 5–15)
BILIRUBIN TOTAL: 8.6 mg/dL — AB (ref 0.3–1.2)
BUN: 12 mg/dL (ref 6–20)
CHLORIDE: 99 mmol/L — AB (ref 101–111)
CO2: 33 mmol/L — ABNORMAL HIGH (ref 22–32)
Calcium: 8.4 mg/dL — ABNORMAL LOW (ref 8.9–10.3)
Creatinine, Ser: 1.31 mg/dL — ABNORMAL HIGH (ref 0.61–1.24)
GFR calc Af Amer: >60 mL/min (ref >60)
Glucose, Bld: 98 mg/dL (ref 65–99)
POTASSIUM: 3.9 mmol/L (ref 3.5–5.1)
Sodium: 136 mmol/L (ref 135–145)
TOTAL PROTEIN: 5.8 g/dL — AB (ref 6.5–8.1)

## 2017-03-06 LAB — MAGNESIUM: MAGNESIUM: 2.2 mg/dL (ref 1.7–2.4)

## 2017-03-06 LAB — LACTATE DEHYDROGENASE: LDH: 449 U/L — AB (ref 98–192)

## 2017-03-06 LAB — AFP TUMOR MARKER: AFP-Tumor Marker: 16.7 ng/mL — ABNORMAL HIGH (ref 0.0–8.3)

## 2017-03-06 MED ORDER — SPIRONOLACTONE 100 MG PO TABS: 50.0000 mg | ORAL_TABLET | Freq: Every day | ORAL | 0 refills | Status: DC

## 2017-03-06 MED ORDER — FUROSEMIDE 40 MG PO TABS: 40.0000 mg | ORAL_TABLET | Freq: Every day | ORAL | 0 refills | Status: DC

## 2017-03-06 MED ORDER — PANTOPRAZOLE SODIUM 40 MG PO TBEC
40.0000 mg | DELAYED_RELEASE_TABLET | Freq: Two times a day (BID) | ORAL | Status: DC
  Administered 2017-03-06 (×2): 40 mg via ORAL
  Filled 2017-03-06 (×2): qty 1

## 2017-03-06 MED ORDER — SODIUM CHLORIDE 0.9 % IV SOLN
INTRAVENOUS | Status: DC
  Administered 2017-03-06: 17:00:00 via INTRAVENOUS

## 2017-03-06 NOTE — Progress Notes (Signed)
PROGRESS NOTE    Thomas [name]  HEN:[id number] DOB: 11/13/1961 DOA: 03/03/2017 PCP: [name], MD    Brief Narrative:   Thomas [name] is a 56 y.o. 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  Patient tells me that over the last week he has been progressively weak, short of breath, and barely able to do anything.  He is also complaining of abdominal distention, as well as early satiety and abdominal discomfort after he eats anything, and thus he has been having little to no p.o. intake over the last several days.  He denies any fevers however has had intermittent chills at home.  He denies any chest pain.  He denies any vomiting.  He has not seen any blood in his stool however has noticed dark stools, however he takes a lot of Pepto-Bismol and thinks it is related to that.  He reports compliance with his home medications.  ED Course: In the emergency room his vital signs are stable, he is afebrile, blood work shows mild creatinine elevation to 1.3, hypokalemia with a potassium of 2.9, hemoglobin of 7.0 and platelets of 47.  Chest x-ray shows moderate right-sided pleural effusion.  Discussed with EDP, he did a bedside ultrasound of his abdomen and showed moderate ascites not enough for him to safely perform a paracentesis at bedside.  TRH is asked for admission for anemia and dyspnea.   Assessment & Plan:   Principal Problem:   Dyspnea Active  Problems:   Alcoholic cirrhosis of liver with ascites (HCC)   Pleural effusion associated with hepatic disorder   Coagulopathy (HCC)   Alcoholic cirrhosis (HCC)   Acute kidney injury (HCC)   Thrombocytopenia (HCC)   Esophageal varices (HCC)   Acute blood loss anemia   End stage liver disease (HCC)   Hypokalemia   Hypomagnesemia   Symptomatic anemia   S/P thoracentesis  #1 dyspnea Likely multifactorial secondary to hepatic hydrothorax and abdominal ascites. Patient had gone 9 months since last thoracentesis and paracentesis. Chest x-ray with increasing right pleural effusion likely hepatic hydrothorax. Patient with good sats on room air. Patient s/p thoracentesis and paracentesis. Patient back to baseline. Follow.  #2 decompensated end-stage liver disease/alcoholic cirrhosis Patient with acute decompensation with evidence of ascites and hepatic hydrothorax. MELD score 29. Patient for paracentesis with albumin today and likely thoracentesis tomorrow. Patient with a prior history of SBP and currently on ciprofloxacin for SBP prophylaxis. Cultures from paracentesis and thoracentesis will be obtained. Patient with prior history of hepatic encephalopathy however currently stable. Patient with no asterixis. Continue lactulose. Continue nadolol. Patient also noted to have a prior history of esophageal varices. Patient with no overt bleeding however FOBT is positive. GI has assessed the patient and no need for any further workup. Patient was to be discharged on 03/05/2017 after patient felt lightheaded and noted to be hypotensive and a such patient's diuretics were held. Patient be discharged on half home dose of diuretics to start tomorrow. Patient will follow-up with GI in the outpatient setting  in 1 week.   #3 right pleural effusion likely hepatic hydrothorax Patient s/p thoracentesis 03/05/2017. Clinical improvement. Monitor.  #4 thrombocytopenia/coagulopathy The setting of liver disease. Stable.  Follow.  #5 anemia Patient noted to have a positive FOBT. Patient with no overt bleeding. Patient with history of esophageal varices. Hemoglobin currently at 7.3 from 7.7 from 7.0 on admission. Follow H&H. Continue PPI. Continue nadolol. GI ff with no planned interventions at this time.  #6 hypokalemia/hypomagnesemia Likely secondary to poor oral intake in the setting of diuretics. Repleted.  #7 mild elevation in creatinine Hold diuretics.  #8 hypotension Patient was to be discharged 03/05/2017 however just prior to discharge patient was complaining of dizziness and lightheadedness and noted to have systolic blood pressures in the 60s. Patient was given a bolus of IV fluids and hydrated with IV fluids of normal saline at 75 mL per hour. Felt likely secondary to patient's diuretics. Patient's diuretics were subsequently held. Patient be discharged home on half his home regimen of Lasix and spironolactone.   DVT prophylaxis: SCDs Code Status: Full Family Communication: Updated patient and family at bedside. Disposition Plan: Discharge home with close outpatient follow-up with GI   Consultants:   Gastroenterology. Dr. Henrene Pastor 03/04/2017  Procedures:   Paracentesis pending 03/04/2017  Thoracentesis pending  03/05/2017  Chest x-ray 03/03/2017  1 unit packed red blood cells transfusion 03/03/2017  Abdominal ultrasound with Dopplers 03/05/2017  Antimicrobials:   Oral ciprofloxacin prophylaxis 03/03/2017   Subjective: Patient states some improvement with shortness of breath since admission however not at baseline. Patient states abdominal distention has improved. No chest pain. No shortness of breath. No abdominal pain. No further lightheadedness or dizziness. Patient states he feels well this afternoon.   Objective: Vitals:   03/05/17 2106 03/06/17 0523 03/06/17 0945 03/06/17 1419  BP: (!) 84/57 100/60 (!) 98/56 120/61  Pulse: 64 67 68 67  Resp: 18 18  18   Temp: 98.2 F  (36.8 C) 98.3 F (36.8 C)  98.6 F (37 C)  TempSrc: Oral Oral  Oral  SpO2: 96% 90%  95%  Weight:      Height:        Intake/Output Summary (Last 24 hours) at 03/06/17 1840 Last data filed at 03/06/17 1300  Gross per 24 hour  Intake              840 ml  Output                0 ml  Net              840 ml   Filed Weights   03/03/17 1539 03/03/17 1615  Weight: 81.6 kg (180 lb) 83.8 kg (184 lb 12.8 oz)    Examination:  General exam: Appears calm and comfortable.Sclera icterus. Respiratory system: CTAB. Respiratory effort normal. Cardiovascular system: S1 & S2 heard, RRR. No JVD, murmurs, rubs, gallops or clicks. No pedal edema. Gastrointestinal system: Abdomen is distended, soft and nontender. No organomegaly or masses felt. Normal bowel sounds heard. Central nervous system: Alert and oriented. No focal neurological deficits. Extremities: Symmetric 5 x 5 power. Skin: No rashes, lesions or ulcers. Slightly jaundiced. Psychiatry: Judgement and insight appear normal. Mood & affect appropriate.     Data Reviewed: I have personally reviewed following labs and imaging studies  CBC:  Recent Labs Lab 03/03/17 1016 03/04/17 0342 03/05/17 0426 03/06/17 0413  WBC 7.6 8.4 7.0 8.4  NEUTROABS 5.2  --  4.4  --   HGB 7.0* 7.7*  7.7* 7.3*  HCT 20.7* 22.6* 22.5* 21.4*  MCV 124.7* 115.9* 116.6* 118.9*  PLT 47* 44* 38* 44*   Basic Metabolic Panel:  Recent Labs Lab 03/03/17 1016 03/04/17 0342 03/05/17 0426 03/06/17 0413  NA 140 141 139 136  K 2.9* 3.2* 3.2* 3.9  CL 98* 100* 100* 99*  CO2 32 34* 33* 33*  GLUCOSE 171* 97 98 98  BUN 18 17 12 12   CREATININE 1.28* 1.39* 1.19 1.31*  CALCIUM 8.9 8.8* 8.8* 8.4*  MG 0.9* 1.2* 1.5* 2.2   GFR: Estimated Creatinine Clearance: 64.2 mL/min (A) (by C-G formula based on SCr of 1.31 mg/dL (H)). Liver Function Tests:  Recent Labs Lab 03/03/17 1016 03/04/17 0342 03/05/17 0426 03/06/17 0413  AST 91* 82* 78* 81*  ALT 33 32 28 30   ALKPHOS 118 113 109 109  BILITOT 9.0* 9.6* 8.9* 8.6*  PROT 6.2* 5.9* 6.0* 5.8*  ALBUMIN 2.1* 2.2* 2.4* 2.3*   No results for input(s): LIPASE, AMYLASE in the last 168 hours. No results for input(s): AMMONIA in the last 168 hours. Coagulation Profile:  Recent Labs Lab 03/03/17 1016 03/05/17 0426  INR 2.94 2.99   Cardiac Enzymes:  Recent Labs Lab 03/03/17 1016  TROPONINI <0.03   BNP (last 3 results) No results for input(s): PROBNP in the last 8760 hours. HbA1C: No results for input(s): HGBA1C in the last 72 hours. CBG: No results for input(s): GLUCAP in the last 168 hours. Lipid Profile: No results for input(s): CHOL, HDL, LDLCALC, TRIG, CHOLHDL, LDLDIRECT in the last 72 hours. Thyroid Function Tests: No results for input(s): TSH, T4TOTAL, FREET4, T3FREE, THYROIDAB in the last 72 hours. Anemia Panel: No results for input(s): VITAMINB12, FOLATE, FERRITIN, TIBC, IRON, RETICCTPCT in the last 72 hours. Sepsis Labs: No results for input(s): PROCALCITON, LATICACIDVEN in the last 168 hours.  Recent Results (from the past 240 hour(s))  Culture, body fluid-bottle     Status: None (Preliminary result)   Collection Time: 03/04/17 12:34 PM  Result Value Ref Range Status   Specimen Description PERITONEAL  Final   Special Requests NONE  Final   Culture   Final    NO GROWTH 2 DAYS Performed at [facility], [address], [place], [place] [address]    Report Status PENDING  Incomplete  Gram stain     Status: None   Collection Time: 03/04/17 12:34 PM  Result Value Ref Range Status   Specimen Description PERITONEAL  Final   Special Requests NONE  Final   Gram Stain   Final    WBC PRESENT, PREDOMINANTLY MONONUCLEAR WBC PRESENT, PREDOMINANTLY PMN NO ORGANISMS SEEN CYTOSPIN SMEAR Performed at [facility], [place] [address], [place], [place] [address]    Report Status 03/05/2017 FINAL  Final  Body fluid culture     Status: None (Preliminary result)   Collection  Time: 03/05/17  1:10 PM  Result Value Ref Range Status   Specimen Description PLEURAL RIGHT  Final   Special Requests NONE  Final   Gram Stain   Final    MODERATE WBC PRESENT, PREDOMINANTLY MONONUCLEAR NO ORGANISMS SEEN    Culture   Final    NO GROWTH < 24 HOURS Performed at [facility], [place] [address], [place], [place] [address]    Report Status PENDING  Incomplete         Radiology Studies: Dg Chest 1 View  Result Date: 03/05/2017 CLINICAL DATA:  Right pleural effusion.  Status post thoracentesis.  EXAM: CHEST 1 VIEW COMPARISON:  03/03/2017 and 12/21/2016 FINDINGS: There is only a tiny residual right pleural effusion. No pneumothorax. Heart size and pulmonary vascularity are normal. Lungs are clear. Bones appear normal. IMPRESSION: Tiny residual right pleural effusion.  No pneumothorax. Electronically Signed   By: Lorriane Shire M.D.   On: 03/05/2017 13:43   [place] Art/ven Flow Abd Pelv Doppler  Result Date: 03/05/2017 CLINICAL DATA:  Cirrhosis, alcoholic. Screening for hepatoma. Doppler study recommended by transplant center. EXAM: DUPLEX ULTRASOUND OF LIVER TECHNIQUE: Color and duplex Doppler ultrasound was performed to evaluate the hepatic in-flow and out-flow vessels. COMPARISON:  03/07/2016 FINDINGS: Portal Vein 0.9 cm diameter, without evidence of occlusion or thrombus. Velocities (All hepatopetal): Main:  17-26 cm/sec Right:  21 cm/sec Left:  16 cm/sec Hepatic Vein Velocities (all hepatofugal): Right:  39 cm/sec Middle:  22 cm/sec Left:  22 cm/sec Hepatic Artery Velocity:  144 cm/sec Spleen 11.4 x 13.7 x 5.8 cm (volume = 470 cm^3). Splenic Vein without evidence of occlusion or thrombus, Velocity: 20.8 cm/sec Varices: None visualized Ascites: Present, moderate IMPRESSION: 1. Unremarkable hepatic vascular Doppler evaluation. 2. Splenomegaly and ascites suggesting a degree of portal venous hypertension. Electronically Signed   By: Lucrezia Europe M.D.   On: 03/05/2017 15:49   US  Abdomen Limited Ruq  Result Date: 03/05/2017 CLINICAL DATA:  Alcoholic cirrhosis.  Portal hypertension. EXAM: US ABDOMEN LIMITED - RIGHT UPPER QUADRANT COMPARISON:  Ultrasound dated 05/24/2015 FINDINGS: Gallbladder: The gallbladder is contracted. No gallstones or wall thickening visualized. No sonographic Murphy sign noted by sonographer. Common bile duct: Diameter: 3.8 mm, normal. Liver: Diffuse increased echogenicity with slight nodularity of the liver contour consistent with cirrhosis. Diffuse ascites. No focal lesions. IMPRESSION: No acute abnormalities.  Cirrhosis with ascites.  No focal lesions. Electronically Signed   By: Lorriane Shire M.D.   On: 03/05/2017 13:28   US Thoracentesis Asp Pleural Space W/img Guide  Result Date: 03/05/2017 INDICATION: Patient with with history of cirrhosis and recurrent ascites now with right pleural effusion. Request is made for diagnostic and therapeutic right thoracentesis. EXAM: ULTRASOUND GUIDED DIAGNOSTIC AND THERAPEUTIC RIGHT THORACENTESIS MEDICATIONS: 10 mL 1% lidocaine COMPLICATIONS: None immediate. PROCEDURE: An ultrasound guided thoracentesis was thoroughly discussed with the patient and questions answered. The benefits, risks, alternatives and complications were also discussed. The patient understands and wishes to proceed with the procedure. Written consent was obtained. Ultrasound was performed to localize and [place] an adequate pocket of fluid in the right chest. The area was then prepped and draped in the normal sterile fashion. 1% Lidocaine was used for local anesthesia. Under ultrasound guidance a Safe-T-Centesis catheter was introduced. Thoracentesis was performed. The catheter was removed and a dressing applied. FINDINGS: A total of approximately 750 mL of blood-tinged fluid was removed. Samples were sent to the laboratory as requested by the clinical team. IMPRESSION: Successful ultrasound guided diagnostic and therapeutic right thoracentesis yielding  750 mL of pleural fluid. Read by:  Brynda Greathouse PA-C Electronically Signed   By: Sandi Mariscal M.D.   On: 03/05/2017 13:30        Scheduled Meds: . ciprofloxacin  500 mg Oral QHS  . lactulose  30 g Oral BID  . nadolol  40 mg Oral Daily  . pantoprazole  40 mg Oral BID AC  . simethicone  160 mg Oral QID   Continuous Infusions: . sodium chloride 75 mL/hr at 03/06/17 1642     LOS: 2 days    Time spent: 35 minutes  [facility], MD [facility] Pager [phone]  If 7PM-7AM, please contact night-coverage [email or web address] Password [facility] 03/06/2017, 6:40 PM

## 2017-03-07 LAB — PATHOLOGIST SMEAR REVIEW

## 2017-03-09 LAB — CULTURE, BODY FLUID-BOTTLE: CULTURE: NO GROWTH

## 2017-03-09 LAB — BODY FLUID CULTURE: CULTURE: NO GROWTH

## 2017-03-09 LAB — CULTURE, BODY FLUID W GRAM STAIN -BOTTLE

## 2017-03-21 ENCOUNTER — Other Ambulatory Visit (INDEPENDENT_AMBULATORY_CARE_PROVIDER_SITE_OTHER): Payer: BLUE CROSS/BLUE SHIELD

## 2017-03-21 DIAGNOSIS — N179 Acute kidney failure, unspecified: Secondary | ICD-10-CM | POA: Diagnosis not present

## 2017-03-21 LAB — BASIC METABOLIC PANEL
BUN: 21 mg/dL (ref 6–23)
CHLORIDE: 100 meq/L (ref 96–112)
CO2: 25 meq/L (ref 19–32)
CREATININE: 2.47 mg/dL — AB (ref 0.40–1.50)
Calcium: 8.1 mg/dL — ABNORMAL LOW (ref 8.4–10.5)
GFR: 29.06 mL/min — ABNORMAL LOW (ref >60.00)
Glucose, Bld: 137 mg/dL — ABNORMAL HIGH (ref 70–99)
Potassium: 4.7 mEq/L (ref 3.5–5.1)
Sodium: 134 mEq/L — ABNORMAL LOW (ref 135–145)

## 2017-03-23 ENCOUNTER — Ambulatory Visit: Payer: BLUE CROSS/BLUE SHIELD | Admitting: Nurse Practitioner

## 2017-03-26 ENCOUNTER — Telehealth: Payer: Self-pay

## 2017-03-26 ENCOUNTER — Other Ambulatory Visit: Payer: Self-pay

## 2017-03-26 DIAGNOSIS — K7030 Alcoholic cirrhosis of liver without ascites: Secondary | ICD-10-CM
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
## 2017-03-26 NOTE — Telephone Encounter (Signed)
-----   Message from [name], MD sent at 03/26/2017 11:58 AM EDT ----- Agree with Paula's recs He needs to be seen ASAP with APP here if unavailable PCP

## 2017-03-27 ENCOUNTER — Inpatient Hospital Stay (HOSPITAL_COMMUNITY)
Admission: EM | Admit: 2017-03-27 | Discharge: 2017-03-30 | DRG: 433 | Disposition: A | Discharge status: Home / Self Care | Payer: BLUE CROSS/BLUE SHIELD | Attending: Internal Medicine | Admitting: Internal Medicine

## 2017-03-27 ENCOUNTER — Emergency Department (HOSPITAL_COMMUNITY): Payer: BLUE CROSS/BLUE SHIELD

## 2017-03-27 ENCOUNTER — Other Ambulatory Visit (INDEPENDENT_AMBULATORY_CARE_PROVIDER_SITE_OTHER): Payer: BLUE CROSS/BLUE SHIELD

## 2017-03-27 ENCOUNTER — Encounter (HOSPITAL_COMMUNITY): Payer: Self-pay | Admitting: Internal Medicine

## 2017-03-27 DIAGNOSIS — K7030 Alcoholic cirrhosis of liver without ascites: Secondary | ICD-10-CM | POA: Diagnosis not present

## 2017-03-27 DIAGNOSIS — R0602 Shortness of breath: Secondary | ICD-10-CM | POA: Diagnosis not present

## 2017-03-27 DIAGNOSIS — E8770 Fluid overload, unspecified: Secondary | ICD-10-CM | POA: Diagnosis present

## 2017-03-27 DIAGNOSIS — I8500 Esophageal varices without bleeding: Secondary | ICD-10-CM | POA: Diagnosis present

## 2017-03-27 DIAGNOSIS — D696 Thrombocytopenia, unspecified: Secondary | ICD-10-CM | POA: Diagnosis present

## 2017-03-27 DIAGNOSIS — J189 Pneumonia, unspecified organism: Secondary | ICD-10-CM | POA: Diagnosis not present

## 2017-03-27 DIAGNOSIS — D689 Coagulation defect, unspecified: Secondary | ICD-10-CM | POA: Diagnosis not present

## 2017-03-27 DIAGNOSIS — E785 Hyperlipidemia, unspecified: Secondary | ICD-10-CM | POA: Diagnosis present

## 2017-03-27 DIAGNOSIS — K7290 Hepatic failure, unspecified without coma: Secondary | ICD-10-CM | POA: Diagnosis not present

## 2017-03-27 DIAGNOSIS — K3189 Other diseases of stomach and duodenum: Secondary | ICD-10-CM | POA: Diagnosis present

## 2017-03-27 DIAGNOSIS — R6881 Early satiety: Secondary | ICD-10-CM | POA: Diagnosis present

## 2017-03-27 DIAGNOSIS — Y907 Blood alcohol level of 200-239 mg/100 ml: Secondary | ICD-10-CM | POA: Diagnosis present

## 2017-03-27 DIAGNOSIS — F1010 Alcohol abuse, uncomplicated: Secondary | ICD-10-CM | POA: Diagnosis present

## 2017-03-27 DIAGNOSIS — K766 Portal hypertension: Secondary | ICD-10-CM | POA: Diagnosis present

## 2017-03-27 DIAGNOSIS — K922 Gastrointestinal hemorrhage, unspecified: Secondary | ICD-10-CM | POA: Diagnosis not present

## 2017-03-27 DIAGNOSIS — R609 Edema, unspecified: Secondary | ICD-10-CM | POA: Diagnosis present

## 2017-03-27 DIAGNOSIS — R05 Cough: Secondary | ICD-10-CM | POA: Diagnosis not present

## 2017-03-27 DIAGNOSIS — I10 Essential (primary) hypertension: Secondary | ICD-10-CM | POA: Diagnosis not present

## 2017-03-27 DIAGNOSIS — E876 Hypokalemia: Secondary | ICD-10-CM | POA: Diagnosis not present

## 2017-03-27 DIAGNOSIS — E8809 Other disorders of plasma-protein metabolism, not elsewhere classified: Secondary | ICD-10-CM | POA: Diagnosis not present

## 2017-03-27 DIAGNOSIS — K7031 Alcoholic cirrhosis of liver with ascites: Secondary | ICD-10-CM | POA: Diagnosis present

## 2017-03-27 DIAGNOSIS — D649 Anemia, unspecified: Secondary | ICD-10-CM | POA: Diagnosis not present

## 2017-03-27 DIAGNOSIS — K7040 Alcoholic hepatic failure without coma: Principal | ICD-10-CM | POA: Diagnosis present

## 2017-03-27 DIAGNOSIS — N179 Acute kidney failure, unspecified: Secondary | ICD-10-CM | POA: Diagnosis not present

## 2017-03-27 DIAGNOSIS — K219 Gastro-esophageal reflux disease without esophagitis: Secondary | ICD-10-CM | POA: Diagnosis present

## 2017-03-27 DIAGNOSIS — Z79899 Other long term (current) drug therapy: Secondary | ICD-10-CM

## 2017-03-27 DIAGNOSIS — R739 Hyperglycemia, unspecified: Secondary | ICD-10-CM | POA: Diagnosis present

## 2017-03-27 DIAGNOSIS — K7210 Chronic hepatic failure without coma: Secondary | ICD-10-CM | POA: Diagnosis present

## 2017-03-27 DIAGNOSIS — Z888 Allergy status to other drugs, medicaments and biological substances status: Secondary | ICD-10-CM

## 2017-03-27 DIAGNOSIS — J90 Pleural effusion, not elsewhere classified: Secondary | ICD-10-CM | POA: Diagnosis present

## 2017-03-27 DIAGNOSIS — G9340 Encephalopathy, unspecified: Secondary | ICD-10-CM | POA: Diagnosis not present

## 2017-03-27 DIAGNOSIS — D62 Acute posthemorrhagic anemia: Secondary | ICD-10-CM | POA: Diagnosis not present

## 2017-03-27 DIAGNOSIS — N19 Unspecified kidney failure: Secondary | ICD-10-CM

## 2017-03-27 LAB — CBC WITH DIFFERENTIAL/PLATELET
BASOS PCT: 1.1 % (ref 0.0–3.0)
Basophils Absolute: 0.1 10*3/uL (ref 0.0–0.1)
EOS ABS: 0.3 10*3/uL (ref 0.0–0.7)
EOS PCT: 3 % (ref 0.0–5.0)
Lymphocytes Relative: 17 % (ref 12.0–46.0)
Lymphs Abs: 1.6 10*3/uL (ref 0.7–4.0)
MCHC: 34 g/dL (ref 30.0–36.0)
MCV: 118.3 fl — ABNORMAL HIGH (ref 78.0–100.0)
Monocytes Absolute: 2.2 10*3/uL — ABNORMAL HIGH (ref 0.1–1.0)
Monocytes Relative: 23.2 % — ABNORMAL HIGH (ref 3.0–12.0)
Neutro Abs: 5.4 10*3/uL (ref 1.4–7.7)
Neutrophils Relative %: 55.7 % (ref 43.0–77.0)
Platelets: 121 10*3/uL — ABNORMAL LOW (ref 150.0–400.0)
RBC: 1.55 Mil/uL — ABNORMAL LOW (ref 4.22–5.81)
RDW: 19.1 % — AB (ref 11.5–15.5)
WBC: 9.6 10*3/uL (ref 4.0–10.5)

## 2017-03-27 LAB — PROTIME-INR
INR: 2.7 ratio — ABNORMAL HIGH (ref 0.8–1.0)
INR: 2.58
Prothrombin Time: 28.7 s — ABNORMAL HIGH (ref 9.6–13.1)
Prothrombin Time: 28.2 seconds — ABNORMAL HIGH (ref 11.4–15.2)

## 2017-03-27 LAB — HEPATIC FUNCTION PANEL
ALBUMIN: 2.2 g/dL — AB (ref 3.5–5.0)
ALK PHOS: 115 U/L (ref 38–126)
ALT: 29 U/L (ref 17–63)
AST: 70 U/L — AB (ref 15–41)
Bilirubin, Direct: 1.6 mg/dL — ABNORMAL HIGH (ref 0.1–0.5)
Indirect Bilirubin: 3.7 mg/dL — ABNORMAL HIGH (ref 0.3–0.9)
Total Bilirubin: 5.3 mg/dL — ABNORMAL HIGH (ref 0.3–1.2)
Total Protein: 5.9 g/dL — ABNORMAL LOW (ref 6.5–8.1)

## 2017-03-27 LAB — BRAIN NATRIURETIC PEPTIDE: B NATRIURETIC PEPTIDE 5: 106.7 pg/mL — AB (ref 0.0–100.0)

## 2017-03-27 LAB — BASIC METABOLIC PANEL
BUN: 23 mg/dL (ref 6–23)
CO2: 25 mEq/L (ref 19–32)
CREATININE: 2.42 mg/dL — AB (ref 0.40–1.50)
Calcium: 8.1 mg/dL — ABNORMAL LOW (ref 8.4–10.5)
Chloride: 104 mEq/L (ref 96–112)
GFR: 29.76 mL/min — AB (ref >60.00)
GLUCOSE: 130 mg/dL — AB (ref 70–99)
POTASSIUM: 4.3 meq/L (ref 3.5–5.1)
Sodium: 138 mEq/L (ref 135–145)

## 2017-03-27 LAB — PREPARE RBC (CROSSMATCH)

## 2017-03-27 LAB — POC OCCULT BLOOD, ED: FECAL OCCULT BLD: POSITIVE — AB

## 2017-03-27 LAB — TROPONIN I: Troponin I: <0.03 ng/mL (ref <0.03)

## 2017-03-27 LAB — APTT: APTT: 44 s — AB (ref 24–36)

## 2017-03-27 MED ORDER — SODIUM CHLORIDE 0.9 % IV SOLN
10.0000 mL/h | Freq: Once | INTRAVENOUS | Status: AC
  Administered 2017-03-27: 10 mL/h via INTRAVENOUS

## 2017-03-27 MED ORDER — SODIUM CHLORIDE 0.9 % IV SOLN
INTRAVENOUS | Status: DC
  Administered 2017-03-27 – 2017-03-28 (×2): via INTRAVENOUS

## 2017-03-27 NOTE — ED Triage Notes (Signed)
Pt sent in by [facility] after having his blood drawn today which showed low hgb and bad kidney function tests. Alert and oriented.

## 2017-03-27 NOTE — ED Provider Notes (Signed)
[facility] DEPT Provider Note   CSN: [id number] Arrival date & time: 03/27/17  1902     History   Chief Complaint No chief complaint on file.   HPI Marquinn Meschke is a 56 y.o. male.  56 y/o male w/ h/o etoh abuse, last use was 4 months ago, chirrohsis, espohageal varices with abnl labs Had labs done 1 week ago which showed and elevated creat. From baseline and a lower than nl hb He had them repeated again today and they remain abnl He denies abd pain, fever, vomiting blood, or dark/tarry stools He has had increased DOE but denies cough or congestion No chest pain No treatment used pta He was sent here for futher w/      Past Medical History:  Diagnosis Date  . Alcohol abuse 2013  . Alcoholic cirrhosis ([place]) 02/8181  . Anemia 02/2016   in setting of GI blood loss, but MCV macrocytic.   . Coagulopathy ([place]) 05/2015  . Diverticulosis 09/2015  . Esophageal varices ([place]) 09/2015   small  . GERD (gastroesophageal reflux disease)   . Hyperlipidemia   . Hypertension   . Portal hypertension ([place]) 09/2015   with portal gastropathy  . Thrombocytopenia ([place]) 02/2016  . Tubular adenoma of colon 09/2015    Patient Active Problem List   Diagnosis Date Noted  . S/P thoracentesis   . Symptomatic anemia   . Dyspnea 03/03/2017  . Hypomagnesemia 11/26/2016  . Acute respiratory failure with hypoxia ([place]) 11/24/2016  . Pleural effusion associated with hepatic disorder 11/24/2016  . Hypokalemia 11/24/2016  . Alcoholic cirrhosis of liver with ascites ([place])   . Bleeding gastrointestinal   . End stage liver disease ([place])   . Upper GI bleed 03/05/2016  . Coagulopathy ([place]) 03/05/2016  . Alcoholic cirrhosis ([place]) 99/37/1696  . Acute kidney injury ([place]) 03/05/2016  . Hepatic encephalopathy ([place]) 03/05/2016  . Macrocytic anemia 03/05/2016  . Thrombocytopenia ([place]) 03/05/2016  . Esophageal varices ([place]) 03/05/2016  . Acute blood loss anemia 03/05/2016  . Idiopathic esophageal  varices without bleeding ([place])   . Portal hypertensive gastropathy ([place])   . Encephalopathy, metabolic 78/93/8101    Past Surgical History:  Procedure Laterality Date  . ESOPHAGOGASTRODUODENOSCOPY N/A 03/05/2016   Procedure: ESOPHAGOGASTRODUODENOSCOPY (EGD);  Surgeon: Ladene Artist, MD;  Location: [name];  Service: Endoscopy;  Laterality: N/A;  . testicle prosthesis         Home Medications    Prior to Admission medications   Medication Sig Start Date End Date Taking? Authorizing Provider  bismuth subsalicylate (PEPTO BISMOL) 262 MG chewable tablet Chew 262-524 mg by mouth 3 (three) times daily as needed for indigestion.     Historical Provider, MD  calcium carbonate (TUMS EX) 750 MG chewable tablet Chew 1-2 tablets by mouth 3 (three) times daily as needed for heartburn.     Historical Provider, MD  ciprofloxacin (CIPRO) 500 MG tablet Take 500 mg by mouth at bedtime. 11/07/16   Historical Provider, MD  furosemide (LASIX) 40 MG tablet Take 1 tablet (40 mg total) by mouth daily. 03/07/17   Eugenie Filler, MD  lactulose (CHRONULAC) 10 GM/15ML solution Take 45 mLs (30 g total) by mouth 2 (two) times daily. 03/07/16   Maryann Mikhail, DO  nadolol (CORGARD) 40 MG tablet TAKE 1 TABLET (40 MG TOTAL) BY MOUTH DAILY. 04/03/16   [name], MD  pantoprazole (PROTONIX) 40 MG tablet TAKE 1 TABLET (40 MG TOTAL) BY MOUTH 2 (TWO) TIMES DAILY. Patient taking differently:  TAKE 1 TABLET (40 MG TOTAL) BY MOUTH once daily 03/10/16   [name], MD  spironolactone (ALDACTONE) 100 MG tablet Take 0.5 tablets (50 mg total) by mouth daily. 03/07/17   Eugenie Filler, MD    Family History Family History  Problem Relation Age of Onset  . Diabetes Father   . Parkinson's disease Father   . Diabetes Paternal Aunt   . Glaucoma Mother     Social History Social History  Substance Use Topics  . Smoking status: Never Smoker  . Smokeless tobacco: Never Used  . Alcohol use No     Comment: Quit  several months ago     Allergies   Rifaximin   Review of Systems Review of Systems  All other systems reviewed and are negative.    Physical Exam Updated Vital Signs BP (!) 123/42   Pulse 81   Temp 98.2 F (36.8 C) (Oral)   Resp 16   SpO2 97%   Physical Exam  Constitutional: He is oriented to person, place, and time. He appears well-developed and well-nourished.  Non-toxic appearance. No distress.  HENT:  Head: Normocephalic and atraumatic.  Eyes: Conjunctivae, EOM and lids are normal. Pupils are equal, round, and reactive to light. Scleral icterus is present.  Neck: Normal range of motion. Neck supple. No tracheal deviation present. No thyroid mass present.  Cardiovascular: Normal rate, regular rhythm and normal heart sounds.  Exam reveals no gallop.   No murmur heard. Pulmonary/Chest: Effort normal and breath sounds normal. No stridor. No respiratory distress. He has no decreased breath sounds. He has no wheezes. He has no rhonchi. He has no rales.  Abdominal: Soft. Normal appearance and bowel sounds are normal. He exhibits no distension. There is no tenderness. There is no rebound and no CVA tenderness.  Musculoskeletal: Normal range of motion. He exhibits no edema or tenderness.  Neurological: He is alert and oriented to person, place, and time. He has normal strength. No cranial nerve deficit or sensory deficit. GCS eye subscore is 4. GCS verbal subscore is 5. GCS motor subscore is 6.  Skin: Skin is warm and dry. No abrasion and no rash noted. There is pallor.  Psychiatric: He has a normal mood and affect. His speech is normal and behavior is normal.  Nursing note and vitals reviewed.    ED Treatments / Results  Labs (all labs ordered are listed, but only abnormal results are displayed) Labs Reviewed  HEPATIC FUNCTION PANEL  PROTIME-INR  APTT  POC OCCULT BLOOD, ED  TYPE AND SCREEN    EKG  EKG Interpretation None      Patient has a long-standing history  of guaiac-positive stools. However he denies any black or tarry stools. His hemoglobin was noted. He has complained of being weak. Will order blood transfusion for him. He is hemodynamically stable at this time. [facility] consult hospitalist for admission Radiology No results found.  Procedures Procedures (including critical care time)  Medications Ordered in ED Medications  0.9 %  sodium chloride infusion (not administered)     Initial Impression / Assessment and Plan / ED Course  I have reviewed the triage vital signs and the nursing notes.  Pertinent labs & imaging results that were available during my care of the patient were reviewed by me and considered in my medical decision making (see chart for details).       Final Clinical Impressions(s) / ED Diagnoses   Final diagnoses:  None    New Prescriptions  New Prescriptions   No medications on file     Lacretia Leigh, MD 03/27/17 2213

## 2017-03-27 NOTE — H&P (Signed)
History and Physical    Thomas [name] WER:[id number] DOB: June 03, 1961 DOA: 03/27/2017  PCP: [name], MD Consultants:  [facility] Patient coming from:  Home - lives with wife; NOK: wife, [phone]  Chief Complaint: abnormal labs  HPI: Thomas [name] is a 56 y.o. male with medical history significant of alcoholic cirrhosis (reports cessation of alcohol for the past year and currently trying to transfer care to [facility] for consideration of transplant after having been declined by [facility]), prior history of GI bleed,prior history of hepatic encephalopathy,prior history of spontaneous bacterial peritonitis, coagulopathy, as well as ascites and hepatic hydrothorax requiring paracentesis and thoracentesis in June of last year presenting after having been sent by [facility] for abnormal labs.  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  He went in last Wednesday for lab work before an appointment Friday at [place].  He had to cancel Friday because of work issues.  They called him yesterday and told him to have repeat lab work because his kidney test was high.  Before he even got home, they called and said his kidney test was abnormal and blood count was down and he needed to come to the ER for admission and they will see him here in consultation tomorrow.    Very fatigued, difficulty walking or doing anything, very SOB with exertion.  SOB has been worsening over the last week.  Had improved with medications, even able to walk a couple of miles but not since last hospital discharge.  Unable to get up the steps, has to hold on.  Difficulty getting in the car.  Legs are very swollen after they cut back his diuretics.    +cough, nonproductive but frequent (has allergies).  ?recurrence of ascites.  Mild early satiety.  He is inconsistent with lactulose dosing.  Voids  regularly and without difficulty.  Legs are weaker than usual.   ED Course:  Long-standing h/o heme positive stools, but patient denies black/tarry stools or BRBPR.  Hgb 6.2, transfusion ordered.  Review of Systems: As per HPI; otherwise review of systems reviewed and negative.   Ambulatory Status:  Ambulates without assistance  Past Medical History:  Diagnosis Date  . Alcohol abuse 2013  . Alcoholic cirrhosis ([place]) 01/4579  . Anemia 02/2016   in setting of GI blood loss, but MCV macrocytic.   . Coagulopathy ([place]) 05/2015  . Diverticulosis 09/2015  . Esophageal varices ([place]) 09/2015   small  . GERD (gastroesophageal reflux disease)   . Hyperlipidemia   . Hypertension   . Portal hypertension ([place]) 09/2015   with portal gastropathy  . Thrombocytopenia ([place]) 02/2016  . Tubular adenoma of colon 09/2015    Past Surgical History:  Procedure Laterality Date  . ESOPHAGOGASTRODUODENOSCOPY N/A 03/05/2016   Procedure: ESOPHAGOGASTRODUODENOSCOPY (EGD);  Surgeon: Ladene Artist, MD;  Location: [name];  Service: Endoscopy;  Laterality: N/A;  . testicle prosthesis      Social History   Social History  . Marital status: Married    Spouse name: Lelan Pons  . Number of children: 3  . Years of education: N/A   Occupational History  . Sales    Social History Main Topics  . Smoking status: Never Smoker  . Smokeless tobacco: Never Used  . Alcohol use No     Comment: Quit 2/17, relapse in 1/18 for short period of time  . Drug use:  No  . Sexual activity: Not on file   Other Topics Concern  . Not on file   Social History Narrative   Lives with wife.  Normally able to ambulate without assistance.    Allergies  Allergen Reactions  . Rifaximin Swelling and Other (See Comments)    Reaction:  All over body swelling     Family History  Problem Relation Age of Onset  . Diabetes Father   . Parkinson's disease Father   . Diabetes Paternal Aunt   . Glaucoma Mother     Prior to  Admission medications   Medication Sig Start Date End Date Taking? Authorizing Provider  bismuth subsalicylate (PEPTO BISMOL) 262 MG chewable tablet Chew 262-524 mg by mouth 3 (three) times daily as needed for indigestion or diarrhea or loose stools.    Yes Historical Provider, MD  calcium carbonate (TUMS EX) 750 MG chewable tablet Chew 1-2 tablets by mouth 3 (three) times daily as needed for heartburn.    Yes Historical Provider, MD  ciprofloxacin (CIPRO) 500 MG tablet Take 500 mg by mouth at bedtime.   Yes Historical Provider, MD  furosemide (LASIX) 40 MG tablet Take 1 tablet (40 mg total) by mouth daily. 03/07/17  Yes Eugenie Filler, MD  lactulose (CHRONULAC) 10 GM/15ML solution Take 45 mLs (30 g total) by mouth 2 (two) times daily. 03/07/16  Yes Maryann Mikhail, DO  nadolol (CORGARD) 40 MG tablet TAKE 1 TABLET (40 MG TOTAL) BY MOUTH DAILY. 04/03/16  Yes [name], MD  pantoprazole (PROTONIX) 40 MG tablet TAKE 1 TABLET (40 MG TOTAL) BY MOUTH 2 (TWO) TIMES DAILY. 03/10/16  Yes [name], MD  spironolactone (ALDACTONE) 100 MG tablet Take 0.5 tablets (50 mg total) by mouth daily. 03/07/17  Yes Eugenie Filler, MD    Physical Exam: Vitals:   03/27/17 2317 03/27/17 2318 03/27/17 2347 03/28/17 0008  BP:  117/63 (!) 118/56 (!) 124/55  Pulse:  77 80 76  Resp:  16 (!) 25 20  Temp:  98 F (36.7 C)  98.2 F (36.8 C)  TempSrc:  Oral  Oral  SpO2: 97% 97% 97% 99%     General: Appears calm and comfortable and is NAD Eyes:  PERRL, EOMI, normal lids, iris ENT:  grossly normal hearing, lips & tongue, mmm Neck:  no LAD, masses or thyromegaly Cardiovascular:  RRR, no m/r/g. Marked LE edema.  Respiratory:  CTA bilaterally, no w/r/r. Normal respiratory effort. Abdomen:  soft, nt, NABS; taut, distended, ?fluid wave Skin:  Stasis dermatitis on L lower legs Musculoskeletal:  grossly normal tone BUE/BLE, good ROM, no bony abnormality Psychiatric:  grossly normal mood and affect, speech fluent and  appropriate, AOx3 Neurologic:  CN 2-12 grossly intact, moves all extremities in coordinated fashion, sensation intact  Labs on Admission: I have personally reviewed following labs and imaging studies  CBC:  Recent Labs Lab 03/27/17 1625  WBC 9.6  NEUTROABS 5.4  HGB 6.2 Repeated and verified X2.*  HCT 18.3 Repeated and verified X2.*  MCV 118.3*  PLT 147.8*   Basic Metabolic Panel:  Recent Labs Lab 03/21/17 1619 03/27/17 1625  NA 134* 138  K 4.7 4.3  CL 100 104  CO2 25 25  GLUCOSE 137* 130*  BUN 21 23  CREATININE 2.47* 2.42*  CALCIUM 8.1* 8.1*   GFR: CrCl cannot be calculated (Unknown ideal weight.). Liver Function Tests:  Recent Labs Lab 03/27/17 2051  AST 70*  ALT 29  ALKPHOS 115  BILITOT  5.3*  PROT 5.9*  ALBUMIN 2.2*   No results for input(s): LIPASE, AMYLASE in the last 168 hours. No results for input(s): AMMONIA in the last 168 hours. Coagulation Profile:  Recent Labs Lab 03/27/17 1625 03/27/17 2051  INR 2.7* 2.58   Cardiac Enzymes:  Recent Labs Lab 03/27/17 2051  TROPONINI <0.03   BNP (last 3 results) No results for input(s): PROBNP in the last 8760 hours. HbA1C: No results for input(s): HGBA1C in the last 72 hours. CBG: No results for input(s): GLUCAP in the last 168 hours. Lipid Profile: No results for input(s): CHOL, HDL, LDLCALC, TRIG, CHOLHDL, LDLDIRECT in the last 72 hours. Thyroid Function Tests: No results for input(s): TSH, T4TOTAL, FREET4, T3FREE, THYROIDAB in the last 72 hours. Anemia Panel: No results for input(s): VITAMINB12, FOLATE, FERRITIN, TIBC, IRON, RETICCTPCT in the last 72 hours. Urine analysis:    Component Value Date/Time   COLORURINE AMBER (A) 03/03/2017 1843   APPEARANCEUR CLEAR 03/03/2017 1843   LABSPEC 1.014 03/03/2017 1843   PHURINE 5.0 03/03/2017 1843   GLUCOSEU NEGATIVE 03/03/2017 1843   HGBUR NEGATIVE 03/03/2017 1843   BILIRUBINUR NEGATIVE 03/03/2017 1843   KETONESUR NEGATIVE 03/03/2017 1843    PROTEINUR NEGATIVE 03/03/2017 1843   UROBILINOGEN 0.2 11/22/2012 0856   NITRITE NEGATIVE 03/03/2017 1843   LEUKOCYTESUR NEGATIVE 03/03/2017 1843    Creatinine Clearance: CrCl cannot be calculated (Unknown ideal weight.).  Sepsis Labs: @LABRCNTIP (procalcitonin:4,lacticidven:4) )No results found for this or any previous visit (from the past 240 hour(s)).   Radiological Exams on Admission: Dg Chest 2 View  Result Date: 03/27/2017 CLINICAL DATA:  Cough, sob, low hemoglobin, increased liver function at PCP today. Hx alcohol abuse, esophageal varices, GERD, htn, tubular adenoma of colon. Non smoker EXAM: CHEST  2 VIEW COMPARISON:  03/05/2017 FINDINGS: Elevation of the right hemidiaphragm. Infiltration or atelectasis present in the right lung base. This could indicate pneumonia in the appropriate clinical setting. Heart size and pulmonary vascularity are normal. Probable small right pleural effusion. No pneumothorax. Mediastinal contours appear intact. Degenerative changes in the spine. IMPRESSION: Infiltration or atelectasis in the right lung base with probable small pleural effusion. Elevation of right hemidiaphragm. This could indicate pneumonia in the appropriate clinical setting. Electronically Signed   By: Lucienne Capers M.D.   On: 03/27/2017 21:28    EKG: Independently reviewed.  NSR with rate 75; IVCD with no evidence of acute ischemia  Assessment/Plan Principal Problem:   Symptomatic anemia Active Problems:   Coagulopathy (HCC)   Thrombocytopenia (HCC)   Esophageal varices (HCC)   End stage liver disease (HCC)   Alcoholic cirrhosis of liver with ascites (HCC)   Acute renal failure (ARF) (HCC)   Hyperglycemia   Hypoalbuminemia   Edema   Symptomatic anemia -Patient with h/o known varices, heme positive stools in the ER; this is likely the source of his decreasing Hgb (Hgb 6.2; 7.3 on 3/27; 7.7 on 3/26) -He acknowledges marked weakness, DOE -Type and screen were done in ED. One  unit of blood was ordered by ED. Will actually increase this to 2 units. - will admit to Med Surg bed - GI sent the patient for admission and so likely knows that he is here but may need a reminder call in the AM to see the patient - NPO for possible EGD - Hold fluids due to volume overload in the setting of renal failure (see below) - Start IV pantoprazole 40 mg BID -Without frank hematemesis, will hold on Octreotide for now. - Zofran  IV for nausea - Avoid NSAIDs and SQ heparin - Maintain IV access (2 large bore IVs if possible). - Monitor closely and recheck post-transfusion cbc, transfuse as necessary. - IV Rocephin 1 g X 7 days for PPx ; hold home Cipro (he takes this for SBP prophylaxis) -Continue Nadolol - May consider Octreotide load and infusion if he develops hematemesis  End-stage liver disease -This is worsening -MELD/MELD-Na score of 32, mortality rate 52.6% -INR 2.58 -Bilirubin 5.3; 8.6 on 3/27 -AST 70/ALT 29 -Albumin 2.2 -Platelets 121; 44 on 3/27 -He does have apparent ascites; will request IR assistance for paracentesis - this may further help his dyspnea -No current concern for SBP/infection -No current indication of hepatic encephalopathy -GI to see in AM -Will order ETOH level and UDS  Sub-acute renal failure -Creatinine 2.42; 2/47 on 4/11, 1.31 on 3/27 -He appears to have anasarca -He has been taken off his diuretics or had them decreased, according to family report -Will start Lasix 20 mg IV q6h and continue PO Aldactone -Will check renal biopsy -BNP barely elevated above 100 and CXR negative for pulmonary edema so this is unlikely to be contributing to his dyspnea -He is at risk for developing hepatorenal syndrome -Based on GI input, he may benefit from transfer to [facility] for consideration of liver transplant  Hyperglycemia -Glucose 130 -May be stress response -Will follow with fasting AM labs   DVT prophylaxis:  SCDs Code Status:  Full - confirmed  with patient/family Family Communication: Wife present throughout evaluation  Disposition Plan:  Home once clinically improved Consults called: GI sent him for admission; they may still need to be called in the AM  Admission status: Admit - It is my clinical opinion that admission to INPATIENT is reasonable and necessary because this patient will require at least 2 midnights in the hospital to treat this condition based on the medical complexity of the problems presented.  Given the aforementioned information, the predictability of an adverse outcome is felt to be significant.    Karmen Bongo MD [facility]  If 7PM-7AM, please contact night-coverage [email or web address] Password [facility]  03/28/2017, 12:31 AM

## 2017-03-27 NOTE — ED Notes (Signed)
Called name, but no response from the waiting room.

## 2017-03-28 DIAGNOSIS — R609 Edema, unspecified: Secondary | ICD-10-CM

## 2017-03-28 DIAGNOSIS — R0602 Shortness of breath: Secondary | ICD-10-CM

## 2017-03-28 DIAGNOSIS — K7031 Alcoholic cirrhosis of liver with ascites: Secondary | ICD-10-CM

## 2017-03-28 DIAGNOSIS — E8809 Other disorders of plasma-protein metabolism, not elsewhere classified: Secondary | ICD-10-CM

## 2017-03-28 DIAGNOSIS — R739 Hyperglycemia, unspecified: Secondary | ICD-10-CM

## 2017-03-28 DIAGNOSIS — N179 Acute kidney failure, unspecified: Secondary | ICD-10-CM

## 2017-03-28 DIAGNOSIS — D689 Coagulation defect, unspecified: Secondary | ICD-10-CM

## 2017-03-28 DIAGNOSIS — D696 Thrombocytopenia, unspecified: Secondary | ICD-10-CM

## 2017-03-28 LAB — BASIC METABOLIC PANEL
Anion gap: 9 (ref 5–15)
BUN: 25 mg/dL — AB (ref 6–20)
CALCIUM: 7.8 mg/dL — AB (ref 8.9–10.3)
CO2: 23 mmol/L (ref 22–32)
CREATININE: 2.07 mg/dL — AB (ref 0.61–1.24)
Chloride: 106 mmol/L (ref 101–111)
GFR, EST AFRICAN AMERICAN: 40 mL/min — AB (ref >60)
GFR, EST NON AFRICAN AMERICAN: 35 mL/min — AB (ref >60)
Glucose, Bld: 95 mg/dL (ref 65–99)
Potassium: 4.2 mmol/L (ref 3.5–5.1)
SODIUM: 138 mmol/L (ref 135–145)

## 2017-03-28 LAB — RAPID URINE DRUG SCREEN, HOSP PERFORMED
AMPHETAMINES: NOT DETECTED
Barbiturates: NOT DETECTED
Benzodiazepines: NOT DETECTED
Cocaine: NOT DETECTED
OPIATES: NOT DETECTED
TETRAHYDROCANNABINOL: NOT DETECTED

## 2017-03-28 LAB — CBC
HEMATOCRIT: 18.8 % — AB (ref 39.0–52.0)
HEMOGLOBIN: 6.5 g/dL — AB (ref 13.0–17.0)
MCH: 36.1 pg — AB (ref 26.0–34.0)
MCHC: 34.6 g/dL (ref 30.0–36.0)
MCV: 104.4 fL — AB (ref 78.0–100.0)
Platelets: 85 10*3/uL — ABNORMAL LOW (ref 150–400)
RBC: 1.8 MIL/uL — ABNORMAL LOW (ref 4.22–5.81)
WBC: 7 10*3/uL (ref 4.0–10.5)

## 2017-03-28 LAB — PREPARE RBC (CROSSMATCH)

## 2017-03-28 LAB — ETHANOL: Alcohol, Ethyl (B): 215 mg/dL — ABNORMAL HIGH (ref <5)

## 2017-03-28 LAB — PROTIME-INR
INR: 2.48
PROTHROMBIN TIME: 27.3 s — AB (ref 11.4–15.2)

## 2017-03-28 LAB — HEMOGLOBIN AND HEMATOCRIT, BLOOD
HEMATOCRIT: 26.5 % — AB (ref 39.0–52.0)
HEMOGLOBIN: 9.3 g/dL — AB (ref 13.0–17.0)

## 2017-03-28 MED ORDER — NADOLOL 20 MG PO TABS
20.0000 mg | ORAL_TABLET | Freq: Every day | ORAL | Status: DC
  Administered 2017-03-28 – 2017-03-29 (×2): 20 mg via ORAL
  Filled 2017-03-28: qty 1

## 2017-03-28 MED ORDER — ZOLPIDEM TARTRATE 5 MG PO TABS: 5.0000 mg | ORAL_TABLET | Freq: Every evening | ORAL | Status: DC | PRN

## 2017-03-28 MED ORDER — ACETAMINOPHEN 325 MG PO TABS: 650.0000 mg | ORAL_TABLET | Freq: Four times a day (QID) | ORAL | Status: DC | PRN

## 2017-03-28 MED ORDER — FUROSEMIDE 10 MG/ML IJ SOLN
20.0000 mg | Freq: Four times a day (QID) | INTRAMUSCULAR | Status: DC
  Administered 2017-03-28 (×2): 20 mg via INTRAVENOUS
  Filled 2017-03-28 (×2): qty 2

## 2017-03-28 MED ORDER — NADOLOL 40 MG PO TABS
40.0000 mg | ORAL_TABLET | Freq: Every day | ORAL | Status: DC
  Filled 2017-03-28: qty 1

## 2017-03-28 MED ORDER — SODIUM CHLORIDE 0.9 % IV SOLN: Freq: Once | INTRAVENOUS | Status: DC

## 2017-03-28 MED ORDER — ACETAMINOPHEN 650 MG RE SUPP: 650.0000 mg | Freq: Four times a day (QID) | RECTAL | Status: DC | PRN

## 2017-03-28 MED ORDER — PHYTONADIONE 5 MG PO TABS
5.0000 mg | ORAL_TABLET | Freq: Once | ORAL | Status: AC
  Administered 2017-03-28: 5 mg via ORAL
  Filled 2017-03-28: qty 1

## 2017-03-28 MED ORDER — DEXTROSE 5 % IV SOLN
1.0000 g | Freq: Every day | INTRAVENOUS | Status: DC
  Administered 2017-03-28 – 2017-03-29 (×3): 1 g via INTRAVENOUS
  Filled 2017-03-28 (×3): qty 10

## 2017-03-28 MED ORDER — ONDANSETRON HCL 4 MG PO TABS: 4.0000 mg | ORAL_TABLET | Freq: Four times a day (QID) | ORAL | Status: DC | PRN

## 2017-03-28 MED ORDER — SODIUM CHLORIDE 0.9 % IV SOLN: Freq: Once | INTRAVENOUS | Status: AC

## 2017-03-28 MED ORDER — SODIUM CHLORIDE 0.9 % IV SOLN
Freq: Once | INTRAVENOUS | Status: AC
  Administered 2017-03-29: 15:00:00 via INTRAVENOUS

## 2017-03-28 MED ORDER — NEPRO/CARBSTEADY PO LIQD
237.0000 mL | Freq: Three times a day (TID) | ORAL | Status: DC | PRN
  Filled 2017-03-28: qty 237

## 2017-03-28 MED ORDER — PANTOPRAZOLE SODIUM 40 MG IV SOLR
40.0000 mg | Freq: Two times a day (BID) | INTRAVENOUS | Status: DC
  Administered 2017-03-28 – 2017-03-29 (×3): 40 mg via INTRAVENOUS
  Filled 2017-03-28 (×3): qty 40

## 2017-03-28 MED ORDER — SPIRONOLACTONE 50 MG PO TABS
50.0000 mg | ORAL_TABLET | Freq: Every day | ORAL | Status: DC
  Filled 2017-03-28: qty 1

## 2017-03-28 MED ORDER — CALCIUM CARBONATE ANTACID 1250 MG/5ML PO SUSP
500.0000 mg | Freq: Four times a day (QID) | ORAL | Status: DC | PRN
  Filled 2017-03-28: qty 5

## 2017-03-28 MED ORDER — CAMPHOR-MENTHOL 0.5-0.5 % EX LOTN: 1.0000 "application " | TOPICAL_LOTION | Freq: Three times a day (TID) | CUTANEOUS | Status: DC | PRN

## 2017-03-28 MED ORDER — DOCUSATE SODIUM 283 MG RE ENEM: 1.0000 | ENEMA | RECTAL | Status: DC | PRN

## 2017-03-28 MED ORDER — LACTULOSE 10 GM/15ML PO SOLN
30.0000 g | Freq: Two times a day (BID) | ORAL | Status: DC
  Administered 2017-03-28 – 2017-03-30 (×5): 30 g via ORAL
  Filled 2017-03-28 (×6): qty 45

## 2017-03-28 MED ORDER — HYDROXYZINE HCL 25 MG PO TABS
25.0000 mg | ORAL_TABLET | Freq: Three times a day (TID) | ORAL | Status: DC | PRN
  Administered 2017-03-28: 25 mg via ORAL
  Filled 2017-03-28: qty 1

## 2017-03-28 MED ORDER — ONDANSETRON HCL 4 MG/2ML IJ SOLN: 4.0000 mg | Freq: Four times a day (QID) | INTRAMUSCULAR | Status: DC | PRN

## 2017-03-28 NOTE — Progress Notes (Addendum)
PROGRESS NOTE    Thomas [name]  QIH:[id number] DOB: 07/24/1961 DOA: 03/27/2017 PCP: [name], MD   No chief complaint on file.   Brief Narrative:  HPI On 03/27/2017 by Dr. Karmen Bongo Kalum Thomas [name] is a 56 y.o. male with medical history significant of alcoholic cirrhosis (reports cessation of alcohol for the past year and currently trying to transfer care to [facility] for consideration of transplant after having been declined by [facility]), prior history of GI bleed,prior history of hepatic encephalopathy,prior history of spontaneous bacterial peritonitis, coagulopathy, as well as ascites and hepatic hydrothorax requiring paracentesis and thoracentesis in June of last year presenting after having been sent by [facility] for abnormal labs.  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  He went in last Wednesday for lab work before an appointment Friday at [place].  He had to cancel Friday because of work issues.  They called him yesterday and told him to have repeat lab work because his kidney test was high.  Before he even got home, they called and said his kidney test was abnormal and blood count was down and he needed to come to the ER for admission and they will see him here in consultation tomorrow.   Very fatigued, difficulty walking or doing anything, very SOB with exertion.  SOB has been worsening over the last week.  Had improved with medications, even able to walk a couple of miles but not since last hospital discharge.  Unable to get up the steps, has to hold on.  Difficulty getting in the car.  Legs are very swollen after they cut back his diuretics.   +cough, nonproductive but frequent (has allergies).  ?recurrence of ascites.  Mild early satiety.  He is inconsistent with lactulose dosing.  Voids regularly and without difficulty.  Legs are weaker than  usual.  Assessment & Plan   Symptomatic anemia/?GIBleed -Patient with h/o known varices, heme positive stools in the ER; this is likely the source of his decreasing Hgb (Hgb 6.2; 7.3 on 3/27; 7.7 on 3/26) -presented with shortness of breath and weakness -given 1uPRBC on admission, additional 2u today -Start IV pantoprazole 40 mg BID -Continue nadolol  -Continue antiemetics PRN -Started on ceftriaxone for SBP prophylaxis (home cipro held) -Gastroenterology consulted and appreciated, plan for EGD 03/29/2017 -Plan for repeat H/H at 6pm  End-stage liver disease with elevated LFTs with Anasarca -Patient's last alcohol intake was in January 2018- according to patient -Alcohol leve 215 upon admission -MELD/MELD-Na score of 32, mortality rate 52.6% -INR 2.58 -paracentesis was ordered upon admission, however, canceled due to AKI  Acute renal failure -Baseline creatinine approximately 1.2-1.3 -Current was 2.42 upon admission -Currently creatinine trending downward 2.07 -Lasix and other diuretics have been held -Possible hepatorenal? Vs medications vs secondary to anemia and hypoperfusion  -Continue to monitor BMP  Thrombocytopenia, chronic -Platelets currently 85, have been much lower in March 2018 (down to 358) -Continue to monitor CBC  Hyperglycemia -Glucose 130 -May be stress response -Continue to monitor   DVT Prophylaxis  SCDs  Code Status: Full  Family Communication: Wife at bedside  Disposition Plan: Admitted. Pending further recommendations from GI.   Consultants Gastroenterology  Procedures  None  Antibiotics   Anti-infectives    Start     Dose/Rate Route Frequency Ordered Stop   03/28/17 0045  cefTRIAXone (ROCEPHIN) 1 g in dextrose 5 % 50  mL IVPB     1 g 100 mL/hr over 30 Minutes Intravenous Daily at bedtime 03/28/17 0039        Subjective:   Clark Cuff seen and examined today.  Patient continues to have some weakness. Denies chest pain. Has some  shortness of breath. Denies abdominal pain, nausea or vomiting.   Objective:   Vitals:   03/28/17 1153 03/28/17 1157 03/28/17 1231 03/28/17 1415  BP: (!) 121/57 (!) 121/56 (!) 119/54 126/64  Pulse: 79 79 79 77  Resp:  15 15 15   Temp:  98.7 F (37.1 C) 98.6 F (37 C) 98.4 F (36.9 C)  TempSrc:  Oral Oral Oral  SpO2:      Weight:      Height:        Intake/Output Summary (Last 24 hours) at 03/28/17 1604 Last data filed at 03/28/17 1415  Gross per 24 hour  Intake          1703.58 ml  Output              625 ml  Net          1078.58 ml   Filed Weights   03/28/17 0008  Weight: 81.6 kg (180 lb)    Exam  General: Well developed, well nourished, NAD, appears stated age  HEENT: NCAT, mucous membranes moist.   Cardiovascular: S1 S2 auscultated, no rubs, murmurs or gallops. Regular rate and rhythm.  Respiratory: Clear to auscultation bilaterally with equal chest rise  Abdomen: Soft, nontender, mildly distended, + bowel sounds  Extremities: warm dry without cyanosis clubbing. LE edema B/L   Neuro: AAOx3,nonfocal, slight asterixis   Skin: Skin changes notes on LE B/L, some erythema (eczema)  Psych: Normal affect and demeanor with intact judgement and insight   Data Reviewed: I have personally reviewed following labs and imaging studies  CBC:  Recent Labs Lab 03/27/17 1625 03/28/17 0435  WBC 9.6 7.0  NEUTROABS 5.4  --   HGB 6.2 Repeated and verified X2.* 6.5*  HCT 18.3 Repeated and verified X2.* 18.8*  MCV 118.3* 104.4*  PLT 121.0* 85*   Basic Metabolic Panel:  Recent Labs Lab 03/21/17 1619 03/27/17 1625 03/28/17 0435  NA 134* 138 138  K 4.7 4.3 4.2  CL 100 104 106  CO2 25 25 23   GLUCOSE 137* 130* 95  BUN 21 23 25*  CREATININE 2.47* 2.42* 2.07*  CALCIUM 8.1* 8.1* 7.8*   GFR: Estimated Creatinine Clearance: 40.1 mL/min (A) (by C-G formula based on SCr of 2.07 mg/dL (H)). Liver Function Tests:  Recent Labs Lab 03/27/17 2051  AST 70*  ALT 29    ALKPHOS 115  BILITOT 5.3*  PROT 5.9*  ALBUMIN 2.2*   No results for input(s): LIPASE, AMYLASE in the last 168 hours. No results for input(s): AMMONIA in the last 168 hours. Coagulation Profile:  Recent Labs Lab 03/27/17 1625 03/27/17 2051  INR 2.7* 2.58   Cardiac Enzymes:  Recent Labs Lab 03/27/17 2051  TROPONINI <0.03   BNP (last 3 results) No results for input(s): PROBNP in the last 8760 hours. HbA1C: No results for input(s): HGBA1C in the last 72 hours. CBG: No results for input(s): GLUCAP in the last 168 hours. Lipid Profile: No results for input(s): CHOL, HDL, LDLCALC, TRIG, CHOLHDL, LDLDIRECT in the last 72 hours. Thyroid Function Tests: No results for input(s): TSH, T4TOTAL, FREET4, T3FREE, THYROIDAB in the last 72 hours. Anemia Panel: No results for input(s): VITAMINB12, FOLATE, FERRITIN, TIBC, IRON, RETICCTPCT in  the last 72 hours. Urine analysis:    Component Value Date/Time   COLORURINE AMBER (A) 03/03/2017 1843   APPEARANCEUR CLEAR 03/03/2017 1843   LABSPEC 1.014 03/03/2017 1843   PHURINE 5.0 03/03/2017 1843   GLUCOSEU NEGATIVE 03/03/2017 1843   HGBUR NEGATIVE 03/03/2017 1843   BILIRUBINUR NEGATIVE 03/03/2017 1843   KETONESUR NEGATIVE 03/03/2017 1843   PROTEINUR NEGATIVE 03/03/2017 1843   UROBILINOGEN 0.2 11/22/2012 0856   NITRITE NEGATIVE 03/03/2017 1843   LEUKOCYTESUR NEGATIVE 03/03/2017 1843   Sepsis Labs: @LABRCNTIP (procalcitonin:4,lacticidven:4)  )No results found for this or any previous visit (from the past 240 hour(s)).    Radiology Studies: Dg Chest 2 View  Result Date: 03/27/2017 CLINICAL DATA:  Cough, sob, low hemoglobin, increased liver function at PCP today. Hx alcohol abuse, esophageal varices, GERD, htn, tubular adenoma of colon. Non smoker EXAM: CHEST  2 VIEW COMPARISON:  03/05/2017 FINDINGS: Elevation of the right hemidiaphragm. Infiltration or atelectasis present in the right lung base. This could indicate pneumonia in the  appropriate clinical setting. Heart size and pulmonary vascularity are normal. Probable small right pleural effusion. No pneumothorax. Mediastinal contours appear intact. Degenerative changes in the spine. IMPRESSION: Infiltration or atelectasis in the right lung base with probable small pleural effusion. Elevation of right hemidiaphragm. This could indicate pneumonia in the appropriate clinical setting. Electronically Signed   By: Lucienne Capers M.D.   On: 03/27/2017 21:28     Scheduled Meds: . lactulose  30 g Oral BID  . [START ON 03/29/2017] nadolol  20 mg Oral Daily  . pantoprazole (PROTONIX) IV  40 mg Intravenous Q12H   Continuous Infusions: . sodium chloride 10 mL/hr at 03/28/17 0000  . sodium chloride Stopped (03/28/17 0909)  . sodium chloride    . cefTRIAXone (ROCEPHIN)  IV Stopped (03/28/17 0225)     LOS: 1 day   Time Spent in minutes   30 minutes  Skylur Fuston D.O. on 03/28/2017 at 4:04 PM  Between 7am to 7pm - Pager - [phone]  After 7pm go to [email or web address] - password [email or web address]  And look for the night coverage person covering for me after hours  [facility] Office  [phone]

## 2017-03-28 NOTE — Consult Note (Signed)
Consultation  Referring Provider: Dr. Ree Kida     Primary Care Physician:  [name], MD Primary Gastroenterologist: Dr. Hilarie Fredrickson       Reason for Consultation: Cirrhosis, Anemia , AKI          HPI:   Thomas [name] is a 56 y.o. Caucasian male with a past medical history significant of alcoholic cirrhosis with most recent alcoholic intake in January, currently enrolled in the transplant center at [facility] where he was told in August that he was not a candidate, prior history of hepatic encephalopathy on lactulose, prior history of spontaneous bacterial peritonitis on prophylactic Cipro daily, coagulopathy as well as ascites and hepatic hydrothorax requiring paracentesis and thoracentesis in June 2017 and again in March 2018, who presented to the ED on 03/27/17 after finding of worsening kidney injury via outpatient labs.   Per review of chart patient has recently been consulted recently by our service in [facility] 03/04/17 for very similar symptoms. At that time, he was found to have worsening kidney function, pleural effusion as well as ascites. Patient's diuretics were adjusted and he had paracentesis as well as thoracentesis. Patient was also given one unit of PRBCs during that stay for hemoglobin below 7 on admission, he did not require endoscopic evaluation.   Today, the patient is found with his wife by his bedside. He explains that he has remained fairly weak since time of discharge last month. His wife notes that he can "barely make it up the 3 stairs", without having to take a break. Apparently this makes him short of breath and patient also describes feeling like his "balance is off". He has noted continued fluid buildup in his legs as well as abdomen. He does express questions regarding possible transfer of care to [facility] regarding liver transplant in the future. Today, he does admit his last alcohol intake was in January of this year.   Patient denies fever, chills, abdominal pain, change in bowel  habits, melena or hematochezia.  Past GI procedures: 04/19/16-EGD colon showed grade 1 varices and PHG 04/19/16-colonoscopy showed thrombosed external hemorrhoids on perianal exam and nonbleeding internal hemorrhoids as well as congested mucosa in the sigmoid colon and descending colon along with diverticulosis in the sigmoid colon  Past Medical History:  Diagnosis Date  . Alcohol abuse 2013  . Alcoholic cirrhosis ([place]) 08/5620  . Anemia 02/2016   in setting of GI blood loss, but MCV macrocytic.   . Coagulopathy ([place]) 05/2015  . Diverticulosis 09/2015  . 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 GERD (gastroesophageal reflux disease)   . Hyperlipidemia   . Hypertension   . Portal hypertension ([place]) 09/2015   with portal gastropathy  . Thrombocytopenia ([place]) 02/2016  . Tubular adenoma of colon 09/2015    Past Surgical History:  Procedure Laterality Date  . ESOPHAGOGASTRODUODENOSCOPY N/A 03/05/2016   Procedure: ESOPHAGOGASTRODUODENOSCOPY (EGD);  Surgeon: Ladene Artist, MD;  Location: [name];  Service: Endoscopy;  Laterality: N/A;  . testicle prosthesis      Family History  Problem Relation Age of Onset  . Diabetes Father   . Parkinson's disease Father   . Diabetes Paternal Aunt   . Glaucoma Mother     Social History  Substance Use Topics  . Smoking status: Never Smoker  . Smokeless tobacco: Never Used  . Alcohol use No     Comment: Quit 2/17, relapse in 1/18 for short period of time    Prior to Admission medications  Medication Sig Start Date End Date Taking? Authorizing Provider  bismuth subsalicylate (PEPTO BISMOL) 262 MG chewable tablet Chew 262-524 mg by mouth 3 (three) times daily as needed for indigestion or diarrhea or loose stools.    Yes Historical Provider, MD  calcium carbonate (TUMS EX) 750 MG chewable tablet Chew 1-2 tablets by mouth 3 (three) times daily as needed for heartburn.    Yes Historical Provider, MD  ciprofloxacin (CIPRO) 500 MG tablet  Take 500 mg by mouth at bedtime.   Yes Historical Provider, MD  furosemide (LASIX) 40 MG tablet Take 1 tablet (40 mg total) by mouth daily. 03/07/17  Yes Eugenie Filler, MD  lactulose (CHRONULAC) 10 GM/15ML solution Take 45 mLs (30 g total) by mouth 2 (two) times daily. 03/07/16  Yes Maryann Mikhail, DO  nadolol (CORGARD) 40 MG tablet TAKE 1 TABLET (40 MG TOTAL) BY MOUTH DAILY. 04/03/16  Yes [name], MD  pantoprazole (PROTONIX) 40 MG tablet TAKE 1 TABLET (40 MG TOTAL) BY MOUTH 2 (TWO) TIMES DAILY. 03/10/16  Yes [name], MD  spironolactone (ALDACTONE) 100 MG tablet Take 0.5 tablets (50 mg total) by mouth daily. 03/07/17  Yes Eugenie Filler, MD    Current Facility-Administered Medications  Medication Dose Route Frequency Provider Last Rate Last Dose  . 0.9 %  sodium chloride infusion   Intravenous Continuous Lacretia Leigh, MD 10 mL/hr at 03/28/17 0000    . 0.9 %  sodium chloride infusion   Intravenous Once [employer], DO      . acetaminophen (TYLENOL) tablet 650 mg  650 mg Oral Q6H PRN Karmen Bongo, MD       Or  . acetaminophen (TYLENOL) suppository 650 mg  650 mg Rectal Q6H PRN Karmen Bongo, MD      . calcium carbonate (dosed in mg elemental calcium) suspension 500 mg of elemental calcium  500 mg of elemental calcium Oral Q6H PRN Karmen Bongo, MD      . camphor-menthol [facility]) lotion 1 application  1 application Topical I6E PRN Karmen Bongo, MD       And  . hydrOXYzine (ATARAX/VISTARIL) tablet 25 mg  25 mg Oral Q8H PRN Karmen Bongo, MD      . cefTRIAXone (ROCEPHIN) 1 g in dextrose 5 % 50 mL IVPB  1 g Intravenous QHS Karmen Bongo, MD   Stopped at 03/28/17 0225  . docusate sodium (ENEMEEZ) enema 283 mg  1 enema Rectal PRN Karmen Bongo, MD      . feeding supplement (NEPRO CARB STEADY) liquid 237 mL  237 mL Oral TID PRN Karmen Bongo, MD      . lactulose (CHRONULAC) 10 GM/15ML solution 30 g  30 g Oral BID Karmen Bongo, MD   30 g at 03/28/17 0925  . nadolol (CORGARD)  tablet 40 mg  40 mg Oral Daily Karmen Bongo, MD      . ondansetron [facility]) tablet 4 mg  4 mg Oral Q6H PRN Karmen Bongo, MD       Or  . ondansetron [facility]) injection 4 mg  4 mg Intravenous Q6H PRN Karmen Bongo, MD      . pantoprazole (PROTONIX) injection 40 mg  40 mg Intravenous Q12H Karmen Bongo, MD      . zolpidem [facility]) tablet 5 mg  5 mg Oral QHS PRN Karmen Bongo, MD        Allergies as of 03/27/2017 - Review Complete 03/27/2017  Allergen Reaction Noted  . Rifaximin Swelling and Other (See Comments)  11/25/2016     Review of Systems:    Constitutional: No fever or chills Skin: No rash Cardiovascular: No chest pain Respiratory: Positive for SOB Gastrointestinal: See HPI and otherwise negative Genitourinary: No dysuria  Neurological: No headache Musculoskeletal: No new muscle or joint pain Hematologic: No bleeding or bruising Psychiatric: No history of depression or anxiety   Physical Exam:  Vital signs in last 24 hours: Temp:  [97.9 F (36.6 C)-98.8 F (37.1 C)] 98.5 F (36.9 C) (04/18 0927) Pulse Rate:  [70-95] 85 (04/18 0927) Resp:  [15-30] 15 (04/18 0927) BP: (100-124)/(41-68) 114/45 (04/18 0927) SpO2:  [95 %-99 %] 97 % (04/18 0600) Weight:  [180 lb (81.6 kg)] 180 lb (81.6 kg) (04/18 0008) Last BM Date: 03/27/17 General: Caucasian male appears to be in NAD, Well developed, Well nourished, alert and cooperative Head:  Normocephalic and atraumatic. Eyes:   PEERL, EOMI. No icterus. Conjunctiva pink. Ears:  Normal auditory acuity. Neck:  Supple Throat: Oral cavity and pharynx without inflammation, swelling or lesion.  Lungs: Respirations even and unlabored. Lungs clear to auscultation bilaterally.   No wheezes, crackles, or rhonchi.  Heart: Normal S1, S2. No MRG. Regular rate and rhythm. B/l peripheral edema Abdomen:  Soft, Mod distension, nontender. No rebound or guarding. Normal bowel sounds. No appreciable masses or hepatomegaly.Anasarca Rectal:   External exam: no hemorrhoids; Internal exam: green stool, no frank blood or melena Msk:  Symmetrical without gross deformities.  Extremities:  No deformity or joint abnormality.  Neurologic:  Alert and  oriented x4;  grossly normal neurologically. Slight asterixis Skin:   Psoriasis on b/l shins as well as changes of venous stasis Psychiatric: Demonstrates good judgement and reason without abnormal affect or behaviors.   LAB RESULTS:  Recent Labs  03/27/17 1625 03/28/17 0435  WBC 9.6 7.0  HGB 6.2 Repeated and verified X2.* 6.5*  HCT 18.3 Repeated and verified X2.* 18.8*  PLT 121.0* 85*   BMET  Recent Labs  03/27/17 1625 03/28/17 0435  NA 138 138  K 4.3 4.2  CL 104 106  CO2 25 23  GLUCOSE 130* 95  BUN 23 25*  CREATININE 2.42* 2.07*  CALCIUM 8.1* 7.8*   LFT  Recent Labs  03/27/17 2051  PROT 5.9*  ALBUMIN 2.2*  AST 70*  ALT 29  ALKPHOS 115  BILITOT 5.3*  BILIDIR 1.6*  IBILI 3.7*   PT/INR  Recent Labs  03/27/17 1625 03/27/17 2051  LABPROT 28.7* 28.2*  INR 2.7* 2.58    STUDIES: Dg Chest 2 View  Result Date: 03/27/2017 CLINICAL DATA:  Cough, sob, low hemoglobin, increased liver function at PCP today. Hx alcohol abuse, esophageal varices, GERD, htn, tubular adenoma of colon. Non smoker EXAM: CHEST  2 VIEW COMPARISON:  03/05/2017 FINDINGS: Elevation of the right hemidiaphragm. Infiltration or atelectasis present in the right lung base. This could indicate pneumonia in the appropriate clinical setting. Heart size and pulmonary vascularity are normal. Probable small right pleural effusion. No pneumothorax. Mediastinal contours appear intact. Degenerative changes in the spine. IMPRESSION: Infiltration or atelectasis in the right lung base with probable small pleural effusion. Elevation of right hemidiaphragm. This could indicate pneumonia in the appropriate clinical setting. Electronically Signed   By: Lucienne Capers M.D.   On: 03/27/2017 21:28   PREVIOUS  ENDOSCOPIES:            See HPI   Impression / Plan:   Impression: 1. Symptomatic anemia: History of known varices, heme positive stools in the ER, rectal exam  today revealed no acute GI bleeding or melena, patient's hemoglobin increased from 6.2-6.5 after 1 unit PRBCs early this morning, currently receiving another unit with another ordered; likely this is related to liver disease and dysfunctional bone marrow and not acute GI bleed, the patient has not had recent EGD and we will likely pursue this during hospital stay 2. 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 Renal failure: This is most concerning for the patient, creatinine 2.42 currently, previously 2.47 on 4/11, anasarca on exam, diuretics have been stopped, nadolol halved today  Plan: 1. Ordered B12 and folate as well as repeat hemoglobin at 6 PM tonight and again tomorrow morning 2. Ordered another unit of PRBCs (making this a total of 3) 3. Patient to stay on his lactulose as prescribed 4. Diuretics have been discontinued at this time to see if this helps his kidney function 5. Paracentesis was canceled today due to concerns for kidney function. This will likely need to be done before discharge, but will hold for now 6. Patient's Nadolol been decreased from 40 mg daily to 20 mg daily, did discuss with the patient that this can also hinder his kidney function as his liver disease progresses, we may have to increase this back after endoscopy tomorrow, pending findings 7. Plans for EGD tomorrow , scheduled at 3:00pm barring any abnormal lab testing in the morning. Did discuss risks, benefits, limitations and alternatives and the patient agrees to proceed. This was scheduled with Dr. Loletha Carrow. 8.  Patient will have regular diet today and be nothing by mouth after midnight 9. Dr. Loletha Carrow did discuss patient's request for transfer to [place] and possible liver transplant recommendations with him, we will not be pursuing this during patient's current hospital stay. Patient seems to be okay with this. 10. Continue empiric Rocephin 7 days, home Cipro is on hold 11. Continue IV Pantoprazole 40 mg twice a day 12. Did see the patient was morning with Dr. Loletha Carrow, if you have any further questions please let me know  Thank you for your kind consultation, we will continue to follow.  Lavone Nian [facility]  03/28/2017, 9:57 AM Pager #: [phone]   I have reviewed the entire case in detail with the above APP and discussed the plan in detail. We interviewed and examined this patient together about 9:00 this morning. Therefore, I agree with the diagnoses recorded above. In addition,  I have personally interviewed and examined the patient and have personally reviewed any abdominal/pelvic CT scan images.  My additional thoughts are as follows:  This is a complex patient whose clinical condition has been slowly deteriorating over this past year. His worsened renal function is very bothersome. He clearly limits the use of diuretics even though he has anasarca..  It is not clear that he has GI bleeding contributing to the anemia, but I think we should know that for sure with now admissions in a short period of time with decreased hemoglobin levels.  He is not currently a candidate for liver transplantation with alcohol use just a few months ago. The circumstances of them being denied listing for transplant by [facility] are currently unknown. Getting a second opinion by a transplant hepatology clinic at [facility] and outpatient endeavor. It seems very likely they would currently consider him  to be a candidate, but perhaps they can tell him what he would need to do to perhaps become a liver transplant candidate. He appears  to be a poor candidate for TIPS placement due to his elevated meld score.  I am concerned that the beta blocker may be worsening his renal function given his advanced disease state. If there are no significant varices or portal hypertension found on endoscopy, I will most likely discontinue it altogether.  The entire consult took me a total of 80 minutes time including all extensive chart review my interviewing and examining the patient as well as reviewing his plan in detail with him and his wife.  Nelida Meuse III Pager [phone]  Mon-Fri 8a-5p [phone] after 5p, weekends, holidays

## 2017-03-29 ENCOUNTER — Telehealth: Payer: Self-pay

## 2017-03-29 ENCOUNTER — Encounter (HOSPITAL_COMMUNITY): Payer: Self-pay

## 2017-03-29 ENCOUNTER — Encounter (HOSPITAL_COMMUNITY)
Admission: EM | Disposition: A | Discharge status: Home / Self Care | Payer: Self-pay | Source: Home / Self Care | Attending: Internal Medicine

## 2017-03-29 ENCOUNTER — Inpatient Hospital Stay (HOSPITAL_COMMUNITY): Payer: BLUE CROSS/BLUE SHIELD | Admitting: Certified Registered Nurse Anesthetist

## 2017-03-29 DIAGNOSIS — I8500 Esophageal varices without bleeding: Secondary | ICD-10-CM

## 2017-03-29 DIAGNOSIS — K7460 Unspecified cirrhosis of liver: Secondary | ICD-10-CM

## 2017-03-29 DIAGNOSIS — K7290 Hepatic failure, unspecified without coma: Secondary | ICD-10-CM

## 2017-03-29 HISTORY — PX: ESOPHAGOGASTRODUODENOSCOPY (EGD) WITH PROPOFOL: SHX5813

## 2017-03-29 LAB — COMPREHENSIVE METABOLIC PANEL
ALBUMIN: 2.1 g/dL — AB (ref 3.5–5.0)
ALT: 27 U/L (ref 17–63)
ANION GAP: 10 (ref 5–15)
AST: 66 U/L — AB (ref 15–41)
Alkaline Phosphatase: 104 U/L (ref 38–126)
BUN: 22 mg/dL — AB (ref 6–20)
CHLORIDE: 105 mmol/L (ref 101–111)
CO2: 25 mmol/L (ref 22–32)
Calcium: 8 mg/dL — ABNORMAL LOW (ref 8.9–10.3)
Creatinine, Ser: 1.53 mg/dL — ABNORMAL HIGH (ref 0.61–1.24)
GFR calc Af Amer: 58 mL/min — ABNORMAL LOW (ref >60)
GFR, EST NON AFRICAN AMERICAN: 50 mL/min — AB (ref >60)
Glucose, Bld: 102 mg/dL — ABNORMAL HIGH (ref 65–99)
POTASSIUM: 3.9 mmol/L (ref 3.5–5.1)
Sodium: 140 mmol/L (ref 135–145)
Total Bilirubin: 7.4 mg/dL — ABNORMAL HIGH (ref 0.3–1.2)
Total Protein: 5.6 g/dL — ABNORMAL LOW (ref 6.5–8.1)

## 2017-03-29 LAB — VITAMIN B12: VITAMIN B 12: 996 pg/mL — AB (ref 180–914)

## 2017-03-29 LAB — BPAM RBC
BLOOD PRODUCT EXPIRATION DATE: 201805032359
BLOOD PRODUCT EXPIRATION DATE: 201805032359
Blood Product Expiration Date: 201805032359
ISSUE DATE / TIME: 201804181203
ISSUE DATE / TIME: 201804172231
ISSUE DATE / TIME: 201804180847
UNIT TYPE AND RH: 6200
Unit Type and Rh: 6200
Unit Type and Rh: 6200

## 2017-03-29 LAB — TYPE AND SCREEN
ABO/RH(D): A POS
Antibody Screen: NEGATIVE
UNIT DIVISION: 0
Unit division: 0
Unit division: 0

## 2017-03-29 LAB — HEMOGLOBIN: Hemoglobin: 8.8 g/dL — ABNORMAL LOW (ref 13.0–17.0)

## 2017-03-29 LAB — FOLATE: FOLATE: 5.3 ng/mL — AB (ref >5.9)

## 2017-03-29 LAB — OCCULT BLOOD X 1 CARD TO LAB, STOOL: Fecal Occult Bld: POSITIVE — AB

## 2017-03-29 LAB — PROTIME-INR
INR: 2.36
PROTHROMBIN TIME: 26.2 s — AB (ref 11.4–15.2)

## 2017-03-29 SURGERY — ESOPHAGOGASTRODUODENOSCOPY (EGD) WITH PROPOFOL
Anesthesia: Monitor Anesthesia Care

## 2017-03-29 MED ORDER — LACTATED RINGERS IV SOLN
INTRAVENOUS | Status: DC | PRN
  Administered 2017-03-29: 14:00:00 via INTRAVENOUS

## 2017-03-29 MED ORDER — LIDOCAINE HCL (CARDIAC) 20 MG/ML IV SOLN
INTRAVENOUS | Status: DC | PRN
  Administered 2017-03-29: 50 mg via INTRAVENOUS

## 2017-03-29 MED ORDER — LIDOCAINE 2% (20 MG/ML) 5 ML SYRINGE
INTRAMUSCULAR | Status: AC
  Filled 2017-03-29: qty 5

## 2017-03-29 MED ORDER — PROPOFOL 10 MG/ML IV BOLUS
INTRAVENOUS | Status: AC
  Filled 2017-03-29: qty 40

## 2017-03-29 MED ORDER — PROPOFOL 500 MG/50ML IV EMUL
INTRAVENOUS | Status: DC | PRN
  Administered 2017-03-29: 350 ug/kg/min via INTRAVENOUS

## 2017-03-29 MED ORDER — GLYCOPYRROLATE 0.2 MG/ML IJ SOLN
INTRAMUSCULAR | Status: DC | PRN
  Administered 2017-03-29: 0.2 mg via INTRAVENOUS

## 2017-03-29 SURGICAL SUPPLY — 15 items

## 2017-03-29 NOTE — Progress Notes (Signed)
03/29/17 0520 Nursing Dr. Baltazar Najjar paged with pt/inr results of 27.3/2.48   . No return page received.

## 2017-03-29 NOTE — Anesthesia Preprocedure Evaluation (Signed)
Anesthesia Evaluation  Patient identified by MRN, date of birth, ID band Patient awake    Reviewed: Allergy & Precautions, NPO status , Patient's Chart, lab work & pertinent test results  Airway Mallampati: II  TM Distance: <3 FB Neck ROM: Full    Dental  (+) Teeth Intact   Pulmonary shortness of breath,    breath sounds clear to auscultation       Cardiovascular hypertension,  Rhythm:Regular Rate:Normal     Neuro/Psych    GI/Hepatic GERD  ,(+) Cirrhosis       ,   Endo/Other    Renal/GU Renal disease     Musculoskeletal   Abdominal (+) + obese,   Peds  Hematology  (+) anemia ,   Anesthesia Other Findings   Reproductive/Obstetrics                             Anesthesia Physical Anesthesia Plan  ASA: III  Anesthesia Plan: MAC   Post-op Pain Management:    Induction: Intravenous  Airway Management Planned: Natural Airway and Nasal Cannula  Additional Equipment:   Intra-op Plan:   Post-operative Plan:   Informed Consent: I have reviewed the patients History and Physical, chart, labs and discussed the procedure including the risks, benefits and alternatives for the proposed anesthesia with the patient or authorized representative who has indicated his/her understanding and acceptance.     Plan Discussed with:   Anesthesia Plan Comments:         Anesthesia Quick Evaluation

## 2017-03-29 NOTE — H&P (View-Only) (Signed)
Consultation  Referring Provider: Dr. Ree Kida     Primary Care Physician:  [name], MD Primary Gastroenterologist: Dr. Hilarie Fredrickson       Reason for Consultation: Cirrhosis, Anemia , AKI          HPI:   Thomas [name] is a 56 y.o. Caucasian male with a past medical history significant of alcoholic cirrhosis with most recent alcoholic intake in January, currently enrolled in the transplant center at [facility] where he was told in August that he was not a candidate, prior history of hepatic encephalopathy on lactulose, prior history of spontaneous bacterial peritonitis on prophylactic Cipro daily, coagulopathy as well as ascites and hepatic hydrothorax requiring paracentesis and thoracentesis in June 2017 and again in March 2018, who presented to the ED on 03/27/17 after finding of worsening kidney injury via outpatient labs.   Per review of chart patient has recently been consulted recently by our service in [facility] 03/04/17 for very similar symptoms. At that time, he was found to have worsening kidney function, pleural effusion as well as ascites. Patient's diuretics were adjusted and he had paracentesis as well as thoracentesis. Patient was also given one unit of PRBCs during that stay for hemoglobin below 7 on admission, he did not require endoscopic evaluation.   Today, the patient is found with his wife by his bedside. He explains that he has remained fairly weak since time of discharge last month. His wife notes that he can "barely make it up the 3 stairs", without having to take a break. Apparently this makes him short of breath and patient also describes feeling like his "balance is off". He has noted continued fluid buildup in his legs as well as abdomen. He does express questions regarding possible transfer of care to [facility] regarding liver transplant in the future. Today, he does admit his last alcohol intake was in January of this year.   Patient denies fever, chills, abdominal pain, change in bowel  habits, melena or hematochezia.  Past GI procedures: 04/19/16-EGD colon showed grade 1 varices and PHG 04/19/16-colonoscopy showed thrombosed external hemorrhoids on perianal exam and nonbleeding internal hemorrhoids as well as congested mucosa in the sigmoid colon and descending colon along with diverticulosis in the sigmoid colon  Past Medical History:  Diagnosis Date  . Alcohol abuse 2013  . Alcoholic cirrhosis ([place]) 04/7845  . Anemia 02/2016   in setting of GI blood loss, but MCV macrocytic.   . Coagulopathy ([place]) 05/2015  . Diverticulosis 09/2015  . Esophageal varices ([place]) 09/2015   small  . GERD (gastroesophageal reflux disease)   . Hyperlipidemia   . Hypertension   . Portal hypertension ([place]) 09/2015   with portal gastropathy  . Thrombocytopenia ([place]) 02/2016  . Tubular adenoma of colon 09/2015    Past Surgical History:  Procedure Laterality Date  . ESOPHAGOGASTRODUODENOSCOPY N/A 03/05/2016   Procedure: ESOPHAGOGASTRODUODENOSCOPY (EGD);  Surgeon: Ladene Artist, MD;  Location: [name];  Service: Endoscopy;  Laterality: N/A;  . testicle prosthesis      Family History  Problem Relation Age of Onset  . Diabetes Father   . Parkinson's disease Father   . Diabetes Paternal Aunt   . Glaucoma Mother     Social History  Substance Use Topics  . Smoking status: Never Smoker  . Smokeless tobacco: Never Used  . Alcohol use No     Comment: Quit 2/17, relapse in 1/18 for short period of time    Prior to Admission medications  Medication Sig Start Date End Date Taking? Authorizing Provider  bismuth subsalicylate (PEPTO BISMOL) 262 MG chewable tablet Chew 262-524 mg by mouth 3 (three) times daily as needed for indigestion or diarrhea or loose stools.    Yes Historical Provider, MD  calcium carbonate (TUMS EX) 750 MG chewable tablet Chew 1-2 tablets by mouth 3 (three) times daily as needed for heartburn.    Yes Historical Provider, MD  ciprofloxacin (CIPRO) 500 MG tablet  Take 500 mg by mouth at bedtime.   Yes Historical Provider, MD  furosemide (LASIX) 40 MG tablet Take 1 tablet (40 mg total) by mouth daily. 03/07/17  Yes Eugenie Filler, MD  lactulose (CHRONULAC) 10 GM/15ML solution Take 45 mLs (30 g total) by mouth 2 (two) times daily. 03/07/16  Yes Maryann Mikhail, DO  nadolol (CORGARD) 40 MG tablet TAKE 1 TABLET (40 MG TOTAL) BY MOUTH DAILY. 04/03/16  Yes [name], MD  pantoprazole (PROTONIX) 40 MG tablet TAKE 1 TABLET (40 MG TOTAL) BY MOUTH 2 (TWO) TIMES DAILY. 03/10/16  Yes [name], MD  spironolactone (ALDACTONE) 100 MG tablet Take 0.5 tablets (50 mg total) by mouth daily. 03/07/17  Yes Eugenie Filler, MD    Current Facility-Administered Medications  Medication Dose Route Frequency Provider Last Rate Last Dose  . 0.9 %  sodium chloride infusion   Intravenous Continuous Lacretia Leigh, MD 10 mL/hr at 03/28/17 0000    . 0.9 %  sodium chloride infusion   Intravenous Once [employer], DO      . acetaminophen (TYLENOL) tablet 650 mg  650 mg Oral Q6H PRN Karmen Bongo, MD       Or  . acetaminophen (TYLENOL) suppository 650 mg  650 mg Rectal Q6H PRN Karmen Bongo, MD      . calcium carbonate (dosed in mg elemental calcium) suspension 500 mg of elemental calcium  500 mg of elemental calcium Oral Q6H PRN Karmen Bongo, MD      . camphor-menthol [facility]) lotion 1 application  1 application Topical G3T PRN Karmen Bongo, MD       And  . hydrOXYzine (ATARAX/VISTARIL) tablet 25 mg  25 mg Oral Q8H PRN Karmen Bongo, MD      . cefTRIAXone (ROCEPHIN) 1 g in dextrose 5 % 50 mL IVPB  1 g Intravenous QHS Karmen Bongo, MD   Stopped at 03/28/17 0225  . docusate sodium (ENEMEEZ) enema 283 mg  1 enema Rectal PRN Karmen Bongo, MD      . feeding supplement (NEPRO CARB STEADY) liquid 237 mL  237 mL Oral TID PRN Karmen Bongo, MD      . lactulose (CHRONULAC) 10 GM/15ML solution 30 g  30 g Oral BID Karmen Bongo, MD   30 g at 03/28/17 0925  . nadolol (CORGARD)  tablet 40 mg  40 mg Oral Daily Karmen Bongo, MD      . ondansetron [facility]) tablet 4 mg  4 mg Oral Q6H PRN Karmen Bongo, MD       Or  . ondansetron [facility]) injection 4 mg  4 mg Intravenous Q6H PRN Karmen Bongo, MD      . pantoprazole (PROTONIX) injection 40 mg  40 mg Intravenous Q12H Karmen Bongo, MD      . zolpidem [facility]) tablet 5 mg  5 mg Oral QHS PRN Karmen Bongo, MD        Allergies as of 03/27/2017 - Review Complete 03/27/2017  Allergen Reaction Noted  . Rifaximin Swelling and Other (See Comments)  11/25/2016     Review of Systems:    Constitutional: No fever or chills Skin: No rash Cardiovascular: No chest pain Respiratory: Positive for SOB Gastrointestinal: See HPI and otherwise negative Genitourinary: No dysuria  Neurological: No headache Musculoskeletal: No new muscle or joint pain Hematologic: No bleeding or bruising Psychiatric: No history of depression or anxiety   Physical Exam:  Vital signs in last 24 hours: Temp:  [97.9 F (36.6 C)-98.8 F (37.1 C)] 98.5 F (36.9 C) (04/18 0927) Pulse Rate:  [70-95] 85 (04/18 0927) Resp:  [15-30] 15 (04/18 0927) BP: (100-124)/(41-68) 114/45 (04/18 0927) SpO2:  [95 %-99 %] 97 % (04/18 0600) Weight:  [180 lb (81.6 kg)] 180 lb (81.6 kg) (04/18 0008) Last BM Date: 03/27/17 General: Caucasian male appears to be in NAD, Well developed, Well nourished, alert and cooperative Head:  Normocephalic and atraumatic. Eyes:   PEERL, EOMI. No icterus. Conjunctiva pink. Ears:  Normal auditory acuity. Neck:  Supple Throat: Oral cavity and pharynx without inflammation, swelling or lesion.  Lungs: Respirations even and unlabored. Lungs clear to auscultation bilaterally.   No wheezes, crackles, or rhonchi.  Heart: Normal S1, S2. No MRG. Regular rate and rhythm. B/l peripheral edema Abdomen:  Soft, Mod distension, nontender. No rebound or guarding. Normal bowel sounds. No appreciable masses or hepatomegaly.Anasarca Rectal:   External exam: no hemorrhoids; Internal exam: green stool, no frank blood or melena Msk:  Symmetrical without gross deformities.  Extremities:  No deformity or joint abnormality.  Neurologic:  Alert and  oriented x4;  grossly normal neurologically. Slight asterixis Skin:   Psoriasis on b/l shins as well as changes of venous stasis Psychiatric: Demonstrates good judgement and reason without abnormal affect or behaviors.   LAB RESULTS:  Recent Labs  03/27/17 1625 03/28/17 0435  WBC 9.6 7.0  HGB 6.2 Repeated and verified X2.* 6.5*  HCT 18.3 Repeated and verified X2.* 18.8*  PLT 121.0* 85*   BMET  Recent Labs  03/27/17 1625 03/28/17 0435  NA 138 138  K 4.3 4.2  CL 104 106  CO2 25 23  GLUCOSE 130* 95  BUN 23 25*  CREATININE 2.42* 2.07*  CALCIUM 8.1* 7.8*   LFT  Recent Labs  03/27/17 2051  PROT 5.9*  ALBUMIN 2.2*  AST 70*  ALT 29  ALKPHOS 115  BILITOT 5.3*  BILIDIR 1.6*  IBILI 3.7*   PT/INR  Recent Labs  03/27/17 1625 03/27/17 2051  LABPROT 28.7* 28.2*  INR 2.7* 2.58    STUDIES: Dg Chest 2 View  Result Date: 03/27/2017 CLINICAL DATA:  Cough, sob, low hemoglobin, increased liver function at PCP today. Hx alcohol abuse, esophageal varices, GERD, htn, tubular adenoma of colon. Non smoker EXAM: CHEST  2 VIEW COMPARISON:  03/05/2017 FINDINGS: Elevation of the right hemidiaphragm. Infiltration or atelectasis present in the right lung base. This could indicate pneumonia in the appropriate clinical setting. Heart size and pulmonary vascularity are normal. Probable small right pleural effusion. No pneumothorax. Mediastinal contours appear intact. Degenerative changes in the spine. IMPRESSION: Infiltration or atelectasis in the right lung base with probable small pleural effusion. Elevation of right hemidiaphragm. This could indicate pneumonia in the appropriate clinical setting. Electronically Signed   By: Lucienne Capers M.D.   On: 03/27/2017 21:28   PREVIOUS  ENDOSCOPIES:            See HPI   Impression / Plan:   Impression: 1. Symptomatic anemia: History of known varices, heme positive stools in the ER, rectal exam  today revealed no acute GI bleeding or melena, patient's hemoglobin increased from 6.2-6.5 after 1 unit PRBCs early this morning, currently receiving another unit with another ordered; likely this is related to liver disease and dysfunctional bone marrow and not acute GI bleed, the patient has not had recent EGD and we will likely pursue this during hospital stay 2. 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 Renal failure: This is most concerning for the patient, creatinine 2.42 currently, previously 2.47 on 4/11, anasarca on exam, diuretics have been stopped, nadolol halved today  Plan: 1. Ordered B12 and folate as well as repeat hemoglobin at 6 PM tonight and again tomorrow morning 2. Ordered another unit of PRBCs (making this a total of 3) 3. Patient to stay on his lactulose as prescribed 4. Diuretics have been discontinued at this time to see if this helps his kidney function 5. Paracentesis was canceled today due to concerns for kidney function. This will likely need to be done before discharge, but will hold for now 6. Patient's Nadolol been decreased from 40 mg daily to 20 mg daily, did discuss with the patient that this can also hinder his kidney function as his liver disease progresses, we may have to increase this back after endoscopy tomorrow, pending findings 7. Plans for EGD tomorrow , scheduled at 3:00pm barring any abnormal lab testing in the morning. Did discuss risks, benefits, limitations and alternatives and the patient agrees to proceed. This was scheduled with Dr. Loletha Carrow. 8.  Patient will have regular diet today and be nothing by mouth after midnight 9. Dr. Loletha Carrow did discuss patient's request for transfer to [place] and possible liver transplant recommendations with him, we will not be pursuing this during patient's current hospital stay. Patient seems to be okay with this. 10. Continue empiric Rocephin 7 days, home Cipro is on hold 11. Continue IV Pantoprazole 40 mg twice a day 12. Did see the patient was morning with Dr. Loletha Carrow, if you have any further questions please let me know  Thank you for your kind consultation, we will continue to follow.  Lavone Nian [facility]  03/28/2017, 9:57 AM Pager #: [phone]   I have reviewed the entire case in detail with the above APP and discussed the plan in detail. We interviewed and examined this patient together about 9:00 this morning. Therefore, I agree with the diagnoses recorded above. In addition,  I have personally interviewed and examined the patient and have personally reviewed any abdominal/pelvic CT scan images.  My additional thoughts are as follows:  This is a complex patient whose clinical condition has been slowly deteriorating over this past year. His worsened renal function is very bothersome. He clearly limits the use of diuretics even though he has anasarca..  It is not clear that he has GI bleeding contributing to the anemia, but I think we should know that for sure with now admissions in a short period of time with decreased hemoglobin levels.  He is not currently a candidate for liver transplantation with alcohol use just a few months ago. The circumstances of them being denied listing for transplant by [facility] are currently unknown. Getting a second opinion by a transplant hepatology clinic at [facility] and outpatient endeavor. It seems very likely they would currently consider him  to be a candidate, but perhaps they can tell him what he would need to do to perhaps become a liver transplant candidate. He appears  to be a poor candidate for TIPS placement due to his elevated meld score.  I am concerned that the beta blocker may be worsening his renal function given his advanced disease state. If there are no significant varices or portal hypertension found on endoscopy, I will most likely discontinue it altogether.  The entire consult took me a total of 80 minutes time including all extensive chart review my interviewing and examining the patient as well as reviewing his plan in detail with him and his wife.  Nelida Meuse III Pager [phone]  Mon-Fri 8a-5p [phone] after 5p, weekends, holidays

## 2017-03-29 NOTE — Progress Notes (Signed)
Set pt up for repeat CBC. CMP and INR in our clinic on Monday 04/02/17.  He should keep follow up appt with [name], NP on 04/03/17 at 2:30-arrive at 2:15. He does need daily Folate supplement prescribed at discharge.  Thank you, we will sign off. Ellouise Newer, PA-C [facility]

## 2017-03-29 NOTE — Transfer of Care (Signed)
Immediate Anesthesia Transfer of Care Note  Patient: Thomas [name]  Procedure(s) Performed: Procedure(s): ESOPHAGOGASTRODUODENOSCOPY (EGD) WITH PROPOFOL (N/A)  Patient Location: PACU  Anesthesia Type:MAC  Level of Consciousness: awake, alert , oriented and patient cooperative  Airway & Oxygen Therapy: Patient Spontanous Breathing and Patient connected to nasal cannula oxygen  Post-op Assessment: Report given to RN, Post -op Vital signs reviewed and stable and Patient moving all extremities X 4  Post vital signs: stable  Last Vitals:  Vitals:   03/29/17 1426 03/29/17 1518  BP: (!) 150/95 (P) 125/63  Pulse: 70 (P) 76  Resp: 20 (!) (P) 25  Temp: 37.1 C (P) 36.6 C    Last Pain:  Vitals:   03/29/17 1518  TempSrc: (P) Oral  PainSc:          Complications: No apparent anesthesia complications

## 2017-03-29 NOTE — Telephone Encounter (Signed)
-----   Message from Levin Erp, [place] sent at 03/29/2017  4:04 PM EDT ----- Regarding: Labs This patient is being discharged today. Please order CBC, CMP and INR to be drawn next Monday 04/02/17 in our lab and let pt know. He already has follow up appt with paula set up so he should keep that thank you. Thank you-JLL

## 2017-03-29 NOTE — Op Note (Signed)
[facility] Patient Name: Thomas [name] Procedure Date: 03/29/2017 MRN: [id number] Attending MD: Estill Cotta. Loletha Carrow , MD Date of Birth: Jan 03, 1961 CSN: [id number] Age: 56 Admit Type: Inpatient Procedure:                Upper GI endoscopy Indications:              Esophageal varices, Anemia Providers:                Mallie Mussel L. Loletha Carrow, MD, Zenon Mayo, RN, William Dalton, Technician Referring MD:              Medicines:                Monitored Anesthesia Care Complications:            No immediate complications. Estimated Blood Loss:     Estimated blood loss: none. Procedure:                Pre-Anesthesia Assessment:                           - Prior to the procedure, a History and Physical                            was performed, and patient medications and                            allergies were reviewed. The patient's tolerance of                            previous anesthesia was also reviewed. The risks                            and benefits of the procedure and the sedation                            options and risks were discussed with the patient.                            All questions were answered, and informed consent                            was obtained. Prior Anticoagulants: The patient has                            taken no previous anticoagulant or antiplatelet                            agents. ASA Grade Assessment: III - A patient with                            severe systemic disease. After reviewing the risks  and benefits, the patient was deemed in                            satisfactory condition to undergo the procedure.                           After obtaining informed consent, the endoscope was                            passed under direct vision. Throughout the                            procedure, the patient's blood pressure, pulse, and                            oxygen saturations  were monitored continuously. The                            [id number]) scope was introduced through the                            mouth, and advanced to the second part of duodenum.                            The upper GI endoscopy was accomplished without                            difficulty. The patient tolerated the procedure                            well. Scope In: Scope Out: Findings:      Grade I varices were found in the lower third of the esophagus.      Mild portal hypertensive gastropathy was found in the entire examined       stomach.      The cardia and gastric fundus were normal on retroflexion. Specifically,       no gastric varices were seen.      The examined duodenum was normal. Impression:               - Grade I esophageal varices.                           - Portal hypertensive gastropathy.                           - Normal examined duodenum.                           - No specimens collected. Moderate Sedation:      MAC sedation used Recommendation:           - Low sodium diet.                           - Discontinue nadolol (see consult - may be  worsening renal function)                           Patient can be discharged home today - has                            outpatient follow up next week.                           Repat CBC/CMP/INR the day before clinic appt next                            week.                           - Post procedure medication orders were given. Procedure Code(s):        --- Professional ---                           [phone], Esophagogastroduodenoscopy, flexible,                            transoral; diagnostic, including collection of                            specimen(s) by brushing or washing, when performed                            (separate procedure) Diagnosis Code(s):        --- Professional ---                           I85.00, Esophageal varices without bleeding                            K76.6, Portal hypertension                           K31.89, Other diseases of stomach and duodenum CPT copyright 2016 American Medical Association. All rights reserved. The codes documented in this report are preliminary and upon coder review may  be revised to meet current compliance requirements. Ewald Beg L. Loletha Carrow, MD 03/29/2017 3:19:05 PM This report has been signed electronically. Number of Addenda: 0

## 2017-03-29 NOTE — Interval H&P Note (Signed)
History and Physical Interval Note:  03/29/2017 2:56 PM  Thomas [name]  has presented today for surgery, with the diagnosis of Anemia  The various methods of treatment have been discussed with the patient and family. After consideration of risks, benefits and other options for treatment, the patient has consented to  Procedure(s): ESOPHAGOGASTRODUODENOSCOPY (EGD) WITH PROPOFOL (N/A) as a surgical intervention .  The patient's history has been reviewed, patient examined, no change in status, stable for surgery.  I have reviewed the patient's chart and labs.  Questions were answered to the patient's satisfaction.     Thomas [name]

## 2017-03-29 NOTE — Anesthesia Postprocedure Evaluation (Addendum)
Anesthesia Post Note  Patient: Saintclair Schroader  Procedure(s) Performed: Procedure(s) (LRB): ESOPHAGOGASTRODUODENOSCOPY (EGD) WITH PROPOFOL (N/A)  Patient location during evaluation: PACU Anesthesia Type: MAC Level of consciousness: awake and alert Pain management: pain level controlled Vital Signs Assessment: post-procedure vital signs reviewed and stable Respiratory status: spontaneous breathing and respiratory function stable Cardiovascular status: stable Anesthetic complications: no       Last Vitals:  Vitals:   03/29/17 1518 03/29/17 1520  BP: 125/63 124/61  Pulse: 76 76  Resp: (!) 25 (!) 24  Temp: 36.6 C     Last Pain:  Vitals:   03/29/17 1518  TempSrc: Oral  PainSc:                  SINGER,JAMES DANIEL

## 2017-03-29 NOTE — Progress Notes (Signed)
PROGRESS NOTE    Thomas [name]  YBO:[id number] DOB: 01/15/1961 DOA: 03/27/2017 PCP: [name], MD   No chief complaint on file.   Brief Narrative:  HPI On 03/27/2017 by Dr. Karmen Bongo Thomas [name] is a 56 y.o. male with medical history significant of alcoholic cirrhosis (reports cessation of alcohol for the past year and currently trying to transfer care to [facility] for consideration of transplant after having been declined by [facility]), prior history of GI bleed,prior history of hepatic encephalopathy,prior history of spontaneous bacterial peritonitis, coagulopathy, as well as ascites and hepatic hydrothorax requiring paracentesis and thoracentesis in June of last year presenting after having been sent by [facility] for abnormal labs.  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  He went in last Wednesday for lab work before an appointment Friday at [facility].  He had to cancel Friday because of work issues.  They called him yesterday and told him to have repeat lab work because his kidney test was high.  Before he even got home, they called and said his kidney test was abnormal and blood count was down and he needed to come to the ER for admission and they will see him here in consultation tomorrow.   Very fatigued, difficulty walking or doing anything, very SOB with exertion.  SOB has been worsening over the last week.  Had improved with medications, even able to walk a couple of miles but not since last hospital discharge.  Unable to get up the steps, has to hold on.  Difficulty getting in the car.  Legs are very swollen after they cut back his diuretics.   +cough, nonproductive but frequent (has allergies).  ?recurrence of ascites.  Mild early satiety.  He is inconsistent with lactulose dosing.  Voids regularly and without difficulty.  Legs are weaker than  usual.  Assessment & Plan   Symptomatic anemia/?GIBleed -Patient with h/o known varices, heme positive stools in the ER; this is likely the source of his decreasing Hgb (Hgb 6.2; 7.3 on 3/27; 7.7 on 3/26) -presented with shortness of breath and weakness. -Feels SOB and weakness are improving  -Start IV pantoprazole 40 mg BID -Continue nadolol  -Continue antiemetics PRN -Started on ceftriaxone for SBP prophylaxis (home cipro held) -Gastroenterology consulted and appreciated, plan for EGD 03/29/2017 -hemoglobin currently 9.1 -Continue to monitor CBC  End-stage liver disease with elevated LFTs with Anasarca -Patient's last alcohol intake was in January 2018- according to patient -Alcohol leve 215 upon admission -MELD/MELD-Na score of 32, mortality rate 52.6% -INR 2.58- was given Vitamin K PO, INR currently 2.36 -paracentesis was ordered upon admission, however, canceled due to AKI -Patient wanted to be transferred to [facility] for transplant clinic. He was recently denied [facility] transplant list. Of note, patient's alcohol level was 215 upon admission.   Acute renal failure -Baseline creatinine approximately 1.2-1.3 -Current was 2.42 upon admission -Currently creatinine trending downward 1.53 -Lasix and other diuretics have been held -Possible hepatorenal? Vs medications vs secondary to anemia and hypoperfusion  -Continue to monitor BMP  Thrombocytopenia, chronic -Platelets currently 78, have been much lower in March 2018 (down to 38) -Continue to monitor CBC  Hyperglycemia -Glucose 130 -May be stress response -Continue to monitor   Alcohol use -Patient states his last alcohol use was 4 months ago -Discussed this with patient and wife as alcohol level on 03/27/2017 was 215 -Patient states  he goes straight home after work. Patient's wife states he does not drink and that "alcohol swabs/wipes were used to wipe down his skin before blood was drawn" -Encouraged patient to continue  alcohol cessation   DVT Prophylaxis  SCDs  Code Status: Full  Family Communication: Wife at bedside  Disposition Plan: Admitted. Pending further recommendations from GI.   Consultants Gastroenterology  Procedures  None  Antibiotics   Anti-infectives    Start     Dose/Rate Route Frequency Ordered Stop   03/28/17 0045  cefTRIAXone (ROCEPHIN) 1 g in dextrose 5 % 50 mL IVPB     1 g 100 mL/hr over 30 Minutes Intravenous Daily at bedtime 03/28/17 0039        Subjective:   Thomas [name] seen and examined today.  Patient states he is feeling better. States he is ready for the EGD. Currently denies chest pain, shortness of breath, abdominal pain, nausea, vomiting, diarrhea, constipation. Does continue to feel leg swelling.   Objective:   Vitals:   03/28/17 2129 03/29/17 0515 03/29/17 0930 03/29/17 1352  BP: (!) 145/66 (!) 125/52 (!) 141/57 126/68  Pulse: 76 78 71 71  Resp: 16 16  16   Temp: 99.6 F (37.6 C) 98.9 F (37.2 C)  98.4 F (36.9 C)  TempSrc: Oral Oral  Oral  SpO2: 98% 98%  96%  Weight:      Height:        Intake/Output Summary (Last 24 hours) at 03/29/17 1353 Last data filed at 03/29/17 1353  Gross per 24 hour  Intake              722 ml  Output                0 ml  Net              722 ml   Filed Weights   03/28/17 0008  Weight: 81.6 kg (180 lb)    Exam  General: Well developed, well nourished, NAD, appears stated age  HEENT: NCAT, mucous membranes moist.   Cardiovascular: S1 S2 auscultated, no murmurs, RRR  Respiratory: Clear to auscultation bilaterally with equal chest rise  Abdomen: Soft, nontender, mildly distended, + bowel sounds  Extremities: warm dry without cyanosis clubbing. +2 LE edema B/L extending to thighs  Neuro: AAOx3,nonfocal  Skin: Skin changes notes on LE B/L, some erythema (eczema)  Psych: Appropriate mood and affect   Data Reviewed: I have personally reviewed following labs and imaging studies  CBC:  Recent  Labs Lab 03/27/17 1625 03/28/17 0435 03/28/17 2007 03/29/17 0428 03/29/17 0651  WBC 9.6 7.0  --  6.6  --   NEUTROABS 5.4  --   --   --   --   HGB 6.2 Repeated and verified X2.* 6.5* 9.3* 9.1* 8.8*  HCT 18.3 Repeated and verified X2.* 18.8* 26.5* 27.0*  --   MCV 118.3* 104.4*  --  95.4  --   PLT 121.0* 85*  --  78*  --    Basic Metabolic Panel:  Recent Labs Lab 03/27/17 1625 03/28/17 0435 03/29/17 0428  NA 138 138 140  K 4.3 4.2 3.9  CL 104 106 105  CO2 25 23 25   GLUCOSE 130* 95 102*  BUN 23 25* 22*  CREATININE 2.42* 2.07* 1.53*  CALCIUM 8.1* 7.8* 8.0*   GFR: Estimated Creatinine Clearance: 54.3 mL/min (A) (by C-G formula based on SCr of 1.53 mg/dL (H)). Liver Function Tests:  Recent Labs Lab 03/27/17 2051  03/29/17 0428  AST 70* 66*  ALT 29 27  ALKPHOS 115 104  BILITOT 5.3* 7.4*  PROT 5.9* 5.6*  ALBUMIN 2.2* 2.1*   No results for input(s): LIPASE, AMYLASE in the last 168 hours. No results for input(s): AMMONIA in the last 168 hours. Coagulation Profile:  Recent Labs Lab 03/27/17 1625 03/27/17 2051 03/28/17 2007 03/29/17 0813  INR 2.7* 2.58 2.48 2.36   Cardiac Enzymes:  Recent Labs Lab 03/27/17 2051  TROPONINI <0.03   BNP (last 3 results) No results for input(s): PROBNP in the last 8760 hours. HbA1C: No results for input(s): HGBA1C in the last 72 hours. CBG: No results for input(s): GLUCAP in the last 168 hours. Lipid Profile: No results for input(s): CHOL, HDL, LDLCALC, TRIG, CHOLHDL, LDLDIRECT in the last 72 hours. Thyroid Function Tests: No results for input(s): TSH, T4TOTAL, FREET4, T3FREE, THYROIDAB in the last 72 hours. Anemia Panel:  Recent Labs  03/29/17 0651  VITAMINB12 996*  FOLATE 5.3*   Urine analysis:    Component Value Date/Time   COLORURINE AMBER (A) 03/03/2017 1843   APPEARANCEUR CLEAR 03/03/2017 1843   LABSPEC 1.014 03/03/2017 1843   PHURINE 5.0 03/03/2017 1843   GLUCOSEU NEGATIVE 03/03/2017 1843   HGBUR NEGATIVE  03/03/2017 1843   BILIRUBINUR NEGATIVE 03/03/2017 1843   KETONESUR NEGATIVE 03/03/2017 1843   PROTEINUR NEGATIVE 03/03/2017 1843   UROBILINOGEN 0.2 11/22/2012 0856   NITRITE NEGATIVE 03/03/2017 1843   LEUKOCYTESUR NEGATIVE 03/03/2017 1843   Sepsis Labs: @LABRCNTIP (procalcitonin:4,lacticidven:4)  )No results found for this or any previous visit (from the past 240 hour(s)).    Radiology Studies: Dg Chest 2 View  Result Date: 03/27/2017 CLINICAL DATA:  Cough, sob, low hemoglobin, increased liver function at PCP today. Hx alcohol abuse, esophageal varices, GERD, htn, tubular adenoma of colon. Non smoker EXAM: CHEST  2 VIEW COMPARISON:  03/05/2017 FINDINGS: Elevation of the right hemidiaphragm. Infiltration or atelectasis present in the right lung base. This could indicate pneumonia in the appropriate clinical setting. Heart size and pulmonary vascularity are normal. Probable small right pleural effusion. No pneumothorax. Mediastinal contours appear intact. Degenerative changes in the spine. IMPRESSION: Infiltration or atelectasis in the right lung base with probable small pleural effusion. Elevation of right hemidiaphragm. This could indicate pneumonia in the appropriate clinical setting. Electronically Signed   By: Lucienne Capers M.D.   On: 03/27/2017 21:28     Scheduled Meds: . lactulose  30 g Oral BID  . nadolol  20 mg Oral Daily  . pantoprazole (PROTONIX) IV  40 mg Intravenous Q12H   Continuous Infusions: . sodium chloride 20 mL/hr at 03/29/17 1000  . sodium chloride Stopped (03/28/17 0909)  . sodium chloride    . cefTRIAXone (ROCEPHIN)  IV Stopped (03/28/17 2142)     LOS: 2 days   Time Spent in minutes   30 minutes  Cheila Wickstrom D.O. on 03/29/2017 at 1:53 PM  Between 7am to 7pm - Pager - [phone]  After 7pm go to [email or web address] - password [email or web address]  And look for the night coverage person covering for me after hours  [facility] Office  [phone]

## 2017-03-30 ENCOUNTER — Other Ambulatory Visit: Payer: Self-pay

## 2017-03-30 ENCOUNTER — Encounter (HOSPITAL_COMMUNITY): Payer: Self-pay | Admitting: Gastroenterology

## 2017-03-30 LAB — CBC
HCT: 27 % — ABNORMAL LOW (ref 39.0–52.0)
HEMATOCRIT: 25.9 % — AB (ref 39.0–52.0)
HEMOGLOBIN: 9 g/dL — AB (ref 13.0–17.0)
Hemoglobin: 9.1 g/dL — ABNORMAL LOW (ref 13.0–17.0)
MCH: 32.2 pg (ref 26.0–34.0)
MCH: 34.2 pg — ABNORMAL HIGH (ref 26.0–34.0)
MCHC: 33.7 g/dL (ref 30.0–36.0)
MCHC: 34.7 g/dL (ref 30.0–36.0)
MCV: 95.4 fL (ref 78.0–100.0)
MCV: 98.5 fL (ref 78.0–100.0)
PLATELETS: 78 10*3/uL — AB (ref 150–400)
Platelets: 72 10*3/uL — ABNORMAL LOW (ref 150–400)
RBC: 2.83 MIL/uL — ABNORMAL LOW (ref 4.22–5.81)
RBC: 2.63 MIL/uL — ABNORMAL LOW (ref 4.22–5.81)
RDW: 27.2 % — AB (ref 11.5–15.5)
RDW: 26.3 % — AB (ref 11.5–15.5)
WBC: 6.6 10*3/uL (ref 4.0–10.5)
WBC: 6.7 10*3/uL (ref 4.0–10.5)

## 2017-03-30 LAB — COMPREHENSIVE METABOLIC PANEL
ALT: 26 U/L (ref 17–63)
AST: 65 U/L — AB (ref 15–41)
Albumin: 2 g/dL — ABNORMAL LOW (ref 3.5–5.0)
Alkaline Phosphatase: 96 U/L (ref 38–126)
Anion gap: 6 (ref 5–15)
BILIRUBIN TOTAL: 7.7 mg/dL — AB (ref 0.3–1.2)
BUN: 18 mg/dL (ref 6–20)
CO2: 27 mmol/L (ref 22–32)
Calcium: 7.9 mg/dL — ABNORMAL LOW (ref 8.9–10.3)
Chloride: 106 mmol/L (ref 101–111)
Creatinine, Ser: 1.15 mg/dL (ref 0.61–1.24)
Glucose, Bld: 103 mg/dL — ABNORMAL HIGH (ref 65–99)
POTASSIUM: 4.2 mmol/L (ref 3.5–5.1)
Sodium: 139 mmol/L (ref 135–145)
TOTAL PROTEIN: 5.7 g/dL — AB (ref 6.5–8.1)

## 2017-03-30 MED ORDER — FOLIC ACID 1 MG PO TABS: 1.0000 mg | ORAL_TABLET | Freq: Every day | ORAL | 0 refills | Status: DC

## 2017-03-30 NOTE — Telephone Encounter (Signed)
Left message on machine to call back lab order in EPIC 

## 2017-03-30 NOTE — Discharge Summary (Addendum)
Physician Discharge Summary  Velma Hanna KDT:[id number] DOB: 1961/05/14 DOA: 03/27/2017  PCP: [name], MD  Admit date: 03/27/2017 Discharge date: 03/30/2017  Time spent: 45 minutes  Recommendations for Outpatient Follow-up:  Patient will be discharged to home.  Patient will need to follow up with primary care provider within one week of discharge, repeat CBC, BMP, INR (on 04/02/2017).  Follow up with gastroenterology, Ms. [name], NP, on 04/03/17 at 2:15pm.  Patient should continue medications as prescribed.  Patient should follow a low sodium diet.   Discharge Diagnoses:  Symptomatic anemia/?GIBleed End-stage liver disease with elevated LFTs with Anasarca Acute renal failure Thrombocytopenia, chronic Hyperglycemia Alcohol use  Discharge Condition: Stable  Diet recommendation: low sodium diet  Filed Weights   03/28/17 0008 03/29/17 1426  Weight: 81.6 kg (180 lb) 81.6 kg (180 lb)    History of present illness:  On 03/27/2017 by Dr. Azalia Bilis Duncanis a 56 y.o.malewith medical history significant of alcoholic cirrhosis (reports cessation ofalcohol for the past year and currently [facility] of [facility] after having been declined by [facility]), prior history of GI bleed,prior history of hepatic encephalopathy,prior history of spontaneous bacterial peritonitis, coagulopathy, as well as ascites and hepatic hydrothorax requiring paracentesis and thoracentesis in June of last year presenting after having been sent by [facility] for abnormal labs. He reports "I've got liver disease, bad lifestyle." He was hospitalized from 3/24-27 for multifactorial dyspnea related to decompensated end-stage liver disease due to alcoholic cirrhosis. He went in last Wednesday for lab work before an appointment Friday at [place]. He had to cancel Friday because of work issues. They called him yesterday and told him to have repeat lab work because  his kidney test was high. Before he even got home, they called and said his kidney test was abnormal and blood count was down and he needed to come to the ER for admission and they will see him here in consultation tomorrow.  Very fatigued, difficulty walking or doing anything, very SOB with exertion. SOB has been worsening over the last week. Had improved with medications, even able to walk a couple of miles but not since lasthospital discharge. Unable to get up the steps, has to hold on. Difficulty getting in the car. Legs are very swollen after they cut back his diuretics.  +cough, nonproductive but frequent (has allergies). ?recurrence of ascites. Mild early satiety. He is inconsistent with lactulose dosing. Voids regularly and without difficulty. Legs are weaker than usual.  Hospital Course:  Symptomatic anemia/?GIBleed -Patient with h/o known varices, heme positive stools in the ER; this is likely the source of his decreasing Hgb (Hgb 6.2; 7.3 on 3/27; 7.7 on 3/26) -presented with shortness of breath and weakness. -Feels SOB and weakness are improving  -Start IV pantoprazole 40 mg BID -Continue nadolol  -Continue antiemetics PRN -Started on ceftriaxone for SBP prophylaxis (home cipro held) -Gastroenterology consulted and appreciated -S/p EGD 03/29/2017: grade I eosphageal varices, portal hypertensive gastropathy  -GI recommendations: discontinue nadolol and outpatient follow up, folate supplement -hemoglobin currently 9.0 -Repeat CBC   End-stage liver disease with elevated LFTs with Anasarca -Patient's last alcohol intake was in January 2018- according to patient -Alcohol leve 215 upon admission -MELD/MELD-Na score of 32, mortality rate 52.6% -INR 2.58- was given Vitamin K PO, INR currently 2.36 -Recheck INR on 04/02/2017 -paracentesis was ordered upon admission, however, canceled due to AKI -Patient wanted to be transferred to [facility] for transplant clinic. He was recently  denied [facility] transplant list. Of note, patient's alcohol level was 215 upon admission.   Acute renal failure -Baseline creatinine approximately 1.2-1.3 -Current was 2.42 upon admission -Currently creatinine trending downward 1.15 -Lasix and other diuretics have been held- may be restarted on discharge -Possible hepatorenal? Vs medications vs secondary to anemia and hypoperfusion  -Repeat CMP  Thrombocytopenia, chronic -Platelets currently 72, have been much lower in March 2018 (down to 38) -Repeat CBC  Hyperglycemia -Glucose 130, now 103 -May be stress response  Alcohol use -Patient states his last alcohol use was 4 months ago -Discussed this with patient and wife as alcohol level on 03/27/2017 was 215 -Patient states he goes straight home after work. Patient's wife states he does not drink and that "alcohol swabs/wipes were used to wipe down his skin before blood was drawn" -Encouraged patient to continue alcohol cessation   Consultants Gastroenterology  Procedures  EGD  Discharge Exam: Vitals:   03/30/17 0606 03/30/17 0800  BP: (!) 146/75 129/63  Pulse: 74 70  Resp: 18 20  Temp: 98.7 F (37.1 C) 98.4 F (36.9 C)   Patient feeling better today. Denies shortness of breath, chest pain, abdominal pain, N/V/D/C.   Exam  General: Well developed, well nourished, NAD, appears stated age  56: NCAT, mucous membranes moist.   Cardiovascular: S1 S2 auscultated, no murmurs, RRR  Respiratory: Clear to auscultation bilaterally   Abdomen: Soft, nontender, mildly distended, + bowel sounds  Extremities: warm dry without cyanosis clubbing. +2 LE edema B/L extending to thighs  Neuro: AAOx3,nonfocal  Skin: Skin changes notes on LE B/L, some erythema (eczema)-unchanged  Psych: Appropriate mood and affect, pleasant  Discharge Instructions Discharge Instructions    Discharge instructions    Complete by:  As directed    Patient will be discharged to home.  Patient will  need to follow up with primary care provider within one week of discharge, repeat CBC, BMP, INR (on 04/02/2017).  Follow up with gastroenterology, Ms. [name], NP, on 04/03/17 at 2:15pm.  Patient should continue medications as prescribed.  Patient should follow a low sodium diet.     Current Discharge Medication List    CONTINUE these medications which have NOT CHANGED   Details  bismuth subsalicylate (PEPTO BISMOL) 262 MG chewable tablet Chew 262-524 mg by mouth 3 (three) times daily as needed for indigestion or diarrhea or loose stools.     calcium carbonate (TUMS EX) 750 MG chewable tablet Chew 1-2 tablets by mouth 3 (three) times daily as needed for heartburn.     ciprofloxacin (CIPRO) 500 MG tablet Take 500 mg by mouth at bedtime.    furosemide (LASIX) 40 MG tablet Take 1 tablet (40 mg total) by mouth daily. Qty: 30 tablet, Refills: 0    lactulose (CHRONULAC) 10 GM/15ML solution Take 45 mLs (30 g total) by mouth 2 (two) times daily. Qty: 240 mL, Refills: 0    pantoprazole (PROTONIX) 40 MG tablet TAKE 1 TABLET (40 MG TOTAL) BY MOUTH 2 (TWO) TIMES DAILY. Qty: 60 tablet, Refills: 0    spironolactone (ALDACTONE) 100 MG tablet Take 0.5 tablets (50 mg total) by mouth daily. Qty: 30 tablet, Refills: 0      STOP taking these medications     nadolol (CORGARD) 40 MG tablet        Allergies  Allergen Reactions  . Rifaximin Swelling and Other (See Comments)    Reaction:  All over body swelling    Follow-up Information    [facility]. Go  on 04/02/2017.   Specialty:  Gastroenterology Why:  Please proceed to lab on basement level floor at the [facility] office for repeat CBC, CMP and INR. Please keep your follow up with [name], NP as scheduled previously. Contact information: [place] [address] [phone]       [name], MD. Schedule an appointment as soon as possible for a visit in 1 week(s).   Specialty:   Family Medicine Why:  Hospital follow up Contact information: [address] [place] [address] [phone]            The results of significant diagnostics from this hospitalization (including imaging, microbiology, ancillary and laboratory) are listed below for reference.    Significant Diagnostic Studies: Dg Chest 1 View  Result Date: 03/05/2017 CLINICAL DATA:  Right pleural effusion.  Status post thoracentesis. EXAM: CHEST 1 VIEW COMPARISON:  03/03/2017 and 12/21/2016 FINDINGS: There is only a tiny residual right pleural effusion. No pneumothorax. Heart size and pulmonary vascularity are normal. Lungs are clear. Bones appear normal. IMPRESSION: Tiny residual right pleural effusion.  No pneumothorax. Electronically Signed   By: Lorriane Shire M.D.   On: 03/05/2017 13:43   Dg Chest 2 View  Result Date: 03/27/2017 CLINICAL DATA:  Cough, sob, low hemoglobin, increased liver function at PCP today. Hx alcohol abuse, esophageal varices, GERD, htn, tubular adenoma of colon. Non smoker EXAM: CHEST  2 VIEW COMPARISON:  03/05/2017 FINDINGS: Elevation of the right hemidiaphragm. Infiltration or atelectasis present in the right lung base. This could indicate pneumonia in the appropriate clinical setting. Heart size and pulmonary vascularity are normal. Probable small right pleural effusion. No pneumothorax. Mediastinal contours appear intact. Degenerative changes in the spine. IMPRESSION: Infiltration or atelectasis in the right lung base with probable small pleural effusion. Elevation of right hemidiaphragm. This could indicate pneumonia in the appropriate clinical setting. Electronically Signed   By: Lucienne Capers M.D.   On: 03/27/2017 21:28   Dg Chest 2 View  Result Date: 03/03/2017 CLINICAL DATA:  Patient with abdominal swelling and ascites for 1 week. EXAM: CHEST  2 VIEW COMPARISON:  Chest radiograph 12/21/2016 FINDINGS: Monitoring leads overlie the patient. Stable  enlarged cardiac and mediastinal contours. Elevation of the right hemidiaphragm. Moderate right pleural effusion with underlying consolidation. Thoracic spine degenerative changes. IMPRESSION: Interval increase in size of moderate right pleural effusion with underlying consolidation. Electronically Signed   By: Lovey Newcomer M.D.   On: 03/03/2017 10:58   [place] Art/ven Flow Abd Pelv Doppler  Result Date: 03/05/2017 CLINICAL DATA:  Cirrhosis, alcoholic. Screening for hepatoma. Doppler study recommended by transplant center. EXAM: DUPLEX ULTRASOUND OF LIVER TECHNIQUE: Color and duplex Doppler ultrasound was performed to evaluate the hepatic in-flow and out-flow vessels. COMPARISON:  03/07/2016 FINDINGS: Portal Vein 0.9 cm diameter, without evidence of occlusion or thrombus. Velocities (All hepatopetal): Main:  17-26 cm/sec Right:  21 cm/sec Left:  16 cm/sec Hepatic Vein Velocities (all hepatofugal): Right:  39 cm/sec Middle:  22 cm/sec Left:  22 cm/sec Hepatic Artery Velocity:  144 cm/sec Spleen 11.4 x 13.7 x 5.8 cm (volume = 470 cm^3). Splenic Vein without evidence of occlusion or thrombus, Velocity: 20.8 cm/sec Varices: None visualized Ascites: Present, moderate IMPRESSION: 1. Unremarkable hepatic vascular Doppler evaluation. 2. Splenomegaly and ascites suggesting a degree of portal venous hypertension. Electronically Signed   By: Lucrezia Europe M.D.   On: 03/05/2017 15:49   US Paracentesis  Result Date: 03/04/2017 INDICATION: History of alcoholic cirrhosis.  Abdominal discomfort and distention. Ascites. Request diagnostic and therapeutic paracentesis. EXAM: ULTRASOUND GUIDED RIGHT LOWER QUADRANT PARACENTESIS MEDICATIONS: None. COMPLICATIONS: None immediate. PROCEDURE: Informed written consent was obtained from the patient after a discussion of the risks, benefits and alternatives to treatment. A timeout was performed prior to the initiation of the procedure. Initial ultrasound scanning demonstrates a large amount of  ascites within the right lower abdominal quadrant. The right lower abdomen was prepped and draped in the usual sterile fashion. 1% lidocaine was used for local anesthesia. Following this, a 19 gauge, 10-cm, Yueh catheter was introduced. An ultrasound image was saved for documentation purposes. The paracentesis was performed. The catheter was removed and a dressing was applied. The patient tolerated the procedure well without immediate post procedural complication. FINDINGS: A total of approximately 1.4 L of clear, amber colored fluid was removed. Samples were sent to the laboratory as requested by the clinical team. IMPRESSION: Successful ultrasound-guided paracentesis yielding 1.4 liters of peritoneal fluid. Read by: Ascencion Dike PA-C Electronically Signed   By: Markus Daft M.D.   On: 03/04/2017 12:22   US Abdomen Limited Ruq  Result Date: 03/05/2017 CLINICAL DATA:  Alcoholic cirrhosis.  Portal hypertension. EXAM: US ABDOMEN LIMITED - RIGHT UPPER QUADRANT COMPARISON:  Ultrasound dated 05/24/2015 FINDINGS: Gallbladder: The gallbladder is contracted. No gallstones or wall thickening visualized. No sonographic Murphy sign noted by sonographer. Common bile duct: Diameter: 3.8 mm, normal. Liver: Diffuse increased echogenicity with slight nodularity of the liver contour consistent with cirrhosis. Diffuse ascites. No focal lesions. IMPRESSION: No acute abnormalities.  Cirrhosis with ascites.  No focal lesions. Electronically Signed   By: Lorriane Shire M.D.   On: 03/05/2017 13:28   US Thoracentesis Asp Pleural Space W/img Guide  Result Date: 03/05/2017 INDICATION: Patient with with history of cirrhosis and recurrent ascites now with right pleural effusion. Request is made for diagnostic and therapeutic right thoracentesis. EXAM: ULTRASOUND GUIDED DIAGNOSTIC AND THERAPEUTIC RIGHT THORACENTESIS MEDICATIONS: 10 mL 1% lidocaine COMPLICATIONS: None immediate. PROCEDURE: An ultrasound guided thoracentesis was thoroughly  discussed with the patient and questions answered. The benefits, risks, alternatives and complications were also discussed. The patient understands and wishes to proceed with the procedure. Written consent was obtained. Ultrasound was performed to localize and Smith an adequate pocket of fluid in the right chest. The area was then prepped and draped in the normal sterile fashion. 1% Lidocaine was used for local anesthesia. Under ultrasound guidance a Safe-T-Centesis catheter was introduced. Thoracentesis was performed. The catheter was removed and a dressing applied. FINDINGS: A total of approximately 750 mL of blood-tinged fluid was removed. Samples were sent to the laboratory as requested by the clinical team. IMPRESSION: Successful ultrasound guided diagnostic and therapeutic right thoracentesis yielding 750 mL of pleural fluid. Read by:  Brynda Greathouse PA-C Electronically Signed   By: Sandi Mariscal M.D.   On: 03/05/2017 13:30    Microbiology: No results found for this or any previous visit (from the past 240 hour(s)).   Labs: Basic Metabolic Panel:  Recent Labs Lab 03/27/17 1625 03/28/17 0435 03/29/17 0428 03/30/17 0417  NA 138 138 140 139  K 4.3 4.2 3.9 4.2  CL 104 106 105 106  CO2 25 23 25 27   GLUCOSE 130* 95 102* 103*  BUN 23 25* 22* 18  CREATININE 2.42* 2.07* 1.53* 1.15  CALCIUM 8.1* 7.8* 8.0* 7.9*   Liver Function Tests:  Recent Labs Lab 03/27/17 2051 03/29/17 0428 03/30/17 0417  AST 70* 66* 65*  ALT 29 27  26  ALKPHOS 115 104 96  BILITOT 5.3* 7.4* 7.7*  PROT 5.9* 5.6* 5.7*  ALBUMIN 2.2* 2.1* 2.0*   No results for input(s): LIPASE, AMYLASE in the last 168 hours. No results for input(s): AMMONIA in the last 168 hours. CBC:  Recent Labs Lab 03/27/17 1625 03/28/17 0435 03/28/17 2007 03/29/17 0428 03/29/17 0651 03/30/17 0417  WBC 9.6 7.0  --  6.6  --  6.7  NEUTROABS 5.4  --   --   --   --   --   HGB 6.2 Repeated and verified X2.* 6.5* 9.3* 9.1* 8.8* 9.0*  HCT  18.3 Repeated and verified X2.* 18.8* 26.5* 27.0*  --  25.9*  MCV 118.3* 104.4*  --  95.4  --  98.5  PLT 121.0* 85*  --  78*  --  72*   Cardiac Enzymes:  Recent Labs Lab 03/27/17 2051  TROPONINI <0.03   BNP: BNP (last 3 results)  Recent Labs  03/27/17 2051  BNP 106.7*    ProBNP (last 3 results) No results for input(s): PROBNP in the last 8760 hours.  CBG: No results for input(s): GLUCAP in the last 168 hours.     SignedCristal Ford  [facility] 03/30/2017, 10:22 AM

## 2017-03-30 NOTE — Discharge Instructions (Signed)
Anemia, Nonspecific Anemia is a condition in which the concentration of red blood cells or hemoglobin in the blood is below normal. Hemoglobin is a substance in red blood cells that carries oxygen to the tissues of the body. Anemia results in not enough oxygen reaching these tissues. What are the causes? Common causes of anemia include:  Excessive bleeding. Bleeding may be internal or external. This includes excessive bleeding from periods (in women) or from the intestine.  Poor nutrition.  Chronic kidney, thyroid, and liver disease.  Bone marrow disorders that decrease red blood cell production.  Cancer and treatments for cancer.  HIV, AIDS, and their treatments.  Spleen problems that increase red blood cell destruction.  Blood disorders.  Excess destruction of red blood cells due to infection, medicines, and autoimmune disorders. What are the signs or symptoms?  Minor weakness.  Dizziness.  Headache.  Palpitations.  [name], especially with exercise.  Paleness.  Cold sensitivity.  Indigestion.  Nausea.  Difficulty sleeping.  Difficulty concentrating. Symptoms may occur suddenly or they may develop slowly. How is this diagnosed? Additional blood tests are often needed. These help your health care provider determine the best treatment. Your health care provider will check your stool for blood and look for other causes of blood loss. How is this treated? Treatment varies depending on the cause of the anemia. Treatment can include:  Supplements of iron, vitamin B12, or folic acid.  Hormone medicines.  A blood transfusion. This may be needed if blood loss is severe.  Hospitalization. This may be needed if there is significant continual blood loss.  Dietary changes.  Spleen removal. Follow these instructions at home: Keep all follow-up appointments. It often takes many weeks to correct anemia, and having your health care provider check on your  condition and your response to treatment is very important. Get help right away if:  You develop extreme weakness, shortness of breath, or chest pain.  You become dizzy or have trouble concentrating.  You develop heavy vaginal bleeding.  You develop a rash.  You have bloody or black, tarry stools.  You faint.  You vomit up blood.  You vomit repeatedly.  You have abdominal pain.  You have a fever or persistent symptoms for more than 2-3 days.  You have a fever and your symptoms suddenly get worse.  You are dehydrated. This information is not intended to replace advice given to you by your health care provider. Make sure you discuss any questions you have with your health care provider. Document Released: 01/04/2005 Document Revised: 05/10/2016 Document Reviewed: 05/23/2013 Elsevier Interactive Patient Education  2017 Elsevier Inc.  

## 2017-04-02 ENCOUNTER — Other Ambulatory Visit (INDEPENDENT_AMBULATORY_CARE_PROVIDER_SITE_OTHER): Payer: BLUE CROSS/BLUE SHIELD

## 2017-04-02 DIAGNOSIS — K7460 Unspecified cirrhosis of liver: Secondary | ICD-10-CM | POA: Diagnosis not present

## 2017-04-02 LAB — PROTIME-INR
INR: 2.3 ratio — ABNORMAL HIGH (ref 0.8–1.0)
PROTHROMBIN TIME: 24.3 s — AB (ref 9.6–13.1)

## 2017-04-02 LAB — CBC WITH DIFFERENTIAL/PLATELET
BASOS ABS: 0.1 10*3/uL (ref 0.0–0.1)
BASOS PCT: 0.7 % (ref 0.0–3.0)
EOS ABS: 0.4 10*3/uL (ref 0.0–0.7)
Eosinophils Relative: 5.2 % — ABNORMAL HIGH (ref 0.0–5.0)
Hemoglobin: 9.2 g/dL — ABNORMAL LOW (ref 13.0–17.0)
LYMPHS PCT: 13.2 % (ref 12.0–46.0)
Lymphs Abs: 1 10*3/uL (ref 0.7–4.0)
MCHC: 34.5 g/dL (ref 30.0–36.0)
MCV: 102.9 fl — ABNORMAL HIGH (ref 78.0–100.0)
MONO ABS: 1.9 10*3/uL — AB (ref 0.1–1.0)
Monocytes Relative: 23.5 % — ABNORMAL HIGH (ref 3.0–12.0)
NEUTROS PCT: 57.3 % (ref 43.0–77.0)
Neutro Abs: 4.5 10*3/uL (ref 1.4–7.7)
PLATELETS: NORMAL 10*3/uL — AB (ref 150.0–400.0)
RDW: 26.9 % — AB (ref 11.5–15.5)
WBC: 7.9 10*3/uL (ref 4.0–10.5)

## 2017-04-02 LAB — COMPREHENSIVE METABOLIC PANEL
ALT: 23 U/L (ref 0–53)
AST: 60 U/L — AB (ref 0–37)
Albumin: 2.4 g/dL — ABNORMAL LOW (ref 3.5–5.2)
Alkaline Phosphatase: 113 U/L (ref 39–117)
BILIRUBIN TOTAL: 6.4 mg/dL — AB (ref 0.2–1.2)
BUN: 11 mg/dL (ref 6–23)
CALCIUM: 8 mg/dL — AB (ref 8.4–10.5)
CHLORIDE: 105 meq/L (ref 96–112)
CO2: 28 meq/L (ref 19–32)
CREATININE: 1.33 mg/dL (ref 0.40–1.50)
GFR: 59.37 mL/min — ABNORMAL LOW (ref >60.00)
Glucose, Bld: 104 mg/dL — ABNORMAL HIGH (ref 70–99)
Potassium: 4.2 mEq/L (ref 3.5–5.1)
SODIUM: 140 meq/L (ref 135–145)
Total Protein: 6.1 g/dL (ref 6.0–8.3)

## 2017-04-03 ENCOUNTER — Ambulatory Visit (INDEPENDENT_AMBULATORY_CARE_PROVIDER_SITE_OTHER): Payer: BLUE CROSS/BLUE SHIELD | Admitting: Nurse Practitioner

## 2017-04-03 ENCOUNTER — Encounter: Payer: Self-pay | Admitting: Nurse Practitioner

## 2017-04-03 VITALS — BP 130/60 | HR 96 | Ht 65.0 in | Wt 199.0 lb

## 2017-04-03 DIAGNOSIS — N179 Acute kidney failure, unspecified: Secondary | ICD-10-CM | POA: Diagnosis not present

## 2017-04-03 DIAGNOSIS — R188 Other ascites: Secondary | ICD-10-CM | POA: Diagnosis not present

## 2017-04-03 DIAGNOSIS — K7460 Unspecified cirrhosis of liver: Secondary | ICD-10-CM | POA: Diagnosis not present

## 2017-04-03 NOTE — Telephone Encounter (Signed)
Pt had lab work and will see Nevin Bloodgood today

## 2017-04-03 NOTE — Progress Notes (Addendum)
HPI: Thomas [name] is a 56 yo male with ETOH cirrhosis, recently hospitalized with AKI and anemia. He was transfused 3 units of blood with rise in hgb from 6.5 to 9. He had no overt bleeding to correlate with decline in hgb. Inpatient EGD revealed Grade I varices and portal HTN. Nadolol was stopped because of deterioration in renal function. Renal function improved, discharged home on aldactone 50mg  daily and 40mg  daily of lasix. Per his scale patient has gained 5-6 pounds since discharge  4 days ago. He watches salt intake closely. States last ETOH wa 4 months ago but ETOH level on 4/17 was 215.  He now recalls drinking some over-the-counter sleep aid with alcohol prior to admission   Past Medical History:  Diagnosis Date  . Alcohol abuse 2013  . Alcoholic cirrhosis ([place]) 02/3353  . Anemia 02/2016   in setting of GI blood loss, but MCV macrocytic.   . Coagulopathy ([place]) 05/2015  . Diverticulosis 09/2015  . Esophageal varices ([place]) 09/2015   small  . GERD (gastroesophageal reflux disease)   . Hyperlipidemia   . Hypertension   . Portal hypertension ([place]) 09/2015   with portal gastropathy  . Thrombocytopenia ([place]) 02/2016  . Tubular adenoma of colon 09/2015    Patient's surgical history, family medical history, social history, medications and allergies were all reviewed in Epic    Physical Exam: BP 130/60   Pulse 96   Ht 5\' 5"  (1.651 m)   Wt 199 lb (90.3 kg)   BMI 33.12 kg/m   GENERAL: white male in NAD PSYCH: :Pleasant, cooperative, normal affect EENT:  conjunctiva pink, mucous membranes moist, neck supple without masses CARDIAC:  RRR, bilateral LE pitting edema PULM: Normal respiratory effort, lungs CTA bilaterally, no wheezing ABDOMEN:  soft, distended, +ascites with dull flanks,  normal bowel sounds SKIN:  turgor, no lesions seen Musculoskeletal:  Normal muscle tone, normal strength NEURO: Alert and oriented x 3, no focal neurologic deficits  ASSESSMENT and PLAN:   56 yo  male with decompensated ETOH cirrhosis, MELD currently 25. He has ascites,  hx of SBP, grade I varices, coagulopathy and thrombocytopenia. Hospitalized a few days ago with AKI and severe anemia with fall in hgb from 7.3 to 6.2.  Small varices and portal hypertensive gastropathy on EGD in late March with same findings on recent inpatient EGD. Nadolol held due to deterioration in renal function.   Recent alpha-fetoprotein around 16, no focal liver lesions on ultrasound late March . Patient evaluated by [facility] for transplant but not candidate as of mid December because of the Substance Abuse Counseling requirement.  -hgb up to 9.2 after 3 recent units of blood. Repeat today and make sure stable.  -ETOH avoidance (including  Medications containing ETOH) is paramount to transplant evaluation.  -AKI has resolved. On low dose diuretics but gaining weight by home scales, + pitting edema, + ascites on exam. Suspect paracentesis not done in the hospital because of AKI.  Breathing not impaired, will hold off on paracentesis for the time being. -INR 2.3, stable. Thrombocytopenia, slightly worse at 66 -No NSAIDs.  -Strict 2000 mg sodium diet  - increase diuretics from Lasix 40 mg daily to Lasix 60 mg daily. Will increase Aldactone from 50 mg to 100 mg daily. I've asked him to come for labs BMET, CBC on Friday. Continue daily weights -continue Cipro for hx of SBP -We probably need to look at referring him for Transplant evaluation. Will check with his primary GI, Dr.  Pyrtle about this. Maybe [facility]? He will follow up with Thomas [name] early June.    Thomas [name] , NP 04/03/2017, 3:03 PM  Addendum: Reviewed and agree with management. Discussed with Thomas Savoy, NP. Patient in the past has been evasive about alcohol use and though I have not seen him recently, it is my impression, that he remains evasive about ongoing alcohol use. He has severely decompensated liver disease and is very high risk for complication and  death. Agree with referral to [facility] hepatology as the patient feels like [facility] is not the best practice for him based on prior interactions. We are closely monitoring electrolytes and titrating diuretics as tolerated He needs close follow-up Thomas Bears, MD

## 2017-04-03 NOTE — Patient Instructions (Signed)
If you are age 55 or older, your body mass index should be between 23-30. Your Body mass index is 33.12 kg/m. If this is out of the aforementioned range listed, please consider follow up with your Primary Care Provider.  If you are age 41 or younger, your body mass index should be between 19-25. Your Body mass index is 33.12 kg/m. If this is out of the aformentioned range listed, please consider follow up with your Primary Care Provider.   Your physician has requested that you go to the basement for the lab work on Friday, April 06, 2017.  NO NSAIDS. Such as Ibuprofen, Motrin, Aleve, Naproxen etc.  NO Alcohol or medications containing alcohol.  INCREASE Lasix to 60 mg every morning.  INCREASE Aldactone to 100 mg every day.  STRICT 2000 mg sodium diet.  Follow up appointment on 05/14/17 at 2 00 pm with Dr. Hilarie Fredrickson.  Thank you for choosing me and [facility].  [name], NP

## 2017-04-06 NOTE — Addendum Note (Signed)
Addended by: [name] on: 04/06/2017 11:08 AM   Modules accepted: Level of Service

## 2017-04-09 ENCOUNTER — Telehealth: Payer: Self-pay

## 2017-04-09 DIAGNOSIS — D539 Nutritional anemia, unspecified: Secondary | ICD-10-CM | POA: Diagnosis not present

## 2017-04-09 DIAGNOSIS — K3189 Other diseases of stomach and duodenum: Secondary | ICD-10-CM | POA: Diagnosis not present

## 2017-04-09 DIAGNOSIS — I10 Essential (primary) hypertension: Secondary | ICD-10-CM | POA: Diagnosis not present

## 2017-04-09 DIAGNOSIS — K7460 Unspecified cirrhosis of liver: Secondary | ICD-10-CM

## 2017-04-09 NOTE — Telephone Encounter (Signed)
[name] , NP 04/03/2017, 3:03 PM  Addendum: Reviewed and agree with management. Discussed with [name], NP. Patient in the past has been evasive about alcohol use and though I have not seen him recently, it is my impression, that he remains evasive about ongoing alcohol use. He has severely decompensated liver disease and is very high risk for complication and death. Agree with referral to [facility] hepatology as the patient feels like [facility] is not the best practice for him based on prior interactions. We are closely monitoring electrolytes and titrating diuretics as tolerated He needs close follow-up [name], MD

## 2017-04-10 NOTE — Telephone Encounter (Signed)
Referral was sent to [facility] hepatology last week. Called [facility] to check on referral and they state he will be called today with an appt.

## 2017-04-25 ENCOUNTER — Other Ambulatory Visit (INDEPENDENT_AMBULATORY_CARE_PROVIDER_SITE_OTHER): Payer: BLUE CROSS/BLUE SHIELD

## 2017-04-25 DIAGNOSIS — N179 Acute kidney failure, unspecified: Secondary | ICD-10-CM | POA: Diagnosis not present

## 2017-04-25 DIAGNOSIS — K7460 Unspecified cirrhosis of liver: Secondary | ICD-10-CM | POA: Diagnosis not present

## 2017-04-25 DIAGNOSIS — R188 Other ascites: Secondary | ICD-10-CM

## 2017-04-25 LAB — BASIC METABOLIC PANEL
BUN: 12 mg/dL (ref 6–23)
CO2: 29 mEq/L (ref 19–32)
Calcium: 7.2 mg/dL — ABNORMAL LOW (ref 8.4–10.5)
Chloride: 91 mEq/L — ABNORMAL LOW (ref 96–112)
Creatinine, Ser: 1.55 mg/dL — ABNORMAL HIGH (ref 0.40–1.50)
GFR: 49.74 mL/min — ABNORMAL LOW (ref >60.00)
Glucose, Bld: 216 mg/dL — ABNORMAL HIGH (ref 70–99)
Potassium: 3.1 mEq/L — ABNORMAL LOW (ref 3.5–5.1)
Sodium: 132 mEq/L — ABNORMAL LOW (ref 135–145)

## 2017-04-25 LAB — CBC
HCT: 19.4 % — CL (ref 39.0–52.0)
Hemoglobin: 6.5 g/dL — CL (ref 13.0–17.0)
MCHC: 33.6 g/dL (ref 30.0–36.0)
MCV: 122.2 fl — ABNORMAL HIGH (ref 78.0–100.0)
Platelets: 91 10*3/uL — ABNORMAL LOW (ref 150.0–400.0)
RBC: 1.58 Mil/uL — ABNORMAL LOW (ref 4.22–5.81)
RDW: 20.4 % — ABNORMAL HIGH (ref 11.5–15.5)
WBC: 8.8 10*3/uL (ref 4.0–10.5)

## 2017-04-26 ENCOUNTER — Other Ambulatory Visit: Payer: Self-pay

## 2017-04-26 ENCOUNTER — Telehealth: Payer: Self-pay

## 2017-04-26 LAB — ETHANOL: Ethanol: NEGATIVE %

## 2017-04-26 NOTE — Telephone Encounter (Signed)
Received a call from [facility]. They have received extra tubes from [employer]. The requisition has Ethanol marked as the only test. Patient was supposed to have had this test done in April. He was seen 04/03/17. I can not find any notes in his chart that indicated he was to wait one month or that he was to have additional testing. [facility] will run ethanol test only.

## 2017-04-27 ENCOUNTER — Emergency Department (HOSPITAL_COMMUNITY): Payer: BLUE CROSS/BLUE SHIELD

## 2017-04-27 ENCOUNTER — Inpatient Hospital Stay (HOSPITAL_COMMUNITY)
Admission: EM | Admit: 2017-04-27 | Discharge: 2017-04-30 | DRG: 070 | Disposition: A | Discharge status: Home / Self Care | Payer: BLUE CROSS/BLUE SHIELD | Attending: Family Medicine | Admitting: Family Medicine

## 2017-04-27 ENCOUNTER — Encounter (HOSPITAL_COMMUNITY): Payer: Self-pay

## 2017-04-27 DIAGNOSIS — E785 Hyperlipidemia, unspecified: Secondary | ICD-10-CM | POA: Diagnosis not present

## 2017-04-27 DIAGNOSIS — Z79899 Other long term (current) drug therapy: Secondary | ICD-10-CM

## 2017-04-27 DIAGNOSIS — Z792 Long term (current) use of antibiotics: Secondary | ICD-10-CM | POA: Diagnosis not present

## 2017-04-27 DIAGNOSIS — Z66 Do not resuscitate: Secondary | ICD-10-CM | POA: Diagnosis not present

## 2017-04-27 DIAGNOSIS — R4182 Altered mental status, unspecified: Secondary | ICD-10-CM

## 2017-04-27 DIAGNOSIS — D6959 Other secondary thrombocytopenia: Secondary | ICD-10-CM | POA: Diagnosis present

## 2017-04-27 DIAGNOSIS — J189 Pneumonia, unspecified organism: Secondary | ICD-10-CM | POA: Diagnosis not present

## 2017-04-27 DIAGNOSIS — K921 Melena: Secondary | ICD-10-CM | POA: Diagnosis not present

## 2017-04-27 DIAGNOSIS — R05 Cough: Secondary | ICD-10-CM | POA: Diagnosis not present

## 2017-04-27 DIAGNOSIS — K7031 Alcoholic cirrhosis of liver with ascites: Secondary | ICD-10-CM | POA: Diagnosis present

## 2017-04-27 DIAGNOSIS — Z888 Allergy status to other drugs, medicaments and biological substances status: Secondary | ICD-10-CM

## 2017-04-27 DIAGNOSIS — K766 Portal hypertension: Secondary | ICD-10-CM | POA: Diagnosis not present

## 2017-04-27 DIAGNOSIS — I8500 Esophageal varices without bleeding: Secondary | ICD-10-CM | POA: Diagnosis not present

## 2017-04-27 DIAGNOSIS — Y95 Nosocomial condition: Secondary | ICD-10-CM | POA: Diagnosis present

## 2017-04-27 DIAGNOSIS — K922 Gastrointestinal hemorrhage, unspecified: Secondary | ICD-10-CM | POA: Diagnosis present

## 2017-04-27 DIAGNOSIS — R0902 Hypoxemia: Secondary | ICD-10-CM | POA: Diagnosis present

## 2017-04-27 DIAGNOSIS — G9341 Metabolic encephalopathy: Principal | ICD-10-CM | POA: Diagnosis present

## 2017-04-27 DIAGNOSIS — D62 Acute posthemorrhagic anemia: Secondary | ICD-10-CM | POA: Diagnosis present

## 2017-04-27 DIAGNOSIS — T502X5A Adverse effect of carbonic-anhydrase inhibitors, benzothiadiazides and other diuretics, initial encounter: Secondary | ICD-10-CM | POA: Diagnosis present

## 2017-04-27 DIAGNOSIS — K219 Gastro-esophageal reflux disease without esophagitis: Secondary | ICD-10-CM | POA: Diagnosis not present

## 2017-04-27 DIAGNOSIS — D638 Anemia in other chronic diseases classified elsewhere: Secondary | ICD-10-CM | POA: Diagnosis present

## 2017-04-27 DIAGNOSIS — I129 Hypertensive chronic kidney disease with stage 1 through stage 4 chronic kidney disease, or unspecified chronic kidney disease: Secondary | ICD-10-CM | POA: Diagnosis not present

## 2017-04-27 DIAGNOSIS — D689 Coagulation defect, unspecified: Secondary | ICD-10-CM | POA: Diagnosis not present

## 2017-04-27 DIAGNOSIS — K3189 Other diseases of stomach and duodenum: Secondary | ICD-10-CM | POA: Diagnosis present

## 2017-04-27 DIAGNOSIS — N179 Acute kidney failure, unspecified: Secondary | ICD-10-CM | POA: Diagnosis present

## 2017-04-27 DIAGNOSIS — R188 Other ascites: Secondary | ICD-10-CM | POA: Diagnosis not present

## 2017-04-27 DIAGNOSIS — K7290 Hepatic failure, unspecified without coma: Secondary | ICD-10-CM | POA: Diagnosis present

## 2017-04-27 DIAGNOSIS — E876 Hypokalemia: Secondary | ICD-10-CM | POA: Diagnosis present

## 2017-04-27 DIAGNOSIS — R410 Disorientation, unspecified: Secondary | ICD-10-CM | POA: Diagnosis not present

## 2017-04-27 DIAGNOSIS — N183 Chronic kidney disease, stage 3 (moderate): Secondary | ICD-10-CM | POA: Diagnosis present

## 2017-04-27 DIAGNOSIS — G92 Toxic encephalopathy: Secondary | ICD-10-CM | POA: Diagnosis not present

## 2017-04-27 DIAGNOSIS — F1010 Alcohol abuse, uncomplicated: Secondary | ICD-10-CM | POA: Diagnosis present

## 2017-04-27 DIAGNOSIS — Z833 Family history of diabetes mellitus: Secondary | ICD-10-CM | POA: Diagnosis not present

## 2017-04-27 DIAGNOSIS — D696 Thrombocytopenia, unspecified: Secondary | ICD-10-CM | POA: Diagnosis present

## 2017-04-27 DIAGNOSIS — D500 Iron deficiency anemia secondary to blood loss (chronic): Secondary | ICD-10-CM | POA: Diagnosis present

## 2017-04-27 DIAGNOSIS — G9340 Encephalopathy, unspecified: Secondary | ICD-10-CM | POA: Diagnosis not present

## 2017-04-27 LAB — COMPREHENSIVE METABOLIC PANEL
ALK PHOS: 132 U/L — AB (ref 38–126)
ALT: 33 U/L (ref 17–63)
ANION GAP: 12 (ref 5–15)
AST: 77 U/L — ABNORMAL HIGH (ref 15–41)
Albumin: 2.5 g/dL — ABNORMAL LOW (ref 3.5–5.0)
BILIRUBIN TOTAL: 7 mg/dL — AB (ref 0.3–1.2)
BUN: 11 mg/dL (ref 6–20)
CALCIUM: 7.5 mg/dL — AB (ref 8.9–10.3)
CO2: 30 mmol/L (ref 22–32)
CREATININE: 1.51 mg/dL — AB (ref 0.61–1.24)
Chloride: 92 mmol/L — ABNORMAL LOW (ref 101–111)
GFR, EST AFRICAN AMERICAN: 59 mL/min — AB (ref >60)
GFR, EST NON AFRICAN AMERICAN: 51 mL/min — AB (ref >60)
Glucose, Bld: 128 mg/dL — ABNORMAL HIGH (ref 65–99)
Potassium: 2.9 mmol/L — ABNORMAL LOW (ref 3.5–5.1)
Sodium: 134 mmol/L — ABNORMAL LOW (ref 135–145)
TOTAL PROTEIN: 6.1 g/dL — AB (ref 6.5–8.1)

## 2017-04-27 LAB — CBC
HCT: 20.2 % — ABNORMAL LOW (ref 39.0–52.0)
HEMATOCRIT: 26.8 % — AB (ref 39.0–52.0)
HEMOGLOBIN: 6.9 g/dL — AB (ref 13.0–17.0)
Hemoglobin: 8.9 g/dL — ABNORMAL LOW (ref 13.0–17.0)
MCH: 40.4 pg — AB (ref 26.0–34.0)
MCH: 37.1 pg — AB (ref 26.0–34.0)
MCHC: 34.2 g/dL (ref 30.0–36.0)
MCHC: 33.2 g/dL (ref 30.0–36.0)
MCV: 118.1 fL — ABNORMAL HIGH (ref 78.0–100.0)
MCV: 111.7 fL — AB (ref 78.0–100.0)
PLATELETS: 100 10*3/uL — AB (ref 150–400)
PLATELETS: 101 10*3/uL — AB (ref 150–400)
RBC: 1.71 MIL/uL — AB (ref 4.22–5.81)
RBC: 2.4 MIL/uL — ABNORMAL LOW (ref 4.22–5.81)
RDW: 18.9 % — ABNORMAL HIGH (ref 11.5–15.5)
RDW: 24.2 % — AB (ref 11.5–15.5)
WBC: 8.6 10*3/uL (ref 4.0–10.5)
WBC: 12.6 10*3/uL — ABNORMAL HIGH (ref 4.0–10.5)

## 2017-04-27 LAB — PREPARE RBC (CROSSMATCH)

## 2017-04-27 LAB — CBG MONITORING, ED: Glucose-Capillary: 126 mg/dL — ABNORMAL HIGH (ref 65–99)

## 2017-04-27 LAB — POC OCCULT BLOOD, ED: FECAL OCCULT BLD: POSITIVE — AB

## 2017-04-27 LAB — AMMONIA: AMMONIA: 87 umol/L — AB (ref 9–35)

## 2017-04-27 LAB — ETHANOL: Alcohol, Ethyl (B): <5 mg/dL (ref <5)

## 2017-04-27 LAB — MRSA PCR SCREENING: MRSA BY PCR: NEGATIVE

## 2017-04-27 LAB — PROTIME-INR
INR: 2.58
PROTHROMBIN TIME: 28.2 s — AB (ref 11.4–15.2)

## 2017-04-27 MED ORDER — VANCOMYCIN HCL IN DEXTROSE 750-5 MG/150ML-% IV SOLN
750.0000 mg | Freq: Two times a day (BID) | INTRAVENOUS | Status: DC
  Filled 2017-04-27: qty 150

## 2017-04-27 MED ORDER — VITAMIN K1 10 MG/ML IJ SOLN
10.0000 mg | Freq: Every day | INTRAVENOUS | Status: AC
  Administered 2017-04-27 – 2017-04-28 (×2): 10 mg via INTRAVENOUS
  Filled 2017-04-27 (×3): qty 1

## 2017-04-27 MED ORDER — PANTOPRAZOLE SODIUM 40 MG IV SOLR
40.0000 mg | Freq: Two times a day (BID) | INTRAVENOUS | Status: DC
  Administered 2017-04-27 – 2017-04-30 (×7): 40 mg via INTRAVENOUS
  Filled 2017-04-27 (×7): qty 40

## 2017-04-27 MED ORDER — CEFEPIME HCL 2 G IJ SOLR: 2.0000 g | Freq: Once | INTRAMUSCULAR | Status: DC

## 2017-04-27 MED ORDER — PIPERACILLIN-TAZOBACTAM 3.375 G IVPB
3.3750 g | Freq: Three times a day (TID) | INTRAVENOUS | Status: DC
  Administered 2017-04-27 – 2017-04-30 (×9): 3.375 g via INTRAVENOUS
  Filled 2017-04-27 (×12): qty 50

## 2017-04-27 MED ORDER — OCTREOTIDE LOAD VIA INFUSION
50.0000 ug | Freq: Once | INTRAVENOUS | Status: AC
  Administered 2017-04-27: 50 ug via INTRAVENOUS
  Filled 2017-04-27: qty 25

## 2017-04-27 MED ORDER — ONDANSETRON HCL 4 MG PO TABS: 4.0000 mg | ORAL_TABLET | Freq: Four times a day (QID) | ORAL | Status: DC | PRN

## 2017-04-27 MED ORDER — SODIUM CHLORIDE 0.9 % IV SOLN
INTRAVENOUS | Status: DC
  Administered 2017-04-27 – 2017-04-29 (×3): via INTRAVENOUS

## 2017-04-27 MED ORDER — LACTULOSE 10 GM/15ML PO SOLN
30.0000 g | Freq: Three times a day (TID) | ORAL | Status: DC
  Administered 2017-04-27 – 2017-04-30 (×7): 30 g via ORAL
  Filled 2017-04-27 (×7): qty 45

## 2017-04-27 MED ORDER — ALBUTEROL SULFATE (2.5 MG/3ML) 0.083% IN NEBU: 2.5000 mg | INHALATION_SOLUTION | RESPIRATORY_TRACT | Status: DC | PRN

## 2017-04-27 MED ORDER — FUROSEMIDE 40 MG PO TABS
60.0000 mg | ORAL_TABLET | Freq: Every day | ORAL | Status: DC
  Administered 2017-04-28: 60 mg via ORAL
  Filled 2017-04-27: qty 1

## 2017-04-27 MED ORDER — LORAZEPAM 2 MG/ML IJ SOLN
1.0000 mg | Freq: Four times a day (QID) | INTRAMUSCULAR | Status: DC | PRN
  Administered 2017-04-27: 1 mg via INTRAVENOUS
  Filled 2017-04-27: qty 1

## 2017-04-27 MED ORDER — SODIUM CHLORIDE 0.9% FLUSH
3.0000 mL | Freq: Two times a day (BID) | INTRAVENOUS | Status: DC
  Administered 2017-04-28 – 2017-04-30 (×4): 3 mL via INTRAVENOUS

## 2017-04-27 MED ORDER — SODIUM CHLORIDE 0.9 % IV SOLN
50.0000 ug/h | INTRAVENOUS | Status: DC
  Administered 2017-04-27 – 2017-04-29 (×4): 50 ug/h via INTRAVENOUS
  Filled 2017-04-27 (×10): qty 1

## 2017-04-27 MED ORDER — SODIUM CHLORIDE 0.9 % IV SOLN
Freq: Once | INTRAVENOUS | Status: AC
  Administered 2017-04-27: 21:00:00 via INTRAVENOUS

## 2017-04-27 MED ORDER — FOLIC ACID 1 MG PO TABS
1.0000 mg | ORAL_TABLET | Freq: Every day | ORAL | Status: DC
  Administered 2017-04-28 – 2017-04-29 (×2): 1 mg via ORAL
  Filled 2017-04-27 (×2): qty 1

## 2017-04-27 MED ORDER — LACTULOSE ENEMA
300.0000 mL | Freq: Once | ORAL | Status: AC
  Administered 2017-04-27: 300 mL via RECTAL
  Filled 2017-04-27: qty 300

## 2017-04-27 MED ORDER — PIPERACILLIN-TAZOBACTAM 3.375 G IVPB 30 MIN
3.3750 g | Freq: Once | INTRAVENOUS | Status: AC
Start: 2017-04-27 — End: 2017-04-27
  Administered 2017-04-27: 3.375 g via INTRAVENOUS
  Filled 2017-04-27: qty 50

## 2017-04-27 MED ORDER — SODIUM CHLORIDE 0.9 % IV SOLN
Freq: Once | INTRAVENOUS | Status: AC
  Administered 2017-04-27: 11:00:00 via INTRAVENOUS

## 2017-04-27 MED ORDER — PIPERACILLIN-TAZOBACTAM IN DEX 2-0.25 GM/50ML IV SOLN: 2.2500 g | Freq: Four times a day (QID) | INTRAVENOUS | Status: DC

## 2017-04-27 MED ORDER — POTASSIUM CHLORIDE 10 MEQ/100ML IV SOLN
10.0000 meq | INTRAVENOUS | Status: AC
  Administered 2017-04-27 (×3): 10 meq via INTRAVENOUS
  Filled 2017-04-27 (×3): qty 100

## 2017-04-27 MED ORDER — VANCOMYCIN HCL 10 G IV SOLR
1500.0000 mg | Freq: Once | INTRAVENOUS | Status: AC
  Administered 2017-04-27: 1500 mg via INTRAVENOUS
  Filled 2017-04-27: qty 1500

## 2017-04-27 MED ORDER — ONDANSETRON HCL 4 MG/2ML IJ SOLN
4.0000 mg | Freq: Four times a day (QID) | INTRAMUSCULAR | Status: DC | PRN
  Administered 2017-04-27: 4 mg via INTRAVENOUS
  Filled 2017-04-27: qty 2

## 2017-04-27 MED ORDER — SPIRONOLACTONE 100 MG PO TABS
100.0000 mg | ORAL_TABLET | Freq: Every day | ORAL | Status: DC
  Administered 2017-04-28 – 2017-04-30 (×2): 100 mg via ORAL
  Filled 2017-04-27: qty 1
  Filled 2017-04-27: qty 4

## 2017-04-27 MED ORDER — LACTULOSE 10 GM/15ML PO SOLN
30.0000 g | Freq: Once | ORAL | Status: AC
  Administered 2017-04-27: 30 g via ORAL
  Filled 2017-04-27: qty 60

## 2017-04-27 NOTE — Progress Notes (Addendum)
Notified by RN that patient more lethargic, restless. Patient was subsequent seen and examined at bedside, long discussion with the patient's family.  On exam:  He is arousable-able to recognize his wife but beyond that he is still very confused. Chest: Clear to auscultation anteriorly CVS: S1-S2 slightly tachycardic Abdomen: Soft but distended. Extremities: ++ edema.  Long discussion with the patient's spouse and mother at bedside. Spouse is aware that patient has end-stage liver disease which is driving a lot of patients acute medical conditions that brought him to the hospital. Spouse is very well aware of poor overall prognoses, I offered transfer to the stepdown/ICU unit for closer monitoring, however spouse is worried about visiting hours and wants to be at patient's bedside 24/7. She would rather the patient's stay up in the fourth floor so that she could spend time with him when he is acutely sick and with poor prognoses.  I again brought up the issue of CODE STATUS-after much discussion spouse agreed that patient be a DO NOT RESUSCITATE. Subsequently we talked with the bedside RN and have agreed to the following.  #1. Try low-dose Ativan to see if we can sedate the patient while he is receiving blood products. #2. Continue all treatment measures for now. #3: Try and keep him on the fourth floor as much as possible (per family's request), however if he still is agitated or worsens any further, we will then transfer for him to the stepdown unit for closer monitoring. #4. DO NOT RESUSCITATE status in effect  I will consult palliative care-as patient has a high likelihood of further deterioration. He has underlying end-stage liver disease with very poor prognoses. If she does not improve, may need to discuss transitioning to comfort care with family.  Total time spent 45 minutes Time in 4:45 pm Time out 5:30 pm

## 2017-04-27 NOTE — Progress Notes (Signed)
Patient tolerated sandostatin dose well. RN remained in room during 5 min bolus. HR and VS remained stable.  Barbee Shropshire. Brigitte Pulse, RN

## 2017-04-27 NOTE — ED Notes (Signed)
Pt is aware that a urine sample is needed and has a urinal at the bedside 

## 2017-04-27 NOTE — ED Provider Notes (Signed)
[facility] DEPT Provider Note   CSN: [id number] Arrival date & time: 04/27/17  7793   Level V caveat secondary to patient confusion  History   Chief Complaint Chief Complaint  Patient presents with  . Altered Mental Status    HPI Cristoval Teall is a 56 y.o. male.  HPI  This is a 56 year old man with a history of alcohol abuse and alcoholic cirrhosis who presents today with confusion. He has a history of liver failure with elevated in the past. His wife states that he has a history of alcohol abuse but had stopped drinking although she has noted some occasional relapses. He was in his usual state of health last night when he went to bed at 10 PM. Around 5 AM when he awoke she noted that he seemed confused. He was trying to use his remote to turn his arm off. Over the next 3 hours she come prepared to bring him to the hospital. Not noted any lateralized weakness. She has not noted any recent head injury. He has had some coughing recently although he is not appeared to be dyspneic. She also states that recently he has had several episodes of having his arms flexed and blinking for 2-3 minutes after which he has been confused for a couple of minutes. She notes 2 episodes in the last several weeks. No history of seizures that she knows of.  Past Medical History:  Diagnosis Date  . Alcohol abuse 2013  . Alcoholic cirrhosis ([place]) 08/299  . Anemia 02/2016   in setting of GI blood loss, but MCV macrocytic.   . Coagulopathy ([place]) 05/2015  . Diverticulosis 09/2015  . Esophageal varices ([place]) 09/2015   small  . GERD (gastroesophageal reflux disease)   . Hyperlipidemia   . Hypertension   . Portal hypertension ([place]) 09/2015   with portal gastropathy  . Thrombocytopenia ([place]) 02/2016  . Tubular adenoma of colon 09/2015    Patient Active Problem List   Diagnosis Date Noted  . Acute renal failure (ARF) ([place]) 03/28/2017  . Hyperglycemia 03/28/2017  . Hypoalbuminemia 03/28/2017  . Edema  03/28/2017  . S/P thoracentesis   . Symptomatic anemia   . Dyspnea 03/03/2017  . Hypomagnesemia 11/26/2016  . Acute respiratory failure with hypoxia ([place]) 11/24/2016  . Pleural effusion associated with hepatic disorder 11/24/2016  . Hypokalemia 11/24/2016  . Alcoholic cirrhosis of liver with ascites ([place])   . Bleeding gastrointestinal   . End stage liver disease ([name])   . Upper GI bleed 03/05/2016  . Coagulopathy ([place]) 03/05/2016  . Acute kidney injury ([place]) 03/05/2016  . Hepatic encephalopathy ([place]) 03/05/2016  . Macrocytic anemia 03/05/2016  . Thrombocytopenia ([place]) 03/05/2016  . Esophageal varices ([place]) 03/05/2016  . Acute blood loss anemia 03/05/2016  . Idiopathic esophageal varices without bleeding ([place])   . Portal hypertensive gastropathy ([place])   . Encephalopathy, metabolic 92/33/0076    Past Surgical History:  Procedure Laterality Date  . ESOPHAGOGASTRODUODENOSCOPY N/A 03/05/2016   Procedure: ESOPHAGOGASTRODUODENOSCOPY (EGD);  Surgeon: Ladene Artist, MD;  Location: [name];  Service: Endoscopy;  Laterality: N/A;  . ESOPHAGOGASTRODUODENOSCOPY (EGD) WITH PROPOFOL N/A 03/29/2017   Procedure: ESOPHAGOGASTRODUODENOSCOPY (EGD) WITH PROPOFOL;  Surgeon: Doran Stabler, MD;  Location: [facility] ENDOSCOPY;  Service: Endoscopy;  Laterality: N/A;  . testicle prosthesis         Home Medications    Prior to Admission medications   Medication Sig Start Date End Date Taking? Authorizing Provider  bismuth subsalicylate (PEPTO BISMOL) 262 MG  chewable tablet Chew 262-524 mg by mouth 3 (three) times daily as needed for indigestion or diarrhea or loose stools.     [provider]  calcium carbonate (TUMS EX) 750 MG chewable tablet Chew 1-2 tablets by mouth 3 (three) times daily as needed for heartburn.     [provider]  ciprofloxacin (CIPRO) 500 MG tablet Take 500 mg by mouth at bedtime.    [provider]  folic acid (FOLVITE) 1 MG tablet Take 1 tablet (1  mg total) by mouth daily. 03/30/17   Mikhail, Velta Addison, DO  furosemide (LASIX) 40 MG tablet Take 1 tablet (40 mg total) by mouth daily. 03/07/17   Eugenie Filler, MD  lactulose (CHRONULAC) 10 GM/15ML solution Take 45 mLs (30 g total) by mouth 2 (two) times daily. 03/07/16   Mikhail, Velta Addison, DO  pantoprazole (PROTONIX) 40 MG tablet TAKE 1 TABLET (40 MG TOTAL) BY MOUTH 2 (TWO) TIMES DAILY. 03/10/16   Pyrtle, Lajuan Lines, MD  spironolactone (ALDACTONE) 100 MG tablet Take 0.5 tablets (50 mg total) by mouth daily. 03/07/17   Eugenie Filler, MD    Family History Family History  Problem Relation Age of Onset  . Diabetes Father   . Parkinson's disease Father   . Diabetes Paternal Aunt   . Glaucoma Mother     Social History Social History  Substance Use Topics  . Smoking status: Never Smoker  . Smokeless tobacco: Never Used  . Alcohol use No     Comment: Quit 2/17, relapse in 1/18 for short period of time     Allergies   Rifaximin   Review of Systems Review of Systems  Unable to perform ROS: Mental status change     Physical Exam Updated Vital Signs BP (!) 150/90 (BP Location: Left Arm)   Pulse (!) 101   Resp 19   SpO2 98%   Physical Exam  Constitutional: He appears well-developed and well-nourished. No distress.  HENT:  Head: Normocephalic and atraumatic.  Eyes: Pupils are equal, round, and reactive to light.  Neck: Normal range of motion.  Cardiovascular: Normal rate and regular rhythm.   Pulmonary/Chest: Effort normal.  Abdominal: Soft. Bowel sounds are normal.  Diffusely distended  Neurological: He is alert. He displays normal reflexes. No cranial nerve deficit. He exhibits normal muscle tone. Coordination normal.  Skin: Skin is warm. No rash noted.  Nursing note and vitals reviewed.    ED Treatments / Results  Labs (all labs ordered are listed, but only abnormal results are displayed) Labs Reviewed  CBG MONITORING, ED - Abnormal; Notable for the following:        Result Value   Glucose-Capillary 126 (*)    All other components within normal limits  COMPREHENSIVE METABOLIC PANEL  CBC  AMMONIA    EKG  EKG Interpretation None       Radiology Ct Head Wo Contrast  Result Date: 04/27/2017 CLINICAL DATA:  Confusion. EXAM: CT HEAD WITHOUT CONTRAST TECHNIQUE: Contiguous axial images were obtained from the base of the skull through the vertex without intravenous contrast. COMPARISON:  None. FINDINGS: Brain: There is low attenuation within the periventricular and subcortical white matter. Prominence of the sulci and ventricles identified compatible with brain atrophy. No abnormal extra-axial fluid collection, intracranial hemorrhage or mass noted. No acute infarction. Vascular: No hyperdense vessel or unexpected calcification. Skull: Normal. Negative for fracture or focal lesion. Sinuses/Orbits: No acute finding. Other: None. IMPRESSION: 1. No acute intracranial abnormality. 2. Chronic small vessel ischemic change  and brain atrophy. Electronically Signed   By: Kerby Moors M.D.   On: 04/27/2017 09:39   Dg Chest Port 1 View  Result Date: 04/27/2017 CLINICAL DATA:  Cough. EXAM: PORTABLE CHEST 1 VIEW COMPARISON:  Radiographs of March 27, 2017. FINDINGS: Stable cardiomediastinal silhouette. No pneumothorax is noted. Left lung is clear. Increased right basilar opacity is noted concerning for worsening atelectasis or infiltrate with associated pleural effusion. Bony thorax is unremarkable. IMPRESSION: Increased right basilar atelectasis or infiltrate with associated pleural effusion. Electronically Signed   By: Marijo Conception, M.D.   On: 04/27/2017 09:10    Procedures Procedures (including critical care time)  Medications Ordered in ED Medications - No data to display   Initial Impression / Assessment and Plan / ED Course  I have reviewed the triage vital signs and the nursing notes.  Pertinent labs & imaging results that were available during my care  of the patient were reviewed by me and considered in my medical decision making (see chart for details).    1- altered mental status- hepatic encephalopathy vs secondary to infection- ct without evidence of acute intracranial etiology.  Consider  Toxicologic etiology especially etoh- still pending 2- hepatic encephalopathy- 3-anemia- likely secondary to gi bleed- stool dark and hem positive Patient with known varices. 4- right basilar abnormality- wife reports cough- will treat for pneumonia 5- hypokalemia- will replete   Final Clinical Impressions(s) / ED Diagnoses   Final diagnoses:  None    New Prescriptions New Prescriptions   No medications on file     Pattricia Boss, MD 04/28/17 1114

## 2017-04-27 NOTE — Consult Note (Signed)
Referring Provider: Dr. Sloan Leiter Primary Care Physician:  Alroy Dust, Carlean Jews.Marlou Sa, MD Primary Gastroenterologist:  Dr. Hilarie Fredrickson  Reason for Consultation:  ETOH cirrhosis, anemia  HPI: Thomas [name] is a 56 y.o. male with ETOH cirrhosis, recently hospitalized from 4/17-4/20 with AKI and anemia. He was transfused 3 units of blood with rise in hgb from 6.5 to 9. He had no overt bleeding to correlate with decline in hgb. Inpatient EGD on 4/19 revealed Grade I varices and portal HTN. Nadolol was stopped because of deterioration in renal function. Renal function improved, discharged home on aldactone 50mg  daily and 40mg  daily of lasix.  Takes cipro 500 mg daily for SBP prophylaxis.  Takes lactulose 45 mL's BID for HE.  Wife says that he is compliant with his meds as far as she is aware, but says that he did skip his lactulose last night before bed.  He was very drowsy, lethargic, confused this AM so she brought him to [facility] ED.  Ammonia level is 87.  Hgb is 6.9 grams; he is receiving PRBC's.  Potassium is 2.9, Ct 1.51, BUN 11, total bili 7.0, AST 77, ALT 33, ALP 132.  His wife says that he has not said anything to her about black or bloody stools, but she saw a BM that he had early this AM and it was very black.  Says that he has not been complaining of abdominal pain, but abdomen is distended.  He does not weigh himself at home regularly.  She says that he has been very tired.  Has been coughing a lot.  On antibiotics here for PNA now.  Past Medical History:  Diagnosis Date  . Alcohol abuse 2013  . Alcoholic cirrhosis ([place]) 12/6107  . Anemia 02/2016   in setting of GI blood loss, but MCV macrocytic.   . Coagulopathy ([place]) 05/2015  . Diverticulosis 09/2015  . 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 GERD (gastroesophageal reflux disease)   . Hyperlipidemia   . Hypertension   . Portal hypertension ([place]) 09/2015   with portal gastropathy  . Thrombocytopenia ([place]) 02/2016  . Tubular adenoma of colon 09/2015      Past Surgical History:  Procedure Laterality Date  . ESOPHAGOGASTRODUODENOSCOPY N/A 03/05/2016   Procedure: ESOPHAGOGASTRODUODENOSCOPY (EGD);  Surgeon: Ladene Artist, MD;  Location: [name];  Service: Endoscopy;  Laterality: N/A;  . ESOPHAGOGASTRODUODENOSCOPY (EGD) WITH PROPOFOL N/A 03/29/2017   Procedure: ESOPHAGOGASTRODUODENOSCOPY (EGD) WITH PROPOFOL;  Surgeon: Doran Stabler, MD;  Location: [facility];  Service: Endoscopy;  Laterality: N/A;  . testicle prosthesis      Prior to Admission medications   Medication Sig Start Date End Date Taking? Authorizing Provider  bismuth subsalicylate (PEPTO BISMOL) 262 MG chewable tablet Chew 262-524 mg by mouth 3 (three) times daily as needed for indigestion or diarrhea or loose stools.    Yes [provider]  calcium carbonate (TUMS EX) 750 MG chewable tablet Chew 1-2 tablets by mouth 3 (three) times daily as needed for heartburn.    Yes [provider]  ciprofloxacin (CIPRO) 500 MG tablet Take 500 mg by mouth at bedtime.   Yes [provider]  folic acid (FOLVITE) 1 MG tablet Take 1 tablet (1 mg total) by mouth daily. Patient taking differently: Take 1 mg by mouth at bedtime.  03/30/17  Yes Mikhail, Velta Addison, DO  furosemide (LASIX) 40 MG tablet Take 1 tablet (40 mg total) by mouth daily. Patient taking differently: Take 60 mg by mouth daily.  03/07/17  Yes Eugenie Filler, MD  lactulose (CHRONULAC) 10 GM/15ML solution Take 45 mLs (30 g total) by mouth 2 (two) times daily. 03/07/16  Yes Mikhail, Maryann, DO  pantoprazole (PROTONIX) 40 MG tablet TAKE 1 TABLET (40 MG TOTAL) BY MOUTH 2 (TWO) TIMES DAILY. 03/10/16  Yes Pyrtle, Lajuan Lines, MD  spironolactone (ALDACTONE) 100 MG tablet Take 0.5 tablets (50 mg total) by mouth daily. Patient taking differently: Take 100 mg by mouth daily.  03/07/17  Yes Eugenie Filler, MD    Current Facility-Administered Medications  Medication Dose Route Frequency Provider Last Rate Last  Dose  . piperacillin-tazobactam (ZOSYN) IVPB 3.375 g  3.375 g Intravenous Q8H Dara Hoyer, RPH      . potassium chloride 10 mEq in 100 mL IVPB  10 mEq Intravenous Q1 Hr x 3 Pattricia Boss, MD 100 mL/hr at 04/27/17 1134 10 mEq at 04/27/17 1134  . vancomycin (VANCOCIN) 1,500 mg in sodium chloride 0.9 % 500 mL IVPB  1,500 mg Intravenous Once Dara Hoyer, RPH 250 mL/hr at 04/27/17 1137 1,500 mg at 04/27/17 1137  . vancomycin (VANCOCIN) IVPB 750 mg/150 ml premix  750 mg Intravenous Q12H Dara Hoyer, [facility]       Current Outpatient Prescriptions  Medication Sig Dispense Refill  . bismuth subsalicylate (PEPTO BISMOL) 262 MG chewable tablet Chew 262-524 mg by mouth 3 (three) times daily as needed for indigestion or diarrhea or loose stools.     . calcium carbonate (TUMS EX) 750 MG chewable tablet Chew 1-2 tablets by mouth 3 (three) times daily as needed for heartburn.     . ciprofloxacin (CIPRO) 500 MG tablet Take 500 mg by mouth at bedtime.    . folic acid (FOLVITE) 1 MG tablet Take 1 tablet (1 mg total) by mouth daily. (Patient taking differently: Take 1 mg by mouth at bedtime. ) 30 tablet 0  . furosemide (LASIX) 40 MG tablet Take 1 tablet (40 mg total) by mouth daily. (Patient taking differently: Take 60 mg by mouth daily. ) 30 tablet 0  . lactulose (CHRONULAC) 10 GM/15ML solution Take 45 mLs (30 g total) by mouth 2 (two) times daily. 240 mL 0  . pantoprazole (PROTONIX) 40 MG tablet TAKE 1 TABLET (40 MG TOTAL) BY MOUTH 2 (TWO) TIMES DAILY. 60 tablet 0  . spironolactone (ALDACTONE) 100 MG tablet Take 0.5 tablets (50 mg total) by mouth daily. (Patient taking differently: Take 100 mg by mouth daily. ) 30 tablet 0    Allergies as of 04/27/2017 - Review Complete 04/27/2017  Allergen Reaction Noted  . Rifaximin Swelling and Other (See Comments) 11/25/2016    Family History  Problem Relation Age of Onset  . Diabetes Father   . Parkinson's disease Father   . Diabetes Paternal Aunt   .  Glaucoma Mother     Social History   Social History  . Marital status: Married    Spouse name: Lelan Pons  . Number of children: 3  . Years of education: N/A   Occupational History  . Sales    Social History Main Topics  . Smoking status: Never Smoker  . Smokeless tobacco: Never Used  . Alcohol use No     Comment: Quit 2/17, relapse in 1/18 for short period of time  . Drug use: No  . Sexual activity: Not Currently   Other Topics Concern  . Not on file   Social History Narrative   Lives with wife.  Normally able to ambulate without  assistance.    Review of Systems: ROS is O/W negative except as mentioned in HPI.  Physical Exam: Vital signs in last 24 hours: Temp:  [97.6 F (36.4 C)-98 F (36.7 C)] 98 F (36.7 C) (05/18 1226) Pulse Rate:  [91-117] 116 (05/18 1230) Resp:  [19-29] 22 (05/18 1230) BP: (111-150)/(60-90) 125/69 (05/18 1230) SpO2:  [93 %-98 %] 94 % (05/18 1230) Weight:  [190 lb (86.2 kg)] 190 lb (86.2 kg) (05/18 1028)   General:  Lethargic, able to arouse but would fall back to sleep quickly.  Jaundice noted. Head:  Normocephalic and atraumatic. Eyes:  Sclerae icteric. Ears:  Normal auditory acuity. Mouth:  No deformity or lesions.   Lungs:  Clear throughout to auscultation.  No wheezes, crackles, or rhonchi.  Heart:  Regular rate and rhythm; no murmurs, clicks, rubs,  or gallops. Abdomen:  Soft, distended with some fluid.  BS present.  Does not express tenderness on palpation. Rectal:  Deferred.  FOBT positive in the ED.  Msk:  Symmetrical without gross deformities. Pulses:  Normal pulses noted. Extremities:  1-2 + pitting edema in B/L LE's. Neurologic:  Grossly normal neurologically.  Intake/Output this shift: Total I/O In: 500 [I.V.:500] Out: -   Lab Results:  Recent Labs  04/25/17 1638 04/27/17 0850  WBC 8.8 8.6  HGB 6.5 Repeated and verified X2.* 6.9*  HCT 19.4 Repeated and verified X2.* 20.2*  PLT 91.0* 100*   BMET  Recent Labs   04/25/17 1638 04/27/17 0850  NA 132* 134*  K 3.1* 2.9*  CL 91* 92*  CO2 29 30  GLUCOSE 216* 128*  BUN 12 11  CREATININE 1.55* 1.51*  CALCIUM 7.2* 7.5*   LFT  Recent Labs  04/27/17 0850  PROT 6.1*  ALBUMIN 2.5*  AST 77*  ALT 33  ALKPHOS 132*  BILITOT 7.0*   PT/INR  Recent Labs  04/27/17 0900  LABPROT 28.2*  INR 2.58   Studies/Results: Ct Head Wo Contrast  Result Date: 04/27/2017 CLINICAL DATA:  Confusion. EXAM: CT HEAD WITHOUT CONTRAST TECHNIQUE: Contiguous axial images were obtained from the base of the skull through the vertex without intravenous contrast. COMPARISON:  None. FINDINGS: Brain: There is low attenuation within the periventricular and subcortical white matter. Prominence of the sulci and ventricles identified compatible with brain atrophy. No abnormal extra-axial fluid collection, intracranial hemorrhage or mass noted. No acute infarction. Vascular: No hyperdense vessel or unexpected calcification. Skull: Normal. Negative for fracture or focal lesion. Sinuses/Orbits: No acute finding. Other: None. IMPRESSION: 1. No acute intracranial abnormality. 2. Chronic small vessel ischemic change and brain atrophy. Electronically Signed   By: Kerby Moors M.D.   On: 04/27/2017 09:39   Dg Chest Port 1 View  Result Date: 04/27/2017 CLINICAL DATA:  Cough. EXAM: PORTABLE CHEST 1 VIEW COMPARISON:  Radiographs of March 27, 2017. FINDINGS: Stable cardiomediastinal silhouette. No pneumothorax is noted. Left lung is clear. Increased right basilar opacity is noted concerning for worsening atelectasis or infiltrate with associated pleural effusion. Bony thorax is unremarkable. IMPRESSION: Increased right basilar atelectasis or infiltrate with associated pleural effusion. Electronically Signed   By: Marijo Conception, M.D.   On: 04/27/2017 09:10   IMPRESSION:  *56 yo male with decompensated ETOH cirrhosis, MELD 29 currently . He has ascites, hx of SBP, grade I varices, coagulopathy,  HE, and thrombocytopenia.   *Symptomatic anemia: Hgb down to 6.9 grams from 9.2 grams just 3 weeks ago.  History of known varices, heme positive stools in the ER.  Some of this is likely related to liver disease and dysfunctional bone marrow but he did have a black stool this morning as well. *AKI:  Cr worsening slightly again at 1.51 today. *Acute HE with ammonia level of 87. *Hypokalemia:  Will be corrected by primary service.  Plan: -Agree with transfusion to keep Hgb 8-9 grams. -Patient to stay on his lactulose as prescribed -? Repeat EGD per Dr. Ardis Hughs. -Diuretics have been resumed at lasix 60 mg daily and spironolactone 100 mg daily for now. -May need paracentesis prior to discharge but need to monitor renal function, etc first. -Is now on Vanco and Zosyn for PNA so cipro is on hold. -Continue IV Pantoprazole 40 mg twice a day. -He is on octreotide but unsure that he needs that as he does not appear to be actively bleeding. -Dose of lactulose given PO and one has been ordered as enema also. -Monitor coags, renal function, LFT's, and blood counts.  *PATIENT WAS ACTIVELY DRINKING UP UNTIL ONE MONTH AGO with ETOH level of 215 on 4/17.   ZEHR, JESSICA D.  04/27/2017, 1:18 PM  Pager number [phone]   ________________________________________________________________________  Velora Heckler GI MD note:  I personally examined the patient, reviewed the data and agree with the assessment and plan described above.  He is nearly obtunded from HE, possibly from underlying pneumonia (IV abx started), possibly due to severe anemia (getting blood transfusion); he may have SBP (the vanc and zosyn should suffice if he does and so will hold off on diagnostic paracentesis for now).  His current MELD is 29.  I discussed the severity of his illness with his wife. She understands this situation is life threatening and wants him to remain FULL CODE for now.  Will try to reverse his coagulapathy with FFP, vit K.   Trying to reverse his HE with lactulose (has has severe edema with xifaxin) orally if he will reliably take it.  Wife agreed to NG tube placement to get him his meds if he is not awake enough.  He will also get enema lactulose.  He has black, heme + stool but his wife says he often takes pepto bismol.  I don't think it is safe for EGD currently anyway given his obtundation.    He is very sick and there is a reasonable chance that he will decompensate further despite the above measures.  Would consider transferring him to ICU type setting. I discussed with Dr. Sloan Leiter who agrees.    Owens Loffler, MD [facility] Pager [phone]

## 2017-04-27 NOTE — ED Notes (Signed)
Pt reports extreme back pain that began prior to blood administration.

## 2017-04-27 NOTE — Progress Notes (Signed)
Pharmacy Antibiotic Note  Esdras Delair is a 56 y.o. male admitted on 04/27/2017 with pneumonia.  Pharmacy has been consulted for vancomycin dosing for 8 day treatment.  Zosyn ordered by MD.  Plan: Vancomycin 1500 mg IV x 1 then 750 mg IV q12h. Adjust Zosyn to 3.375g IV q8h (4 hour infusion time) since CrCl>20 ml/min. Daily SCr.  Weight: 190 lb (86.2 kg)  Temp (24hrs), Avg:97.6 F (36.4 C), Min:97.6 F (36.4 C), Max:97.6 F (36.4 C)   Recent Labs Lab 04/25/17 1638 04/27/17 0850  WBC 8.8 8.6  CREATININE 1.55* 1.51*    Estimated Creatinine Clearance: 56.5 mL/min (A) (by C-G formula based on SCr of 1.51 mg/dL (H)).    Allergies  Allergen Reactions  . Rifaximin Swelling and Other (See Comments)    Reaction:  All over body swelling     Antimicrobials this admission: 5/18 Cefepime 5/18 Vancomycin >>  Dose adjustments this admission:  Microbiology results:  Thank you for allowing pharmacy to be a part of this patient's care.  Hershal Coria 04/27/2017 10:41 AM

## 2017-04-27 NOTE — Progress Notes (Signed)
Notified by RN-family now agreeable to transfer to SDU, and to continue DNR order Transfer orders entered

## 2017-04-27 NOTE — Progress Notes (Signed)
Patient more lethargic, MD notified.  Thomas [name]. Brigitte Pulse, RN

## 2017-04-27 NOTE — Progress Notes (Signed)
Report called to ICU RN. Patient resting comfortably after administration of IV ativan.  Thomas [name]. Brigitte Pulse, RN

## 2017-04-27 NOTE — ED Triage Notes (Signed)
Per family, pt not acting normal since 5 am.  Pt unable to answer all questions appropriately.  Pt was trying to turn off his alarm this morning with his tv control.  Pt delayed in responses.

## 2017-04-27 NOTE — H&P (Signed)
HISTORY AND PHYSICAL       PATIENT DETAILS Name: Thomas [name] Age: 56 y.o. Sex: male Date of Birth: 02/09/1961 Admit Date: 04/27/2017 ZOX:[id number], L.Marlou Sa, MD   Patient coming from: Home   CHIEF COMPLAINT:  Confusion since this morning  Note-patient confused unable to provide history-most of this history is obtained after speaking to the patient's spouse.  HPI: Thomas [name] is a 56 y.o. male with medical history significant of alcoholic liver cirrhosis, prior history of SBP, esophageal varices brought to the ED by his spouse for evaluation of confusion that was noted when the patient woke up this morning. Per patient's spouse, patient was at his usual baseline until yesterday evening. This morning, he woke up and was trying to turn the alarm off with the tv remote. He subsequently was just not making sense and as a result he was brought to the ED for further evaluation and treatment.  Per spouse, patient is apparently compliant with his medications including lactulose. Per spouse patient has had black tarry stools over the past few days. His last bowel movement was last night-patient's spouse claims that the stool was black in color.  Per spouse, patient has had a persistent cough for approximately a week, cough is mostly dry and color. She is not aware of any fever or shortness of breath. There is no history of nausea, vomiting or diarrhea.  Spouse is also noted that since the past one week, patient has had some spells/several episodes where he raises his bilateral hands and has "blinking"of his eyes which lasts for a few seconds to a minute and spontaneously resolved. She has not noticed any seizure-like activity, patient has not lost confusion during these episodes.  Spouse claims that the patient has stopped drinking, but she is very well aware that he occasionally relapses. His alcohol level today was less than 5, but approximately a month back it was 215.  ED Course:    In the emergency room patient was found to be confused, and x-ray of his chest showed right-sided pneumonia. He was found to have a hemoglobin of 6.9. He is empirically given vancomycin and Zosyn, and 1 unit of PRBC was ordered, and I was subsequently asked to admit this patient for further evaluation and treatment.  Note: Lives at: Home Mobility:  Independent Chronic Indwelling Foley:no   REVIEW OF SYSTEMS: Obtained after speaking with the patient's spouse Constitutional:   No  weight loss,  Fevers, chills, fatigue.  HEENT:    No headaches, Dysphagia,Tooth/dental problems,Sore throat  Cardio-vascular: No chest pain,Orthopnea, PND , palpitations  GI:  No heartburn, indigestion, abdominal pain, nausea, vomiting, diarrhea, melena or hematochezia  Resp: No shortness of breath,  hemoptysis,plueritic chest pain.   Skin:  No rash or lesions.  GU:  No dysuria, change in color of urine, no urgency or frequency.    Musculoskeletal: No joint pain or swelling.    Endocrine: No heat intolerance  Psych:  No memory loss.   ALLERGIES:   Allergies  Allergen Reactions  . Rifaximin Swelling and Other (See Comments)    Reaction:  All over body swelling     PAST MEDICAL HISTORY: Past Medical History:  Diagnosis Date  . Alcohol abuse 2013  . Alcoholic cirrhosis ([place]) 08/8118  . Anemia 02/2016   in setting of GI blood loss, but MCV macrocytic.   . Coagulopathy ([place]) 05/2015  . Diverticulosis 09/2015  . Esophageal varices ([place]) 09/2015   small  .  GERD (gastroesophageal reflux disease)   . Hyperlipidemia   . Hypertension   . Portal hypertension ([place]) 09/2015   with portal gastropathy  . Thrombocytopenia ([place]) 02/2016  . Tubular adenoma of colon 09/2015    PAST SURGICAL HISTORY: Past Surgical History:  Procedure Laterality Date  . ESOPHAGOGASTRODUODENOSCOPY N/A 03/05/2016   Procedure: ESOPHAGOGASTRODUODENOSCOPY (EGD);  Surgeon: Ladene Artist, MD;  Location: [name];   Service: Endoscopy;  Laterality: N/A;  . ESOPHAGOGASTRODUODENOSCOPY (EGD) WITH PROPOFOL N/A 03/29/2017   Procedure: ESOPHAGOGASTRODUODENOSCOPY (EGD) WITH PROPOFOL;  Surgeon: Doran Stabler, MD;  Location: [facility];  Service: Endoscopy;  Laterality: N/A;  . testicle prosthesis      MEDICATIONS AT HOME: Prior to Admission medications   Medication Sig Start Date End Date Taking? Authorizing Provider  bismuth subsalicylate (PEPTO BISMOL) 262 MG chewable tablet Chew 262-524 mg by mouth 3 (three) times daily as needed for indigestion or diarrhea or loose stools.    Yes [provider]  calcium carbonate (TUMS EX) 750 MG chewable tablet Chew 1-2 tablets by mouth 3 (three) times daily as needed for heartburn.    Yes [provider]  ciprofloxacin (CIPRO) 500 MG tablet Take 500 mg by mouth at bedtime.   Yes [provider]  folic acid (FOLVITE) 1 MG tablet Take 1 tablet (1 mg total) by mouth daily. Patient taking differently: Take 1 mg by mouth at bedtime.  03/30/17  Yes Mikhail, Velta Addison, DO  furosemide (LASIX) 40 MG tablet Take 1 tablet (40 mg total) by mouth daily. Patient taking differently: Take 60 mg by mouth daily.  03/07/17  Yes Eugenie Filler, MD  lactulose (CHRONULAC) 10 GM/15ML solution Take 45 mLs (30 g total) by mouth 2 (two) times daily. 03/07/16  Yes Mikhail, Maryann, DO  pantoprazole (PROTONIX) 40 MG tablet TAKE 1 TABLET (40 MG TOTAL) BY MOUTH 2 (TWO) TIMES DAILY. 03/10/16  Yes Pyrtle, Lajuan Lines, MD  spironolactone (ALDACTONE) 100 MG tablet Take 0.5 tablets (50 mg total) by mouth daily. Patient taking differently: Take 100 mg by mouth daily.  03/07/17  Yes Eugenie Filler, MD    FAMILY HISTORY: Family History  Problem Relation Age of Onset  . Diabetes Father   . Parkinson's disease Father   . Diabetes Paternal Aunt   . Glaucoma Mother     SOCIAL HISTORY:  reports that he has never smoked. He has never used smokeless tobacco. He reports that he  does not drink alcohol or use drugs.  PHYSICAL EXAM: Blood pressure 124/60, pulse 91, temperature 97.9 F (36.6 C), temperature source Oral, resp. rate 19, weight 86.2 kg (190 lb), SpO2 93 %.  General appearance :Awake, But confused-recognizes his wife but can't tell me how many children he has already is. Speech is slow but clear. He occasionally follows commands. He does not appear to be in any distress.  Eyes:, pupils equally reactive to light and accomodation,no scleral icterus. HEENT: Atraumatic and Normocephalic Neck: supple, no JVD. No cervical lymphadenopathy.  Resp:Good air entry bilaterally CVS: S1 S2 regular GI: Bowel sounds present, abdomen does appear distended with dullness in his flanks. It does not appear to be tense. There is no tenderness on my exam.  Extremities: B/L Lower Ext shows + edema, both legs are warm to touch Neurology:  speech clear,Non focal, sensation is grossly intact. Psychiatric: Normal judgment and insight. Alert and oriented x 3. Normal mood. Musculoskeletal:gait appears to be normal.No digital cyanosis Skin:No Rash, warm and dry Wounds:N/A  LABS  ON ADMISSION:  I have personally reviewed following labs and imaging studies  CBC:  Recent Labs Lab 04/25/17 1638 04/27/17 0850  WBC 8.8 8.6  HGB 6.5 Repeated and verified X2.* 6.9*  HCT 19.4 Repeated and verified X2.* 20.2*  MCV 122.2* 118.1*  PLT 91.0* 100*    Basic Metabolic Panel:  Recent Labs Lab 04/25/17 1638 04/27/17 0850  NA 132* 134*  K 3.1* 2.9*  CL 91* 92*  CO2 29 30  GLUCOSE 216* 128*  BUN 12 11  CREATININE 1.55* 1.51*  CALCIUM 7.2* 7.5*    GFR: Estimated Creatinine Clearance: 56.5 mL/min (A) (by C-G formula based on SCr of 1.51 mg/dL (H)).  Liver Function Tests:  Recent Labs Lab 04/27/17 0850  AST 77*  ALT 33  ALKPHOS 132*  BILITOT 7.0*  PROT 6.1*  ALBUMIN 2.5*   No results for input(s): LIPASE, AMYLASE in the last 168 hours.  Recent Labs Lab  04/27/17 0850  AMMONIA 87*    Coagulation Profile:  Recent Labs Lab 04/27/17 0900  INR 2.58    Cardiac Enzymes: No results for input(s): CKTOTAL, CKMB, CKMBINDEX, TROPONINI in the last 168 hours.  BNP (last 3 results) No results for input(s): PROBNP in the last 8760 hours.  HbA1C: No results for input(s): HGBA1C in the last 72 hours.  CBG:  Recent Labs Lab 04/27/17 0845  GLUCAP 126*    Lipid Profile: No results for input(s): CHOL, HDL, LDLCALC, TRIG, CHOLHDL, LDLDIRECT in the last 72 hours.  Thyroid Function Tests: No results for input(s): TSH, T4TOTAL, FREET4, T3FREE, THYROIDAB in the last 72 hours.  Anemia Panel: No results for input(s): VITAMINB12, FOLATE, FERRITIN, TIBC, IRON, RETICCTPCT in the last 72 hours.  Urine analysis:    Component Value Date/Time   COLORURINE AMBER (A) 03/03/2017 1843   APPEARANCEUR CLEAR 03/03/2017 1843   LABSPEC 1.014 03/03/2017 1843   PHURINE 5.0 03/03/2017 1843   GLUCOSEU NEGATIVE 03/03/2017 1843   HGBUR NEGATIVE 03/03/2017 1843   BILIRUBINUR NEGATIVE 03/03/2017 1843   KETONESUR NEGATIVE 03/03/2017 1843   PROTEINUR NEGATIVE 03/03/2017 1843   UROBILINOGEN 0.2 11/22/2012 0856   NITRITE NEGATIVE 03/03/2017 1843   LEUKOCYTESUR NEGATIVE 03/03/2017 1843    Sepsis Labs: Lactic Acid, Venous    Component Value Date/Time   LATICACIDVEN 2.13 ([place]) 03/05/2016 1134     Microbiology: No results found for this or any previous visit (from the past 240 hour(s)).    RADIOLOGIC STUDIES ON ADMISSION: Ct Head Wo Contrast  Result Date: 04/27/2017 CLINICAL DATA:  Confusion. EXAM: CT HEAD WITHOUT CONTRAST TECHNIQUE: Contiguous axial images were obtained from the base of the skull through the vertex without intravenous contrast. COMPARISON:  None. FINDINGS: Brain: There is low attenuation within the periventricular and subcortical white matter. Prominence of the sulci and ventricles identified compatible with brain atrophy. No abnormal  extra-axial fluid collection, intracranial hemorrhage or mass noted. No acute infarction. Vascular: No hyperdense vessel or unexpected calcification. Skull: Normal. Negative for fracture or focal lesion. Sinuses/Orbits: No acute finding. Other: None. IMPRESSION: 1. No acute intracranial abnormality. 2. Chronic small vessel ischemic change and brain atrophy. Electronically Signed   By: Kerby Moors M.D.   On: 04/27/2017 09:39   Dg Chest Port 1 View  Result Date: 04/27/2017 CLINICAL DATA:  Cough. EXAM: PORTABLE CHEST 1 VIEW COMPARISON:  Radiographs of March 27, 2017. FINDINGS: Stable cardiomediastinal silhouette. No pneumothorax is noted. Left lung is clear. Increased right basilar opacity is noted concerning for worsening atelectasis or infiltrate with  associated pleural effusion. Bony thorax is unremarkable. IMPRESSION: Increased right basilar atelectasis or infiltrate with associated pleural effusion. Electronically Signed   By: Marijo Conception, M.D.   On: 04/27/2017 09:10    I have personally reviewed images of chest xray  ASSESSMENT AND PLAN: Acute metabolic encephalopathy: Suspect this is multifactorial-but predominantly from pneumonia,  ammonia levels are only mildly elevated-he may have a component of mild hepatic encephalopathy as well. Plans are to continue with IV antibiotics, lactulose and follow clinical course. Note CT head done in the emergency room was negative for acute abnormalities. His wife does give history of several episodes of eye blinking and flexion/raising of his hand that lasts for a few seconds/minutes over the past 1 week-although doubt seizure-we will get a EEG to make sure he does not have nonconvulsive seizures contributing to his encephalopathy. If he does not improve with the above supportive measures, may need urology evaluation at some point.  Healthcare associated pneumonia: Continue vancomycin and Zosyn that was started in the emergency room, check blood cultures  and urine antigen studies.  Probable upper GI bleeding: Patient had similar presentation last month-endoscopy did not show any foci of bleeding. Although he has melena-he does not appear to be that he actively bleeding, he appears to have a slow bleeding- if at all. His wife does acknowledge black stools over the past few days-will start PPI and octreotide. GI has been consulted for further assistance.  Acute blood loss anemia on anemia secondary to chronic disease: Acute blood loss anemia secondary to above, being transfused at least 1 unit of PRBC-we will follow CBC posttransfusion and decide whether additional units are needed.   Hypokalemia: Secondary to diuretic therapy, being repleted in the emergency room-recheck electrolytes tomorrow morning.  Alcoholic liver cirrhosis with ascites and prior history of SBP: He does have ascites on exam, does have lower extremity edema-we will hold diuretics today and resume from tomorrow. He probably may require paracentesis during this hospital course. Please assess volume status closely and follow electrolytes. I presume he is on ciprofloxacin for SBP prophylaxis-this is currently on hold as he is on empiric antimicrobial therapy.  Coagulopathy/thrombocytopenia: Secondary to liver cirrhosis, follow-up periodically.  Goals of care: Spoke with spouse at bedside-she is aware of poor overall prognoses but wishes that he continued to be a full code for now. If he deteriorates while inpatient, they be worth pursuing palliative care for further determination of goals of care.  Further plan will depend as patient's clinical course evolves and further radiologic and laboratory data become available. Patient will be monitored closely.  Above noted plan was discussed with patient/spouse face to face at bedside, they were in agreement.   CONSULTS: None   DVT Prophylaxis: SCD's  Code Status: DNR  Disposition Plan:  Discharge back home  possibly in 2-3 days,  depending on clinical course  Admission status:  Inpatient v going to tele  The medical decision making on this patient was of high complexity and the patient is at high risk for clinical deterioration, therefore this is a level 3 visit.  Total time spent  55 minutes.Greater than 50% of this time was spent in counseling, explanation of diagnosis, planning of further management, and coordination of care.  Oren Binet [facility] Pager [phone]  If 7PM-7AM, please contact night-coverage [email or web address] Password [facility] 04/27/2017, 12:25 PM

## 2017-04-28 ENCOUNTER — Encounter (HOSPITAL_COMMUNITY)
Admission: EM | Disposition: A | Discharge status: Home / Self Care | Payer: Self-pay | Source: Home / Self Care | Attending: Family Medicine

## 2017-04-28 DIAGNOSIS — G9340 Encephalopathy, unspecified: Secondary | ICD-10-CM

## 2017-04-28 LAB — BASIC METABOLIC PANEL
ANION GAP: 10 (ref 5–15)
BUN: 12 mg/dL (ref 6–20)
CO2: 30 mmol/L (ref 22–32)
Calcium: 7.2 mg/dL — ABNORMAL LOW (ref 8.9–10.3)
Chloride: 96 mmol/L — ABNORMAL LOW (ref 101–111)
Creatinine, Ser: 1.61 mg/dL — ABNORMAL HIGH (ref 0.61–1.24)
GFR calc non Af Amer: 47 mL/min — ABNORMAL LOW (ref >60)
GFR, EST AFRICAN AMERICAN: 54 mL/min — AB (ref >60)
Glucose, Bld: 114 mg/dL — ABNORMAL HIGH (ref 65–99)
Potassium: 3.1 mmol/L — ABNORMAL LOW (ref 3.5–5.1)
SODIUM: 136 mmol/L (ref 135–145)

## 2017-04-28 LAB — MAGNESIUM: Magnesium: 1.2 mg/dL — ABNORMAL LOW (ref 1.7–2.4)

## 2017-04-28 LAB — CBC
HCT: 21.1 % — ABNORMAL LOW (ref 39.0–52.0)
HCT: 25.3 % — ABNORMAL LOW (ref 39.0–52.0)
HEMATOCRIT: 23.9 % — AB (ref 39.0–52.0)
HEMOGLOBIN: 7.1 g/dL — AB (ref 13.0–17.0)
Hemoglobin: 8.5 g/dL — ABNORMAL LOW (ref 13.0–17.0)
Hemoglobin: 8.2 g/dL — ABNORMAL LOW (ref 13.0–17.0)
MCH: 38.6 pg — AB (ref 26.0–34.0)
MCH: 36.2 pg — AB (ref 26.0–34.0)
MCH: 36.6 pg — ABNORMAL HIGH (ref 26.0–34.0)
MCHC: 33.6 g/dL (ref 30.0–36.0)
MCHC: 33.6 g/dL (ref 30.0–36.0)
MCHC: 34.3 g/dL (ref 30.0–36.0)
MCV: 114.7 fL — ABNORMAL HIGH (ref 78.0–100.0)
MCV: 107.7 fL — AB (ref 78.0–100.0)
MCV: 106.7 fL — ABNORMAL HIGH (ref 78.0–100.0)
PLATELETS: 78 10*3/uL — AB (ref 150–400)
PLATELETS: 73 10*3/uL — AB (ref 150–400)
Platelets: 69 10*3/uL — ABNORMAL LOW (ref 150–400)
RBC: 1.84 MIL/uL — AB (ref 4.22–5.81)
RBC: 2.35 MIL/uL — ABNORMAL LOW (ref 4.22–5.81)
RBC: 2.24 MIL/uL — ABNORMAL LOW (ref 4.22–5.81)
WBC: 8.7 10*3/uL (ref 4.0–10.5)
WBC: 8.2 10*3/uL (ref 4.0–10.5)
WBC: 7.6 10*3/uL (ref 4.0–10.5)

## 2017-04-28 LAB — GLUCOSE, CAPILLARY
GLUCOSE-CAPILLARY: 121 mg/dL — AB (ref 65–99)
Glucose-Capillary: 151 mg/dL — ABNORMAL HIGH (ref 65–99)
Glucose-Capillary: 140 mg/dL — ABNORMAL HIGH (ref 65–99)

## 2017-04-28 LAB — PREPARE RBC (CROSSMATCH)

## 2017-04-28 SURGERY — EGD (ESOPHAGOGASTRODUODENOSCOPY)
Anesthesia: Moderate Sedation

## 2017-04-28 MED ORDER — MAGNESIUM SULFATE 4 GM/100ML IV SOLN
4.0000 g | Freq: Once | INTRAVENOUS | Status: AC
  Administered 2017-04-28: 4 g via INTRAVENOUS
  Filled 2017-04-28: qty 100

## 2017-04-28 MED ORDER — FUROSEMIDE 10 MG/ML IJ SOLN
20.0000 mg | Freq: Once | INTRAMUSCULAR | Status: AC
  Administered 2017-04-28: 20 mg via INTRAVENOUS
  Filled 2017-04-28: qty 2

## 2017-04-28 MED ORDER — POTASSIUM CHLORIDE 10 MEQ/100ML IV SOLN
10.0000 meq | INTRAVENOUS | Status: AC
  Administered 2017-04-28 (×4): 10 meq via INTRAVENOUS
  Filled 2017-04-28 (×3): qty 100

## 2017-04-28 MED ORDER — SODIUM CHLORIDE 0.9 % IV SOLN
Freq: Once | INTRAVENOUS | Status: AC
  Administered 2017-04-28: 09:00:00 via INTRAVENOUS

## 2017-04-28 NOTE — Progress Notes (Signed)
PROGRESS NOTE [facility]   Roran Wegner   JGO:[id number] DOB: 04-10-1961  DOA: 04/27/2017 PCP: Alroy Dust, L.Marlou Sa, MD   Brief Narrative:  56 year old male with medical history significant of alcoholic liver cirrhosis, prior history of SBP, esophageal paresis was brought to the ED by his spouse due to confusion. In the emergency room he was found to be confused and lethargic, chest x-ray showed right-sided pneumonia and he was found to have hemoglobin 6.9 use. Currently receiving vancomycin and Zosyn and 1 unit of PRBC was ordered his was admitted to step down for further evaluation.  Subjective: Patient seen and examined, doing much better today, per family " change is day and night difference" Patient report feeling slight dizzy and weak. Also c/o chest tightness. Denies SOB and palpitations. Patient is now alert and oriented.   Assessment & Plan: Acute metabolic encephalopathy - Resolved  Multifactorial from hepatic encephalopathy, PNA and hypoxia Continue Zosyn, can d/c vanc as MRSA screen is negative  Continue Lactulose  Follow up blood cultures  Urine antigens pending   HCAP  See above  Continue supportive treatment   Upper GI bleed  Acute blood loss anemia on chronic anemia from chronic diseases  S/p 1 unit PRBC's, will transfuse another unit, will give a dose of Lasix after transfusion  Continue octreotide IV and PPI ggt  GI planning for endoscopy in AM  Clear liquids   Hypokalemia  Hypomagnesemia Replete Check electrolytes in the morning  AKI on CKD stage III  Baseline Cr. 1.15 03/2017 Likely from hypoperfusion due blood loss and third spacing albumin low Expected to improve as patient become more euvolemic  Holding oral lasix will give only one IV doses after transfusion  Monitor Cr in Am   Alcoholic liver cirrhosis with ascites  Ascites on my exam, I'm holding diuretic given increase in Cr, will get abdominal scan, he may need paracentesis. Patient  comfortable at this time. Patient was on Cipro, presumed for SBP prophylaxis.   DVT prophylaxis: SCD's  Code Status: DNR  Family Communication:  Disposition Plan: Keep in SDU for now   Consultants:   GI - [place]   Procedures:   None   Antimicrobials: Anti-infectives    Start     Dose/Rate Route Frequency Ordered Stop   04/27/17 2200  vancomycin (VANCOCIN) IVPB 750 mg/150 ml premix  Status:  Discontinued     750 mg 150 mL/hr over 60 Minutes Intravenous Every 12 hours 04/27/17 1041 04/27/17 1326   04/27/17 1800  piperacillin-tazobactam (ZOSYN) IVPB 3.375 g     3.375 g 12.5 mL/hr over 240 Minutes Intravenous Every 8 hours 04/27/17 1034     04/27/17 1045  piperacillin-tazobactam (ZOSYN) IVPB 2.25 g  Status:  Discontinued     2.25 g 100 mL/hr over 30 Minutes Intravenous Every 6 hours 04/27/17 1031 04/27/17 1033   04/27/17 1045  piperacillin-tazobactam (ZOSYN) IVPB 3.375 g     3.375 g 100 mL/hr over 30 Minutes Intravenous  Once 04/27/17 1031 04/27/17 1131   04/27/17 1045  vancomycin (VANCOCIN) 1,500 mg in sodium chloride 0.9 % 500 mL IVPB     1,500 mg 250 mL/hr over 120 Minutes Intravenous  Once 04/27/17 1040 04/27/17 1337   04/27/17 1030  ceFEPIme (MAXIPIME) 2 g in dextrose 5 % 50 mL IVPB  Status:  Discontinued     2 g 100 mL/hr over 30 Minutes Intravenous  Once 04/27/17 1022 04/27/17 1030        Objective: Vitals:   04/28/17 0400  04/28/17 0500 04/28/17 0600 04/28/17 0704  BP: (!) 109/43 (!) 97/48 (!) 120/59   Pulse:   88   Resp: 12 (!) 23 17   Temp:   98.9 F (37.2 C) 98.4 F (36.9 C)  TempSrc:   Axillary Axillary  SpO2: 100% 99% 96%   Weight:   89.8 kg (197 lb 15.6 oz)   Height:        Intake/Output Summary (Last 24 hours) at 04/28/17 0857 Last data filed at 04/28/17 0615  Gross per 24 hour  Intake          2195.84 ml  Output                0 ml  Net          2195.84 ml   Filed Weights   04/27/17 1322 04/27/17 2000 04/28/17 0600  Weight: 89 kg (196 lb  3.4 oz) 89.8 kg (197 lb 15.6 oz) 89.8 kg (197 lb 15.6 oz)    Examination:  General exam: Appears calm and comfortable  HEENT: OP moist and clear Respiratory system: Decrease breath sound the left side, Mild crackles in b/l bases  Cardiovascular system: S1S2 RRR no murmurs  Gastrointestinal system: Abd soft moderated distention, non tenderness. + fluid wave  Central nervous system: Alert and oriented. No focal neurological deficits. Extremities: 1+ LE edema b/l Skin: No rashes, lesions or ulcers Psychiatry: Judgement and insight appear normal. Mood & affect appropriate.    Data Reviewed: I have personally reviewed following labs and imaging studies  CBC:  Recent Labs Lab 04/25/17 1638 04/27/17 0850 04/27/17 1737 04/28/17 0736  WBC 8.8 8.6 12.6* 8.7  HGB 6.5 Repeated and verified X2.* 6.9* 8.9* 7.1*  HCT 19.4 Repeated and verified X2.* 20.2* 26.8* 21.1*  MCV 122.2* 118.1* 111.7* 114.7*  PLT 91.0* 100* 101* 69*   Basic Metabolic Panel:  Recent Labs Lab 04/25/17 1638 04/27/17 0850 04/28/17 0736  NA 132* 134* 136  K 3.1* 2.9* 3.1*  CL 91* 92* 96*  CO2 29 30 30   GLUCOSE 216* 128* 114*  BUN 12 11 12   CREATININE 1.55* 1.51* 1.61*  CALCIUM 7.2* 7.5* 7.2*   GFR: Estimated Creatinine Clearance: 54 mL/min (A) (by C-G formula based on SCr of 1.61 mg/dL (H)). Liver Function Tests:  Recent Labs Lab 04/27/17 0850  AST 77*  ALT 33  ALKPHOS 132*  BILITOT 7.0*  PROT 6.1*  ALBUMIN 2.5*   No results for input(s): LIPASE, AMYLASE in the last 168 hours.  Recent Labs Lab 04/27/17 0850  AMMONIA 87*   Coagulation Profile:  Recent Labs Lab 04/27/17 0900  INR 2.58   Cardiac Enzymes: No results for input(s): CKTOTAL, CKMB, CKMBINDEX, TROPONINI in the last 168 hours. BNP (last 3 results) No results for input(s): PROBNP in the last 8760 hours. HbA1C: No results for input(s): HGBA1C in the last 72 hours. CBG:  Recent Labs Lab 04/27/17 0845 04/28/17 0823  GLUCAP  126* 121*   Lipid Profile: No results for input(s): CHOL, HDL, LDLCALC, TRIG, CHOLHDL, LDLDIRECT in the last 72 hours. Thyroid Function Tests: No results for input(s): TSH, T4TOTAL, FREET4, T3FREE, THYROIDAB in the last 72 hours. Anemia Panel: No results for input(s): VITAMINB12, FOLATE, FERRITIN, TIBC, IRON, RETICCTPCT in the last 72 hours. Sepsis Labs: No results for input(s): PROCALCITON, LATICACIDVEN in the last 168 hours.  Recent Results (from the past 240 hour(s))  MRSA PCR Screening     Status: None   Collection Time: 04/27/17  7:01 PM  Result Value Ref Range Status   MRSA by PCR NEGATIVE NEGATIVE Final    Comment:        The GeneXpert MRSA Assay (FDA approved for NASAL specimens only), is one component of a comprehensive MRSA colonization surveillance program. It is not intended to diagnose MRSA infection nor to guide or monitor treatment for MRSA infections.      Radiology Studies: Ct Head Wo Contrast  Result Date: 04/27/2017 CLINICAL DATA:  Confusion. EXAM: CT HEAD WITHOUT CONTRAST TECHNIQUE: Contiguous axial images were obtained from the base of the skull through the vertex without intravenous contrast. COMPARISON:  None. FINDINGS: Brain: There is low attenuation within the periventricular and subcortical white matter. Prominence of the sulci and ventricles identified compatible with brain atrophy. No abnormal extra-axial fluid collection, intracranial hemorrhage or mass noted. No acute infarction. Vascular: No hyperdense vessel or unexpected calcification. Skull: Normal. Negative for fracture or focal lesion. Sinuses/Orbits: No acute finding. Other: None. IMPRESSION: 1. No acute intracranial abnormality. 2. Chronic small vessel ischemic change and brain atrophy. Electronically Signed   By: Kerby Moors M.D.   On: 04/27/2017 09:39   Dg Chest Port 1 View  Result Date: 04/27/2017 CLINICAL DATA:  Cough. EXAM: PORTABLE CHEST 1 VIEW COMPARISON:  Radiographs of March 27, 2017. FINDINGS: Stable cardiomediastinal silhouette. No pneumothorax is noted. Left lung is clear. Increased right basilar opacity is noted concerning for worsening atelectasis or infiltrate with associated pleural effusion. Bony thorax is unremarkable. IMPRESSION: Increased right basilar atelectasis or infiltrate with associated pleural effusion. Electronically Signed   By: Marijo Conception, M.D.   On: 04/27/2017 09:10    Scheduled Meds: . folic acid  1 mg Oral QHS  . furosemide  60 mg Oral Daily  . lactulose  30 g Oral TID  . pantoprazole (PROTONIX) IV  40 mg Intravenous Q12H  . sodium chloride flush  3 mL Intravenous Q12H  . spironolactone  100 mg Oral Daily   Continuous Infusions: . sodium chloride 20 mL/hr at 04/28/17 0600  . octreotide  (SANDOSTATIN)    IV infusion 50 mcg/hr (04/28/17 0615)  . phytonadione (VITAMIN K) IV Stopped (04/27/17 1903)  . piperacillin-tazobactam (ZOSYN)  IV 3.375 g (04/28/17 0259)     LOS: 1 day    Chipper Oman, MD Pager: Text Page via [email or web address]  [phone]  If 7PM-7AM, please contact night-coverage [email or web address] Password [facility] 04/28/2017, 8:57 AM

## 2017-04-28 NOTE — Progress Notes (Signed)
[facility] Progress Note    Since last GI note: MS is significantly improved this morning!!  NO abd pains, no vomiting. Only a bit of small, dark stool this morning.  Objective: Vital signs in last 24 hours: Temp:  [97.6 F (36.4 C)-99.6 F (37.6 C)] 98.4 F (36.9 C) (05/19 0704) Pulse Rate:  [88-126] 88 (05/19 0600) Resp:  [12-33] 17 (05/19 0600) BP: (92-155)/(41-85) 120/59 (05/19 0600) SpO2:  [93 %-100 %] 96 % (05/19 0600) Weight:  [190 lb (86.2 kg)-197 lb 15.6 oz (89.8 kg)] 197 lb 15.6 oz (89.8 kg) (05/19 0600) Last BM Date: 04/27/17 General: alert and oriented times 3 Heart: regular rate and rythm Abdomen: soft, non-tender, mild distension, BS+, + moderate ascites   Lab Results:  Recent Labs  04/27/17 0850 04/27/17 1737 04/28/17 0736  WBC 8.6 12.6* 8.7  HGB 6.9* 8.9* 7.1*  PLT 100* 101* 69*  MCV 118.1* 111.7* 114.7*    Recent Labs  04/25/17 1638 04/27/17 0850 04/28/17 0736  NA 132* 134* 136  K 3.1* 2.9* 3.1*  CL 91* 92* 96*  CO2 29 30 30   GLUCOSE 216* 128* 114*  BUN 12 11 12   CREATININE 1.55* 1.51* 1.61*  CALCIUM 7.2* 7.5* 7.2*    Recent Labs  04/27/17 0850  PROT 6.1*  ALBUMIN 2.5*  AST 77*  ALT 33  ALKPHOS 132*  BILITOT 7.0*    Recent Labs  04/27/17 0900  INR 2.58    Medications: Scheduled Meds: . folic acid  1 mg Oral QHS  . furosemide  60 mg Oral Daily  . lactulose  30 g Oral TID  . pantoprazole (PROTONIX) IV  40 mg Intravenous Q12H  . sodium chloride flush  3 mL Intravenous Q12H  . spironolactone  100 mg Oral Daily   Continuous Infusions: . sodium chloride 20 mL/hr at 04/28/17 0600  . octreotide  (SANDOSTATIN)    IV infusion 50 mcg/hr (04/28/17 0615)  . phytonadione (VITAMIN K) IV Stopped (04/27/17 1903)  . piperacillin-tazobactam (ZOSYN)  IV 3.375 g (04/28/17 0259)  . potassium chloride     PRN Meds:.albuterol, LORazepam, ondansetron **OR** ondansetron (ZOFRAN) IV    Assessment/Plan: 56 y.o. male  admitted with significant HE  Mental status MUCH improved; likely due to a combination of pneumonia treatment and blood transfusion for acute on chronic anemia.  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  Will plan on repeat EGD tomorrow AM.  Continue octreotide and IV PPI twice daily for now.  OK that he advance diet to clears this morning, NPO after MN.   Milus Banister, MD  04/28/2017, [facility] Pager [phone]

## 2017-04-28 NOTE — Consult Note (Signed)
Consultation Note Date: 04/28/2017   Patient Name: Thomas [name]  DOB: 03-01-1961  MRN: [id number]  Age / Sex: 56 y.o., male  PCP: Alroy Dust, L.Marlou Sa, MD Referring Physician: Patrecia Pour, Christean Grief, MD  Reason for Consultation: Establishing goals of care  HPI/Patient Profile: 56 y.o. male   admitted on 04/27/2017      Clinical Assessment and Goals of Care:  56 yo male with ETOH cirrhosis, history of SBP, esophageal varices, admitted to hospitalist service since 04-27-17 for hepatic encephalopathy, PNA, probable upper GIB, acute blood loss anemia. Patient was noted to have become more confused, hence, transferred to step down, code status was revised to DNR DNI and a palliative consult was requested.   The patient is awake alert sitting up in bed this morning, he does appear chronically ill, wife and son are at the bedside, I introduced myself and palliative care as follows: Palliative medicine is specialized medical care for people living with serious illness. It focuses on providing relief from the symptoms and stress of a serious illness. The goal is to improve quality of life for both the patient and the family.  Wife seemed apprehensive at my arrival, reassured that our role initially is to monitor disease trajectory, support patient and family holistically. Offered supportive care and active listening, endorsed patient and wife's wishes for the patient to have continued stabilization. See recommendations below, thank you for the consult.      NEXT OF KIN  wife son  SUMMARY OF RECOMMENDATIONS    Agree with previously established code status of DNR DNI, given the serious nature of patient's illness Goals of care: as discussed with patient and wife, continue current mode of care, awaiting EGD in am, patient and wife wish for continued stabilization and recovery to what ever extent possible, offered active listening  and supportive care, will follow hospital course peripherally, will re engage should the patient has sudden acute decompensation. Consider mucinex if the patient has ongoing cough, chest discomfort.   Code Status/Advance Care Planning:  DNR    Symptom Management:    as above   Palliative Prophylaxis:   Delirium Protocol  Additional Recommendations (Limitations, Scope, Preferences):  Full Scope Treatment  Psycho-social/Spiritual:   Desire for further Chaplaincy support:no  Additional Recommendations: Caregiving  Support/Resources  Prognosis:   Unable to determine  Discharge Planning: To Be Determined      Primary Diagnoses: Present on Admission: . Encephalopathy acute . Upper GI bleed . Thrombocytopenia ([place]) . Alcoholic cirrhosis of liver with ascites ([place]) . Acute blood loss anemia . Coagulopathy ([place]) . HCAP (healthcare-associated pneumonia)   I have reviewed the medical record, interviewed the patient and family, and examined the patient. The following aspects are pertinent.  Past Medical History:  Diagnosis Date  . Alcohol abuse 2013  . Alcoholic cirrhosis ([place]) 05/7892  . Anemia 02/2016   in setting of GI blood loss, but MCV macrocytic.   . Coagulopathy ([place]) 05/2015  . Diverticulosis 09/2015  . Esophageal varices ([place]) 09/2015   small  .  GERD (gastroesophageal reflux disease)   . Hyperlipidemia   . Hypertension   . [name] hypertension ([place]) 09/2015   with [name] gastropathy  . Thrombocytopenia ([place]) 02/2016  . Tubular adenoma of colon 09/2015   Social History   Social History  . Marital status: Married    Spouse name: Lelan Pons  . Number of children: 3  . Years of education: N/A   Occupational History  . Sales    Social History Main Topics  . Smoking status: Never Smoker  . Smokeless tobacco: Never Used  . Alcohol use No     Comment: Quit 2/17, relapse in 1/18 for short period of time  . Drug use: No  . Sexual activity: Not Currently    Other Topics Concern  . None   Social History Narrative   Lives with wife.  Normally able to ambulate without assistance.   Family History  Problem Relation Age of Onset  . Diabetes Father   . Parkinson's disease Father   . Diabetes Paternal Aunt   . Glaucoma Mother    Scheduled Meds: . folic acid  1 mg Oral QHS  . lactulose  30 g Oral TID  . pantoprazole (PROTONIX) IV  40 mg Intravenous Q12H  . sodium chloride flush  3 mL Intravenous Q12H  . spironolactone  100 mg Oral Daily   Continuous Infusions: . sodium chloride 20 mL/hr at 04/28/17 0600  . sodium chloride    . magnesium sulfate 1 - 4 g bolus IVPB 4 g (04/28/17 1050)  . octreotide  (SANDOSTATIN)    IV infusion 50 mcg/hr (04/28/17 0615)  . phytonadione (VITAMIN K) IV 10 mg (04/28/17 1003)  . piperacillin-tazobactam (ZOSYN)  IV 3.375 g (04/28/17 0959)  . potassium chloride Stopped (04/28/17 1054)   PRN Meds:.albuterol, LORazepam, ondansetron **OR** ondansetron (ZOFRAN) IV Medications Prior to Admission:  Prior to Admission medications   Medication Sig Start Date End Date Taking? Authorizing Provider  bismuth subsalicylate (PEPTO BISMOL) 262 MG chewable tablet Chew 262-524 mg by mouth 3 (three) times daily as needed for indigestion or diarrhea or loose stools.    Yes [provider]  calcium carbonate (TUMS EX) 750 MG chewable tablet Chew 1-2 tablets by mouth 3 (three) times daily as needed for heartburn.    Yes [provider]  ciprofloxacin (CIPRO) 500 MG tablet Take 500 mg by mouth at bedtime.   Yes [provider]  folic acid (FOLVITE) 1 MG tablet Take 1 tablet (1 mg total) by mouth daily. Patient taking differently: Take 1 mg by mouth at bedtime.  03/30/17  Yes Mikhail, Velta Addison, DO  furosemide (LASIX) 40 MG tablet Take 1 tablet (40 mg total) by mouth daily. Patient taking differently: Take 60 mg by mouth daily.  03/07/17  Yes Eugenie Filler, MD  lactulose (CHRONULAC) 10 GM/15ML solution  Take 45 mLs (30 g total) by mouth 2 (two) times daily. 03/07/16  Yes Mikhail, Maryann, DO  pantoprazole (PROTONIX) 40 MG tablet TAKE 1 TABLET (40 MG TOTAL) BY MOUTH 2 (TWO) TIMES DAILY. 03/10/16  Yes Pyrtle, Lajuan Lines, MD  spironolactone (ALDACTONE) 100 MG tablet Take 0.5 tablets (50 mg total) by mouth daily. Patient taking differently: Take 100 mg by mouth daily.  03/07/17  Yes Eugenie Filler, MD   Allergies  Allergen Reactions  . Rifaximin Swelling and Other (See Comments)    Reaction:  All over body swelling    Review of Systems +cough + pain in upper abdomen and chest while  coughing + chronic itching.   Physical Exam Awake alert Sitting up in bed Appears with slight scleral icterus Ruddy complexion S1 S2 Clear Abdomen distended Has edema Awake alert non focal this am  Vital Signs: BP 131/65   Pulse 88   Temp 98.4 F (36.9 C) (Axillary)   Resp (!) 27   Ht 5\' 5"  (1.651 m)   Wt 89.8 kg (197 lb 15.6 oz)   SpO2 100%   BMI 32.94 kg/m  Pain Assessment: No/denies pain POSS *See Group Information*: 2-Acceptable,Slightly drowsy, easily aroused     SpO2: SpO2: 100 % O2 Device:SpO2: 100 % O2 Flow Rate: .O2 Flow Rate (L/min): 2 L/min  IO: Intake/output summary:  Intake/Output Summary (Last 24 hours) at 04/28/17 1116 Last data filed at 04/28/17 0615  Gross per 24 hour  Intake          2195.84 ml  Output                0 ml  Net          2195.84 ml    LBM: Last BM Date: 04/27/17 Baseline Weight: Weight: 86.2 kg (190 lb) Most recent weight: Weight: 89.8 kg (197 lb 15.6 oz)     Palliative Assessment/Data:   Flowsheet Rows     Most Recent Value  Intake Tab  Referral Department  Hospitalist  Unit at Time of Referral  Intermediate Care Unit  Palliative Care Primary Diagnosis  Other (Comment) [ETOH cirrhosis, PNA GIB hep enceph ]  Palliative Care Type  New Palliative care  Reason for referral  Clarify Goals of Care  Date first seen by Palliative Care  04/28/17    Clinical Assessment  Palliative Performance Scale Score  40%  Pain Max last 24 hours  4  Pain Min Last 24 hours  3  Dyspnea Max Last 24 Hours  4  Dyspnea Min Last 24 hours  3  Psychosocial & Spiritual Assessment  Palliative Care Outcomes  Patient/Family meeting held?  Yes  Who was at the meeting?  patient wife son   Palliative Care Outcomes  Clarified goals of care      Time In:  10 Time Out:  11 Time Total:  60 Greater than 50%  of this time was spent counseling and coordinating care related to the above assessment and plan.  Signed by: Loistine Chance, MD  [phone]  Please contact Palliative Medicine Team phone at [phone] for questions and concerns.  For individual provider: See Shea Evans

## 2017-04-29 ENCOUNTER — Encounter (HOSPITAL_COMMUNITY)
Admission: EM | Disposition: A | Discharge status: Home / Self Care | Payer: Self-pay | Source: Home / Self Care | Attending: Family Medicine

## 2017-04-29 ENCOUNTER — Encounter (HOSPITAL_COMMUNITY): Payer: Self-pay | Admitting: *Deleted

## 2017-04-29 DIAGNOSIS — D62 Acute posthemorrhagic anemia: Secondary | ICD-10-CM

## 2017-04-29 DIAGNOSIS — K7031 Alcoholic cirrhosis of liver with ascites: Secondary | ICD-10-CM

## 2017-04-29 DIAGNOSIS — K922 Gastrointestinal hemorrhage, unspecified: Secondary | ICD-10-CM

## 2017-04-29 DIAGNOSIS — K766 Portal hypertension: Secondary | ICD-10-CM

## 2017-04-29 DIAGNOSIS — K3189 Other diseases of stomach and duodenum: Secondary | ICD-10-CM

## 2017-04-29 DIAGNOSIS — J189 Pneumonia, unspecified organism: Secondary | ICD-10-CM

## 2017-04-29 HISTORY — PX: ESOPHAGOGASTRODUODENOSCOPY: SHX5428

## 2017-04-29 LAB — PHOSPHORUS: Phosphorus: 3 mg/dL (ref 2.5–4.6)

## 2017-04-29 LAB — HEPATIC FUNCTION PANEL
ALT: 27 U/L (ref 17–63)
AST: 65 U/L — ABNORMAL HIGH (ref 15–41)
Albumin: 2 g/dL — ABNORMAL LOW (ref 3.5–5.0)
Alkaline Phosphatase: 88 U/L (ref 38–126)
BILIRUBIN DIRECT: 2.3 mg/dL — AB (ref 0.1–0.5)
BILIRUBIN INDIRECT: 7.1 mg/dL — AB (ref 0.3–0.9)
BILIRUBIN TOTAL: 9.4 mg/dL — AB (ref 0.3–1.2)
Total Protein: 5.2 g/dL — ABNORMAL LOW (ref 6.5–8.1)

## 2017-04-29 LAB — PROTIME-INR
INR: 2.24
PROTHROMBIN TIME: 25.1 s — AB (ref 11.4–15.2)

## 2017-04-29 LAB — BASIC METABOLIC PANEL
Anion gap: 11 (ref 5–15)
BUN: 11 mg/dL (ref 6–20)
CALCIUM: 7.6 mg/dL — AB (ref 8.9–10.3)
CO2: 29 mmol/L (ref 22–32)
CREATININE: 1.22 mg/dL (ref 0.61–1.24)
Chloride: 96 mmol/L — ABNORMAL LOW (ref 101–111)
GLUCOSE: 116 mg/dL — AB (ref 65–99)
Potassium: 3 mmol/L — ABNORMAL LOW (ref 3.5–5.1)
Sodium: 136 mmol/L (ref 135–145)

## 2017-04-29 LAB — CBC
HEMATOCRIT: 24.9 % — AB (ref 39.0–52.0)
Hemoglobin: 8.3 g/dL — ABNORMAL LOW (ref 13.0–17.0)
MCH: 36.2 pg — ABNORMAL HIGH (ref 26.0–34.0)
MCHC: 33.7 g/dL (ref 30.0–36.0)
MCV: 107.3 fL — AB (ref 78.0–100.0)
Platelets: 74 10*3/uL — ABNORMAL LOW (ref 150–400)
RBC: 2.32 MIL/uL — ABNORMAL LOW (ref 4.22–5.81)
WBC: 6.2 10*3/uL (ref 4.0–10.5)

## 2017-04-29 LAB — MAGNESIUM: Magnesium: 1.7 mg/dL (ref 1.7–2.4)

## 2017-04-29 SURGERY — EGD (ESOPHAGOGASTRODUODENOSCOPY)
Anesthesia: Moderate Sedation

## 2017-04-29 MED ORDER — MIDAZOLAM HCL 10 MG/2ML IJ SOLN
INTRAMUSCULAR | Status: DC | PRN
  Administered 2017-04-29 (×2): 2 mg via INTRAVENOUS

## 2017-04-29 MED ORDER — FERROUS SULFATE 325 (65 FE) MG PO TABS
325.0000 mg | ORAL_TABLET | Freq: Every day | ORAL | Status: DC
  Administered 2017-04-29: 325 mg via ORAL
  Filled 2017-04-29: qty 1

## 2017-04-29 MED ORDER — FENTANYL CITRATE (PF) 100 MCG/2ML IJ SOLN
INTRAMUSCULAR | Status: DC | PRN
  Administered 2017-04-29 (×2): 25 ug via INTRAVENOUS

## 2017-04-29 MED ORDER — BUTAMBEN-TETRACAINE-BENZOCAINE 2-2-14 % EX AERO
INHALATION_SPRAY | CUTANEOUS | Status: DC | PRN
  Administered 2017-04-29: 2 via TOPICAL

## 2017-04-29 MED ORDER — SODIUM CHLORIDE 0.9 % IV SOLN
INTRAVENOUS | Status: DC
  Administered 2017-04-29: 21:00:00 via INTRAVENOUS

## 2017-04-29 MED ORDER — MIDAZOLAM HCL 5 MG/ML IJ SOLN
INTRAMUSCULAR | Status: AC
  Filled 2017-04-29: qty 3

## 2017-04-29 MED ORDER — FENTANYL CITRATE (PF) 100 MCG/2ML IJ SOLN
INTRAMUSCULAR | Status: AC
  Filled 2017-04-29: qty 4

## 2017-04-29 MED ORDER — MAGNESIUM SULFATE IN D5W 1-5 GM/100ML-% IV SOLN
1.0000 g | Freq: Once | INTRAVENOUS | Status: AC
  Administered 2017-04-29: 1 g via INTRAVENOUS
  Filled 2017-04-29: qty 100

## 2017-04-29 MED ORDER — POTASSIUM CHLORIDE 10 MEQ/100ML IV SOLN
10.0000 meq | INTRAVENOUS | Status: AC
  Administered 2017-04-29 (×3): 10 meq via INTRAVENOUS
  Filled 2017-04-29 (×3): qty 100

## 2017-04-29 MED ORDER — DIPHENHYDRAMINE HCL 50 MG/ML IJ SOLN
INTRAMUSCULAR | Status: AC
  Filled 2017-04-29: qty 1

## 2017-04-29 SURGICAL SUPPLY — 15 items

## 2017-04-29 NOTE — Interval H&P Note (Signed)
History and Physical Interval Note:  04/29/2017 9:45 AM  Thomas [name]  has presented today for surgery, with the diagnosis of melena, anemia, cirrhosis  The various methods of treatment have been discussed with the patient and family. After consideration of risks, benefits and other options for treatment, the patient has consented to  Procedure(s): ESOPHAGOGASTRODUODENOSCOPY (EGD) WITH PROPOFOL (N/A) as a surgical intervention .  The patient's history has been reviewed, patient examined, no change in status, stable for surgery.  I have reviewed the patient's chart and labs.  Questions were answered to the patient's satisfaction.     Milus Banister

## 2017-04-29 NOTE — Op Note (Signed)
[facility] Patient Name: Thomas [name] Procedure Date: 04/29/2017 MRN: [id number] Attending MD: Milus Banister , MD Date of Birth: 01/26/1961 CSN: [id number] Age: 56 Admit Type: Inpatient Procedure:                Upper GI endoscopy Indications:              Iron deficiency anemia secondary to chronic blood                            loss, Melena Providers:                Milus Banister, MD, Vista Lawman, RN, Cletis Athens, Technician, Tinnie Gens, Technician Referring MD:              Medicines:                Fentanyl 50 micrograms IV, Midazolam 4 mg IV Complications:            No immediate complications. Estimated blood loss:                            None. Estimated Blood Loss:     Estimated blood loss: none. Procedure:                Pre-Anesthesia Assessment:                           - Prior to the procedure, a History and Physical                            was performed, and patient medications and                            allergies were reviewed. The patient's tolerance of                            previous anesthesia was also reviewed. The risks                            and benefits of the procedure and the sedation                            options and risks were discussed with the patient.                            All questions were answered, and informed consent                            was obtained. Prior Anticoagulants: The patient has                            taken no previous anticoagulant or antiplatelet  agents. ASA Grade Assessment: IV - A patient with                            severe systemic disease that is a constant threat                            to life. After reviewing the risks and benefits,                            the patient was deemed in satisfactory condition to                            undergo the procedure.                           After obtaining informed  consent, the endoscope was                            passed under direct vision. Throughout the                            procedure, the patient's blood pressure, pulse, and                            oxygen saturations were monitored continuously. The                            [id number] ([id number]) scope was introduced through the                            mouth, and advanced to the second part of duodenum.                            The upper GI endoscopy was accomplished without                            difficulty. The patient tolerated the procedure                            well. Scope In: Scope Out: Findings:      Trace to small distal esophagus varices.      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 No gastric varices.      The examined duodenum was normal. Impression:               - He has trace to small esophageal varices. I do                            not think these have been bleeding.                           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 He will probably benefit from daily                            iron supplement, I will start this today. Moderate Sedation:      Moderate (conscious) sedation was administered by the endoscopy nurse       and supervised by the endoscopist. The following parameters were       monitored: oxygen saturation, heart rate, blood pressure, and response       to care. Total physician intraservice time was 15 minutes. Recommendation:           - Return patient to hospital ward for ongoing care.                           - Advance diet as tolerated.                           - Continue present medications. Start once daily                            oral iron supplment today. Advance diet. Procedure Code(s):        --- Professional ---                           [phone],  Esophagogastroduodenoscopy, flexible,                            transoral; diagnostic, including collection of                            specimen(s) by brushing or washing, when performed                            (separate procedure)                           99152, Moderate sedation services provided by the                            same physician or other qualified health care                            professional performing the diagnostic or                            therapeutic service that the sedation supports,                            requiring the presence of an independent trained                            observer to assist in the monitoring of the  patient's level of consciousness and physiological                            status; initial 15 minutes of intraservice time,                            patient age 52 years or older Diagnosis Code(s):        --- Professional ---                           I85.00, Esophageal varices without bleeding                           K76.6, Portal hypertension                           K31.89, Other diseases of stomach and duodenum                           D50.0, Iron deficiency anemia secondary to blood                            loss (chronic)                           K92.1, Melena (includes Hematochezia) CPT copyright 2016 [employer]. All rights reserved. The codes documented in this report are preliminary and upon coder review may  be revised to meet current compliance requirements. Milus Banister, MD 04/29/2017 10:13:21 AM This report has been signed electronically. Number of Addenda: 0

## 2017-04-29 NOTE — Progress Notes (Signed)
PROGRESS NOTE [facility]   Thomas [name]   XBJ:[id number] DOB: May 09, 1961  DOA: 04/27/2017 PCP: Alroy Dust, L.Marlou Sa, MD   Brief Narrative:  56 year old male with medical history significant of alcoholic liver cirrhosis, prior history of SBP, esophageal paresis was brought to the ED by his spouse due to confusion. In the emergency room he was found to be confused and lethargic, chest x-ray showed right-sided pneumonia and he was found to have hemoglobin 6.9 use. Currently receiving vancomycin and Zosyn and 1 unit of PRBC was ordered his was admitted to step down for further evaluation.  Subjective: Patient continues to do well, no new events. For EGD today.   Assessment & Plan: Acute metabolic encephalopathy - Resolved  Multifactorial from hepatic encephalopathy, PNA and hypoxia Continue Zosyn, can d/c vanc as MRSA screen is negative  Continue Lactulose  Follow up blood cultures thus far negative  Urine antigens has not been collected  - pending   HCAP  See above  Continue supportive treatment   Upper GI bleed  Acute blood loss anemia on chronic anemia from chronic diseases  S/p 1 unit PRBC's, will transfuse another unit, will give a dose of Lasix after transfusion  Continue octreotide IV and PPI ggt  For EGD today - did not showed any active bleeding  Will give 1 dose of IV iron  Advance diet as tolerated   Hypokalemia  Hypomagnesemia - resolved  Replete K  Check in AM   AKI on CKD stage III  Baseline Cr. 1.15 03/2017 - improved with IVF and blood transfusion  Likely from hypoperfusion due blood loss and third spacing albumin low Continue to hold diuretics  Check BMP   Alcoholic liver cirrhosis with ascites  Ascites on my exam, I'm holding diuretic given increase in Cr. Patient comfortable at this time. Patient was on Cipro, presumed for SBP prophylaxis. Will order US paracentesis.   DVT prophylaxis: SCD's  Code Status: DNR  Family Communication:  Disposition  Plan: Can transfer to telemetry   Consultants:   GI - [facility] [facility]   Procedures:   None   Antimicrobials: Anti-infectives    Start     Dose/Rate Route Frequency Ordered Stop   04/27/17 2200  vancomycin (VANCOCIN) IVPB 750 mg/150 ml premix  Status:  Discontinued     750 mg 150 mL/hr over 60 Minutes Intravenous Every 12 hours 04/27/17 1041 04/27/17 1326   04/27/17 1800  piperacillin-tazobactam (ZOSYN) IVPB 3.375 g     3.375 g 12.5 mL/hr over 240 Minutes Intravenous Every 8 hours 04/27/17 1034     04/27/17 1045  piperacillin-tazobactam (ZOSYN) IVPB 2.25 g  Status:  Discontinued     2.25 g 100 mL/hr over 30 Minutes Intravenous Every 6 hours 04/27/17 1031 04/27/17 1033   04/27/17 1045  piperacillin-tazobactam (ZOSYN) IVPB 3.375 g     3.375 g 100 mL/hr over 30 Minutes Intravenous  Once 04/27/17 1031 04/27/17 1131   04/27/17 1045  vancomycin (VANCOCIN) 1,500 mg in sodium chloride 0.9 % 500 mL IVPB     1,500 mg 250 mL/hr over 120 Minutes Intravenous  Once 04/27/17 1040 04/27/17 1337   04/27/17 1030  ceFEPIme (MAXIPIME) 2 g in dextrose 5 % 50 mL IVPB  Status:  Discontinued     2 g 100 mL/hr over 30 Minutes Intravenous  Once 04/27/17 1022 04/27/17 1030       Objective: Vitals:   04/29/17 0503 04/29/17 0600 04/29/17 0700 04/29/17 0800  BP:  110/64 118/60 112/68  Pulse:  Resp:  (!) 25 (!) 21 (!) 26  Temp: 98.4 F (36.9 C)     TempSrc: Oral     SpO2:  99% 96% 100%  Weight: 94.8 kg (208 lb 15.9 oz)     Height:        Intake/Output Summary (Last 24 hours) at 04/29/17 0845 Last data filed at 04/29/17 0700  Gross per 24 hour  Intake          2111.75 ml  Output              300 ml  Net          1811.75 ml   Filed Weights   04/27/17 2000 04/28/17 0600 04/29/17 0503  Weight: 89.8 kg (197 lb 15.6 oz) 89.8 kg (197 lb 15.6 oz) 94.8 kg (208 lb 15.9 oz)    Examination:  General exam: NAD, mild jaundice  Respiratory system: Breath sounds decrease b/l but improving    Cardiovascular system: S1S2 no murmurs  Gastrointestinal system: Abd mild tense, distended non tenderness. + fluid wave  Central nervous system: AAOx3 non focal  Extremities: Trace LE edema  Skin: No rashes or lesion  Psychiatry: Mood appropriate   Data Reviewed: I have personally reviewed following labs and imaging studies  CBC:  Recent Labs Lab 04/27/17 1737 04/28/17 0736 04/28/17 1547 04/28/17 2105 04/29/17 0618  WBC 12.6* 8.7 8.2 7.6 6.2  HGB 8.9* 7.1* 8.5* 8.2* 8.3*  HCT 26.8* 21.1* 25.3* 23.9* 24.9*  MCV 111.7* 114.7* 107.7* 106.7* 107.3*  PLT 101* 69* 78* 73* 74*   Basic Metabolic Panel:  Recent Labs Lab 04/25/17 1638 04/27/17 0850 04/28/17 0736 04/29/17 0618  NA 132* 134* 136 136  K 3.1* 2.9* 3.1* 3.0*  CL 91* 92* 96* 96*  CO2 29 30 30 29   GLUCOSE 216* 128* 114* 116*  BUN 12 11 12 11   CREATININE 1.55* 1.51* 1.61* 1.22  CALCIUM 7.2* 7.5* 7.2* 7.6*  MG  --   --  1.2* 1.7  PHOS  --   --   --  3.0   GFR: Estimated Creatinine Clearance: 73.2 mL/min (by C-G formula based on SCr of 1.22 mg/dL). Liver Function Tests:  Recent Labs Lab 04/27/17 0850  AST 77*  ALT 33  ALKPHOS 132*  BILITOT 7.0*  PROT 6.1*  ALBUMIN 2.5*   No results for input(s): LIPASE, AMYLASE in the last 168 hours.  Recent Labs Lab 04/27/17 0850  AMMONIA 87*   Coagulation Profile:  Recent Labs Lab 04/27/17 0900 04/29/17 0618  INR 2.58 2.24   Cardiac Enzymes: No results for input(s): CKTOTAL, CKMB, CKMBINDEX, TROPONINI in the last 168 hours. BNP (last 3 results) No results for input(s): PROBNP in the last 8760 hours. HbA1C: No results for input(s): HGBA1C in the last 72 hours. CBG:  Recent Labs Lab 04/27/17 0845 04/28/17 0823 04/28/17 1122 04/28/17 1500  GLUCAP 126* 121* 151* 140*   Lipid Profile: No results for input(s): CHOL, HDL, LDLCALC, TRIG, CHOLHDL, LDLDIRECT in the last 72 hours. Thyroid Function Tests: No results for input(s): TSH, T4TOTAL, FREET4,  T3FREE, THYROIDAB in the last 72 hours. Anemia Panel: No results for input(s): VITAMINB12, FOLATE, FERRITIN, TIBC, IRON, RETICCTPCT in the last 72 hours. Sepsis Labs: No results for input(s): PROCALCITON, LATICACIDVEN in the last 168 hours.  Recent Results (from the past 240 hour(s))  Culture, blood (routine x 2)     Status: None (Preliminary result)   Collection Time: 04/27/17  9:59 AM  Result Value Ref Range  Status   Specimen Description BLOOD RIGHT WRIST  Final   Special Requests   Final    BOTTLES DRAWN AEROBIC AND ANAEROBIC Blood Culture adequate volume   Culture   Final    NO GROWTH < 24 HOURS Performed at [facility], [place] [address], [place], [place] [address]    Report Status PENDING  Incomplete  Culture, blood (routine x 2)     Status: None (Preliminary result)   Collection Time: 04/27/17  3:39 PM  Result Value Ref Range Status   Specimen Description BLOOD LEFT ANTECUBITAL  Final   Special Requests   Final    BOTTLES DRAWN AEROBIC AND ANAEROBIC Blood Culture adequate volume   Culture   Final    NO GROWTH < 24 HOURS Performed at [facility], [place] [address], [place], [place] [address]    Report Status PENDING  Incomplete  MRSA PCR Screening     Status: None   Collection Time: 04/27/17  7:01 PM  Result Value Ref Range Status   MRSA by PCR NEGATIVE NEGATIVE Final    Comment:        The GeneXpert MRSA Assay (FDA approved for NASAL specimens only), is one component of a comprehensive MRSA colonization surveillance program. It is not intended to diagnose MRSA infection nor to guide or monitor treatment for MRSA infections.      Radiology Studies: Ct Head Wo Contrast  Result Date: 04/27/2017 CLINICAL DATA:  Confusion. EXAM: CT HEAD WITHOUT CONTRAST TECHNIQUE: Contiguous axial images were obtained from the base of the skull through the vertex without intravenous contrast. COMPARISON:  None. FINDINGS: Brain: There is low attenuation within the  periventricular and subcortical white matter. Prominence of the sulci and ventricles identified compatible with brain atrophy. No abnormal extra-axial fluid collection, intracranial hemorrhage or mass noted. No acute infarction. Vascular: No hyperdense vessel or unexpected calcification. Skull: Normal. Negative for fracture or focal lesion. Sinuses/Orbits: No acute finding. Other: None. IMPRESSION: 1. No acute intracranial abnormality. 2. Chronic small vessel ischemic change and brain atrophy. Electronically Signed   By: Kerby Moors M.D.   On: 04/27/2017 09:39   Dg Chest Port 1 View  Result Date: 04/27/2017 CLINICAL DATA:  Cough. EXAM: PORTABLE CHEST 1 VIEW COMPARISON:  Radiographs of March 27, 2017. FINDINGS: Stable cardiomediastinal silhouette. No pneumothorax is noted. Left lung is clear. Increased right basilar opacity is noted concerning for worsening atelectasis or infiltrate with associated pleural effusion. Bony thorax is unremarkable. IMPRESSION: Increased right basilar atelectasis or infiltrate with associated pleural effusion. Electronically Signed   By: Marijo Conception, M.D.   On: 04/27/2017 09:10    Scheduled Meds: . folic acid  1 mg Oral QHS  . lactulose  30 g Oral TID  . pantoprazole (PROTONIX) IV  40 mg Intravenous Q12H  . sodium chloride flush  3 mL Intravenous Q12H  . spironolactone  100 mg Oral Daily   Continuous Infusions: . sodium chloride 20 mL/hr at 04/29/17 0700  . magnesium sulfate 1 - 4 g bolus IVPB    . octreotide  (SANDOSTATIN)    IV infusion 50 mcg/hr (04/29/17 0700)  . phytonadione (VITAMIN K) IV Stopped (04/28/17 1103)  . piperacillin-tazobactam (ZOSYN)  IV Stopped (04/29/17 0530)  . potassium chloride       LOS: 2 days    Chipper Oman, MD Pager: Text Page via [email or web address]  [phone]  If 7PM-7AM, please contact night-coverage [email or web address] Password [email or web address] 04/29/2017, 8:45 AM

## 2017-04-29 NOTE — H&P (View-Only) (Signed)
[facility] Progress Note    Since last GI note: MS is significantly improved this morning!!  NO abd pains, no vomiting. Only a bit of small, dark stool this morning.  Objective: Vital signs in last 24 hours: Temp:  [97.6 F (36.4 C)-99.6 F (37.6 C)] 98.4 F (36.9 C) (05/19 0704) Pulse Rate:  [88-126] 88 (05/19 0600) Resp:  [12-33] 17 (05/19 0600) BP: (92-155)/(41-85) 120/59 (05/19 0600) SpO2:  [93 %-100 %] 96 % (05/19 0600) Weight:  [190 lb (86.2 kg)-197 lb 15.6 oz (89.8 kg)] 197 lb 15.6 oz (89.8 kg) (05/19 0600) Last BM Date: 04/27/17 General: alert and oriented times 3 Heart: regular rate and rythm Abdomen: soft, non-tender, mild distension, BS+, + moderate ascites   Lab Results:  Recent Labs  04/27/17 0850 04/27/17 1737 04/28/17 0736  WBC 8.6 12.6* 8.7  HGB 6.9* 8.9* 7.1*  PLT 100* 101* 69*  MCV 118.1* 111.7* 114.7*    Recent Labs  04/25/17 1638 04/27/17 0850 04/28/17 0736  NA 132* 134* 136  K 3.1* 2.9* 3.1*  CL 91* 92* 96*  CO2 29 30 30   GLUCOSE 216* 128* 114*  BUN 12 11 12   CREATININE 1.55* 1.51* 1.61*  CALCIUM 7.2* 7.5* 7.2*    Recent Labs  04/27/17 0850  PROT 6.1*  ALBUMIN 2.5*  AST 77*  ALT 33  ALKPHOS 132*  BILITOT 7.0*    Recent Labs  04/27/17 0900  INR 2.58    Medications: Scheduled Meds: . folic acid  1 mg Oral QHS  . furosemide  60 mg Oral Daily  . lactulose  30 g Oral TID  . pantoprazole (PROTONIX) IV  40 mg Intravenous Q12H  . sodium chloride flush  3 mL Intravenous Q12H  . spironolactone  100 mg Oral Daily   Continuous Infusions: . sodium chloride 20 mL/hr at 04/28/17 0600  . octreotide  (SANDOSTATIN)    IV infusion 50 mcg/hr (04/28/17 0615)  . phytonadione (VITAMIN K) IV Stopped (04/27/17 1903)  . piperacillin-tazobactam (ZOSYN)  IV 3.375 g (04/28/17 0259)  . potassium chloride     PRN Meds:.albuterol, LORazepam, ondansetron **OR** ondansetron (ZOFRAN) IV    Assessment/Plan: 56 y.o. male  admitted with significant HE  Mental status MUCH improved; likely due to a combination of pneumonia treatment and blood transfusion for acute on chronic anemia.  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  Will plan on repeat EGD tomorrow AM.  Continue octreotide and IV PPI twice daily for now.  OK that he advance diet to clears this morning, NPO after MN.   Milus Banister, MD  04/28/2017, [facility] Pager [phone]

## 2017-04-30 ENCOUNTER — Inpatient Hospital Stay (HOSPITAL_COMMUNITY): Payer: BLUE CROSS/BLUE SHIELD

## 2017-04-30 ENCOUNTER — Inpatient Hospital Stay (HOSPITAL_COMMUNITY)
Admit: 2017-04-30 | Discharge: 2017-04-30 | Disposition: A | Discharge status: Home / Self Care | Payer: BLUE CROSS/BLUE SHIELD | Attending: Internal Medicine | Admitting: Internal Medicine

## 2017-04-30 DIAGNOSIS — G9340 Encephalopathy, unspecified: Secondary | ICD-10-CM

## 2017-04-30 LAB — BPAM RBC
BLOOD PRODUCT EXPIRATION DATE: 201806052359
Blood Product Expiration Date: 201806022359
ISSUE DATE / TIME: 201805181157
ISSUE DATE / TIME: 201805191108
Unit Type and Rh: 6200
Unit Type and Rh: 6200

## 2017-04-30 LAB — URINALYSIS, ROUTINE W REFLEX MICROSCOPIC
BILIRUBIN URINE: NEGATIVE
GLUCOSE, UA: NEGATIVE mg/dL
HGB URINE DIPSTICK: NEGATIVE
KETONES UR: NEGATIVE mg/dL
Leukocytes, UA: NEGATIVE
Nitrite: NEGATIVE
PH: 6 (ref 5.0–8.0)
PROTEIN: NEGATIVE mg/dL
Specific Gravity, Urine: 1.023 (ref 1.005–1.030)

## 2017-04-30 LAB — TYPE AND SCREEN
ABO/RH(D): A POS
Antibody Screen: NEGATIVE
UNIT DIVISION: 0
Unit division: 0

## 2017-04-30 LAB — CBC WITH DIFFERENTIAL/PLATELET
Basophils Absolute: 0.1 10*3/uL (ref 0.0–0.1)
Basophils Relative: 1 %
EOS PCT: 5 %
Eosinophils Absolute: 0.3 10*3/uL (ref 0.0–0.7)
HEMATOCRIT: 24.5 % — AB (ref 39.0–52.0)
HEMOGLOBIN: 8.2 g/dL — AB (ref 13.0–17.0)
LYMPHS ABS: 0.9 10*3/uL (ref 0.7–4.0)
Lymphocytes Relative: 15 %
MCH: 36.3 pg — ABNORMAL HIGH (ref 26.0–34.0)
MCHC: 33.5 g/dL (ref 30.0–36.0)
MCV: 108.4 fL — ABNORMAL HIGH (ref 78.0–100.0)
MONO ABS: 1.5 10*3/uL — AB (ref 0.1–1.0)
Monocytes Relative: 27 %
NEUTROS ABS: 2.9 10*3/uL (ref 1.7–7.7)
Neutrophils Relative %: 52 %
Platelets: 73 10*3/uL — ABNORMAL LOW (ref 150–400)
RBC: 2.26 MIL/uL — AB (ref 4.22–5.81)
WBC: 5.7 10*3/uL (ref 4.0–10.5)

## 2017-04-30 LAB — RAPID URINE DRUG SCREEN, HOSP PERFORMED
AMPHETAMINES: NOT DETECTED
BARBITURATES: NOT DETECTED
BENZODIAZEPINES: POSITIVE — AB
COCAINE: NOT DETECTED
OPIATES: NOT DETECTED
TETRAHYDROCANNABINOL: NOT DETECTED

## 2017-04-30 LAB — PREPARE FRESH FROZEN PLASMA
Unit division: 0
Unit division: 0
Unit division: 0

## 2017-04-30 LAB — BPAM FFP
Blood Product Expiration Date: 201805232359
Blood Product Expiration Date: 201805232359
Blood Product Expiration Date: 201805242359
ISSUE DATE / TIME: 201805182352
ISSUE DATE / TIME: 201805190316
ISSUE DATE / TIME: 201805182102
UNIT TYPE AND RH: 6200
UNIT TYPE AND RH: 6200
Unit Type and Rh: 6200

## 2017-04-30 LAB — COMPREHENSIVE METABOLIC PANEL
ALBUMIN: 1.9 g/dL — AB (ref 3.5–5.0)
ALT: 26 U/L (ref 17–63)
AST: 63 U/L — ABNORMAL HIGH (ref 15–41)
Alkaline Phosphatase: 101 U/L (ref 38–126)
Anion gap: 8 (ref 5–15)
BUN: 9 mg/dL (ref 6–20)
CALCIUM: 7.5 mg/dL — AB (ref 8.9–10.3)
CHLORIDE: 100 mmol/L — AB (ref 101–111)
CO2: 27 mmol/L (ref 22–32)
CREATININE: 0.93 mg/dL (ref 0.61–1.24)
GFR calc non Af Amer: >60 mL/min (ref >60)
GLUCOSE: 149 mg/dL — AB (ref 65–99)
Potassium: 3.3 mmol/L — ABNORMAL LOW (ref 3.5–5.1)
Sodium: 135 mmol/L (ref 135–145)
Total Bilirubin: 7 mg/dL — ABNORMAL HIGH (ref 0.3–1.2)
Total Protein: 5 g/dL — ABNORMAL LOW (ref 6.5–8.1)

## 2017-04-30 LAB — GLUCOSE, CAPILLARY: GLUCOSE-CAPILLARY: 130 mg/dL — AB (ref 65–99)

## 2017-04-30 LAB — PROTIME-INR
INR: 2.51
PROTHROMBIN TIME: 27.6 s — AB (ref 11.4–15.2)

## 2017-04-30 LAB — STREP PNEUMONIAE URINARY ANTIGEN: Strep Pneumo Urinary Antigen: NEGATIVE

## 2017-04-30 MED ORDER — SPIRONOLACTONE 100 MG PO TABS: 100.0000 mg | ORAL_TABLET | Freq: Every day | ORAL | Status: DC

## 2017-04-30 MED ORDER — FUROSEMIDE 40 MG PO TABS: 60.0000 mg | ORAL_TABLET | Freq: Every day | ORAL | Status: DC

## 2017-04-30 MED ORDER — POTASSIUM CHLORIDE CRYS ER 20 MEQ PO TBCR
40.0000 meq | EXTENDED_RELEASE_TABLET | ORAL | Status: AC
  Administered 2017-04-30 (×2): 40 meq via ORAL
  Filled 2017-04-30 (×2): qty 2

## 2017-04-30 MED ORDER — POTASSIUM CHLORIDE CRYS ER 10 MEQ PO TBCR: 10.0000 meq | EXTENDED_RELEASE_TABLET | Freq: Every day | ORAL | 0 refills | Status: DC

## 2017-04-30 MED ORDER — FOLIC ACID 1 MG PO TABS: 1.0000 mg | ORAL_TABLET | Freq: Every day | ORAL | 0 refills | Status: DC

## 2017-04-30 MED ORDER — SODIUM CHLORIDE 0.9 % IV SOLN
125.0000 mg | Freq: Once | INTRAVENOUS | Status: AC
  Administered 2017-04-30: 125 mg via INTRAVENOUS
  Filled 2017-04-30: qty 10

## 2017-04-30 MED ORDER — FUROSEMIDE 40 MG PO TABS
60.0000 mg | ORAL_TABLET | Freq: Every day | ORAL | Status: DC
  Administered 2017-04-30: 60 mg via ORAL
  Filled 2017-04-30: qty 1

## 2017-04-30 MED ORDER — FERROUS SULFATE 325 (65 FE) MG PO TABS: 325.0000 mg | ORAL_TABLET | Freq: Three times a day (TID) | ORAL | 0 refills | Status: DC

## 2017-04-30 MED ORDER — AMOXICILLIN-POT CLAVULANATE 875-125 MG PO TABS: 1.0000 | ORAL_TABLET | Freq: Two times a day (BID) | ORAL | 0 refills | Status: AC

## 2017-04-30 MED ORDER — POTASSIUM CHLORIDE CRYS ER 10 MEQ PO TBCR: 10.0000 meq | EXTENDED_RELEASE_TABLET | Freq: Every day | ORAL | Status: DC

## 2017-04-30 NOTE — Procedures (Signed)
ELECTROENCEPHALOGRAM REPORT  Date of Study: 04/30/2017  Patient's Name: Thomas [name] MRN: [id number] Date of Birth: September 26, 1961  Referring Provider: Dr. Oren Binet  Clinical History: This is a 56 year old man with altered mental status.  Medications: albuterol (PROVENTIL) (2.5 MG/3ML) 0.083% nebulizer solution 2.5 mg  ferric gluconate (NULECIT) 125 mg in sodium chloride 0.9 % 100 mL IVPB  ferrous sulfate tablet 919 mg  folic acid (FOLVITE) tablet 1 mg  furosemide (LASIX) tablet 60 mg  lactulose (CHRONULAC) 10 GM/15ML solution 30 g  pantoprazole (PROTONIX) injection 40 mg  phytonadione (VITAMIN K) 10 mg in dextrose 5 % 50 mL IVPB  piperacillin-tazobactam (ZOSYN) IVPB 3.375 g  spironolactone (ALDACTONE) tablet 100 mg   Technical Summary: A multichannel digital EEG recording measured by the international 10-20 system with electrodes applied with paste and impedances below 5000 ohms performed as portable with EKG monitoring in an awake and asleep patient.  Hyperventilation was not performed. Photic stimulation was performed.  The digital EEG was referentially recorded, reformatted, and digitally filtered in a variety of bipolar and referential montages for optimal display.   Description: The patient is awake and asleep during the recording.  During maximal wakefulness, there is a symmetric, medium voltage 7 Hz posterior dominant rhythm that attenuates with eye opening. This is admixed with a small amount of diffuse 4-6 Hz theta slowing of the waking background.  During drowsiness and sleep, there is an increase in theta slowing of the background.  Vertex waves and symmetric sleep spindles were seen.  Photic stimulation did not elicit any abnormalities.  There were no epileptiform discharges or electrographic seizures seen.    EKG lead was unremarkable.  Impression: This awake and asleep EEG is abnormal due to mild diffuse slowing of the waking background.  Clinical Correlation of the  above findings indicates diffuse cerebral dysfunction that is non-specific in etiology and can be seen with hypoxic/ischemic injury, toxic/metabolic encephalopathies, neurodegenerative disorders, medication effect, or due to excessive drowsiness.  The absence of epileptiform discharges does not rule out a clinical diagnosis of epilepsy.  Clinical correlation is advised.   Ellouise Newer, M.D.

## 2017-04-30 NOTE — Addendum Note (Signed)
Addended by: Rosanne Sack R on: 04/30/2017 12:45 PM   Modules accepted: Orders

## 2017-04-30 NOTE — Discharge Summary (Signed)
Physician Discharge Summary  Thomas [name]  AUQ:[id number]  DOB: 1961-09-10  DOA: 04/27/2017 PCP: Alroy Dust, L.Marlou Sa, MD  Admit date: 04/27/2017 Discharge date: 04/30/2017  Admitted From: Home  Disposition:  Home   Recommendations for Outpatient Follow-up:  1. Follow up with PCP in 1-2 weeks 2. Please obtain BMP/CBC in one week to monitor Hgb and Cr  3. Please follow up on the following pending results: Final blood cultures  4. Repeat CXR in 3-4 week to assure resolution of xray findings    Home Health: None  Equipment/Devices: None   Discharge Condition: Stable  CODE STATUS: DNR  Diet recommendation: Heart Healthy, Low Sodium    Brief/Interim Summary: 56 year old male with medical history significant of alcoholic liver cirrhosis, prior history of SBP, esophageal paresis was brought to the ED by his spouse due to confusion. In the emergency room he was found to be extremely confused and lethargic, chest x-ray showed right-sided pneumonia and he was found to have hemoglobin 6.9. Patient was admitted to the SDU, was transfuse 2 unit of PRBC's, started on IV abx (vanco and zosyn) and CT head was done with no cute abnormalities. Patient was found to have elevated ammonia levels, patient was continued on Lactulose. Over 24 hrs period patient was back to baseline. GI was consulted and EGD was done, which showed no acute bleeding but small esophageal varices and moderate to severe portal gastropathy which is likely causing chronic GI blood loss. Iron was recommended and dose of IV venofer was given. EEG was done which showed diffuse slowing consistent with toxic/metabolic encephalopathy. Patient had some abdominal distension, so US guide paracentesis was ordered which did not revealed significant amount of fluid. Patient has clinically improve and is tolerating diet well. Remains afebrile during hospital stay. Patient stable to be discharge home with close GI follow up.   Subjective: Patient seen and  examined this am. Continues to do well, only c/o abdominal distention. US guides paracentesis no enough fluid to remove. No acute events overnight.   Discharge Diagnoses/Hospital Course:  Acute metabolic encephalopathy - Resolved  Multifactorial from hepatic encephalopathy, PNA and hypoxia Initially treated with Zosyn and vancomycin  MRSA negative - vancomycin d/ced  Zosyn will be switch to Augmentin will complete 10 days of antibiotics  Continue Lactulose  Follow up blood cultures thus far negative  Strep Pneumo negative  Repeat CXR in 3-4 week to assure resolution of xray findings   HCAP  See above   Upper GI bleed  Acute blood loss anemia on chronic anemia from chronic diseases  S/p 2 unit PRBC's, during hospital stay EGD  showed no acute bleeding but small esophageal varices and moderate to severe portal gastropathy which is likely causing chronic GI blood loss. IV Iron given prior to discharge, prob would benefit from iron infusions as out patient  Hgb has been stable since transfusion  Repeat CBC in 1 week  Hypokalemia  Hypomagnesemia - resolved  HypoK likely due to chronic use of lactulose and loose stools  Was repleted via IV - will discharge on oral supplementation  Repeat BMP in 1 week   AKI on CKD stage III  Baseline Cr. 1.15 03/2017 - improved with IVF and blood transfusion  Likely from hypoperfusion due blood loss and third spacing albumin low Cr back to baseline  Check in BMP 1 week   Alcoholic liver cirrhosis with ascites  Patient on Cipro for pro[phylatic SBP will continue for now. [place] did not show enough fluid to remove -  need to follow up in 1 week with GI to re-ascess Per GI note: Patient slowly improving. Scheduled for paracentesis today for symptomatic relief, will also rule out SBP but has been on antibiotics for PNA. Suspect encephalopathy was worsened / caused by pneumonia, also with recent alcohol ingestion. He can't be on Rifaximin due to reported  allergic reaction in the past. Continue lactulose along with treatment for pneumonia, await paracentesis results. Needs complete alcohol cessation given his severe decompensated cirrhosis. Not a candidate for beta blocker given recent AKI on this regimen. Unfortunately also not a TIPs candidate given hepatic encephalopathy. Hopefully iron can stabilize anemia, which is due to portal hypertensive gastritis  All other chronic medical condition were stable during the hospitalization.  On the day of the discharge the patient's vitals were stable, and no other acute medical condition were reported by patient. Patient was felt safe to be discharge to home   Discharge Instructions  You were cared for by a hospitalist during your hospital stay. If you have any questions about your discharge medications or the care you received while you were in the hospital after you are discharged, you can call the unit and asked to speak with the hospitalist on call if the hospitalist that took care of you is not available. Once you are discharged, your primary care physician will handle any further medical issues. Please note that NO REFILLS for any discharge medications will be authorized once you are discharged, as it is imperative that you return to your primary care physician (or establish a relationship with a primary care physician if you do not have one) for your aftercare needs so that they can reassess your need for medications and monitor your lab values.  Discharge Instructions    Call MD for:  difficulty breathing, headache or visual disturbances    Complete by:  As directed    Call MD for:  extreme fatigue    Complete by:  As directed    Call MD for:  hives    Complete by:  As directed    Call MD for:  persistant dizziness or light-headedness    Complete by:  As directed    Call MD for:  persistant nausea and vomiting    Complete by:  As directed    Call MD for:  redness, tenderness, or signs of infection  (pain, swelling, redness, odor or green/yellow discharge around incision site)    Complete by:  As directed    Call MD for:  severe uncontrolled pain    Complete by:  As directed    Call MD for:  temperature >100.4    Complete by:  As directed    Diet - low sodium heart healthy    Complete by:  As directed    Discharge instructions    Complete by:  As directed    GI office on Tuesday for labs drawn.  Complete course of abx. Alcohol cessation strongly recommended   Increase activity slowly    Complete by:  As directed      Allergies as of 04/30/2017      Reactions   Rifaximin Swelling, Other (See Comments)   Reaction:  All over body swelling       Medication List    TAKE these medications   amoxicillin-clavulanate 875-125 MG tablet Commonly known as:  AUGMENTIN Take 1 tablet by mouth 2 (two) times daily.   bismuth subsalicylate 992 MG chewable tablet Commonly known as:  PEPTO BISMOL Chew  262-524 mg by mouth 3 (three) times daily as needed for indigestion or diarrhea or loose stools.   calcium carbonate 750 MG chewable tablet Commonly known as:  TUMS EX Chew 1-2 tablets by mouth 3 (three) times daily as needed for heartburn.   ciprofloxacin 500 MG tablet Commonly known as:  CIPRO Take 500 mg by mouth at bedtime.   ferrous sulfate 325 (65 FE) MG tablet Take 1 tablet (325 mg total) by mouth 3 (three) times daily with meals.   folic acid 1 MG tablet Commonly known as:  FOLVITE Take 1 tablet (1 mg total) by mouth daily. What changed:  when to take this   furosemide 40 MG tablet Commonly known as:  LASIX Take 1.5 tablets (60 mg total) by mouth daily.   lactulose 10 GM/15ML solution Commonly known as:  CHRONULAC Take 45 mLs (30 g total) by mouth 2 (two) times daily.   pantoprazole 40 MG tablet Commonly known as:  PROTONIX TAKE 1 TABLET (40 MG TOTAL) BY MOUTH 2 (TWO) TIMES DAILY.   potassium chloride 10 MEQ tablet Commonly known as:  K-DUR,KLOR-CON Take 1 tablet  (10 mEq total) by mouth daily. Start taking on:  05/01/2017   spironolactone 100 MG tablet Commonly known as:  ALDACTONE Take 1 tablet (100 mg total) by mouth daily.       Allergies  Allergen Reactions  . Rifaximin Swelling and Other (See Comments)    Reaction:  All over body swelling     Consultations:  GI - [place]    Procedures/Studies: Ct Head Wo Contrast  Result Date: 04/27/2017 CLINICAL DATA:  Confusion. EXAM: CT HEAD WITHOUT CONTRAST TECHNIQUE: Contiguous axial images were obtained from the base of the skull through the vertex without intravenous contrast. COMPARISON:  None. FINDINGS: Brain: There is low attenuation within the periventricular and subcortical white matter. Prominence of the sulci and ventricles identified compatible with brain atrophy. No abnormal extra-axial fluid collection, intracranial hemorrhage or mass noted. No acute infarction. Vascular: No hyperdense vessel or unexpected calcification. Skull: Normal. Negative for fracture or focal lesion. Sinuses/Orbits: No acute finding. Other: None. IMPRESSION: 1. No acute intracranial abnormality. 2. Chronic small vessel ischemic change and brain atrophy. Electronically Signed   By: Kerby Moors M.D.   On: 04/27/2017 09:39   Dg Chest Port 1 View  Result Date: 04/27/2017 CLINICAL DATA:  Cough. EXAM: PORTABLE CHEST 1 VIEW COMPARISON:  Radiographs of March 27, 2017. FINDINGS: Stable cardiomediastinal silhouette. No pneumothorax is noted. Left lung is clear. Increased right basilar opacity is noted concerning for worsening atelectasis or infiltrate with associated pleural effusion. Bony thorax is unremarkable. IMPRESSION: Increased right basilar atelectasis or infiltrate with associated pleural effusion. Electronically Signed   By: Marijo Conception, M.D.   On: 04/27/2017 09:10   EEG 04/29/17  Discharge Exam: Vitals:   04/29/17 2138 04/30/17 0544  BP: 113/61 120/66  Pulse: 90 84  Resp: 18 16  Temp: 98 F (36.7 C)  98.8 F (37.1 C)   Vitals:   04/29/17 1600 04/29/17 1645 04/29/17 2138 04/30/17 0544  BP: (!) 115/56 115/61 113/61 120/66  Pulse:  77 90 84  Resp: 19 18 18 16   Temp:  98.4 F (36.9 C) 98 F (36.7 C) 98.8 F (37.1 C)  TempSrc:  Oral Oral Oral  SpO2: 96% 99% 98% 96%  Weight:  92.6 kg (204 lb 2.3 oz)    Height:  5\' 5"  (1.651 m)      General: Pt is alert, awake,  not in acute distress Cardiovascular: RRR, S1/S2 +, no rubs, no gallops Respiratory: Good air entry, scattered rhonchi, No wheezing Abdominal:  Abdomen distended, soft, NT + BS Extremities: trace LE edema, no cyanosis   The results of significant diagnostics from this hospitalization (including imaging, microbiology, ancillary and laboratory) are listed below for reference.     Microbiology: Recent Results (from the past 240 hour(s))  Culture, blood (routine x 2)     Status: None (Preliminary result)   Collection Time: 04/27/17  9:59 AM  Result Value Ref Range Status   Specimen Description BLOOD RIGHT WRIST  Final   Special Requests   Final    BOTTLES DRAWN AEROBIC AND ANAEROBIC Blood Culture adequate volume   Culture   Final    NO GROWTH 3 DAYS Performed at [facility], [address], [place], [place] [address]    Report Status PENDING  Incomplete  Culture, blood (routine x 2)     Status: None (Preliminary result)   Collection Time: 04/27/17  3:39 PM  Result Value Ref Range Status   Specimen Description BLOOD LEFT ANTECUBITAL  Final   Special Requests   Final    BOTTLES DRAWN AEROBIC AND ANAEROBIC Blood Culture adequate volume   Culture   Final    NO GROWTH 3 DAYS Performed at [facility], [place] [address] [place], [place] [address]    Report Status PENDING  Incomplete  MRSA PCR Screening     Status: None   Collection Time: 04/27/17  7:01 PM  Result Value Ref Range Status   MRSA by PCR NEGATIVE NEGATIVE Final    Comment:        The GeneXpert MRSA Assay (FDA approved for NASAL  specimens only), is one component of a comprehensive MRSA colonization surveillance program. It is not intended to diagnose MRSA infection nor to guide or monitor treatment for MRSA infections.      Labs: BNP (last 3 results)  Recent Labs  03/27/17 2051  BNP 025.8*   Basic Metabolic Panel:  Recent Labs Lab 04/25/17 1638 04/27/17 0850 04/28/17 0736 04/29/17 0618 04/30/17 0401  NA 132* 134* 136 136 135  K 3.1* 2.9* 3.1* 3.0* 3.3*  CL 91* 92* 96* 96* 100*  CO2 29 30 30 29 27   GLUCOSE 216* 128* 114* 116* 149*  BUN 12 11 12 11 9   CREATININE 1.55* 1.51* 1.61* 1.22 0.93  CALCIUM 7.2* 7.5* 7.2* 7.6* 7.5*  MG  --   --  1.2* 1.7  --   PHOS  --   --   --  3.0  --    Liver Function Tests:  Recent Labs Lab 04/27/17 0850 04/29/17 0618 04/30/17 0401  AST 77* 65* 63*  ALT 33 27 26  ALKPHOS 132* 88 101  BILITOT 7.0* 9.4* 7.0*  PROT 6.1* 5.2* 5.0*  ALBUMIN 2.5* 2.0* 1.9*   No results for input(s): LIPASE, AMYLASE in the last 168 hours.  Recent Labs Lab 04/27/17 0850  AMMONIA 87*   CBC:  Recent Labs Lab 04/28/17 0736 04/28/17 1547 04/28/17 2105 04/29/17 0618 04/30/17 0401  WBC 8.7 8.2 7.6 6.2 5.7  NEUTROABS  --   --   --   --  2.9  HGB 7.1* 8.5* 8.2* 8.3* 8.2*  HCT 21.1* 25.3* 23.9* 24.9* 24.5*  MCV 114.7* 107.7* 106.7* 107.3* 108.4*  PLT 69* 78* 73* 74* 73*   Cardiac Enzymes: No results for input(s): CKTOTAL, CKMB, CKMBINDEX, TROPONINI in the last 168  hours. BNP: Invalid input(s): POCBNP CBG:  Recent Labs Lab 04/27/17 0845 04/28/17 0823 04/28/17 1122 04/28/17 1500 04/30/17 0813  GLUCAP 126* 121* 151* 140* 130*   D-Dimer No results for input(s): DDIMER in the last 72 hours. Hgb A1c No results for input(s): HGBA1C in the last 72 hours. Lipid Profile No results for input(s): CHOL, HDL, LDLCALC, TRIG, CHOLHDL, LDLDIRECT in the last 72 hours. Thyroid function studies No results for input(s): TSH, T4TOTAL, T3FREE, THYROIDAB in the last 72  hours.  Invalid input(s): FREET3 Anemia work up No results for input(s): VITAMINB12, FOLATE, FERRITIN, TIBC, IRON, RETICCTPCT in the last 72 hours. Urinalysis    Component Value Date/Time   COLORURINE AMBER (A) 04/30/2017 0759   APPEARANCEUR CLEAR 04/30/2017 0759   LABSPEC 1.023 04/30/2017 0759   PHURINE 6.0 04/30/2017 0759   GLUCOSEU NEGATIVE 04/30/2017 0759   HGBUR NEGATIVE 04/30/2017 0759   BILIRUBINUR NEGATIVE 04/30/2017 0759   KETONESUR NEGATIVE 04/30/2017 0759   PROTEINUR NEGATIVE 04/30/2017 0759   UROBILINOGEN 0.2 11/22/2012 0856   NITRITE NEGATIVE 04/30/2017 0759   LEUKOCYTESUR NEGATIVE 04/30/2017 0759   Sepsis Labs Invalid input(s): PROCALCITONIN,  WBC,  LACTICIDVEN Microbiology Recent Results (from the past 240 hour(s))  Culture, blood (routine x 2)     Status: None (Preliminary result)   Collection Time: 04/27/17  9:59 AM  Result Value Ref Range Status   Specimen Description BLOOD RIGHT WRIST  Final   Special Requests   Final    BOTTLES DRAWN AEROBIC AND ANAEROBIC Blood Culture adequate volume   Culture   Final    NO GROWTH 3 DAYS Performed at [facility], [place] [address], [place], [place] [address]    Report Status PENDING  Incomplete  Culture, blood (routine x 2)     Status: None (Preliminary result)   Collection Time: 04/27/17  3:39 PM  Result Value Ref Range Status   Specimen Description BLOOD LEFT ANTECUBITAL  Final   Special Requests   Final    BOTTLES DRAWN AEROBIC AND ANAEROBIC Blood Culture adequate volume   Culture   Final    NO GROWTH 3 DAYS Performed at [facility], [place] [address], [place], [place] [address]    Report Status PENDING  Incomplete  MRSA PCR Screening     Status: None   Collection Time: 04/27/17  7:01 PM  Result Value Ref Range Status   MRSA by PCR NEGATIVE NEGATIVE Final    Comment:        The GeneXpert MRSA Assay (FDA approved for NASAL specimens only), is one component of a comprehensive MRSA  colonization surveillance program. It is not intended to diagnose MRSA infection nor to guide or monitor treatment for MRSA infections.      Time coordinating discharge: 35 minutes  SIGNED:  Chipper Oman, MD  [facility] 04/30/2017, 2:04 PM  Pager please text page via  [email or web address] Password [email or web address]

## 2017-04-30 NOTE — Progress Notes (Signed)
EEG Completed; Results Pending  

## 2017-04-30 NOTE — Telephone Encounter (Signed)
Called to follow-up on [facility] appt. They attempted to call pt 2 times and then a letter was sent out. Per [facility] the pt will now have to call them at [phone] and then his option 1 and 2 to schedule the appt. Pt also needs to come next Tuesday to have labs drawn. Orders in epic.

## 2017-04-30 NOTE — Progress Notes (Signed)
Patient ID: Thomas [name], male   DOB: Nov 21, 1961, 56 y.o.   MRN: [id number] Pt presented to [place] dept today for paracentesis. On limited US abd in all four quadrants there is only a small amount of ascites present and not enough to safely aspirate at this time due to close proximity to bowel loops. Can reevaluate volume in few days if needed.

## 2017-04-30 NOTE — Progress Notes (Addendum)
Agree with assessment and plan as outlined in the note. Patient slowly improving. Scheduled for paracentesis today for symptomatic relief, will also rule out SBP but has been on antibiotics for PNA. Suspect encephalopathy was worsened / caused by pneumonia, also with recent alcohol ingestion. He can't be on Rifaximin due to reported allergic reaction in the past. Continue lactulose along with treatment for pneumonia, await paracentesis results. Needs complete alcohol cessation given his severe decompensated cirrhosis. Not a candidate for beta blocker given recent AKI on this regimen. Unfortunately also not a TIPs candidate given hepatic encephalopathy. Hopefully iron can stabilize anemia, which is due to portal hypertensive gastritis.  Childress Cellar, MD [facility] Pager [phone]          Patient ID: Thomas [name], male   DOB: 04/24/1961, 56 y.o.   MRN: [id number]    Progress Note   Subjective   Feeling much better-mentating well . Wife at bedside Hungry, only c/o some cough , and abdominal fullness preventing him from eating much  Waiting for paracentesis this am RLL pneumonia/effusion  EGD yesterday with grade ! Varices, and diffuse portal gastropathy  Which is source of his chronic blood loss   Objective   Vital signs in last 24 hours: Temp:  [97.5 F (36.4 C)-98.8 F (37.1 C)] 98.8 F (37.1 C) (05/21 0544) Pulse Rate:  [77-90] 84 (05/21 0544) Resp:  [16-27] 16 (05/21 0544) BP: (113-120)/(56-66) 120/66 (05/21 0544) SpO2:  [96 %-99 %] 96 % (05/21 0544) Weight:  [204 lb 2.3 oz (92.6 kg)] 204 lb 2.3 oz (92.6 kg) (05/20 1645) Last BM Date: 04/28/17 General:    white male in NAD Heart:  Regular rate and rhythm; no murmurs Lungs: Respirations even and unlabored, lungs with significant decrease  in BS bilat Abdomen:  Soft, distended with ascites, nontender . Normal bowel sounds. Extremities:  2 + edema bilat LE Neurologic:  Alert and oriented,  grossly  normal neurologically, no asterixis , oriented x 3 Psych:  Cooperative. Normal mood and affect.  Intake/Output from previous day: 05/20 0701 - 05/21 0700 In: 980 [P.O.:480; I.V.:450; IV Piggyback:50] Out: -  Intake/Output this shift: No intake/output data recorded.  Lab Results:  Recent Labs  04/28/17 2105 04/29/17 0618 04/30/17 0401  WBC 7.6 6.2 5.7  HGB 8.2* 8.3* 8.2*  HCT 23.9* 24.9* 24.5*  PLT 73* 74* 73*   BMET  Recent Labs  04/28/17 0736 04/29/17 0618 04/30/17 0401  NA 136 136 135  K 3.1* 3.0* 3.3*  CL 96* 96* 100*  CO2 30 29 27   GLUCOSE 114* 116* 149*  BUN 12 11 9   CREATININE 1.61* 1.22 0.93  CALCIUM 7.2* 7.6* 7.5*   LFT  Recent Labs  04/29/17 0618 04/30/17 0401  PROT 5.2* 5.0*  ALBUMIN 2.0* 1.9*  AST 65* 63*  ALT 27 26  ALKPHOS 88 101  BILITOT 9.4* 7.0*  BILIDIR 2.3*  --   IBILI 7.1*  --    PT/INR  Recent Labs  04/29/17 0618 04/30/17 0401  LABPROT 25.1* 27.6*  INR 2.24 2.51         Assessment / Plan:    #1 56 yo WM Alcoholic with decompensated ETOH liver disease with MELD of 48 Actively drinking until one month ago, admitted with obtundation Acute hepatic encephalopathy  Probably exacerbated by pneumonia Much improved-  #2 RLL pneumonia- on Zosyn day #4 #4 profound anemia- recurrent - secondary to diffuse portal gastropathy- not on b-blocker due to recent AKI-  hgb stable at 8.2 #  5 Ascites- for paracentesis today - 3-4 liters and cell count to r/o SBP though has been on IV ABX  #6 mild hypokalemia-correct #7 peripheral edema/ascites- On Lasix 60, Aldactone 100 mg daily currently  Plan; Await paracentesis and cell counts before deciding on D/C   Continue Lactulose current dose  Cannot use Xifaxan - had reaction with marked increase in peripheral edema  Continue lasix 60 mg daily Continue Aldactone 100 mg daily 2 gm sodium diet Will need close outpt  follow up  Principal Problem:   Encephalopathy acute Active Problems:    Upper GI bleed   Coagulopathy (HCC)   Thrombocytopenia (HCC)   Acute blood loss anemia   Alcoholic cirrhosis of liver with ascites ([facility])   HCAP (healthcare-associated pneumonia)     LOS: 3 days   Amy Esterwood  04/30/2017, 10:20 AM

## 2017-05-01 ENCOUNTER — Telehealth: Payer: Self-pay

## 2017-05-01 NOTE — Telephone Encounter (Signed)
-----   Message from Alfredia Ferguson, PA-C sent at 04/30/2017 12:14 PM EDT ----- Regarding: Thomas [name]- labs Please call pt tomorrow and tell him to come for labs next Tuesday, and remind him of appt with Py[facility] on 6/4.Marland Kitchen Thanks!  Labs- CBC, CMET, ammonia, INR

## 2017-05-01 NOTE — Telephone Encounter (Signed)
Spoke with pts wife and she is aware that pt needs labs next Tuesday, orders are in epic. Also reminded her about the OV with Dr. Hilarie Fredrickson in June. Wife instructed to call [facility] at [phone], option 1 and 2 to schedule his appt. Per [facility] they have called and left 2 messages and sent a letter regarding scheduling an appt. Now they have to call [facility]. Wife aware and verbalized understanding.

## 2017-05-02 LAB — CULTURE, BLOOD (ROUTINE X 2)
CULTURE: NO GROWTH
Culture: NO GROWTH
Special Requests: ADEQUATE
Special Requests: ADEQUATE

## 2017-05-09 ENCOUNTER — Other Ambulatory Visit (INDEPENDENT_AMBULATORY_CARE_PROVIDER_SITE_OTHER): Payer: BLUE CROSS/BLUE SHIELD

## 2017-05-09 ENCOUNTER — Encounter: Payer: Self-pay | Admitting: *Deleted

## 2017-05-09 DIAGNOSIS — K7460 Unspecified cirrhosis of liver: Secondary | ICD-10-CM

## 2017-05-09 LAB — CBC WITH DIFFERENTIAL/PLATELET
BASOS PCT: 0.8 % (ref 0.0–3.0)
Basophils Absolute: 0.1 10*3/uL (ref 0.0–0.1)
EOS PCT: 3.7 % (ref 0.0–5.0)
Eosinophils Absolute: 0.4 10*3/uL (ref 0.0–0.7)
HCT: 24.1 % — ABNORMAL LOW (ref 39.0–52.0)
Hemoglobin: 8.3 g/dL — ABNORMAL LOW (ref 13.0–17.0)
LYMPHS ABS: 1.3 10*3/uL (ref 0.7–4.0)
Lymphocytes Relative: 13.1 % (ref 12.0–46.0)
MCHC: 34.4 g/dL (ref 30.0–36.0)
MCV: 111.6 fl — AB (ref 78.0–100.0)
MONO ABS: 1.4 10*3/uL — AB (ref 0.1–1.0)
MONOS PCT: 15 % — AB (ref 3.0–12.0)
NEUTROS ABS: 6.5 10*3/uL (ref 1.4–7.7)
NEUTROS PCT: 67.4 % (ref 43.0–77.0)
RBC: 2.16 Mil/uL — AB (ref 4.22–5.81)
RDW: 22.4 % — AB (ref 11.5–15.5)
WBC: 9.6 10*3/uL (ref 4.0–10.5)

## 2017-05-09 LAB — COMPREHENSIVE METABOLIC PANEL
ALBUMIN: 2.5 g/dL — AB (ref 3.5–5.2)
ALT: 22 U/L (ref 0–53)
AST: 50 U/L — AB (ref 0–37)
Alkaline Phosphatase: 149 U/L — ABNORMAL HIGH (ref 39–117)
BUN: 8 mg/dL (ref 6–23)
CHLORIDE: 103 meq/L (ref 96–112)
CO2: 28 mEq/L (ref 19–32)
CREATININE: 1.2 mg/dL (ref 0.40–1.50)
Calcium: 8.4 mg/dL (ref 8.4–10.5)
GFR: 66.83 mL/min (ref >60.00)
Glucose, Bld: 118 mg/dL — ABNORMAL HIGH (ref 70–99)
POTASSIUM: 3.9 meq/L (ref 3.5–5.1)
SODIUM: 136 meq/L (ref 135–145)
Total Bilirubin: 6.7 mg/dL — ABNORMAL HIGH (ref 0.2–1.2)
Total Protein: 6.4 g/dL (ref 6.0–8.3)

## 2017-05-09 LAB — PROTIME-INR
INR: 2.5 ratio — AB (ref 0.8–1.0)
Prothrombin Time: 27.1 s — ABNORMAL HIGH (ref 9.6–13.1)

## 2017-05-09 LAB — AMMONIA: Ammonia: 31 umol/L (ref 11–35)

## 2017-05-11 NOTE — Addendum Note (Signed)
Addendum  created 05/11/17 1358 by Rica Koyanagi, MD   Sign clinical note

## 2017-05-14 ENCOUNTER — Ambulatory Visit (INDEPENDENT_AMBULATORY_CARE_PROVIDER_SITE_OTHER): Payer: BLUE CROSS/BLUE SHIELD | Admitting: Internal Medicine

## 2017-05-14 ENCOUNTER — Encounter: Payer: Self-pay | Admitting: Internal Medicine

## 2017-05-14 VITALS — BP 144/70 | HR 104 | Ht 65.0 in | Wt 195.2 lb

## 2017-05-14 DIAGNOSIS — K766 Portal hypertension: Secondary | ICD-10-CM | POA: Diagnosis not present

## 2017-05-14 DIAGNOSIS — D500 Iron deficiency anemia secondary to blood loss (chronic): Secondary | ICD-10-CM

## 2017-05-14 DIAGNOSIS — K7031 Alcoholic cirrhosis of liver with ascites: Secondary | ICD-10-CM

## 2017-05-14 DIAGNOSIS — K3189 Other diseases of stomach and duodenum: Secondary | ICD-10-CM

## 2017-05-14 MED ORDER — LACTULOSE 10 GM/15ML PO SOLN: 20.0000 g | Freq: Two times a day (BID) | ORAL | 1 refills | Status: DC

## 2017-05-14 NOTE — Patient Instructions (Addendum)
Please keep your appointment at [facility] Wednesday.  Your physician has requested that you go to the basement for the following lab work before leaving today: CBC, INR, CMP  Decrease your lactulose to 30 ml (20 grams) twice daily. We have sent a new prescription to your pharmacy for this.  Continue to abstain from alcohol.  Continue to follow a low sodium diet (less than 2 grams daily).  Continue furosemide (lasix) and spironolactone (aldactone) at their current doses.  Continue cipro.  Continue oral iron supplement.  You have been scheduled for a paracentesis at [facility] (1st floor) on Tuesday, 05/15/17 @ 1:00 pm. Arrive at 12:45 pm for registration.  You have been scheduled for a follow up with Dr. Zenovia Jarred on 07/06/17 @ 1:45 pm.

## 2017-05-14 NOTE — Progress Notes (Signed)
Subjective:    Patient ID: Thomas [name], male    DOB: 1961/08/07, 56 y.o.   MRN: [id number]  HPI Christobal Morado is a 56 year old male with alcoholic cirrhosis complicated by portal hypertension with history of small esophageal varices, portal hypertensive gastropathy, hepatic encephalopathy, ascites with history of SBP, issues with acute kidney injury and renal dysfunction, anemia who is here for follow-up. He was hospitalized from 04/27/2017 to 04/30/2017 for healthcare associated pneumonia, upper GI bleed due to severe portal gastropathy, acute on chronic kidney injury with electrolyte imbalance. He had an upper endoscopy on 04/29/2017 performed by Dr. Ardis Hughs.  EGD on 04/29/2017 showed trace to small distal esophageal varices, and moderate to severe portal gastropathy. He was not a candidate for beta blocker given his acute kidney injury and recent renal insufficiency.  He has been using Lasix 60 mg and Aldactone 100 mg daily. Lactulose 30 g twice a day, pantoprazole 40 mg twice a day, ciprofloxacin 500 mg daily at bedtime, oral iron started recently and potassium chloride 10 mEq daily.  He reports that he is feeling better though gets fatigued easily. He is working full-time now at more of a desk job. He is working for a [employer]. He reports he still has issues with increasing abdominal girth and lower extremity edema though he has lost about 5 pounds recently. He attributes this to the diuretics. He is trying to eat a low-sodium diet. He reports that he has been alcohol abstinent. He reports had a visit with Nevin Bloodgood about 6 weeks ago he had taken a sleep medicine which contained alcohol. He reports being unaware of this. Other than this he denies any alcoholic beverages since January 2018. He reports he slipped into relapse with alcohol abuse when he and his wife briefly separated in January 2018.  He reports that he is having frequent bowel movements with the lactulose. Bowel  movements as frequent as every 2 hours on current dose. Denies blood in his stool and melena recently.  He denies confusion with lactulose. He is still driving.  He has an appointment at [facility] with hepatology this Wednesday, 05/16/2017 (on our recommendation)  Review of Systems As per history of present illness, otherwise negative  Current Medications, Allergies, Past Medical History, Past Surgical History, Family History and Social History were reviewed in [employer] record.     Objective:   Physical Exam BP (!) 144/70   Pulse (!) 104   Ht 5\' 5"  (1.651 m)   Wt 195 lb 3.2 oz (88.5 kg)   SpO2 96%   BMI 32.48 kg/m  Constitutional: Well-developed and Chronically ill-appearing No distress. HEENT: Normocephalic and atraumatic. Oropharynx is clear and moist. Conjunctivae are normal.  Positive scleral icterus Neck: Neck supple. Trachea midline. Cardiovascular: Mild tachycardia, regular rhythm Pulmonary/chest: Effort normal and breath sounds normal. No wheezing, rales or rhonchi. Abdominal: Soft, nontender, moderately distended but not tense,  Bowel sounds active throughout. Extremities: no clubbing, cyanosis, 2+ LE edema with tautness bilaterally Neurological: Alert and oriented to person place and time. No asterixis Psychiatric: Normal mood and affect. Behavior is normal.  CBC    Component Value Date/Time   WBC 9.6 05/09/2017 1622   RBC 2.16 (L) 05/09/2017 1622   HGB 8.3 Repeated and verified X2. (L) 05/09/2017 1622   HCT 24.1 Repeated and verified X2. (L) 05/09/2017 1622   PLT 111.0 Repeated and verified X2. (L) 05/09/2017 1622   MCV 111.6 (H) 05/09/2017 1622   MCH 36.3 (H)  04/30/2017 0401   MCHC 34.4 05/09/2017 1622   RDW 22.4 (H) 05/09/2017 1622   LYMPHSABS 1.3 05/09/2017 1622   MONOABS 1.4 (H) 05/09/2017 1622   EOSABS 0.4 05/09/2017 1622   BASOSABS 0.1 05/09/2017 1622   CMP     Component Value Date/Time   NA 136 05/09/2017 1622   K 3.9  05/09/2017 1622   CL 103 05/09/2017 1622   CO2 28 05/09/2017 1622   GLUCOSE 118 (H) 05/09/2017 1622   BUN 8 05/09/2017 1622   CREATININE 1.20 05/09/2017 1622   CALCIUM 8.4 05/09/2017 1622   PROT 6.4 05/09/2017 1622   ALBUMIN 2.5 (L) 05/09/2017 1622   AST 50 (H) 05/09/2017 1622   ALT 22 05/09/2017 1622   ALKPHOS 149 (H) 05/09/2017 1622   BILITOT 6.7 (H) 05/09/2017 1622   GFRNONAA >60 04/30/2017 0401   GFRAA >60 04/30/2017 0401   Lab Results  Component Value Date   INR 2.5 (H) 05/09/2017   INR 2.51 04/30/2017   INR 2.24 04/29/2017   FINDINGS: Gallbladder:   The gallbladder is contracted. No gallstones or wall thickening visualized. No sonographic Murphy sign noted by sonographer.   Common bile duct:   Diameter: 3.8 mm, normal.   Liver:   Diffuse increased echogenicity with slight nodularity of the liver contour consistent with cirrhosis. Diffuse ascites. No focal lesions.   IMPRESSION: No acute abnormalities.  Cirrhosis with ascites.  No focal lesions.     Electronically Signed   By: Lorriane Shire M.D.   On: 03/05/2017 13:28     Assessment & Plan:  56 year old male with alcoholic cirrhosis complicated by portal hypertension with history of small esophageal varices, portal hypertensive gastropathy, hepatic encephalopathy, ascites with history of SBP, issues with acute kidney injury and renal dysfunction, anemia who is here for follow-up.  1. Alcohol cirrhosis with portal hypertension (ascites, small varices, portal gastropathy, encephalopathy) -- he remains decompensated but reports to me that he has been alcohol abstinent. Fortunately, on repeated recommendation, he has scheduled evaluation with [facility] hepatology. He does need transplant evaluation given his severely decompensated liver disease. We discussed this frankly today and I have made him aware that his life expectancy without transplant is limited. I also stressed the importance of complete and total alcohol  abstinence as well as enrolling in relapse prevention. --Labs requested today including CBC, CMP and INR -- Tannen came out of the room quickly after I left stating that his house alarm was going off and he needed to leave urgently. He reported he would come back tomorrow for lab work --No alcohol --Low sodium diet --[facility] hepatology appointment 05/16/2017 --Reduce lactulose to 30 mL twice a day due to frequent loose stools --Continue Lasix 60 mg and Aldactone 100 mg daily; if renal function stable will increase doses slowly --Repeat large volume paracentesis with IV albumin arranged for ascites, appointment to be scheduled today with radiology --[facility] screening -- up-to-date with ultrasound in March, repeat in September 2018 --Small esophageal varices, severe portal gastropathy May 2018 -- beta blocker contraindicated given recent acute kidney injury and concern for hepatorenal syndrome. Twice a day PPI. Not currently a candidate for TIPS --Continue Cipro 500 mg daily at bedtime for SBP prophylaxis  2. Anemia -- multifactorial but likely secondary to iron deficiency from portal gastropathy. Continue oral iron replacement.  3. CRC screening -- up-to-date with colonoscopy. Repeat colonoscopy due and recommended November 2019

## 2017-05-15 ENCOUNTER — Encounter (HOSPITAL_COMMUNITY): Payer: Self-pay | Admitting: General Surgery

## 2017-05-15 ENCOUNTER — Ambulatory Visit (HOSPITAL_COMMUNITY)
Admission: RE | Admit: 2017-05-15 | Discharge: 2017-05-15 | Disposition: A | Discharge status: Home / Self Care | Payer: BLUE CROSS/BLUE SHIELD | Source: Ambulatory Visit | Attending: Internal Medicine | Admitting: Internal Medicine

## 2017-05-15 DIAGNOSIS — K7031 Alcoholic cirrhosis of liver with ascites: Secondary | ICD-10-CM | POA: Diagnosis present

## 2017-05-15 DIAGNOSIS — R188 Other ascites: Secondary | ICD-10-CM | POA: Diagnosis not present

## 2017-05-15 HISTORY — PX: IR PARACENTESIS: IMG2679

## 2017-05-15 MED ORDER — ALBUMIN HUMAN 25 % IV SOLN: 75.0000 g | Freq: Once | INTRAVENOUS | Status: DC

## 2017-05-15 MED ORDER — LIDOCAINE HCL 1 % IJ SOLN
INTRAMUSCULAR | Status: DC | PRN
  Administered 2017-05-15: 10 mL

## 2017-05-15 MED ORDER — LIDOCAINE HCL 1 % IJ SOLN
INTRAMUSCULAR | Status: AC
  Filled 2017-05-15: qty 20

## 2017-05-15 MED ORDER — ALBUMIN HUMAN 25 % IV SOLN
25.0000 g | Freq: Once | INTRAVENOUS | Status: AC
  Administered 2017-05-15: 25 g via INTRAVENOUS
  Filled 2017-05-15: qty 100

## 2017-05-15 NOTE — Procedures (Signed)
Ultrasound-guided therapeutic paracentesis performed yielding 3 liters of serous colored fluid. No immediate complications.  Allura Doepke E 2:00 PM 05/15/2017

## 2017-05-16 ENCOUNTER — Ambulatory Visit (HOSPITAL_COMMUNITY): Payer: BLUE CROSS/BLUE SHIELD

## 2017-05-18 ENCOUNTER — Other Ambulatory Visit (INDEPENDENT_AMBULATORY_CARE_PROVIDER_SITE_OTHER): Payer: BLUE CROSS/BLUE SHIELD

## 2017-05-18 DIAGNOSIS — K7031 Alcoholic cirrhosis of liver with ascites: Secondary | ICD-10-CM

## 2017-05-18 LAB — COMPREHENSIVE METABOLIC PANEL
ALBUMIN: 2.6 g/dL — AB (ref 3.5–5.2)
ALT: 21 U/L (ref 0–53)
AST: 45 U/L — AB (ref 0–37)
Alkaline Phosphatase: 140 U/L — ABNORMAL HIGH (ref 39–117)
BILIRUBIN TOTAL: 7.3 mg/dL — AB (ref 0.2–1.2)
BUN: 6 mg/dL (ref 6–23)
CO2: 27 mEq/L (ref 19–32)
CREATININE: 1.17 mg/dL (ref 0.40–1.50)
Calcium: 8.2 mg/dL — ABNORMAL LOW (ref 8.4–10.5)
Chloride: 99 mEq/L (ref 96–112)
GFR: 68.8 mL/min (ref >60.00)
Glucose, Bld: 115 mg/dL — ABNORMAL HIGH (ref 70–99)
Potassium: 3.9 mEq/L (ref 3.5–5.1)
SODIUM: 133 meq/L — AB (ref 135–145)
TOTAL PROTEIN: 6.3 g/dL (ref 6.0–8.3)

## 2017-05-18 LAB — CBC WITH DIFFERENTIAL/PLATELET
BASOS ABS: 0 10*3/uL (ref 0.0–0.1)
Basophils Relative: 0.4 % (ref 0.0–3.0)
EOS ABS: 0.3 10*3/uL (ref 0.0–0.7)
Eosinophils Relative: 3 % (ref 0.0–5.0)
HCT: 24.1 % — ABNORMAL LOW (ref 39.0–52.0)
HEMOGLOBIN: 8.2 g/dL — AB (ref 13.0–17.0)
LYMPHS PCT: 13.4 % (ref 12.0–46.0)
Lymphs Abs: 1.3 10*3/uL (ref 0.7–4.0)
MCHC: 34.2 g/dL (ref 30.0–36.0)
MCV: 113.7 fl — ABNORMAL HIGH (ref 78.0–100.0)
MONO ABS: 1.4 10*3/uL — AB (ref 0.1–1.0)
Monocytes Relative: 14.5 % — ABNORMAL HIGH (ref 3.0–12.0)
Neutro Abs: 6.6 10*3/uL (ref 1.4–7.7)
Neutrophils Relative %: 68.7 % (ref 43.0–77.0)
Platelets: 113 10*3/uL — ABNORMAL LOW (ref 150.0–400.0)
RBC: 2.12 Mil/uL — AB (ref 4.22–5.81)
RDW: 19.6 % — AB (ref 11.5–15.5)
WBC: 9.6 10*3/uL (ref 4.0–10.5)

## 2017-05-18 LAB — PROTIME-INR
INR: 3 ratio — AB (ref 0.8–1.0)
Prothrombin Time: 32.4 s — ABNORMAL HIGH (ref 9.6–13.1)

## 2017-05-29 ENCOUNTER — Ambulatory Visit: Payer: BLUE CROSS/BLUE SHIELD | Admitting: Nurse Practitioner

## 2017-06-01 ENCOUNTER — Ambulatory Visit
Admission: RE | Admit: 2017-06-01 | Discharge: 2017-06-01 | Disposition: A | Discharge status: Home / Self Care | Payer: BLUE CROSS/BLUE SHIELD | Source: Ambulatory Visit | Attending: Family Medicine | Admitting: Family Medicine

## 2017-06-01 ENCOUNTER — Other Ambulatory Visit: Payer: Self-pay | Admitting: Family Medicine

## 2017-06-01 DIAGNOSIS — R05 Cough: Secondary | ICD-10-CM

## 2017-06-01 DIAGNOSIS — R059 Cough, unspecified: Secondary | ICD-10-CM

## 2017-06-01 DIAGNOSIS — K3189 Other diseases of stomach and duodenum: Secondary | ICD-10-CM | POA: Diagnosis not present

## 2017-06-01 DIAGNOSIS — Z23 Encounter for immunization: Secondary | ICD-10-CM | POA: Diagnosis not present

## 2017-06-05 NOTE — Progress Notes (Signed)
[name] Consultation      Assessment and Plan:  The patient is 56 year old male with alcoholic liver cirrhosis, being evaluated for progressive right-sided pleural effusion.  Right pleural effusion. --Will send for US guided paracentesis, if this is not possible will send for right thoracentesis.   -Patient is advised that if his breathing is still short after a paracentesis, he will call us for repeat chest x-ray. If this chest x-ray shows continued significant pleural effusion, will refer for a thoracentesis.  Elevated right diaphragm. -Likely contributing to findings on chest x-ray.  Date: 06/05/2017  MRN# [id number] Thomas [name] [birth year]    Thomas [name] is a 56 y.o. old male seen in consultation for chief complaint of:    Chief Complaint  Patient presents with  . Advice Only    referred by Dr. [name]  . Pleural Effusion    Pt c/o sob with exhertion, he had pneumonia 2 mos ago and sob never resolved    HPI:  The patient is a 56 year old male with a history of alcoholic cirrhosis. He was previously followed [facility], but more recently is following up with [facility] for transplant evaluation. He is recently discharged from [facility] on 04/30/17 after admission for pneumonia, GI bleeding, likely from portal gastropathy. The patient had an ultrasound abdomen, 04/30/17, report reviewed, this indicated the patient had a small amount of ascites, this was not amenable to paracentesis due to adjacent bowel loops, therefore, paracentesis was not performed.  He has been having persistent cough and dyspnea for the past few months. He was treated for pneumonia in the hospital and felt that helped but is now feeling worse again. He has a paracentesis on 05/15/17, he feels since that time he has reaccumulated more fluid in his belly again.   I personally reviewed films; chest x-ray 06/01/17. There is a moderate right-sided pleural effusion. This has been progressively increasing  over the last 6 months, was present minimally on previous film 11/24/16. There is also a chronically elevated right diaphragmatic leaflet.    PMHX:   Past Medical History:  Diagnosis Date  . Alcohol abuse 2013  . Alcoholic cirrhosis ([place]) 12/3242  . Anemia 02/2016   in setting of GI blood loss, but MCV macrocytic.   . Coagulopathy ([place]) 05/2015  . Diverticulosis 09/2015  . Esophageal varices ([place]) 09/2015   small  . GERD (gastroesophageal reflux disease)   . Hyperlipidemia   . Hypertension   . Pneumonia   . Portal hypertension ([place]) 09/2015   with portal gastropathy  . Thrombocytopenia ([place]) 02/2016  . Tubular adenoma of colon 09/2015   Surgical Hx:  Past Surgical History:  Procedure Laterality Date  . ESOPHAGOGASTRODUODENOSCOPY N/A 03/05/2016   Procedure: ESOPHAGOGASTRODUODENOSCOPY (EGD);  Surgeon: Ladene Artist, MD;  Location: [name];  Service: Endoscopy;  Laterality: N/A;  . ESOPHAGOGASTRODUODENOSCOPY N/A 04/29/2017   Procedure: ESOPHAGOGASTRODUODENOSCOPY (EGD);  Surgeon: Milus Banister, MD;  Location: [name];  Service: Endoscopy;  Laterality: N/A;  . ESOPHAGOGASTRODUODENOSCOPY (EGD) WITH PROPOFOL N/A 03/29/2017   Procedure: ESOPHAGOGASTRODUODENOSCOPY (EGD) WITH PROPOFOL;  Surgeon: Doran Stabler, MD;  Location: [facility] END[facility]COPY;  Service: Endoscopy;  Laterality: N/A;  . IR PARACENTESIS  05/15/2017  . testicle prosthesis     Family Hx:  Family History  Problem Relation Age of Onset  . Diabetes Father   . Parkinson's disease Father   . Diabetes Paternal Aunt   . Glaucoma Mother    Social Hx:   Social History  Substance Use Topics  . Smoking status: Never Smoker  . Smokeless tobacco: Never Used  . Alcohol use No     Comment: Quit 2/17, relapse in 1/18 for short period of time   Medication:    Current Outpatient Prescriptions:  .  bismuth subsalicylate (PEPTO BISMOL) 262 MG chewable tablet, Chew 262-524 mg by mouth 3 (three) times daily as needed for  indigestion or diarrhea or loose stools. , Disp: , Rfl:  .  calcium carbonate (TUMS EX) 750 MG chewable tablet, Chew 1-2 tablets by mouth 3 (three) times daily as needed for heartburn. , Disp: , Rfl:  .  ciprofloxacin (CIPRO) 500 MG tablet, Take 500 mg by mouth at bedtime., Disp: , Rfl:  .  ferrous sulfate 325 (65 FE) MG tablet, Take 1 tablet (325 mg total) by mouth 3 (three) times daily with meals., Disp: 90 tablet, Rfl: 0 .  folic acid (FOLVITE) 1 MG tablet, Take 1 tablet (1 mg total) by mouth at bedtime., Disp: 30 tablet, Rfl: 0 .  furosemide (LASIX) 40 MG tablet, Take 1.5 tablets (60 mg total) by mouth daily., Disp: , Rfl:  .  lactulose (CHRONULAC) 10 GM/15ML solution, Take 30 mLs (20 g total) by mouth 2 (two) times daily., Disp: 1800 mL, Rfl: 1 .  pantoprazole (PROTONIX) 40 MG tablet, TAKE 1 TABLET (40 MG TOTAL) BY MOUTH 2 (TWO) TIMES DAILY., Disp: 60 tablet, Rfl: 0 .  potassium chloride (K-DUR,KLOR-CON) 10 MEQ tablet, Take 1 tablet (10 mEq total) by mouth daily., Disp: 30 tablet, Rfl: 0 .  spironolactone (ALDACTONE) 100 MG tablet, Take 1 tablet (100 mg total) by mouth daily., Disp: , Rfl:    Allergies:  Rifaximin  Review of Systems: Gen:  Denies  fever, sweats, chills HEENT: Denies blurred vision, double vision. bleeds, sore throat Cvc:  No dizziness, chest pain. Resp:   Denies cough or sputum production,  Gi: Denies swallowing difficulty, stomach pain. Gu:  Denies bladder incontinence, burning urine Ext:   No Joint pain, stiffness. Skin: No skin rash,  hives  Endoc:  No polyuria, polydipsia. Psych: No depression, insomnia. Other:  All other systems were reviewed with the patient and were negative other that what is mentioned in the HPI.   Physical Examination:   VS: BP 132/74 (BP Location: Left Arm, Cuff Size: Normal)   Pulse (!) 113   Resp 16   Ht 5\' 5"  (1.651 m)   Wt 187 lb (84.8 kg)   SpO2 99%   BMI 31.12 kg/m   General Appearance: No distress . Appears  jaundiced. Neuro:without focal findings,  speech normal,  HEENT: PERRLA, EOM intact.   [name]: normal breath sounds, No wheezing. Decreased air entry, right lung. CardiovascularNormal S1,S2.  No m/r/g.   Abdomen: Benign, distended. Renal:  No costovertebral tenderness  GU:  No performed at this time. Endoc: No evident thyromegaly, no signs of acromegaly. Skin:   warm, no rashes, no ecchymosis  Extremities: normal, no cyanosis, 3+ bilateral lower extremity edema.  Other findings:    LABORATORY PANEL:   CBC No results for input(s): WBC, HGB, HCT, PLT in the last 168 hours. ------------------------------------------------------------------------------------------------------------------  Chemistries  No results for input(s): NA, K, CL, CO2, GLUCOSE, BUN, CREATININE, CALCIUM, MG, AST, ALT, ALKPHOS, BILITOT in the last 168 hours.  Invalid input(s): GFRCGP ------------------------------------------------------------------------------------------------------------------  Cardiac Enzymes No results for input(s): TROPONINI in the last 168 hours. ------------------------------------------------------------  RADIOLOGY:  No results found.     Thank  you for the consultation and  for allowing [name] to assist in the care of your patient. Our recommendations are noted above.  Please contact us if we can be of further service.   Marda Stalker, MD.  Board Certified in Internal Medicine, [name], [place], and Sleep Medicine.  [name] Office Number: [phone]  Patricia Pesa, M.D.  Merton Border, M.D  06/05/2017

## 2017-06-06 ENCOUNTER — Ambulatory Visit (INDEPENDENT_AMBULATORY_CARE_PROVIDER_SITE_OTHER): Payer: BLUE CROSS/BLUE SHIELD | Admitting: Internal Medicine

## 2017-06-06 ENCOUNTER — Encounter: Payer: Self-pay | Admitting: Internal Medicine

## 2017-06-06 VITALS — BP 132/74 | HR 113 | Resp 16 | Ht 65.0 in | Wt 187.0 lb

## 2017-06-06 DIAGNOSIS — J90 Pleural effusion, not elsewhere classified: Secondary | ICD-10-CM | POA: Diagnosis not present

## 2017-06-06 DIAGNOSIS — K7031 Alcoholic cirrhosis of liver with ascites: Secondary | ICD-10-CM | POA: Diagnosis not present

## 2017-06-06 NOTE — Addendum Note (Signed)
Addended by: Devona Konig on: 06/06/2017 11:57 AM   Modules accepted: Orders

## 2017-06-06 NOTE — Patient Instructions (Addendum)
--  Will send for US guided paracentesis, if this is not possible will send for right thoracentesis.    --If you continue to have dyspnea after the paracentesis, please call us back as we will need to repeat a chest x-ray to see if you still have fluid around your lung. If this is still present, you will need a drainage of this fluid.

## 2017-06-11 ENCOUNTER — Other Ambulatory Visit: Payer: Self-pay

## 2017-06-11 ENCOUNTER — Ambulatory Visit (HOSPITAL_COMMUNITY)
Admission: RE | Admit: 2017-06-11 | Discharge: 2017-06-11 | Disposition: A | Discharge status: Home / Self Care | Payer: BLUE CROSS/BLUE SHIELD | Source: Ambulatory Visit | Attending: Internal Medicine | Admitting: Internal Medicine

## 2017-06-11 ENCOUNTER — Encounter (HOSPITAL_COMMUNITY): Payer: Self-pay | Admitting: Student

## 2017-06-11 DIAGNOSIS — R188 Other ascites: Secondary | ICD-10-CM | POA: Insufficient documentation

## 2017-06-11 DIAGNOSIS — K7031 Alcoholic cirrhosis of liver with ascites: Secondary | ICD-10-CM

## 2017-06-11 HISTORY — PX: IR PARACENTESIS: IMG2679

## 2017-06-11 MED ORDER — ALBUMIN HUMAN 25 % IV SOLN
25.0000 g | Freq: Once | INTRAVENOUS | Status: AC
  Administered 2017-06-11: 25 g via INTRAVENOUS
  Filled 2017-06-11: qty 100

## 2017-06-11 MED ORDER — LIDOCAINE HCL (PF) 1 % IJ SOLN
INTRAMUSCULAR | Status: AC
  Filled 2017-06-11: qty 30

## 2017-06-11 MED ORDER — LIDOCAINE HCL 1 % IJ SOLN
INTRAMUSCULAR | Status: DC | PRN
  Administered 2017-06-11: 10 mL

## 2017-06-11 NOTE — Procedures (Signed)
PROCEDURE SUMMARY:  Successful US guided paracentesis from left lateral abdomen.  Yielded 3.5 liters of yellow fluid.  No immediate complications.  Pt tolerated well.   Specimen was not sent for labs.  Docia Barrier PA-C 06/11/2017 2:46 PM

## 2017-06-13 ENCOUNTER — Emergency Department (HOSPITAL_COMMUNITY): Payer: BLUE CROSS/BLUE SHIELD

## 2017-06-13 ENCOUNTER — Inpatient Hospital Stay (HOSPITAL_COMMUNITY)
Admission: EM | Admit: 2017-06-13 | Discharge: 2017-06-16 | DRG: 812 | Disposition: A | Discharge status: Home / Self Care | Payer: BLUE CROSS/BLUE SHIELD | Attending: Internal Medicine | Admitting: Internal Medicine

## 2017-06-13 ENCOUNTER — Encounter (HOSPITAL_COMMUNITY): Payer: Self-pay | Admitting: Emergency Medicine

## 2017-06-13 DIAGNOSIS — K922 Gastrointestinal hemorrhage, unspecified: Secondary | ICD-10-CM | POA: Diagnosis not present

## 2017-06-13 DIAGNOSIS — E876 Hypokalemia: Secondary | ICD-10-CM | POA: Diagnosis not present

## 2017-06-13 DIAGNOSIS — F1010 Alcohol abuse, uncomplicated: Secondary | ICD-10-CM | POA: Diagnosis present

## 2017-06-13 DIAGNOSIS — D649 Anemia, unspecified: Secondary | ICD-10-CM | POA: Diagnosis not present

## 2017-06-13 DIAGNOSIS — D539 Nutritional anemia, unspecified: Secondary | ICD-10-CM

## 2017-06-13 DIAGNOSIS — E785 Hyperlipidemia, unspecified: Secondary | ICD-10-CM | POA: Diagnosis present

## 2017-06-13 DIAGNOSIS — I8500 Esophageal varices without bleeding: Secondary | ICD-10-CM | POA: Diagnosis not present

## 2017-06-13 DIAGNOSIS — R0602 Shortness of breath: Secondary | ICD-10-CM | POA: Diagnosis not present

## 2017-06-13 DIAGNOSIS — J90 Pleural effusion, not elsewhere classified: Secondary | ICD-10-CM | POA: Diagnosis not present

## 2017-06-13 DIAGNOSIS — I10 Essential (primary) hypertension: Secondary | ICD-10-CM | POA: Diagnosis present

## 2017-06-13 DIAGNOSIS — K769 Liver disease, unspecified: Secondary | ICD-10-CM | POA: Diagnosis not present

## 2017-06-13 DIAGNOSIS — J918 Pleural effusion in other conditions classified elsewhere: Secondary | ICD-10-CM | POA: Diagnosis not present

## 2017-06-13 DIAGNOSIS — R846 Abnormal cytological findings in specimens from respiratory organs and thorax: Secondary | ICD-10-CM | POA: Diagnosis not present

## 2017-06-13 DIAGNOSIS — I8510 Secondary esophageal varices without bleeding: Secondary | ICD-10-CM | POA: Diagnosis not present

## 2017-06-13 DIAGNOSIS — K219 Gastro-esophageal reflux disease without esophagitis: Secondary | ICD-10-CM | POA: Diagnosis not present

## 2017-06-13 DIAGNOSIS — D62 Acute posthemorrhagic anemia: Secondary | ICD-10-CM | POA: Diagnosis not present

## 2017-06-13 DIAGNOSIS — D500 Iron deficiency anemia secondary to blood loss (chronic): Secondary | ICD-10-CM | POA: Diagnosis present

## 2017-06-13 DIAGNOSIS — K7031 Alcoholic cirrhosis of liver with ascites: Secondary | ICD-10-CM | POA: Diagnosis not present

## 2017-06-13 DIAGNOSIS — K921 Melena: Secondary | ICD-10-CM

## 2017-06-13 DIAGNOSIS — D6959 Other secondary thrombocytopenia: Secondary | ICD-10-CM | POA: Diagnosis present

## 2017-06-13 DIAGNOSIS — D689 Coagulation defect, unspecified: Secondary | ICD-10-CM | POA: Diagnosis not present

## 2017-06-13 DIAGNOSIS — D696 Thrombocytopenia, unspecified: Secondary | ICD-10-CM | POA: Diagnosis not present

## 2017-06-13 DIAGNOSIS — N179 Acute kidney failure, unspecified: Secondary | ICD-10-CM | POA: Diagnosis present

## 2017-06-13 DIAGNOSIS — K7210 Chronic hepatic failure without coma: Secondary | ICD-10-CM | POA: Diagnosis present

## 2017-06-13 DIAGNOSIS — K766 Portal hypertension: Secondary | ICD-10-CM | POA: Diagnosis present

## 2017-06-13 DIAGNOSIS — Z9889 Other specified postprocedural states: Secondary | ICD-10-CM

## 2017-06-13 DIAGNOSIS — Z881 Allergy status to other antibiotic agents status: Secondary | ICD-10-CM | POA: Diagnosis not present

## 2017-06-13 DIAGNOSIS — R05 Cough: Secondary | ICD-10-CM | POA: Diagnosis not present

## 2017-06-13 DIAGNOSIS — Z66 Do not resuscitate: Secondary | ICD-10-CM | POA: Diagnosis present

## 2017-06-13 DIAGNOSIS — I459 Conduction disorder, unspecified: Secondary | ICD-10-CM | POA: Diagnosis present

## 2017-06-13 DIAGNOSIS — Z48813 Encounter for surgical aftercare following surgery on the respiratory system: Secondary | ICD-10-CM | POA: Diagnosis not present

## 2017-06-13 DIAGNOSIS — K7290 Hepatic failure, unspecified without coma: Secondary | ICD-10-CM | POA: Diagnosis present

## 2017-06-13 LAB — CBC WITH DIFFERENTIAL/PLATELET
BASOS ABS: 0 10*3/uL (ref 0.0–0.1)
BASOS PCT: 0 %
EOS ABS: 0.1 10*3/uL (ref 0.0–0.7)
Eosinophils Relative: 1 %
HEMATOCRIT: 17.2 % — AB (ref 39.0–52.0)
HEMOGLOBIN: 5.7 g/dL — AB (ref 13.0–17.0)
LYMPHS PCT: 15 %
Lymphs Abs: 1.5 10*3/uL (ref 0.7–4.0)
MCH: 40.7 pg — AB (ref 26.0–34.0)
MCHC: 33.1 g/dL (ref 30.0–36.0)
MCV: 122.9 fL — ABNORMAL HIGH (ref 78.0–100.0)
MONOS PCT: 25 %
Monocytes Absolute: 2.4 10*3/uL — ABNORMAL HIGH (ref 0.1–1.0)
NEUTROS ABS: 5.7 10*3/uL (ref 1.7–7.7)
NEUTROS PCT: 59 %
Platelets: 119 10*3/uL — ABNORMAL LOW (ref 150–400)
RBC: 1.4 MIL/uL — ABNORMAL LOW (ref 4.22–5.81)
RDW: 19.2 % — ABNORMAL HIGH (ref 11.5–15.5)
WBC: 9.7 10*3/uL (ref 4.0–10.5)

## 2017-06-13 LAB — COMPREHENSIVE METABOLIC PANEL
ALBUMIN: 2.4 g/dL — AB (ref 3.5–5.0)
ALK PHOS: 106 U/L (ref 38–126)
ALT: 26 U/L (ref 17–63)
ANION GAP: 11 (ref 5–15)
AST: 56 U/L — ABNORMAL HIGH (ref 15–41)
BILIRUBIN TOTAL: 8.7 mg/dL — AB (ref 0.3–1.2)
BUN: 22 mg/dL — ABNORMAL HIGH (ref 6–20)
CALCIUM: 8.5 mg/dL — AB (ref 8.9–10.3)
CO2: 29 mmol/L (ref 22–32)
CREATININE: 1.76 mg/dL — AB (ref 0.61–1.24)
Chloride: 95 mmol/L — ABNORMAL LOW (ref 101–111)
GFR calc Af Amer: 48 mL/min — ABNORMAL LOW (ref >60)
GFR calc non Af Amer: 42 mL/min — ABNORMAL LOW (ref >60)
GLUCOSE: 144 mg/dL — AB (ref 65–99)
Potassium: 3 mmol/L — ABNORMAL LOW (ref 3.5–5.1)
SODIUM: 135 mmol/L (ref 135–145)
TOTAL PROTEIN: 5.8 g/dL — AB (ref 6.5–8.1)

## 2017-06-13 LAB — POC OCCULT BLOOD, ED: Fecal Occult Bld: POSITIVE — AB

## 2017-06-13 LAB — PREPARE RBC (CROSSMATCH)

## 2017-06-13 LAB — PROTIME-INR
INR: 2.63
Prothrombin Time: 28.6 seconds — ABNORMAL HIGH (ref 11.4–15.2)

## 2017-06-13 LAB — BRAIN NATRIURETIC PEPTIDE: B Natriuretic Peptide: 79.3 pg/mL (ref 0.0–100.0)

## 2017-06-13 LAB — MAGNESIUM: MAGNESIUM: 1.5 mg/dL — AB (ref 1.7–2.4)

## 2017-06-13 MED ORDER — MAGNESIUM SULFATE 2 GM/50ML IV SOLN
2.0000 g | Freq: Once | INTRAVENOUS | Status: AC
  Administered 2017-06-13: 2 g via INTRAVENOUS
  Filled 2017-06-13: qty 50

## 2017-06-13 MED ORDER — SODIUM CHLORIDE 0.9 % IV SOLN
80.0000 mg | Freq: Once | INTRAVENOUS | Status: AC
  Administered 2017-06-13: 80 mg via INTRAVENOUS
  Filled 2017-06-13: qty 80

## 2017-06-13 MED ORDER — SODIUM CHLORIDE 0.9 % IV SOLN
Freq: Once | INTRAVENOUS | Status: AC
  Administered 2017-06-13: 22:00:00 via INTRAVENOUS

## 2017-06-13 MED ORDER — OXYCODONE HCL 5 MG PO TABS: 5.0000 mg | ORAL_TABLET | ORAL | Status: DC | PRN

## 2017-06-13 MED ORDER — PHYTONADIONE 5 MG PO TABS
5.0000 mg | ORAL_TABLET | Freq: Once | ORAL | Status: AC
  Administered 2017-06-13: 5 mg via ORAL
  Filled 2017-06-13: qty 1

## 2017-06-13 MED ORDER — ONDANSETRON HCL 4 MG PO TABS: 4.0000 mg | ORAL_TABLET | Freq: Four times a day (QID) | ORAL | Status: DC | PRN

## 2017-06-13 MED ORDER — LACTULOSE 10 GM/15ML PO SOLN
20.0000 g | Freq: Two times a day (BID) | ORAL | Status: DC
  Administered 2017-06-14 – 2017-06-16 (×4): 20 g via ORAL
  Filled 2017-06-13 (×4): qty 30

## 2017-06-13 MED ORDER — PANTOPRAZOLE SODIUM 40 MG IV SOLR: 40.0000 mg | Freq: Two times a day (BID) | INTRAVENOUS | Status: DC

## 2017-06-13 MED ORDER — INSULIN ASPART 100 UNIT/ML ~~LOC~~ SOLN: 0.0000 [IU] | SUBCUTANEOUS | Status: DC

## 2017-06-13 MED ORDER — SODIUM CHLORIDE 0.9% FLUSH
3.0000 mL | Freq: Two times a day (BID) | INTRAVENOUS | Status: DC
  Administered 2017-06-13 – 2017-06-16 (×6): 3 mL via INTRAVENOUS

## 2017-06-13 MED ORDER — CIPROFLOXACIN HCL 500 MG PO TABS: 500.0000 mg | ORAL_TABLET | Freq: Every day | ORAL | Status: DC

## 2017-06-13 MED ORDER — POTASSIUM CHLORIDE CRYS ER 20 MEQ PO TBCR
40.0000 meq | EXTENDED_RELEASE_TABLET | Freq: Once | ORAL | Status: AC
  Administered 2017-06-13: 40 meq via ORAL
  Filled 2017-06-13: qty 2

## 2017-06-13 MED ORDER — ONDANSETRON HCL 4 MG/2ML IJ SOLN: 4.0000 mg | Freq: Four times a day (QID) | INTRAMUSCULAR | Status: DC | PRN

## 2017-06-13 MED ORDER — FOLIC ACID 1 MG PO TABS
1.0000 mg | ORAL_TABLET | Freq: Every day | ORAL | Status: DC
  Administered 2017-06-14 – 2017-06-15 (×2): 1 mg via ORAL
  Filled 2017-06-13 (×2): qty 1

## 2017-06-13 MED ORDER — FUROSEMIDE 10 MG/ML IJ SOLN
20.0000 mg | Freq: Once | INTRAMUSCULAR | Status: AC
  Administered 2017-06-13: 20 mg via INTRAVENOUS
  Filled 2017-06-13: qty 4

## 2017-06-13 MED ORDER — SODIUM CHLORIDE 0.9 % IV SOLN
8.0000 mg/h | INTRAVENOUS | Status: DC
  Administered 2017-06-13 – 2017-06-16 (×7): 8 mg/h via INTRAVENOUS
  Filled 2017-06-13 (×10): qty 80

## 2017-06-13 NOTE — H&P (Signed)
History and Physical    Thomas [name] VZD:[id number] DOB: 15-Apr-1961 DOA: 06/13/2017  PCP: Alroy Dust, L.Marlou Sa, MD   Patient coming from: Home  Chief Complaint: Generalized weakness, SOB   HPI: Thomas [name] is a 56 y.o. male with medical history significant for alcoholic liver cirrhosis with resistant ascites, esophageal varices, history of SBP, chronic macrocytic anemia, and chronic thrombocytopenia, now presenting to the emergency department for evaluation of generalized weakness and shortness of breath. The patient is accompanied by his wife who assists with the history. He had reportedly been in his usual state until several days ago when they noted the insidious development of generalized weakness and shortness of breath. These symptoms have continued to progress over the ensuing days and the patient is essentially bedbound at this point. He denies any fevers or chills, denies chest pain or palpitations, and denies any significant cough. Patient reports black tarry stools, but had been attributing this to his iron supplementation. He has also been seen some maroon blood in his bowel movements intermittently over the past couple days, but he was attributing this to his hemorrhoids. He reports nausea and vomiting, but only after some of his lactulose doses, and with hematemesis denied. He underwent a therapeutic paracentesis on 06/11/2017 with removal of 3.5 L. He hoped that this would improve his shortness of breath, but it has continued to worsen since then.  ED Course: Upon arrival to the ED, patient is found to be afebrile, saturating 90% on room air, slightly tachycardic, and with vitals otherwise stable. EKG features a sinus rhythm with nonspecific interventricular conduction delay. Chest x-ray is notable for increased right pleural effusion, now large, occupying the lower two-thirds of the hemithorax. Chemistry panels notable for a potassium of 3.0, magnesium 1.5, total bilirubin of 8.7, and serum  creatinine of 1.76, up from a recent value of 1.2. CBC is notable for macrocytic anemia with hemoglobin of 5.7, down from priors in the 8 range. There is a stable thrombocytopenia with platelets 119,000. INR is elevated to 2.63, similar to priors. Fecal occult blood testing is positive and stool was noted to be black and sticky. Patient was given 40 mEq oral potassium in the emergency department and 2 units of packed red blood cells were ordered for immediate transfusion. The patient remained hemodynamically stable and has not been in acute respiratory distress. He will be admitted to the stepdown unit for ongoing evaluation and management of generalized weakness and shortness of breath, suspected secondary to acute on chronic anemia from likely GI bleeding, and also an enlarging right pleural effusion that is contributing to his dyspnea.   Review of Systems:  All other systems reviewed and apart from HPI, are negative.  Past Medical History:  Diagnosis Date  . Alcohol abuse 2013  . Alcoholic cirrhosis ([place]) 01/9517  . Anemia 02/2016   in setting of GI blood loss, but MCV macrocytic.   . Coagulopathy ([place]) 05/2015  . Diverticulosis 09/2015  . Esophageal varices ([place]) 09/2015   small  . GERD (gastroesophageal reflux disease)   . Hyperlipidemia   . Hypertension   . Pneumonia   . Portal hypertension ([place]) 09/2015   with portal gastropathy  . Thrombocytopenia ([place]) 02/2016  . Tubular adenoma of colon 09/2015    Past Surgical History:  Procedure Laterality Date  . ESOPHAGOGASTRODUODENOSCOPY N/A 03/05/2016   Procedure: ESOPHAGOGASTRODUODENOSCOPY (EGD);  Surgeon: Ladene Artist, MD;  Location: [name];  Service: Endoscopy;  Laterality: N/A;  . ESOPHAGOGASTRODUODENOSCOPY N/A 04/29/2017   Procedure:  ESOPHAGOGASTRODUODENOSCOPY (EGD);  Surgeon: Milus Banister, MD;  Location: [name];  Service: Endoscopy;  Laterality: N/A;  . ESOPHAGOGASTRODUODENOSCOPY (EGD) WITH PROPOFOL N/A 03/29/2017    Procedure: ESOPHAGOGASTRODUODENOSCOPY (EGD) WITH PROPOFOL;  Surgeon: Doran Stabler, MD;  Location: [facility];  Service: Endoscopy;  Laterality: N/A;  . IR PARACENTESIS  05/15/2017  . IR PARACENTESIS  06/11/2017  . testicle prosthesis       reports that he has never smoked. He has never used smokeless tobacco. He reports that he does not drink alcohol or use drugs.  Allergies  Allergen Reactions  . Rifaximin Swelling and Other (See Comments)    Reaction:  All over body swelling     Family History  Problem Relation Age of Onset  . Diabetes Father   . Parkinson's disease Father   . Diabetes Paternal Aunt   . Glaucoma Mother      Prior to Admission medications   Medication Sig Start Date End Date Taking? Authorizing Provider  aspirin-sod bicarb-citric acid (ALKA-SELTZER) 325 MG TBEF tablet Take 325 mg by mouth every 6 (six) hours as needed.   Yes [provider]  ciprofloxacin (CIPRO) 500 MG tablet Take 500 mg by mouth every evening.   Yes [provider]  ferrous sulfate 325 (65 FE) MG tablet Take 1 tablet (325 mg total) by mouth 3 (three) times daily with meals. Patient taking differently: Take 325 mg by mouth every evening.  04/30/17  Yes Patrecia Pour, Christean Grief, MD  folic acid (FOLVITE) 1 MG tablet Take 1 tablet (1 mg total) by mouth at bedtime. 04/30/17  Yes Patrecia Pour, Christean Grief, MD  furosemide (LASIX) 40 MG tablet Take 1.5 tablets (60 mg total) by mouth daily. 04/30/17  Yes Patrecia Pour, Christean Grief, MD  lactulose (CHRONULAC) 10 GM/15ML solution Take 30 mLs (20 g total) by mouth 2 (two) times daily. 05/14/17  Yes Pyrtle, Lajuan Lines, MD  pantoprazole (PROTONIX) 40 MG tablet TAKE 1 TABLET (40 MG TOTAL) BY MOUTH 2 (TWO) TIMES DAILY. 03/10/16  Yes Pyrtle, Lajuan Lines, MD  potassium chloride (K-DUR,KLOR-CON) 10 MEQ tablet Take 1 tablet (10 mEq total) by mouth daily. 05/01/17  Yes Patrecia Pour, Christean Grief, MD  spironolactone (ALDACTONE) 100 MG tablet Take 1 tablet (100 mg total) by mouth daily.  04/30/17  Yes Patrecia Pour, Christean Grief, MD    Physical Exam: Vitals:   06/13/17 1632 06/13/17 1640 06/13/17 2017  BP: 114/62 135/65 125/64  Pulse: (!) 104 (!) 107 97  Resp: 16 16 12   Temp: 98.2 F (36.8 C) 98 F (36.7 C) 98.1 F (36.7 C)  TempSrc: Oral Oral Oral  SpO2: 96% 90% 95%      Constitutional: No acute distress, lethargic, jaundiced.  Eyes: PERTLA, lids and conjunctivae normal ENMT: Mucous membranes are moist. Posterior pharynx clear of any exudate or lesions.   Neck: normal, supple, no masses, no thyromegaly Respiratory: Breath sounds markedly diminished over right lung fields. Normal respiratory effort. No accessory muscle use.  Cardiovascular: Rate ~110 and regular. 2+ pretibial edema bilaterally. No significant JVD. Abdomen: Mild distension, soft, mild generalized tenderness, no rebound pain or guarding. Bowel sounds active.  Musculoskeletal: no clubbing / cyanosis. No joint deformity upper and lower extremities.   Skin: no significant rashes, lesions, ulcers. Warm, dry, well-perfused. Jaundice.  Neurologic: CN 2-12 grossly intact. Sensation intact, DTR normal. Strength 5/5 in all 4 limbs.  Psychiatric: Lethargic, easily roused, oriented x 3. Calm, cooperative.     Labs on Admission: I have personally reviewed following  labs and imaging studies  CBC:  Recent Labs Lab 06/13/17 1856  WBC 9.7  NEUTROABS 5.7  HGB 5.7*  HCT 17.2*  MCV 122.9*  PLT 222*   Basic Metabolic Panel:  Recent Labs Lab 06/13/17 1856  NA 135  K 3.0*  CL 95*  CO2 29  GLUCOSE 144*  BUN 22*  CREATININE 1.76*  CALCIUM 8.5*   GFR: Estimated Creatinine Clearance: 47.5 mL/min (A) (by C-G formula based on SCr of 1.76 mg/dL (H)). Liver Function Tests:  Recent Labs Lab 06/13/17 1856  AST 56*  ALT 26  ALKPHOS 106  BILITOT 8.7*  PROT 5.8*  ALBUMIN 2.4*   No results for input(s): LIPASE, AMYLASE in the last 168 hours. No results for input(s): AMMONIA in the last 168  hours. Coagulation Profile:  Recent Labs Lab 06/13/17 1856  INR 2.63   Cardiac Enzymes: No results for input(s): CKTOTAL, CKMB, CKMBINDEX, TROPONINI in the last 168 hours. BNP (last 3 results) No results for input(s): PROBNP in the last 8760 hours. HbA1C: No results for input(s): HGBA1C in the last 72 hours. CBG: No results for input(s): GLUCAP in the last 168 hours. Lipid Profile: No results for input(s): CHOL, HDL, LDLCALC, TRIG, CHOLHDL, LDLDIRECT in the last 72 hours. Thyroid Function Tests: No results for input(s): TSH, T4TOTAL, FREET4, T3FREE, THYROIDAB in the last 72 hours. Anemia Panel: No results for input(s): VITAMINB12, FOLATE, FERRITIN, TIBC, IRON, RETICCTPCT in the last 72 hours. Urine analysis:    Component Value Date/Time   COLORURINE AMBER (A) 04/30/2017 0759   APPEARANCEUR CLEAR 04/30/2017 0759   LABSPEC 1.023 04/30/2017 0759   PHURINE 6.0 04/30/2017 0759   GLUCOSEU NEGATIVE 04/30/2017 0759   HGBUR NEGATIVE 04/30/2017 0759   BILIRUBINUR NEGATIVE 04/30/2017 0759   KETONESUR NEGATIVE 04/30/2017 0759   PROTEINUR NEGATIVE 04/30/2017 0759   UROBILINOGEN 0.2 11/22/2012 0856   NITRITE NEGATIVE 04/30/2017 0759   LEUKOCYTESUR NEGATIVE 04/30/2017 0759   Sepsis Labs: @LABRCNTIP (procalcitonin:4,lacticidven:4) )No results found for this or any previous visit (from the past 240 hour(s)).   Radiological Exams on Admission: Dg Chest 2 View  Result Date: 06/13/2017 CLINICAL DATA:  Cough and shortness of breath for 1 month. Weakness. EXAM: CHEST  2 VIEW COMPARISON:  Most recent comparison radiograph 06/01/2017. Chest CT 11/24/2016 FINDINGS: Progressive large right pleural effusion now occupying the lower 2/3 of the right hemithorax. Associated atelectasis without leftward mediastinal shift. Unchanged mediastinal contours, right heart borders obscured by pleural fluid. No pulmonary edema. No pneumothorax. Stable osseous structures. IMPRESSION: Increased right pleural  effusion, now large occupying the lower 2/3 of the right hemithorax. Likely associated atelectasis given lack of leftward mediastinal shift. Electronically Signed   By: Jeb Levering M.D.   On: 06/13/2017 18:31    EKG: Independently reviewed. Sinus rhythm, non-specific IVCD.   Assessment/Plan  1. Symptomatic anemia, melena   - Pt presents with SOB, fatigue, black stools  - Found to have Hgb 5.7, down from priors in 8-range, with DRE revealing black sticky stool with FOBT+  - Has known varices, but no nausea, vomiting, or hematemesis  - Discussed with GI, their involvement much appreciated  - Plan to transfuse 2 units pRBC's, start IV Protonix, give vit K for associated coagulopathy, check post-transfusion CBC   2. Right pleural effusion, large  - Pt has known right pleural effusion followed by pulmonology - Admission CXR demonstrates enlargement, now occupying lower 2/3 of the hemithorax - There is no acute respiratory distress  - Likely secondary to his  decompensated cirrhosis  - Ideally, would like to request a therapeutic thoracentesis, but with INR 2.6, likely too risky   - He is given vit K as above, will repeat INR in am, still likely to be quite elevated; FFP could be considered in order to facility thora   3. Decompensated alcoholic cirrhosis  - Following at [facility] transplant center  - Has diuretic-resistant ascites; had therapeutic paracentesis on 06/11/17 with 3.5 L off  - MELD score 32 on admission, corresponding to estimated 53% 75-month mortality  - Has known varices, not on beta-blocker; hx of SBP on prophylactic Cipro - Aldactone and Lasix held given the AKI, but will plan for dose of IV Lasix after the 2 units of RBC's  - Monitor and replete lytes, continue PPI, continue lactulose   4. Acute kidney injury  - SCr is 1.74 on admission, up from apparent baseline of 1.2  - Likely prerenal in setting of suspected GIB  - Plan to hold his diuretics, transfuse 2 units pRBC's,  repeat chem panel in am    5. Hypokalemia, hypomagnesemia  - Serum potassium is 3.0 on admission with magnesium 1.5  - He was treated with 40 mEq oral potassium in ED and 2 g IV magnesium on admission  - Monitor on telemetry and repeat chemistry panel in am   6. 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

## 2017-06-13 NOTE — ED Notes (Addendum)
Lab called critical result for hemoglobin Hemoglobin 5.7 PA made aware

## 2017-06-13 NOTE — ED Notes (Signed)
Blood consent signed at this time by patients spouse. Patient gave verbal consent for spouse to sign for him.

## 2017-06-13 NOTE — ED Triage Notes (Addendum)
Pt reports he had a paracentesis 2 days ago. Pt reports this causes trouble breathing due to pressure on thoracic area and had known fluid on his R lung.  Also reports generalized weakness.

## 2017-06-13 NOTE — ED Provider Notes (Signed)
[name] DEPT Provider Note   CSN: [id number] Arrival date & time: 06/13/17  1623     History   Chief Complaint Chief Complaint  Patient presents with  . abd swelling    HPI Ersel Enslin is a 56 y.o. male with past medical history significant for alcohol liver cirrhosis, end-stage liver disease, esophageal varices, portal hypertension with portal gastropathy, hx of SBP, hypertension, hyperlipidemia, GERD who presents today for ongoing shortness of breath after paracentesis 2 days ago. The patient was first noticed to have a right-sided pleural effusion on 11/24/2016. On 04/27/2017 he was diagnosed with acute metabolic encephalopathy, healthcare associated PNA, hypokalemia, GI bleed, GI bleed and admitted to the hospital at [facility]. The patient states he has been having ongoing shortness of breath with a dry cough, bloating, weakness, decreased appetite, bilateral leg swellig since being discharge from [facility] on 04/30/17. He was seen on 06/06/2017 by pulmonologist for pleural effusion and ongoing shortness of breath. Was sent for an ultrasound-guided paracentesis and told that if his breathing is still short after paracentesis to call for a repeat chest x-ray and if there is continued pleural effusion he'll be referred for a thoracentesis. The patient had a successful paracentesis 2 days ago where 3.5 L of fluid was removed from the abdomen for recurrent ascites.  He states that he becomes short of breath when sitting up or walking. He also has been having orthopnea with a 1 pillow increase. The patient is on lactulose so endorses diarrhea, that is melanous in color. The patient has been without potassium, ciprofloxacin (for SBP prevention) and iron for 1 week due to pharmacy issues. The patient denies fever, chills, visual changes, chest pain, abdominal pain, nausea, emesis, hemoptysis, hematochezia, hematuria, urinary frequency, rash.   HPI  Past Medical History:  Diagnosis Date  . Alcohol abuse  2013  . Alcoholic cirrhosis ([place]) 04/7845  . Anemia 02/2016   in setting of GI blood loss, but MCV macrocytic.   . Coagulopathy ([place]) 05/2015  . Diverticulosis 09/2015  . Esophageal varices ([place]) 09/2015   small  . GERD (gastroesophageal reflux disease)   . Hyperlipidemia   . Hypertension   . Pneumonia   . Portal hypertension ([place]) 09/2015   with portal gastropathy  . Thrombocytopenia ([place]) 02/2016  . Tubular adenoma of colon 09/2015    Patient Active Problem List   Diagnosis Date Noted  . Encephalopathy acute 04/27/2017  . HCAP (healthcare-associated pneumonia) 04/27/2017  . Acute renal failure (ARF) ([place]) 03/28/2017  . Hyperglycemia 03/28/2017  . Hypoalbuminemia 03/28/2017  . Edema 03/28/2017  . S/P thoracentesis   . Symptomatic anemia   . Dyspnea 03/03/2017  . Hypomagnesemia 11/26/2016  . Acute respiratory failure with hypoxia ([place]) 11/24/2016  . Pleural effusion associated with hepatic disorder 11/24/2016  . Hypokalemia 11/24/2016  . SBP (spontaneous bacterial peritonitis) ([place]) 05/09/2016  . Severe sepsis ([name]) 05/09/2016  . Decompensation of cirrhosis of liver ([place]) 05/07/2016  . Hypotension 05/07/2016  . Ascites due to alcoholic cirrhosis ([place]) 96/29/5284  . Localized edema 04/17/2016  . Alcoholic cirrhosis of liver with ascites ([place])   . Bleeding gastrointestinal   . End stage liver disease ([place])   . Upper GI bleed 03/05/2016  . Coagulopathy ([place]) 03/05/2016  . Acute kidney injury ([place]) 03/05/2016  . Hepatic encephalopathy ([place]) 03/05/2016  . Macrocytic anemia 03/05/2016  . Thrombocytopenia ([place]) 03/05/2016  . Esophageal varices ([place]) 03/05/2016  . Acute blood loss anemia 03/05/2016  . Idiopathic esophageal varices without bleeding ([place])   .  Portal hypertensive gastropathy ([place])   . Encephalopathy, metabolic 95/18/8416    Past Surgical History:  Procedure Laterality Date  . ESOPHAGOGASTRODUODENOSCOPY N/A 03/05/2016   Procedure: ESOPHAGOGASTRODUODENOSCOPY  (EGD);  Surgeon: Ladene Artist, MD;  Location: [name];  Service: Endoscopy;  Laterality: N/A;  . ESOPHAGOGASTRODUODENOSCOPY N/A 04/29/2017   Procedure: ESOPHAGOGASTRODUODENOSCOPY (EGD);  Surgeon: Milus Banister, MD;  Location: [name];  Service: Endoscopy;  Laterality: N/A;  . ESOPHAGOGASTRODUODENOSCOPY (EGD) WITH PROPOFOL N/A 03/29/2017   Procedure: ESOPHAGOGASTRODUODENOSCOPY (EGD) WITH PROPOFOL;  Surgeon: Doran Stabler, MD;  Location: [facility];  Service: Endoscopy;  Laterality: N/A;  . IR PARACENTESIS  05/15/2017  . IR PARACENTESIS  06/11/2017  . testicle prosthesis         Home Medications    Prior to Admission medications   Medication Sig Start Date End Date Taking? Authorizing Provider  aspirin-sod bicarb-citric acid (ALKA-SELTZER) 325 MG TBEF tablet Take 325 mg by mouth every 6 (six) hours as needed.   Yes [provider]  ciprofloxacin (CIPRO) 500 MG tablet Take 500 mg by mouth every evening.   Yes [provider]  ferrous sulfate 325 (65 FE) MG tablet Take 1 tablet (325 mg total) by mouth 3 (three) times daily with meals. Patient taking differently: Take 325 mg by mouth every evening.  04/30/17  Yes Patrecia Pour, Christean Grief, MD  folic acid (FOLVITE) 1 MG tablet Take 1 tablet (1 mg total) by mouth at bedtime. 04/30/17  Yes Patrecia Pour, Christean Grief, MD  furosemide (LASIX) 40 MG tablet Take 1.5 tablets (60 mg total) by mouth daily. 04/30/17  Yes Patrecia Pour, Christean Grief, MD  lactulose (CHRONULAC) 10 GM/15ML solution Take 30 mLs (20 g total) by mouth 2 (two) times daily. 05/14/17  Yes Pyrtle, Lajuan Lines, MD  pantoprazole (PROTONIX) 40 MG tablet TAKE 1 TABLET (40 MG TOTAL) BY MOUTH 2 (TWO) TIMES DAILY. 03/10/16  Yes Pyrtle, Lajuan Lines, MD  potassium chloride (K-DUR,KLOR-CON) 10 MEQ tablet Take 1 tablet (10 mEq total) by mouth daily. 05/01/17  Yes Patrecia Pour, Christean Grief, MD  spironolactone (ALDACTONE) 100 MG tablet Take 1 tablet (100 mg total) by mouth daily. 04/30/17  Yes Doreatha Lew, MD    Family History Family History  Problem Relation Age of Onset  . Diabetes Father   . Parkinson's disease Father   . Diabetes Paternal Aunt   . Glaucoma Mother     Social History Social History  Substance Use Topics  . Smoking status: Never Smoker  . Smokeless tobacco: Never Used  . Alcohol use No     Comment: Quit 2/17, relapse in 1/18 for short period of time     Allergies   Rifaximin   Review of Systems Review of Systems  All other systems reviewed and are negative.    Physical Exam Updated Vital Signs BP 125/64 (BP Location: Left Arm)   Pulse 97   Temp 98.1 F (36.7 C) (Oral)   Resp 12   SpO2 95%   Physical Exam  Constitutional: He appears well-developed and well-nourished.  Non-toxic appearing  HENT:  Head: Normocephalic and atraumatic.  Right Ear: External ear normal.  Left Ear: External ear normal.  Nose: Nose normal.  Mouth/Throat: Oropharynx is clear and moist.  Eyes: Pupils are equal, round, and reactive to light. Right eye exhibits no discharge. Left eye exhibits no discharge. Scleral icterus is present.  Neck: Normal range of motion. Neck supple.  Cardiovascular: Regular rhythm and intact distal pulses.  Tachycardia present.  No murmur heard. Pulses:      Radial pulses are 2+ on the right side, and 2+ on the left side.       Dorsalis pedis pulses are 2+ on the right side, and 2+ on the left side.       Posterior tibial pulses are 2+ on the right side, and 2+ on the left side.  1+ pitting edema b/l. Equal calf size. No tenderness.   Pulmonary/Chest: Effort normal. He has no decreased breath sounds (right lung throughout). He exhibits no tenderness.  Abdominal: Soft. Bowel sounds are normal. He exhibits distension. There is no tenderness. There is no rigidity, no rebound and no guarding.  Genitourinary: Rectal exam shows guaiac positive stool. Rectal exam shows no tenderness and anal tone normal.  Genitourinary Comments: Stool  melanous in color  Musculoskeletal: He exhibits no edema.  Lymphadenopathy:    He has no cervical adenopathy.  Neurological: He is alert.  Essential tremor of hands noted  Skin: Skin is warm and dry. No rash noted. He is not diaphoretic.  Jaundice. No drainage and erythema surrounding paracentesis site.     Psychiatric: He has a normal mood and affect.  Nursing note and vitals reviewed.    ED Treatments / Results  Labs (all labs ordered are listed, but only abnormal results are displayed) Labs Reviewed  CBC WITH DIFFERENTIAL/PLATELET - Abnormal; Notable for the following:       Result Value   RBC 1.40 (*)    Hemoglobin 5.7 (*)    HCT 17.2 (*)    MCV 122.9 (*)    MCH 40.7 (*)    RDW 19.2 (*)    Platelets 119 (*)    Monocytes Absolute 2.4 (*)    All other components within normal limits  COMPREHENSIVE METABOLIC PANEL - Abnormal; Notable for the following:    Potassium 3.0 (*)    Chloride 95 (*)    Glucose, Bld 144 (*)    BUN 22 (*)    Creatinine, Ser 1.76 (*)    Calcium 8.5 (*)    Total Protein 5.8 (*)    Albumin 2.4 (*)    AST 56 (*)    Total Bilirubin 8.7 (*)    GFR calc non Af Amer 42 (*)    GFR calc Af Amer 48 (*)    All other components within normal limits  PROTIME-INR - Abnormal; Notable for the following:    Prothrombin Time 28.6 (*)    All other components within normal limits  POC OCCULT BLOOD, ED - Abnormal; Notable for the following:    Fecal Occult Bld POSITIVE (*)    All other components within normal limits  BRAIN NATRIURETIC PEPTIDE  OCCULT BLOOD X 1 CARD TO LAB, STOOL  MAGNESIUM  TYPE AND SCREEN  PREPARE RBC (CROSSMATCH)    EKG  EKG Interpretation  Date/Time:  Wednesday June 13 2017 17:59:01 EDT Ventricular Rate:  98 PR Interval:    QRS Duration: 127 QT Interval:  453 QTC Calculation: 579 R Axis:   58 Text Interpretation:  Sinus rhythm IVCD, consider atypical RBBB No significant change since last tracing Confirmed by Theotis Burrow  [phone]) on 06/13/2017 6:33:02 PM       Radiology Dg Chest 2 View  Result Date: 06/13/2017 CLINICAL DATA:  Cough and shortness of breath for 1 month. Weakness. EXAM: CHEST  2 VIEW COMPARISON:  Most recent comparison radiograph 06/01/2017. Chest CT 11/24/2016 FINDINGS: Progressive large right pleural effusion now occupying the lower  2/3 of the right hemithorax. Associated atelectasis without leftward mediastinal shift. Unchanged mediastinal contours, right heart borders obscured by pleural fluid. No pulmonary edema. No pneumothorax. Stable osseous structures. IMPRESSION: Increased right pleural effusion, now large occupying the lower 2/3 of the right hemithorax. Likely associated atelectasis given lack of leftward mediastinal shift. Electronically Signed   By: Jeb Levering M.D.   On: 06/13/2017 18:31    Procedures Procedures (including critical care time)  Medications Ordered in ED Medications  0.9 %  sodium chloride infusion (not administered)  potassium chloride SA (K-DUR,KLOR-CON) CR tablet 40 mEq (not administered)     Initial Impression / Assessment and Plan / ED Course  I have reviewed the triage vital signs and the nursing notes.  Pertinent labs & imaging results that were available during my care of the patient were reviewed by me and considered in my medical decision making (see chart for details).     56 year old male with alcoholic liver cirrhosis presenting today for ongoing SOB. The patient has documented increasing right pleural effusion since 11/24/16. The patient was seen on 06/06/17 and told to get an ultrasound-guided paracentesis and told that if his breathing is still short after paracentesis to call for a repeat chest x-ray and if there is continued pleural effusion he'll be referred for a thoracentesis. The patient had paracentesis performed 2 days ago where 3.5 L of fluid were able to be drawn. The patient has had no relief of SOB and endorses SOB at rest, DOE and  orthopnea. Patient also has increased weakness, decreased appetitie, dry cough. On presentation patient is afebrile and tachycardic. He is non-toxic appearing, with no evidence of accessory muscle use and normal effort. On exam there is decreased breath sounds on the right lung throughout. The patient has 1+ leg swelling bilaterally. Jaundice, scleral icterus, essential tremor and abdominal distension noted. No  drainage and erythema surrounding paracentesis site. Will order Basic lab work, INR, EKG, CXR, BNP.   CXR with increased right pleural effusion, now occupying the lower 2/3 of the right hemithorax. Suspect this is the etiology of the patients SOB. Will wait on pending lab work with plan for admission for a thoracentesis.   EKG shows sinus rhythm with RBBB consistent with past EKG.  Patient with critically low Hgb. Had discussion with patient about transfusion and risks involved. Patient states understanding and wishes to receive transfusion. Will give 2 U of blood. Obtained stool guaiac to check for GI bleed.   INR elevated at 2.63. BUN and Cr elevated. Hypokalemia of 3.0. Albumin low at 2.4 which is consistent with past readings. BNP negative.   Fecal occult blood test positive. Suspect GI bleed as source of anemia.   Will call hospitalist for admission for right pleural effusion, symptomatic anemia with source as GI bleed. Hospitalist agrees to admit and will assume care. Patient is stable at time of transfer of care.   Patient seen and discussed with Dr. Rex Kras who is in agreement with plan.   CRITICAL CARE Performed by: Jillyn Ledger   Total critical care time: 35 minutes  Critical care time was exclusive of separately billable procedures and treating other patients.  Critical care was necessary to treat or prevent imminent or life-threatening deterioration.  Critical care was time spent personally by me on the following activities: development of treatment plan with patient  and/or surrogate as well as nursing, discussions with consultants, evaluation of patient's response to treatment, examination of patient, obtaining history from  patient or surrogate, ordering and performing treatments and interventions, ordering and review of laboratory studies, ordering and review of radiographic studies, pulse oximetry and re-evaluation of patient's condition.   Final Clinical Impressions(s) / ED Diagnoses   Final diagnoses:  Pleural effusion on right  Symptomatic anemia  Gastrointestinal hemorrhage with melena    New Prescriptions New Prescriptions   No medications on file     Lorelle Gibbs 06/13/17 2043    Jillyn Ledger, PA-C 06/13/17 2050    Rex Kras, Wenda Overland, MD 06/15/17 [phone]

## 2017-06-14 ENCOUNTER — Inpatient Hospital Stay (HOSPITAL_COMMUNITY): Payer: BLUE CROSS/BLUE SHIELD

## 2017-06-14 DIAGNOSIS — K922 Gastrointestinal hemorrhage, unspecified: Secondary | ICD-10-CM

## 2017-06-14 LAB — ALBUMIN, PLEURAL OR PERITONEAL FLUID: Albumin, Fluid: <1 g/dL

## 2017-06-14 LAB — CBC WITH DIFFERENTIAL/PLATELET
BASOS ABS: 0.1 10*3/uL (ref 0.0–0.1)
Basophils Relative: 1 %
EOS PCT: 2 %
Eosinophils Absolute: 0.2 10*3/uL (ref 0.0–0.7)
HEMATOCRIT: 24 % — AB (ref 39.0–52.0)
HEMOGLOBIN: 8.3 g/dL — AB (ref 13.0–17.0)
LYMPHS PCT: 13 %
Lymphs Abs: 1 10*3/uL (ref 0.7–4.0)
MCH: 36.2 pg — ABNORMAL HIGH (ref 26.0–34.0)
MCHC: 34.6 g/dL (ref 30.0–36.0)
MCV: 104.8 fL — ABNORMAL HIGH (ref 78.0–100.0)
MONOS PCT: 25 %
Monocytes Absolute: 2 10*3/uL — ABNORMAL HIGH (ref 0.1–1.0)
NEUTROS PCT: 59 %
Neutro Abs: 4.5 10*3/uL (ref 1.7–7.7)
Platelets: 93 10*3/uL — ABNORMAL LOW (ref 150–400)
RBC: 2.29 MIL/uL — AB (ref 4.22–5.81)
RDW: 27.9 % — AB (ref 11.5–15.5)
WBC: 7.8 10*3/uL (ref 4.0–10.5)

## 2017-06-14 LAB — BODY FLUID CELL COUNT WITH DIFFERENTIAL
Eos, Fluid: 0 %
LYMPHS FL: 56 %
Monocyte-Macrophage-Serous Fluid: 38 % — ABNORMAL LOW (ref 50–90)
Neutrophil Count, Fluid: 6 % (ref 0–25)
Total Nucleated Cell Count, Fluid: 299 cu mm (ref 0–1000)

## 2017-06-14 LAB — PROTEIN, PLEURAL OR PERITONEAL FLUID: Total protein, fluid: <3 g/dL

## 2017-06-14 LAB — MRSA PCR SCREENING: MRSA BY PCR: NEGATIVE

## 2017-06-14 LAB — GLUCOSE, PLEURAL OR PERITONEAL FLUID: GLUCOSE FL: 101 mg/dL

## 2017-06-14 LAB — GLUCOSE, CAPILLARY
GLUCOSE-CAPILLARY: 116 mg/dL — AB (ref 65–99)
GLUCOSE-CAPILLARY: 111 mg/dL — AB (ref 65–99)
GLUCOSE-CAPILLARY: 92 mg/dL (ref 65–99)
GLUCOSE-CAPILLARY: 120 mg/dL — AB (ref 65–99)
Glucose-Capillary: 117 mg/dL — ABNORMAL HIGH (ref 65–99)
Glucose-Capillary: 99 mg/dL (ref 65–99)
Glucose-Capillary: 125 mg/dL — ABNORMAL HIGH (ref 65–99)

## 2017-06-14 LAB — AMYLASE, PLEURAL OR PERITONEAL FLUID: AMYLASE FL: 19 U/L

## 2017-06-14 LAB — LACTATE DEHYDROGENASE, PLEURAL OR PERITONEAL FLUID: LD FL: 42 U/L — AB (ref 3–23)

## 2017-06-14 LAB — COMPREHENSIVE METABOLIC PANEL
ALT: 27 U/L (ref 17–63)
ANION GAP: 11 (ref 5–15)
AST: 52 U/L — ABNORMAL HIGH (ref 15–41)
Albumin: 2.3 g/dL — ABNORMAL LOW (ref 3.5–5.0)
Alkaline Phosphatase: 104 U/L (ref 38–126)
BILIRUBIN TOTAL: 9.9 mg/dL — AB (ref 0.3–1.2)
BUN: 21 mg/dL — ABNORMAL HIGH (ref 6–20)
CHLORIDE: 98 mmol/L — AB (ref 101–111)
CO2: 28 mmol/L (ref 22–32)
Calcium: 8.7 mg/dL — ABNORMAL LOW (ref 8.9–10.3)
Creatinine, Ser: 1.54 mg/dL — ABNORMAL HIGH (ref 0.61–1.24)
GFR calc Af Amer: 57 mL/min — ABNORMAL LOW (ref >60)
GFR, EST NON AFRICAN AMERICAN: 49 mL/min — AB (ref >60)
Glucose, Bld: 105 mg/dL — ABNORMAL HIGH (ref 65–99)
POTASSIUM: 3.4 mmol/L — AB (ref 3.5–5.1)
Sodium: 137 mmol/L (ref 135–145)
TOTAL PROTEIN: 5.6 g/dL — AB (ref 6.5–8.1)

## 2017-06-14 LAB — GRAM STAIN

## 2017-06-14 LAB — LACTATE DEHYDROGENASE: LDH: 249 U/L — ABNORMAL HIGH (ref 98–192)

## 2017-06-14 LAB — PROTIME-INR
INR: 2.61
INR: 2.12
PROTHROMBIN TIME: 24.1 s — AB (ref 11.4–15.2)
Prothrombin Time: 28.4 seconds — ABNORMAL HIGH (ref 11.4–15.2)

## 2017-06-14 LAB — AMMONIA: AMMONIA: 89 umol/L — AB (ref 9–35)

## 2017-06-14 MED ORDER — DEXTROSE 5 % IV SOLN
1.0000 g | INTRAVENOUS | Status: DC
  Administered 2017-06-14 – 2017-06-15 (×2): 1 g via INTRAVENOUS
  Filled 2017-06-14 (×2): qty 10

## 2017-06-14 MED ORDER — SODIUM CHLORIDE 0.9 % IV SOLN: Freq: Once | INTRAVENOUS | Status: DC

## 2017-06-14 MED ORDER — POTASSIUM CHLORIDE 10 MEQ/100ML IV SOLN
10.0000 meq | INTRAVENOUS | Status: DC
  Administered 2017-06-14: 10 meq via INTRAVENOUS
  Filled 2017-06-14 (×2): qty 100

## 2017-06-14 MED ORDER — POTASSIUM CHLORIDE CRYS ER 20 MEQ PO TBCR
40.0000 meq | EXTENDED_RELEASE_TABLET | Freq: Once | ORAL | Status: AC
  Administered 2017-06-14: 40 meq via ORAL
  Filled 2017-06-14: qty 2

## 2017-06-14 NOTE — Progress Notes (Signed)
PROGRESS NOTE    [name]  TKW:[id number] DOB: [birth year] DOA: 06/13/2017 PCP: Alroy Dust, L.Marlou Sa, MD     Brief Narrative:  Thomas [name] is a 56 y.o. male with medical history significant for alcoholic liver cirrhosis with resistant ascites, esophageal varices, history of SBP, chronic macrocytic anemia, and chronic thrombocytopenia, now presenting to the emergency department for evaluation of generalized weakness and shortness of breath, black tarry stools. He underwent therapeutic paracentesis on 7/2 with removal of 3.5L to see if it would help with his breathing. In the ED, work up revealed increased right pleural effusion, anemia with Hgb 5.7, INR 2.63. He was given 2u pRBC, 5mg  Vit K and admitted to stepdown unit. GI was consulted.   Assessment & Plan:   Principal Problem:   Upper GI bleed Active Problems:   Acute kidney injury (HCC)   Macrocytic anemia   Thrombocytopenia (HCC)   Esophageal varices (HCC)   Acute blood loss anemia   End stage liver disease (HCC)   Alcoholic cirrhosis of liver with ascites (HCC)   Pleural effusion associated with hepatic disorder   Hypokalemia   Symptomatic anemia   Decompensation of cirrhosis of liver (HCC)   Acute upper GI bleed   Symptomatic anemia, melena   - Pt presents with SOB, fatigue, black stools. Found to have Hgb 5.7, with DRE revealing black sticky stool with FOBT+. Has known varices.  - 2u pRBC transfused 7/5, improvement in Hgb this morning - GI consulted  - IV Protonix - Cipro for SBP ppx in GIB with cirrhosis   Right pleural effusion, large  - Pt has known right pleural effusion followed by pulmonology - FFP to reverse INR for therapeutic thoracentesis attempt   Decompensated alcoholic cirrhosis  - Following at [facility] transplant center and with [facility]  - Has diuretic-resistant ascites; had therapeutic paracentesis on 06/11/17 with 3.5 L off  - MELD score 32 on admission, corresponding to estimated 53% 4-month mortality    - Has known varices, not on beta-blocker; hx of SBP on prophylactic Cipro  - Aldactone and Lasix held given the AKI  - Lactulose on hold due to NPO status, may require lactulose enema pending GI plan   Acute kidney injury - SCr is 1.74 on admission, up from apparent baseline of 1.2  - Likely prerenal in setting of suspected GIB  - Improving with IVF   Hypokalemia - Replace     DVT prophylaxis: SCD Code Status: DNR Family Communication: wife at bedside Disposition Plan: pending improvement, stabilization   Consultants:   GI  Procedures:   None  Antimicrobials:  Anti-infectives    Start     Dose/Rate Route Frequency Ordered Stop   06/13/17 2100  ciprofloxacin (CIPRO) tablet 500 mg     500 mg Oral Daily 06/13/17 2058          Subjective: Patient somewhat confused this morning. He is alert and verbal, continues to complain about getting up to use the bathroom although repeatedly reminded about condom catheter in place. He has no complaints, but per wife, he has had continued SOB at rest and with exertion despite recent paracentesis. Has had black and maroon stools that they were attributing to his iron supplement and hemorrhoids.   Objective: Vitals:   06/14/17 0534 06/14/17 0600 06/14/17 0700 06/14/17 0800  BP: (!) 121/59 139/73 (!) 150/65 140/81  Pulse: 95     Resp: (!) 23 (!) 30 18 (!) 21  Temp: 97.7 F (36.5 C)   97.6  F (36.4 C)  TempSrc: Oral   Oral  SpO2: 100% 95% 97% 96%  Weight:      Height:        Intake/Output Summary (Last 24 hours) at 06/14/17 1017 Last data filed at 06/14/17 0800  Gross per 24 hour  Intake          1291.25 ml  Output              400 ml  Net           891.25 ml   Filed Weights   06/14/17 0000  Weight: 83.3 kg (183 lb 10.3 oz)    Examination:  General exam: Appears calm and comfortable,  Respiratory system: Clear to auscultation on left, diminished on right, Respiratory effort normal. Cardiovascular system: S1 & S2  heard, RRR. No JVD, murmurs, rubs, gallops or clicks. No pedal edema. Gastrointestinal system: Abdomen is nondistended, soft and nontender. Normal bowel sounds heard. Central nervous system: Alert and oriented, but somewhat confused. No focal neurological deficits. Extremities: Symmetric 5 x 5 power. Skin: No rashes, lesions or ulcers, jaundiced   Data Reviewed: I have personally reviewed following labs and imaging studies  CBC:  Recent Labs Lab 06/13/17 1856 06/14/17 0750  WBC 9.7 7.8  NEUTROABS 5.7 4.5  HGB 5.7* 8.3*  HCT 17.2* 24.0*  MCV 122.9* 104.8*  PLT 119* 93*   Basic Metabolic Panel:  Recent Labs Lab 06/13/17 1856 06/14/17 0750  NA 135 137  K 3.0* 3.4*  CL 95* 98*  CO2 29 28  GLUCOSE 144* 105*  BUN 22* 21*  CREATININE 1.76* 1.54*  CALCIUM 8.5* 8.7*  MG 1.5*  --    GFR: Estimated Creatinine Clearance: 52.7 mL/min (A) (by C-G formula based on SCr of 1.54 mg/dL (H)). Liver Function Tests:  Recent Labs Lab 06/13/17 1856 06/14/17 0750  AST 56* 52*  ALT 26 27  ALKPHOS 106 104  BILITOT 8.7* 9.9*  PROT 5.8* 5.6*  ALBUMIN 2.4* 2.3*   No results for input(s): LIPASE, AMYLASE in the last 168 hours.  Recent Labs Lab 06/14/17 0750  AMMONIA 89*   Coagulation Profile:  Recent Labs Lab 06/13/17 1856 06/14/17 0750  INR 2.63 2.61   Cardiac Enzymes: No results for input(s): CKTOTAL, CKMB, CKMBINDEX, TROPONINI in the last 168 hours. BNP (last 3 results) No results for input(s): PROBNP in the last 8760 hours. HbA1C: No results for input(s): HGBA1C in the last 72 hours. CBG:  Recent Labs Lab 06/14/17 0110 06/14/17 0319 06/14/17 0741  GLUCAP 117* 116* 111*   Lipid Profile: No results for input(s): CHOL, HDL, LDLCALC, TRIG, CHOLHDL, LDLDIRECT in the last 72 hours. Thyroid Function Tests: No results for input(s): TSH, T4TOTAL, FREET4, T3FREE, THYROIDAB in the last 72 hours. Anemia Panel: No results for input(s): VITAMINB12, FOLATE, FERRITIN,  TIBC, IRON, RETICCTPCT in the last 72 hours. Sepsis Labs: No results for input(s): PROCALCITON, LATICACIDVEN in the last 168 hours.  Recent Results (from the past 240 hour(s))  MRSA PCR Screening     Status: None   Collection Time: 06/13/17 11:50 PM  Result Value Ref Range Status   MRSA by PCR NEGATIVE NEGATIVE Final    Comment:        The GeneXpert MRSA Assay (FDA approved for NASAL specimens only), is one component of a comprehensive MRSA colonization surveillance program. It is not intended to diagnose MRSA infection nor to guide or monitor treatment for MRSA infections.        Radiology  Studies: Dg Chest 2 View  Result Date: 06/13/2017 CLINICAL DATA:  Cough and shortness of breath for 1 month. Weakness. EXAM: CHEST  2 VIEW COMPARISON:  Most recent comparison radiograph 06/01/2017. Chest CT 11/24/2016 FINDINGS: Progressive large right pleural effusion now occupying the lower 2/3 of the right hemithorax. Associated atelectasis without leftward mediastinal shift. Unchanged mediastinal contours, right heart borders obscured by pleural fluid. No pulmonary edema. No pneumothorax. Stable osseous structures. IMPRESSION: Increased right pleural effusion, now large occupying the lower 2/3 of the right hemithorax. Likely associated atelectasis given lack of leftward mediastinal shift. Electronically Signed   By: Jeb Levering M.D.   On: 06/13/2017 18:31      Scheduled Meds: . ciprofloxacin  500 mg Oral Daily  . folic acid  1 mg Oral QHS  . insulin aspart  0-9 Units Subcutaneous Q4H  . lactulose  20 g Oral BID  . [START ON 06/17/2017] pantoprazole  40 mg Intravenous Q12H  . sodium chloride flush  3 mL Intravenous Q12H   Continuous Infusions: . pantoprozole (PROTONIX) infusion 8 mg/hr (06/14/17 0812)     LOS: 1 day    Time spent: 40 minutes   Dessa Phi, DO [facility] [email or web address] Password [email or web address] 06/14/2017, 10:17 AM

## 2017-06-14 NOTE — Consult Note (Addendum)
Consultation  Referring Provider:  Dr. Maylene Roes    Primary Care Physician:  Alroy Dust, Carlean Jews.Marlou Sa, MD Primary Gastroenterologist: Dr. Alben Deeds        Reason for Consultation:  Symptomatic Anemia, Decompensated Alcoholic Cirrhosis            HPI:   Thomas [name] is a 56 y.o. Caucasian male with a past medical history of alcoholic cirrhosis complicated by portal hypertension with history of small esophageal varices, portal hypertensive gastropathy, hepatic encephalopathy, ascites with history of SBP, issues with acute kidney injury and renal dysfunction as well as anemia, who presented to the ER on 06/13/17 with a complaint of generalized weakness and increasing shortness of breath.   This patient is well known to our service and in fact was seen in clinic last by Dr. Hilarie Fredrickson on 05/14/17 for follow-up after a recent hospitalization from 04/27/17-04/30/17 for healthcare associated pneumonia, upper GI bleed due to severe portal gastropathy, acute on chronic kidney injury electrolyte balance. He had an upper endoscopy at that time performed by Dr. Ardis Hughs which showed trace to small distal esophageal varices and moderate to severe portal gastropathy. He was noted not a candidate for beta blocker given his acute kidney injury and recent renal insufficiency. At time of that office visit the patient had been using Lasix 60 mg and Aldactone 100 mg daily. Lactulose 30 g twice a day, pantoprazole 40 mg twice a day, ciprofloxacin 500 mg daily at bedtime and oral iron was being taken at least once at night as well as potassium chloride 10 mEq daily. At that office visit he was arranged for a repeat large-volume paracentesis with IV albumin. It was noted that he was not currently a TIPS candidate due to his kidney injury.   Patient then recently followed with [facility] on 05/16/17 and is currently being arranged for placement on liver transplant list.   Today, the patient is found asleep in bed with his wife and his mother  by his side. His wife does much of the explaining as patient is lethargic during time of my interview. She explains that things have been pretty consistent until around Monday when the patient went in to have a paracentesis due to some increase in shortness of breath. This was ordered by pulmonary medicine on 06/06/17 after being seen there. It was noted at that time the patient had a right pleural effusion. It was thought that paracentesis would help with his increasing shortness of breath. Patient's wife tells me that he had removal of at least 3 L of fluid and had brief respite from his trouble with breathing but this all started to increase the next day on 06/12/17 and worsened over that day. Patient also became somewhat "confused", per the patient's wife he "did not know details of his life that he typically does". She brought him into the ER yesterday. Patient was found to have a low hemoglobin has already received 2 units of PRBCs. He has remained somewhat lethargic during his stay. Patient's wife tells me has been using his iron which was prescribed 3 times daily only once daily. He has noted some maroon-colored stools over the past week but thought this was related to his hemorrhoids. She tells me his stools are always "black", but they think that this is from the iron. Patient's wife does tell me that he has been having trouble with his lactulose noting that he gets severely nauseous after taking his dose. He typically only uses this twice a  day.   Patient's social history is positive for being arranged for pretransfusion testing starting 06/18/17 at [facility]. His wife pleads with me today to "make him better before this appointment".   Patient and his wife deny fever, chills, abdominal pain, dizziness, heartburn, reflux or syncope.  ED Course: Upon arrival to the ED, patient is found to be afebrile, saturating 90% on room air, slightly tachycardic, and with vitals otherwise stable. EKG features a sinus rhythm  with nonspecific interventricular conduction delay. Chest x-ray is notable for increased right pleural effusion, now large, occupying the lower two-thirds of the hemithorax. Chemistry panels notable for a potassium of 3.0, magnesium 1.5, total bilirubin of 8.7, and serum creatinine of 1.76, up from a recent value of 1.2. CBC is notable for macrocytic anemia with hemoglobin of 5.7, down from priors in the 8 range. There is a stable thrombocytopenia with platelets 119,000. INR is elevated to 2.63, similar to priors. Fecal occult blood testing is positive and stool was noted to be black and sticky. Patient was given 40 mEq oral potassium in the emergency department and 2 units of packed red blood cells were ordered for immediate transfusion. The patient remained hemodynamically stable and has not been in acute respiratory distress. He will be admitted to the stepdown unit for ongoing evaluation and management of generalized weakness and shortness of breath, suspected secondary to acute on chronic anemia from likely GI bleeding, and also an enlarging right pleural effusion that is contributing to his dyspnea.   Past Medical History:  Diagnosis Date  . Alcohol abuse 2013  . Alcoholic cirrhosis ([place]) 0/9323  . Anemia 02/2016   in setting of GI blood loss, but MCV macrocytic.   . Coagulopathy ([place]) 05/2015  . Diverticulosis 09/2015  . Esophageal varices ([place]) 09/2015   small  . GERD (gastroesophageal reflux disease)   . Hyperlipidemia   . Hypertension   . Pneumonia   . Portal hypertension ([place]) 09/2015   with portal gastropathy  . Thrombocytopenia ([place]) 02/2016  . Tubular adenoma of colon 09/2015    Past Surgical History:  Procedure Laterality Date  . ESOPHAGOGASTRODUODENOSCOPY N/A 03/05/2016   Procedure: ESOPHAGOGASTRODUODENOSCOPY (EGD);  Surgeon: Ladene Artist, MD;  Location: [name];  Service: Endoscopy;  Laterality: N/A;  . ESOPHAGOGASTRODUODENOSCOPY N/A 04/29/2017   Procedure:  ESOPHAGOGASTRODUODENOSCOPY (EGD);  Surgeon: Milus Banister, MD;  Location: [name];  Service: Endoscopy;  Laterality: N/A;  . ESOPHAGOGASTRODUODENOSCOPY (EGD) WITH PROPOFOL N/A 03/29/2017   Procedure: ESOPHAGOGASTRODUODENOSCOPY (EGD) WITH PROPOFOL;  Surgeon: Doran Stabler, MD;  Location: [facility];  Service: Endoscopy;  Laterality: N/A;  . IR PARACENTESIS  05/15/2017  . IR PARACENTESIS  06/11/2017  . testicle prosthesis     Family History  Problem Relation Age of Onset  . Diabetes Father   . Parkinson's disease Father   . Diabetes Paternal Aunt   . Glaucoma Mother    Social History  Substance Use Topics  . Smoking status: Never Smoker  . Smokeless tobacco: Never Used  . Alcohol use No     Comment: Quit 2/17, relapse in 1/18 for short period of time   Prior to Admission medications   Medication Sig Start Date End Date Taking? Authorizing Provider  aspirin-sod bicarb-citric acid (ALKA-SELTZER) 325 MG TBEF tablet Take 325 mg by mouth every 6 (six) hours as needed.   Yes [provider]  ciprofloxacin (CIPRO) 500 MG tablet Take 500 mg by mouth every evening.   Yes [provider]  ferrous sulfate 325 (65 FE) MG tablet Take 1 tablet (325 mg total) by mouth 3 (three) times daily with meals. Patient taking differently: Take 325 mg by mouth every evening.  04/30/17  Yes Patrecia Pour, Christean Grief, MD  folic acid (FOLVITE) 1 MG tablet Take 1 tablet (1 mg total) by mouth at bedtime. 04/30/17  Yes Patrecia Pour, Christean Grief, MD  furosemide (LASIX) 40 MG tablet Take 1.5 tablets (60 mg total) by mouth daily. 04/30/17  Yes Patrecia Pour, Christean Grief, MD  lactulose (CHRONULAC) 10 GM/15ML solution Take 30 mLs (20 g total) by mouth 2 (two) times daily. 05/14/17  Yes Pyrtle, Lajuan Lines, MD  pantoprazole (PROTONIX) 40 MG tablet TAKE 1 TABLET (40 MG TOTAL) BY MOUTH 2 (TWO) TIMES DAILY. 03/10/16  Yes Pyrtle, Lajuan Lines, MD  potassium chloride (K-DUR,KLOR-CON) 10 MEQ tablet Take 1 tablet (10 mEq total) by mouth  daily. 05/01/17  Yes Patrecia Pour, Christean Grief, MD  spironolactone (ALDACTONE) 100 MG tablet Take 1 tablet (100 mg total) by mouth daily. 04/30/17  Yes Doreatha Lew, MD    Current Facility-Administered Medications  Medication Dose Route Frequency Provider Last Rate Last Dose  . ciprofloxacin (CIPRO) tablet 500 mg  500 mg Oral Daily Opyd, Ilene Qua, MD      . folic acid (FOLVITE) tablet 1 mg  1 mg Oral QHS Opyd, Ilene Qua, MD      . insulin aspart (novoLOG) injection 0-9 Units  0-9 Units Subcutaneous Q4H Opyd, Ilene Qua, MD      . lactulose (CHRONULAC) 10 GM/15ML solution 20 g  20 g Oral BID Opyd, Ilene Qua, MD      . ondansetron (ZOFRAN) tablet 4 mg  4 mg Oral Q6H PRN Opyd, Ilene Qua, MD       Or  . ondansetron (ZOFRAN) injection 4 mg  4 mg Intravenous Q6H PRN Opyd, Ilene Qua, MD      . oxyCODONE (Oxy IR/ROXICODONE) immediate release tablet 5 mg  5 mg Oral Q4H PRN Opyd, Ilene Qua, MD      . pantoprazole (PROTONIX) 80 mg in sodium chloride 0.9 % 250 mL (0.32 mg/mL) infusion  8 mg/hr Intravenous Continuous Opyd, Ilene Qua, MD 25 mL/hr at 06/14/17 0812 8 mg/hr at 06/14/17 0812  . [START ON 06/17/2017] pantoprazole (PROTONIX) injection 40 mg  40 mg Intravenous Q12H Opyd, Timothy S, MD      . sodium chloride flush (NS) 0.9 % injection 3 mL  3 mL Intravenous Q12H Opyd, Ilene Qua, MD   3 mL at 06/13/17 2200    Allergies as of 06/13/2017 - Review Complete 06/13/2017  Allergen Reaction Noted  . Rifaximin Swelling and Other (See Comments) 11/25/2016   Review of Systems:    Constitutional: No weight loss, fever or chills Skin: No rash  Cardiovascular: No chest pain Respiratory: See HPI Gastrointestinal: See HPI and otherwise negative Genitourinary: No dysuria  Neurological: No headache, dizziness or syncope Musculoskeletal: No new muscle or joint pain Hematologic: No bruising Psychiatric: No history of depression or anxiety   Physical Exam:  Vital signs in last 24 hours: Temp:  [97.6 F (36.4  C)-98.4 F (36.9 C)] 97.6 F (36.4 C) (07/05 0800) Pulse Rate:  [92-107] 95 (07/05 0534) Resp:  [12-30] 21 (07/05 0800) BP: (114-150)/(49-81) 140/81 (07/05 0800) SpO2:  [90 %-100 %] 96 % (07/05 0800) Weight:  [183 lb 10.3 oz (83.3 kg)] 183 lb 10.3 oz (83.3 kg) (07/05 0000) Last BM Date: 06/13/17 General: Lethargic, Jaundiced Ill appearing Caucasian male appears  to be in NAD, Well developed, Well nourished Head:  Normocephalic and atraumatic. Eyes:   PEERL, EOMI. Icteric. Conjunctiva pink. Ears:  Normal auditory acuity. Neck:  Supple Throat: Oral cavity and pharynx without inflammation, swelling or lesion. Lungs: Respirations even and unlabored. Decreased on right  No wheezes, crackles, or rhonchi.  Heart: Normal S1, S2. No MRG. Regular rate and rhythm. No peripheral edema, cyanosis or pallor.  Abdomen:  Soft, Moderate distension, nontender. No rebound or guarding. Decreased BS all four quadrants No appreciable masses or hepatomegaly. Rectal:  Not performed.  Msk:  Symmetrical without gross deformities.  Extremities:  3+ b/l LE edema, no deformity or joint abnormality.  Neurologic: grossly normal neurologically.  Skin:   Dry and intact without significant lesions or rashes. Psychiatric: Lethargic    LAB RESULTS:  Recent Labs  06/13/17 1856 06/14/17 0750  WBC 9.7 7.8  HGB 5.7* 8.3*  HCT 17.2* 24.0*  PLT 119* 93*   BMET  Recent Labs  06/13/17 1856 06/14/17 0750  NA 135 137  K 3.0* 3.4*  CL 95* 98*  CO2 29 28  GLUCOSE 144* 105*  BUN 22* 21*  CREATININE 1.76* 1.54*  CALCIUM 8.5* 8.7*   LFT  Recent Labs  06/14/17 0750  PROT 5.6*  ALBUMIN 2.3*  AST 52*  ALT 27  ALKPHOS 104  BILITOT 9.9*   PT/INR  Recent Labs  06/13/17 1856 06/14/17 0750  LABPROT 28.6* 28.4*  INR 2.63 2.61    STUDIES: Dg Chest 2 View  Result Date: 06/13/2017 CLINICAL DATA:  Cough and shortness of breath for 1 month. Weakness. EXAM: CHEST  2 VIEW COMPARISON:  Most recent comparison  radiograph 06/01/2017. Chest CT 11/24/2016 FINDINGS: Progressive large right pleural effusion now occupying the lower 2/3 of the right hemithorax. Associated atelectasis without leftward mediastinal shift. Unchanged mediastinal contours, right heart borders obscured by pleural fluid. No pulmonary edema. No pneumothorax. Stable osseous structures. IMPRESSION: Increased right pleural effusion, now large occupying the lower 2/3 of the right hemithorax. Likely associated atelectasis given lack of leftward mediastinal shift. Electronically Signed   By: Jeb Levering M.D.   On: 06/13/2017 18:31     PREVIOUS ENDOSCOPIES:            See HPI and Chart review   Impression / Plan:   Impression: 1. Symptomatic anemia: 06/13/17 hemoglobin 5.7, this morning after 2 units PRBCs is now up to 8.3; most recent EGD 04/29/17 with marked portal gastropathy and small esophageal varices, patient was placed on oral iron at that time which he has been taking once a day at night per his wife; likely patient continues to have issues from his portal gastropathy, at this time no plans for repeat EGD, pending any signs of acute GI bleed 2. Melena: See above 3. Right pleural effusion: This is known to pulmonology, admission CXR demonstrates enlargement, no occupying lower two thirds of the thorax, likely secondary to decompensated cirrhosis, therapeutic paracentesis thought "risky" due to INR 2.6, FFP is ordered 4. Decompensated alcoholic cirrhosis: Patient is currently following with the transplant center, he has diuretic resistant ascites and has had therapeutic paracentesis as recently as 06/11/17 with 3.5 L removed, meld is 32 at admission, known varices, not on a beta blocker due to kidney injury, history of SBP on prophylactic Cipro 5. Acute kidney injury: Acute on chronic, this admission creatinine is 1.74 from baseline of 1.2 6. Hypokalemia, hypomagnesemia  Plan: 1. Discussed this very complicated patient with Dr. Ardis Hughs  this morning.  He is planning to discuss this patient with [facility] later today, patient may benefit from transfer there. 2. Patient may be on regular diet at this time. We do not plan on EGD as of now. Can restart Lactulose. 3. Please await any further recommendations from Dr. Ardis Hughs  Thank you for your kind consultation, we will continue to follow.  Lavone Nian Lemmon  06/14/2017, 10:00 AM Pager #: [phone]   ________________________________________________________________________  [facility] MD note:  I personally examined the patient, reviewed the data and agree with the assessment and plan described above.  Complex situation. His chronic GI bleeding is almost certainly from the mod to severe portal gastropathy.  He is intolerant of non-selective B blocker and NOT a TIPS candidate.  I think only option if that he stay on iron replacement and get periodic blood transfusion PRN.  His R pleural effusion is larger, hospitalist team is arranging thoracentesis which I agree with.  I have spoken with [facility] and am expecting a call back soon about what they recommend; in patient transfer or home after stablization here so that he can make it to several outpatient [facility] appts that are currently scheduled for early next week.   Owens Loffler, MD [facility] Pager [phone]    Addendum: I spoke with a [facility] RN. First, he is not currently on the transplant list. The upcoming battery of [facility] tests, appts starting on Monday will determine if he makes the list.  We agreed that we will optimize Edel's condition so that he can plan to make those out patient appointments.  If he tolerates thoracentesis, he can go home while staying on all his outpatient meds that he was taking prior to this admission. She also explained that it is not uncommon to be admitted during the Monday testing to facilitate the process as an inpatient instead.   She provided me with Liver Transplant RN  call number for the weekend as well [phone].  Radonna Ricker

## 2017-06-14 NOTE — Procedures (Signed)
Ultrasound-guided diagnostic and therapeutic right thoracentesis performed yielding 1.9 liters of blood-tinged/amber fluid. No immediate complications. Follow-up chest x-ray pending. A portion of the fluid was sent to the lab for preordered studies.

## 2017-06-15 LAB — PH, BODY FLUID: pH, Body Fluid: 8

## 2017-06-15 LAB — GLUCOSE, CAPILLARY: Glucose-Capillary: 86 mg/dL (ref 65–99)

## 2017-06-15 LAB — COMPREHENSIVE METABOLIC PANEL
ALT: 25 U/L (ref 17–63)
AST: 48 U/L — AB (ref 15–41)
Albumin: 2.2 g/dL — ABNORMAL LOW (ref 3.5–5.0)
Alkaline Phosphatase: 91 U/L (ref 38–126)
Anion gap: 8 (ref 5–15)
BUN: 19 mg/dL (ref 6–20)
CHLORIDE: 102 mmol/L (ref 101–111)
CO2: 27 mmol/L (ref 22–32)
CREATININE: 1.27 mg/dL — AB (ref 0.61–1.24)
Calcium: 8.4 mg/dL — ABNORMAL LOW (ref 8.9–10.3)
GFR calc Af Amer: >60 mL/min (ref >60)
GFR calc non Af Amer: >60 mL/min (ref >60)
Glucose, Bld: 95 mg/dL (ref 65–99)
Potassium: 3.9 mmol/L (ref 3.5–5.1)
SODIUM: 137 mmol/L (ref 135–145)
Total Bilirubin: 8.9 mg/dL — ABNORMAL HIGH (ref 0.3–1.2)
Total Protein: 5.4 g/dL — ABNORMAL LOW (ref 6.5–8.1)

## 2017-06-15 LAB — PREPARE FRESH FROZEN PLASMA
UNIT DIVISION: 0
UNIT DIVISION: 0

## 2017-06-15 LAB — CBC WITH DIFFERENTIAL/PLATELET
BASOS ABS: 0 10*3/uL (ref 0.0–0.1)
Basophils Relative: 0 %
EOS ABS: 0.2 10*3/uL (ref 0.0–0.7)
EOS PCT: 2 %
HCT: 22.2 % — ABNORMAL LOW (ref 39.0–52.0)
Hemoglobin: 7.7 g/dL — ABNORMAL LOW (ref 13.0–17.0)
Lymphocytes Relative: 14 %
Lymphs Abs: 1.2 10*3/uL (ref 0.7–4.0)
MCH: 37 pg — ABNORMAL HIGH (ref 26.0–34.0)
MCHC: 34.7 g/dL (ref 30.0–36.0)
MCV: 106.7 fL — ABNORMAL HIGH (ref 78.0–100.0)
Monocytes Absolute: 1.8 10*3/uL — ABNORMAL HIGH (ref 0.1–1.0)
Monocytes Relative: 22 %
Neutro Abs: 5 10*3/uL (ref 1.7–7.7)
Neutrophils Relative %: 62 %
PLATELETS: 90 10*3/uL — AB (ref 150–400)
RBC: 2.08 MIL/uL — AB (ref 4.22–5.81)
WBC: 8.4 10*3/uL (ref 4.0–10.5)

## 2017-06-15 LAB — BPAM FFP
BLOOD PRODUCT EXPIRATION DATE: 201807102359
BLOOD PRODUCT EXPIRATION DATE: 201807102359
ISSUE DATE / TIME: 201807051230
ISSUE DATE / TIME: 201807051406
UNIT TYPE AND RH: 6200
UNIT TYPE AND RH: 6200

## 2017-06-15 LAB — PROTIME-INR
INR: 2.19
Prothrombin Time: 24.8 seconds — ABNORMAL HIGH (ref 11.4–15.2)

## 2017-06-15 LAB — HEMOGLOBIN A1C
Hgb A1c MFr Bld: <4.2 % — ABNORMAL LOW (ref 4.8–5.6)
Mean Plasma Glucose: <74 mg/dL

## 2017-06-15 NOTE — Progress Notes (Signed)
[facility] Progress Note    Since last GI note: He feels a lot better after 1.9 liter thoracentesis yesterday.  Breathing much easier and appetite is improved. Still with nagging cough.  Objective: Vital signs in last 24 hours: Temp:  [97.3 F (36.3 C)-98.9 F (37.2 C)] 98.2 F (36.8 C) (07/06 0421) Resp:  [16-33] 26 (07/06 0400) BP: (118-137)/(48-82) 133/57 (07/06 0400) SpO2:  [93 %-99 %] 96 % (07/06 0400) Weight:  [182 lb 8.7 oz (82.8 kg)] 182 lb 8.7 oz (82.8 kg) (07/06 0423) Last BM Date: 06/15/17 General: alert and oriented times 3 Heart: regular rate and rythm Abdomen: soft, non-tender, moderate ascites, normal bowel sounds Jaundiced, + scleral icterus  Lab Results:  Recent Labs  06/13/17 1856 06/14/17 0750 06/15/17 0259  WBC 9.7 7.8 8.4  HGB 5.7* 8.3* 7.7*  PLT 119* 93* 90*  MCV 122.9* 104.8* 106.7*    Recent Labs  06/13/17 1856 06/14/17 0750 06/15/17 0259  NA 135 137 137  K 3.0* 3.4* 3.9  CL 95* 98* 102  CO2 29 28 27   GLUCOSE 144* 105* 95  BUN 22* 21* 19  CREATININE 1.76* 1.54* 1.27*  CALCIUM 8.5* 8.7* 8.4*    Recent Labs  06/13/17 1856 06/14/17 0750 06/15/17 0259  PROT 5.8* 5.6* 5.4*  ALBUMIN 2.4* 2.3* 2.2*  AST 56* 52* 48*  ALT 26 27 25   ALKPHOS 106 104 91  BILITOT 8.7* 9.9* 8.9*    Recent Labs  06/14/17 1812 06/15/17 0259  INR 2.12 2.19    Medications: Scheduled Meds: . folic acid  1 mg Oral QHS  . lactulose  20 g Oral BID  . [START ON 06/17/2017] pantoprazole  40 mg Intravenous Q12H  . sodium chloride flush  3 mL Intravenous Q12H   Continuous Infusions: . sodium chloride    . cefTRIAXone (ROCEPHIN)  IV Stopped (06/14/17 1709)  . pantoprozole (PROTONIX) infusion 8 mg/hr (06/15/17 0543)   PRN Meds:.ondansetron **OR** ondansetron (ZOFRAN) IV, oxyCODONE    Assessment/Plan: 56 y.o. male with etoh related cirrhosis, decompensated  Would watch another 24 hours and if Hb drips much further he should get another  1-2 unit blood transfusion prior to discharge.  He is going to meet with [facility] team on Monday for a battery of tests over 2-3 days.  He understands that he may be admitted to [facility] to facilitate that workup.  If he is unable to be discharged tomorrow, will need to work on inpatient transfer to [facility]. I spoke with one of the transplant RNs yesterday about that possibility.   Dr. Collene Mares is covering [facility] this weekend.   Milus Banister, MD  06/15/2017, 8:12 AM [facility] Pager [phone]

## 2017-06-15 NOTE — Progress Notes (Signed)
PROGRESS NOTE    Thomas [name]  ALP:[id number] DOB: March 13, 1961 DOA: 06/13/2017 PCP: Alroy Dust, L.Marlou Sa, MD     Brief Narrative:  Thomas [name] is a 56 y.o. male with medical history significant for alcoholic liver cirrhosis with resistant ascites, esophageal varices, history of SBP, chronic macrocytic anemia, and chronic thrombocytopenia, now presenting to the emergency department for evaluation of generalized weakness and shortness of breath, black tarry stools. He underwent therapeutic paracentesis on 7/2 with removal of 3.5L to see if it would help with his breathing. In the ED, work up revealed increased right pleural effusion, anemia with Hgb 5.7, INR 2.63. He was given 2u pRBC, 5mg  Vit K and admitted to stepdown unit. GI was consulted.   Assessment & Plan:   Principal Problem:   Upper GI bleed Active Problems:   Acute kidney injury (HCC)   Macrocytic anemia   Thrombocytopenia (HCC)   Esophageal varices (HCC)   Acute blood loss anemia   End stage liver disease (HCC)   Alcoholic cirrhosis of liver with ascites (HCC)   Pleural effusion associated with hepatic disorder   Hypokalemia   Symptomatic anemia   Decompensation of cirrhosis of liver (HCC)   Acute upper GI bleed   Symptomatic anemia, melena   - Pt presents with SOB, fatigue, black stools. Found to have Hgb 5.7, with DRE revealing black sticky stool with FOBT+. Has known varices.  - 2u pRBC transfused 7/5 - GI following  - Rocephin for SBP ppx  - Trend CBC, plan to transfuse if Hgb < 7 tomorrow   Right pleural effusion, large  - Pt has known right pleural effusion followed by pulmonology - S/p thoracentesis 7/5 with improvement in effusion, fluid transudative    Decompensated alcoholic cirrhosis  - Following at [facility] transplant center and with [facility]  - Has diuretic-resistant ascites; had therapeutic paracentesis on 06/11/17 with 3.5 L off  - MELD score 32 on admission, corresponding to estimated 53% 62-month mortality    - Has known varices, not on beta-blocker; hx of SBP on prophylactic Cipro  - Aldactone and Lasix held due to AKI on admission  - Continue Lactulose   Acute kidney injury - SCr is 1.74 on admission, up from apparent baseline of 1.2  - Likely prerenal in setting of suspected GIB  - Back to baseline with IVF support    DVT prophylaxis: SCD Code Status: DNR Family Communication: wife at bedside Disposition Plan: pending repeat Hgb tomorrow, if stable plan for discharge home with close outpatient follow up at [facility], transfer out of stepdown unit today    Consultants:   GI  Procedures:   None  Antimicrobials:  Anti-infectives    Start     Dose/Rate Route Frequency Ordered Stop   06/14/17 1600  cefTRIAXone (ROCEPHIN) 1 g in dextrose 5 % 50 mL IVPB     1 g 100 mL/hr over 30 Minutes Intravenous Every 24 hours 06/14/17 1532     06/13/17 2100  ciprofloxacin (CIPRO) tablet 500 mg  Status:  Discontinued     500 mg Oral Daily 06/13/17 2058 06/14/17 1532       Subjective: Patient feeling much better today. No longer restless, confused. Breathing is better, although has a dry cough. No abdominal pain, nausea, vomiting. Had couple of BM yesterday which remained dark.    Objective: Vitals:   06/15/17 0300 06/15/17 0400 06/15/17 0421 06/15/17 0423  BP: 120/61 (!) 133/57    Pulse:      Resp: (!) 27 Marland Kitchen)  26    Temp:   98.2 F (36.8 C)   TempSrc:   Oral   SpO2: 97% 96%    Weight:   82.8 kg (182 lb 8.7 oz) 82.8 kg (182 lb 8.7 oz)  Height:        Intake/Output Summary (Last 24 hours) at 06/15/17 1009 Last data filed at 06/15/17 0543  Gross per 24 hour  Intake             1223 ml  Output              775 ml  Net              448 ml   Filed Weights   06/14/17 0000 06/15/17 0421 06/15/17 0423  Weight: 83.3 kg (183 lb 10.3 oz) 82.8 kg (182 lb 8.7 oz) 82.8 kg (182 lb 8.7 oz)    Examination:  General exam: Appears calm and comfortable  Respiratory system: Diminished right  base, clear otherwise, no rhonchi or wheeze, Respiratory effort normal. Cardiovascular system: S1 & S2 heard, RRR. No JVD, murmurs, rubs, gallops or clicks. No pedal edema. Gastrointestinal system: Abdomen is nondistended, soft and nontender. Normal bowel sounds heard. Central nervous system: Alert and oriented. No focal neurological deficits. Extremities: Symmetric 5 x 5 power. Skin: No rashes, lesions or ulcers, jaundiced   Data Reviewed: I have personally reviewed following labs and imaging studies  CBC:  Recent Labs Lab 06/13/17 1856 06/14/17 0750 06/15/17 0259  WBC 9.7 7.8 8.4  NEUTROABS 5.7 4.5 5.0  HGB 5.7* 8.3* 7.7*  HCT 17.2* 24.0* 22.2*  MCV 122.9* 104.8* 106.7*  PLT 119* 93* 90*   Basic Metabolic Panel:  Recent Labs Lab 06/13/17 1856 06/14/17 0750 06/15/17 0259  NA 135 137 137  K 3.0* 3.4* 3.9  CL 95* 98* 102  CO2 29 28 27   GLUCOSE 144* 105* 95  BUN 22* 21* 19  CREATININE 1.76* 1.54* 1.27*  CALCIUM 8.5* 8.7* 8.4*  MG 1.5*  --   --    GFR: Estimated Creatinine Clearance: 63.8 mL/min (A) (by C-G formula based on SCr of 1.27 mg/dL (H)). Liver Function Tests:  Recent Labs Lab 06/13/17 1856 06/14/17 0750 06/15/17 0259  AST 56* 52* 48*  ALT 26 27 25   ALKPHOS 106 104 91  BILITOT 8.7* 9.9* 8.9*  PROT 5.8* 5.6* 5.4*  ALBUMIN 2.4* 2.3* 2.2*   No results for input(s): LIPASE, AMYLASE in the last 168 hours.  Recent Labs Lab 06/14/17 0750  AMMONIA 89*   Coagulation Profile:  Recent Labs Lab 06/13/17 1856 06/14/17 0750 06/14/17 1812 06/15/17 0259  INR 2.63 2.61 2.12 2.19   Cardiac Enzymes: No results for input(s): CKTOTAL, CKMB, CKMBINDEX, TROPONINI in the last 168 hours. BNP (last 3 results) No results for input(s): PROBNP in the last 8760 hours. HbA1C: No results for input(s): HGBA1C in the last 72 hours. CBG:  Recent Labs Lab 06/14/17 1157 06/14/17 1550 06/14/17 1950 06/14/17 2323 06/15/17 0340  GLUCAP 99 92 125* 120* 86    Lipid Profile: No results for input(s): CHOL, HDL, LDLCALC, TRIG, CHOLHDL, LDLDIRECT in the last 72 hours. Thyroid Function Tests: No results for input(s): TSH, T4TOTAL, FREET4, T3FREE, THYROIDAB in the last 72 hours. Anemia Panel: No results for input(s): VITAMINB12, FOLATE, FERRITIN, TIBC, IRON, RETICCTPCT in the last 72 hours. Sepsis Labs: No results for input(s): PROCALCITON, LATICACIDVEN in the last 168 hours.  Recent Results (from the past 240 hour(s))  MRSA PCR Screening  Status: None   Collection Time: 06/13/17 11:50 PM  Result Value Ref Range Status   MRSA by PCR NEGATIVE NEGATIVE Final    Comment:        The GeneXpert MRSA Assay (FDA approved for NASAL specimens only), is one component of a comprehensive MRSA colonization surveillance program. It is not intended to diagnose MRSA infection nor to guide or monitor treatment for MRSA infections.   Gram stain     Status: None   Collection Time: 06/14/17  3:29 PM  Result Value Ref Range Status   Specimen Description FLUID RIGHT PLEURAL  Final   Special Requests NONE  Final   Gram Stain   Final    FEW WBC PRESENT, PREDOMINANTLY MONONUCLEAR NO ORGANISMS SEEN Performed at [facility], [address], [place], [place] [address]    Report Status 06/14/2017 FINAL  Final       Radiology Studies: Dg Chest 2 View  Result Date: 06/13/2017 CLINICAL DATA:  Cough and shortness of breath for 1 month. Weakness. EXAM: CHEST  2 VIEW COMPARISON:  Most recent comparison radiograph 06/01/2017. Chest CT 11/24/2016 FINDINGS: Progressive large right pleural effusion now occupying the lower 2/3 of the right hemithorax. Associated atelectasis without leftward mediastinal shift. Unchanged mediastinal contours, right heart borders obscured by pleural fluid. No pulmonary edema. No pneumothorax. Stable osseous structures. IMPRESSION: Increased right pleural effusion, now large occupying the lower 2/3 of the right hemithorax. Likely  associated atelectasis given lack of leftward mediastinal shift. Electronically Signed   By: Jeb Levering M.D.   On: 06/13/2017 18:31   Dg Chest Port 1 View  Result Date: 06/14/2017 CLINICAL DATA:  Status post right-sided thoracentesis EXAM: PORTABLE CHEST 1 VIEW COMPARISON:  Chest x-ray of June 13, 2017 FINDINGS: There has been marked reduction in the volume of pleural fluid on the right. The right lung there is better inflated. There is no postprocedure pneumothorax. The left lung is adequately inflated. There is prominence of the pulmonary vascularity bilaterally. The heart is top-normal in size. The mediastinum is normal in width. The bony thorax exhibits no acute abnormality. IMPRESSION: Improved aeration of the right lung status post thoracentesis. No postprocedure complication. Electronically Signed   By: David  Martinique M.D.   On: 06/14/2017 15:49   US Thoracentesis Asp Pleural Space W/img Guide  Result Date: 06/15/2017 INDICATION: Patient with history of alcoholic cirrhosis, ascites, recurrent right pleural effusion, dyspnea. Request made for diagnostic and therapeutic right thoracentesis. EXAM: ULTRASOUND GUIDED DIAGNOSTIC AND THERAPEUTIC RIGHT THORACENTESIS MEDICATIONS: None. COMPLICATIONS: None immediate. PROCEDURE: An ultrasound guided thoracentesis was thoroughly discussed with the patient and questions answered. The benefits, risks, alternatives and complications were also discussed. The patient understands and wishes to proceed with the procedure. Written consent was obtained. Ultrasound was performed to localize and [place] an adequate pocket of fluid in the right chest. The area was then prepped and draped in the normal sterile fashion. 1% Lidocaine was used for local anesthesia. Under ultrasound guidance a Safe-T-Centesis catheter was introduced. Thoracentesis was performed. The catheter was removed and a dressing applied. FINDINGS: A total of approximately 1.9 liters of blood-tinged, amber  fluid was removed. Samples were sent to the laboratory as requested by the clinical team. IMPRESSION: Successful ultrasound guided diagnostic and therapeutic right thoracentesis yielding 1.9 liters of pleural fluid. Read by: Rowe Robert, PA-C Electronically Signed   By: Jacqulynn Cadet M.D.   On: 06/14/2017 16:00      Scheduled Meds: . folic acid  1  mg Oral QHS  . lactulose  20 g Oral BID  . [START ON 06/17/2017] pantoprazole  40 mg Intravenous Q12H  . sodium chloride flush  3 mL Intravenous Q12H   Continuous Infusions: . sodium chloride    . cefTRIAXone (ROCEPHIN)  IV Stopped (06/14/17 1709)  . pantoprozole (PROTONIX) infusion 8 mg/hr (06/15/17 0543)     LOS: 2 days    Time spent: 30 minutes   Dessa Phi, DO [facility] [email or web address] Password [email or web address] 06/15/2017, 10:09 AM

## 2017-06-16 LAB — CBC WITH DIFFERENTIAL/PLATELET
BASOS PCT: 1 %
Basophils Absolute: 0.1 10*3/uL (ref 0.0–0.1)
EOS ABS: 0.2 10*3/uL (ref 0.0–0.7)
EOS PCT: 3 %
HCT: 21.8 % — ABNORMAL LOW (ref 39.0–52.0)
Hemoglobin: 7.4 g/dL — ABNORMAL LOW (ref 13.0–17.0)
LYMPHS ABS: 1.1 10*3/uL (ref 0.7–4.0)
Lymphocytes Relative: 16 %
MCH: 35.9 pg — AB (ref 26.0–34.0)
MCHC: 33.9 g/dL (ref 30.0–36.0)
MCV: 105.8 fL — AB (ref 78.0–100.0)
MONO ABS: 1.8 10*3/uL — AB (ref 0.1–1.0)
Monocytes Relative: 26 %
NEUTROS PCT: 54 %
Neutro Abs: 3.7 10*3/uL (ref 1.7–7.7)
PLATELETS: 80 10*3/uL — AB (ref 150–400)
RBC: 2.06 MIL/uL — AB (ref 4.22–5.81)
WBC: 6.9 10*3/uL (ref 4.0–10.5)

## 2017-06-16 LAB — CBC
HCT: 30.2 % — ABNORMAL LOW (ref 39.0–52.0)
HEMOGLOBIN: 10.6 g/dL — AB (ref 13.0–17.0)
MCH: 34.1 pg — ABNORMAL HIGH (ref 26.0–34.0)
MCHC: 35.1 g/dL (ref 30.0–36.0)
MCV: 97.1 fL (ref 78.0–100.0)
Platelets: 75 10*3/uL — ABNORMAL LOW (ref 150–400)
RBC: 3.11 MIL/uL — AB (ref 4.22–5.81)
RDW: 27.6 % — ABNORMAL HIGH (ref 11.5–15.5)
WBC: 7.2 10*3/uL (ref 4.0–10.5)

## 2017-06-16 LAB — COMPREHENSIVE METABOLIC PANEL
ALT: 24 U/L (ref 17–63)
ANION GAP: 7 (ref 5–15)
AST: 48 U/L — ABNORMAL HIGH (ref 15–41)
Albumin: 2.1 g/dL — ABNORMAL LOW (ref 3.5–5.0)
Alkaline Phosphatase: 101 U/L (ref 38–126)
BUN: 14 mg/dL (ref 6–20)
CHLORIDE: 103 mmol/L (ref 101–111)
CO2: 26 mmol/L (ref 22–32)
CREATININE: 1.11 mg/dL (ref 0.61–1.24)
Calcium: 8.3 mg/dL — ABNORMAL LOW (ref 8.9–10.3)
Glucose, Bld: 136 mg/dL — ABNORMAL HIGH (ref 65–99)
POTASSIUM: 3.6 mmol/L (ref 3.5–5.1)
SODIUM: 136 mmol/L (ref 135–145)
Total Bilirubin: 7.1 mg/dL — ABNORMAL HIGH (ref 0.3–1.2)
Total Protein: 5.1 g/dL — ABNORMAL LOW (ref 6.5–8.1)

## 2017-06-16 LAB — PREPARE RBC (CROSSMATCH)

## 2017-06-16 MED ORDER — CIPROFLOXACIN HCL 500 MG PO TABS: 500.0000 mg | ORAL_TABLET | Freq: Every evening | ORAL | 0 refills | Status: DC

## 2017-06-16 MED ORDER — DEXTROSE 5 % IV SOLN
2.0000 g | INTRAVENOUS | Status: DC
  Administered 2017-06-16: 2 g via INTRAVENOUS
  Filled 2017-06-16: qty 2

## 2017-06-16 MED ORDER — POTASSIUM CHLORIDE CRYS ER 10 MEQ PO TBCR: 10.0000 meq | EXTENDED_RELEASE_TABLET | Freq: Every day | ORAL | 0 refills | Status: DC

## 2017-06-16 MED ORDER — FOLIC ACID 1 MG PO TABS: 1.0000 mg | ORAL_TABLET | Freq: Every day | ORAL | 0 refills | Status: DC

## 2017-06-16 MED ORDER — FUROSEMIDE 10 MG/ML IJ SOLN
20.0000 mg | Freq: Once | INTRAMUSCULAR | Status: AC
  Administered 2017-06-16: 20 mg via INTRAVENOUS
  Filled 2017-06-16: qty 2

## 2017-06-16 MED ORDER — SODIUM CHLORIDE 0.9 % IV SOLN
Freq: Once | INTRAVENOUS | Status: AC
  Administered 2017-06-16: 09:00:00 via INTRAVENOUS

## 2017-06-16 NOTE — Progress Notes (Signed)
Patient to be discharged home after post transfusion labs are drawn.  All discharge medications and instructions reviewed and questions answered.

## 2017-06-16 NOTE — Progress Notes (Signed)
Cross cover [facility] Subjective: Thomas [name] a 56 year old white male, with a PMH significant foralcoholic liver cirrhosis, PHG, ascites, BP, esophageal varices with chronic thrombocytopenia, came to the ED with generalized weakness and shortness of breath, black tarry stools. He underwent therapeutic paracentesis on 7/2 with removal of 3.5L to see if it would help with his breathing. In the ED, work up revealed increased right pleural effusion, anemia with Hgb 5.7, INR 2.63. He was given 2u pRBC, 5mg  Vit K and admitted to stepdown unit. He had a paracentesis done on 07/05-1.9 litres was removed. Patient seems alert and interactive today. Anxious to go home. Getting another 2 units of PRBC's prior to discharge.   Objective: Vital signs in last 24 hours: Temp:  [98.6 F (37 C)-99 F (37.2 C)] 98.7 F (37.1 C) (07/07 1138) Pulse Rate:  [89-97] 91 (07/07 1138) Resp:  [18-20] 18 (07/07 1138) BP: (117-123)/(70-86) 120/86 (07/07 1138) SpO2:  [98 %-100 %] 100 % (07/07 1138) Last BM Date: 06/15/17  Intake/Output from previous day: 07/06 0701 - 07/07 0700 In: 840 [P.O.:840] Out: -  Intake/Output this shift: No intake/output data recorded.  General appearance: jaundiced, alert, cooperative, appears stated age, fatigued, no distress and moderately obese Resp: clear to auscultation bilaterally; decreased breath sounds at the right base Cardio: regular rate and rhythm, S1, S2 normal, no murmur, click, rub or gallop GI: soft, non-tender; bowel sounds normal; no masses,  no organomegaly  Lab Results:  Recent Labs  06/14/17 0750 06/15/17 0259 06/16/17 0431  WBC 7.8 8.4 6.9  HGB 8.3* 7.7* 7.4*  HCT 24.0* 22.2* 21.8*  PLT 93* 90* 80*   BMET  Recent Labs  06/14/17 0750 06/15/17 0259 06/16/17 0431  NA 137 137 136  K 3.4* 3.9 3.6  CL 98* 102 103  CO2 28 27 26   GLUCOSE 105* 95 136*  BUN 21* 19 14  CREATININE 1.54* 1.27* 1.11  CALCIUM 8.7* 8.4* 8.3*   LFT  Recent Labs   06/16/17 0431  PROT 5.1*  ALBUMIN 2.1*  AST 48*  ALT 24  ALKPHOS 101  BILITOT 7.1*   PT/INR  Recent Labs  06/14/17 1812 06/15/17 0259  LABPROT 24.1* 24.8*  INR 2.12 2.19   Studies/Results: Dg Chest Port 1 View  Result Date: 06/14/2017 CLINICAL DATA:  Status post right-sided thoracentesis EXAM: PORTABLE CHEST 1 VIEW COMPARISON:  Chest x-ray of June 13, 2017 FINDINGS: There has been marked reduction in the volume of pleural fluid on the right. The right lung there is better inflated. There is no postprocedure pneumothorax. The left lung is adequately inflated. There is prominence of the pulmonary vascularity bilaterally. The heart is top-normal in size. The mediastinum is normal in width. The bony thorax exhibits no acute abnormality. IMPRESSION: Improved aeration of the right lung status post thoracentesis. No postprocedure complication. Electronically Signed   By: David  Martinique M.D.   On: 06/14/2017 15:49   US Thoracentesis Asp Pleural Space W/img Guide  Result Date: 06/15/2017 INDICATION: Patient with history of alcoholic cirrhosis, ascites, recurrent right pleural effusion, dyspnea. Request made for diagnostic and therapeutic right thoracentesis. EXAM: ULTRASOUND GUIDED DIAGNOSTIC AND THERAPEUTIC RIGHT THORACENTESIS MEDICATIONS: None. COMPLICATIONS: None immediate. PROCEDURE: An ultrasound guided thoracentesis was thoroughly discussed with the patient and questions answered. The benefits, risks, alternatives and complications were also discussed. The patient understands and wishes to proceed with the procedure. Written consent was obtained. Ultrasound was performed to localize and Tyr an adequate pocket of fluid in the right chest.  The area was then prepped and draped in the normal sterile fashion. 1% Lidocaine was used for local anesthesia. Under ultrasound guidance a Safe-T-Centesis catheter was introduced. Thoracentesis was performed. The catheter was removed and a dressing applied.  FINDINGS: A total of approximately 1.9 liters of blood-tinged, amber fluid was removed. Samples were sent to the laboratory as requested by the clinical team. IMPRESSION: Successful ultrasound guided diagnostic and therapeutic right thoracentesis yielding 1.9 liters of pleural fluid. Read by: Rowe Robert, PA-C Electronically Signed   By: Jacqulynn Cadet M.D.   On: 06/14/2017 16:00    Medications: I have reviewed the patient's current medications.  Assessment/Plan: 1) Decompensated alcoholic liver disease with PHG, ascites, thrombocytopenia and samll esophageal varices, right pleural effusion-to be admitted to [facility] on Monday 06/18/17 for evaluation for OLT. 2) AKI-creatinine improving..  LOS: 3 days   Daschel Roughton 06/16/2017, 2:03 PM

## 2017-06-16 NOTE — Progress Notes (Signed)
Pt discharged home after lab results available and stable. Both PIVs d/c'd. Pt accompanied by wife and NT downstairs via w/c. Hortencia Conradi RN

## 2017-06-16 NOTE — Discharge Summary (Signed)
Physician Discharge Summary  Thomas [name] WUJ:[id number] DOB: [birth year] DOA: 06/13/2017  PCP: Alroy Dust, L.Marlou Sa, MD  Admit date: 06/13/2017 Discharge date: 06/16/2017  Admitted From: Home Disposition: Home  Recommendations for Outpatient Follow-up:  1. Follow up with [facility] as scheduled on Monday 7/9   Discharge Condition: Stable CODE STATUS: DNR  Diet recommendation: Heart healthy   Brief/Interim Summary: Thomas [name] a 56 y.o.malewith medical history significant foralcoholic liver cirrhosis with resistant ascites, esophageal varices, history of SBP, chronic macrocytic anemia, and chronic thrombocytopenia, now presenting to the emergency department for evaluation of generalized weakness and shortness of breath, black tarry stools. He underwent therapeutic paracentesis on 7/2 with removal of 3.5L to see if it would help with his breathing. In the ED, work up revealed increased right pleural effusion, anemia with Hgb 5.7, INR 2.63. He was given 2u pRBC, 5mg  Vit K and admitted to stepdown unit. GI was consulted.   Symptomatic anemia, melena  - Pt presented with SOB, fatigue, black stools. Found to have Hgb 5.7, with DRE revealing black sticky stool with FOBT+. Has known varices.  - 2u pRBC transfused 7/5, and again 2u pRBC 7/7  - GI following, have been in contact with [facility] RN  - Rocephin for SBP ppx --> continue cipro at discharge   Right pleural effusion, large  - Pt has known right pleural effusion followed by pulmonology - S/p thoracentesis 7/5 with 1.9L removal, improvement in effusion, fluid transudative   - Breathing much improved   Decompensated alcoholic cirrhosis  - Following at [facility] center and with [facility]  - Has diuretic-resistant ascites; had therapeutic paracentesis on 06/11/17 with 3.5 L off  - MELD score 32 on admission, corresponding to estimated 53% 32-month mortality  - Has known varices, not on beta-blocker; hx of SBP on  prophylactic Cipro  - Aldactone and Lasix held due to AKI on admission, resume at discharge  - Continue Lactulose for BM > 3x daily - Has follow up with transplant center on Monday 7/9   Acute kidney injury - SCr is 1.74 on admission, up from apparent baseline of 1.2  - Likely prerenal in setting of suspected GIB  - Back to baseline with IVF support    Discharge Instructions  Discharge Instructions    Call MD for:  difficulty breathing, headache or visual disturbances    Complete by:  As directed    Call MD for:  extreme fatigue    Complete by:  As directed    Call MD for:  hives    Complete by:  As directed    Call MD for:  persistant dizziness or light-headedness    Complete by:  As directed    Call MD for:  persistant nausea and vomiting    Complete by:  As directed    Call MD for:  severe uncontrolled pain    Complete by:  As directed    Call MD for:  temperature >100.4    Complete by:  As directed    Diet - low sodium heart healthy    Complete by:  As directed    Discharge instructions    Complete by:  As directed    You were cared for by a hospitalist during your hospital stay. If you have any questions about your discharge medications or the care you received while you were in the hospital after you are discharged, you can call the unit and asked to speak with the hospitalist on call  if the hospitalist that took care of you is not available. Once you are discharged, your primary care physician will handle any further medical issues. Please note that NO REFILLS for any discharge medications will be authorized once you are discharged, as it is imperative that you return to your primary care physician (or establish a relationship with a primary care physician if you do not have one) for your aftercare needs so that they can reassess your need for medications and monitor your lab values.   Increase activity slowly    Complete by:  As directed      Allergies as of 06/16/2017       Reactions   Rifaximin Swelling, Other (See Comments)   Reaction:  All over body swelling       Medication List    STOP taking these medications   aspirin-sod bicarb-citric acid 325 MG Tbef tablet Commonly known as:  ALKA-SELTZER     TAKE these medications   ciprofloxacin 500 MG tablet Commonly known as:  CIPRO Take 1 tablet (500 mg total) by mouth every evening.   ferrous sulfate 325 (65 FE) MG tablet Take 1 tablet (325 mg total) by mouth 3 (three) times daily with meals. What changed:  when to take this   folic acid 1 MG tablet Commonly known as:  FOLVITE Take 1 tablet (1 mg total) by mouth at bedtime.   furosemide 40 MG tablet Commonly known as:  LASIX Take 1.5 tablets (60 mg total) by mouth daily.   lactulose 10 GM/15ML solution Commonly known as:  CHRONULAC Take 30 mLs (20 g total) by mouth 2 (two) times daily.   pantoprazole 40 MG tablet Commonly known as:  PROTONIX TAKE 1 TABLET (40 MG TOTAL) BY MOUTH 2 (TWO) TIMES DAILY.   potassium chloride 10 MEQ tablet Commonly known as:  K-DUR,KLOR-CON Take 1 tablet (10 mEq total) by mouth daily.   spironolactone 100 MG tablet Commonly known as:  ALDACTONE Take 1 tablet (100 mg total) by mouth daily.      Follow-up Information    Alroy Dust, L.Marlou Sa, MD. Schedule an appointment as soon as possible for a visit in 1 week(s).   Specialty:  Family Medicine Contact information: [address] [place] [address] [phone]        [facility]. Go on 06/18/2017.          Allergies  Allergen Reactions  . Rifaximin Swelling and Other (See Comments)    Reaction:  All over body swelling     Consultations:  GI   Procedures/Studies: Dg Chest 2 View  Result Date: 06/13/2017 CLINICAL DATA:  Cough and shortness of breath for 1 month. Weakness. EXAM: CHEST  2 VIEW COMPARISON:  Most recent comparison radiograph 06/01/2017. Chest CT 11/24/2016 FINDINGS: Progressive large right pleural  effusion now occupying the lower 2/3 of the right hemithorax. Associated atelectasis without leftward mediastinal shift. Unchanged mediastinal contours, right heart borders obscured by pleural fluid. No pulmonary edema. No pneumothorax. Stable osseous structures. IMPRESSION: Increased right pleural effusion, now large occupying the lower 2/3 of the right hemithorax. Likely associated atelectasis given lack of leftward mediastinal shift. Electronically Signed   By: Jeb Levering M.D.   On: 06/13/2017 18:31   Dg Chest 2 View  Result Date: 06/01/2017 CLINICAL DATA:  Cough EXAM: CHEST  2 VIEW COMPARISON:  04/27/2017 chest radiograph. FINDINGS: Stable cardiomediastinal silhouette with normal heart size. No pneumothorax. Moderate right pleural effusion, increased. No left pleural effusion. Worsening  volume loss and consolidation at the right lung base. IMPRESSION: 1. Moderate right pleural effusion, increased. 2. Worsening volume loss and consolidation at the right lung base, likely representing atelectasis, with a component of pneumonia or underlying mass difficult to exclude. Chest CT with IV contrast may be considered as clinically warranted. Electronically Signed   By: Ilona Sorrel M.D.   On: 06/01/2017 14:53   Dg Chest Port 1 View  Result Date: 06/14/2017 CLINICAL DATA:  Status post right-sided thoracentesis EXAM: PORTABLE CHEST 1 VIEW COMPARISON:  Chest x-ray of June 13, 2017 FINDINGS: There has been marked reduction in the volume of pleural fluid on the right. The right lung there is better inflated. There is no postprocedure pneumothorax. The left lung is adequately inflated. There is prominence of the pulmonary vascularity bilaterally. The heart is top-normal in size. The mediastinum is normal in width. The bony thorax exhibits no acute abnormality. IMPRESSION: Improved aeration of the right lung status post thoracentesis. No postprocedure complication. Electronically Signed   By: David  Martinique M.D.    On: 06/14/2017 15:49   Ir Paracentesis  Result Date: 06/11/2017 INDICATION: Patient with recurrent ascites. Request is made for therapeutic paracentesis. EXAM: ULTRASOUND GUIDED THERAPEUTIC PARACENTESIS MEDICATIONS: 10 mL 1% lidocaine COMPLICATIONS: None immediate. PROCEDURE: Informed written consent was obtained from the patient after a discussion of the risks, benefits and alternatives to treatment. A timeout was performed prior to the initiation of the procedure. Initial ultrasound scanning demonstrates a large amount of ascites within the left lateral abdomen. The left lateral abdomen was prepped and draped in the usual sterile fashion. 1% lidocaine was used for local anesthesia. Following this, a 19 gauge, 7-cm, Yueh catheter was introduced. An ultrasound image was saved for documentation purposes. The paracentesis was performed. The catheter was removed and a dressing was applied. The patient tolerated the procedure well without immediate post procedural complication. FINDINGS: A total of approximately 3.5 liters of yellow fluid was removed. IMPRESSION: Successful ultrasound-guided therapeutic paracentesis yielding 3.5 liters of peritoneal fluid. Read by:  Brynda Greathouse PA-C Electronically Signed   By: Lucrezia Europe M.D.   On: 06/11/2017 15:40   US Thoracentesis Asp Pleural Space W/img Guide  Result Date: 06/15/2017 INDICATION: Patient with history of alcoholic cirrhosis, ascites, recurrent right pleural effusion, dyspnea. Request made for diagnostic and therapeutic right thoracentesis. EXAM: ULTRASOUND GUIDED DIAGNOSTIC AND THERAPEUTIC RIGHT THORACENTESIS MEDICATIONS: None. COMPLICATIONS: None immediate. PROCEDURE: An ultrasound guided thoracentesis was thoroughly discussed with the patient and questions answered. The benefits, risks, alternatives and complications were also discussed. The patient understands and wishes to proceed with the procedure. Written consent was obtained. Ultrasound was performed  to localize and Tuck an adequate pocket of fluid in the right chest. The area was then prepped and draped in the normal sterile fashion. 1% Lidocaine was used for local anesthesia. Under ultrasound guidance a Safe-T-Centesis catheter was introduced. Thoracentesis was performed. The catheter was removed and a dressing applied. FINDINGS: A total of approximately 1.9 liters of blood-tinged, amber fluid was removed. Samples were sent to the laboratory as requested by the clinical team. IMPRESSION: Successful ultrasound guided diagnostic and therapeutic right thoracentesis yielding 1.9 liters of pleural fluid. Read by: Rowe Robert, PA-C Electronically Signed   By: Jacqulynn Cadet M.D.   On: 06/14/2017 16:00      Discharge Exam: Vitals:   06/16/17 1443 06/16/17 1458  BP: 130/72 135/76  Pulse: 90 91  Resp: 16 16  Temp: 98.2 F (36.8 C) 98.5 F (36.9  C)   Vitals:   06/16/17 1138 06/16/17 1422 06/16/17 1443 06/16/17 1458  BP: 120/86 130/72 130/72 135/76  Pulse: 91 90 90 91  Resp: 18 16 16 16   Temp: 98.7 F (37.1 C) 98.2 F (36.8 C) 98.2 F (36.8 C) 98.5 F (36.9 C)  TempSrc: Oral Oral Oral Oral  SpO2: 100% 97% 98% 98%  Weight:      Height:        General: Pt is alert, awake, not in acute distress, +jaundiced  Cardiovascular: RRR, S1/S2 +, no rubs, no gallops Respiratory: CTA bilaterally, no wheezing, no rhonchi Abdominal: Soft, Mildly distended but not tender to palpation, bowel sounds + Extremities: no edema, no cyanosis    The results of significant diagnostics from this hospitalization (including imaging, microbiology, ancillary and laboratory) are listed below for reference.     Microbiology: Recent Results (from the past 240 hour(s))  MRSA PCR Screening     Status: None   Collection Time: 06/13/17 11:50 PM  Result Value Ref Range Status   MRSA by PCR NEGATIVE NEGATIVE Final    Comment:        The GeneXpert MRSA Assay (FDA approved for NASAL specimens only), is one  component of a comprehensive MRSA colonization surveillance program. It is not intended to diagnose MRSA infection nor to guide or monitor treatment for MRSA infections.   Culture, body fluid-bottle     Status: None (Preliminary result)   Collection Time: 06/14/17  3:29 PM  Result Value Ref Range Status   Specimen Description FLUID RIGHT PLEURAL  Final   Special Requests BOTTLES DRAWN AEROBIC AND ANAEROBIC 10CC  Final   Culture   Final    NO GROWTH 2 DAYS Performed at [facility], [place] [address], [place], [place] [address]    Report Status PENDING  Incomplete  Gram stain     Status: None   Collection Time: 06/14/17  3:29 PM  Result Value Ref Range Status   Specimen Description FLUID RIGHT PLEURAL  Final   Special Requests NONE  Final   Gram Stain   Final    FEW WBC PRESENT, PREDOMINANTLY MONONUCLEAR NO ORGANISMS SEEN Performed at [facility], [place] [address], [place],  [address]    Report Status 06/14/2017 FINAL  Final     Labs: BNP (last 3 results)  Recent Labs  03/27/17 2051 06/13/17 1856  BNP 106.7* 01.7   Basic Metabolic Panel:  Recent Labs Lab 06/13/17 1856 06/14/17 0750 06/15/17 0259 06/16/17 0431  NA 135 137 137 136  K 3.0* 3.4* 3.9 3.6  CL 95* 98* 102 103  CO2 29 28 27 26   GLUCOSE 144* 105* 95 136*  BUN 22* 21* 19 14  CREATININE 1.76* 1.54* 1.27* 1.11  CALCIUM 8.5* 8.7* 8.4* 8.3*  MG 1.5*  --   --   --    Liver Function Tests:  Recent Labs Lab 06/13/17 1856 06/14/17 0750 06/15/17 0259 06/16/17 0431  AST 56* 52* 48* 48*  ALT 26 27 25 24   ALKPHOS 106 104 91 101  BILITOT 8.7* 9.9* 8.9* 7.1*  PROT 5.8* 5.6* 5.4* 5.1*  ALBUMIN 2.4* 2.3* 2.2* 2.1*   No results for input(s): LIPASE, AMYLASE in the last 168 hours.  Recent Labs Lab 06/14/17 0750  AMMONIA 89*   CBC:  Recent Labs Lab 06/13/17 1856 06/14/17 0750 06/15/17 0259 06/16/17 0431  WBC 9.7 7.8 8.4 6.9  NEUTROABS 5.7 4.5 5.0 3.7  HGB 5.7* 8.3* 7.7*  7.4*   HCT 17.2* 24.0* 22.2* 21.8*  MCV 122.9* 104.8* 106.7* 105.8*  PLT 119* 93* 90* 80*   Cardiac Enzymes: No results for input(s): CKTOTAL, CKMB, CKMBINDEX, TROPONINI in the last 168 hours. BNP: Invalid input(s): POCBNP CBG:  Recent Labs Lab 06/14/17 1157 06/14/17 1550 06/14/17 1950 06/14/17 2323 06/15/17 0340  GLUCAP 99 92 125* 120* 86   D-Dimer No results for input(s): DDIMER in the last 72 hours. Hgb A1c  Recent Labs  06/14/17 0750  HGBA1C <4.2*   Lipid Profile No results for input(s): CHOL, HDL, LDLCALC, TRIG, CHOLHDL, LDLDIRECT in the last 72 hours. Thyroid function studies No results for input(s): TSH, T4TOTAL, T3FREE, THYROIDAB in the last 72 hours.  Invalid input(s): FREET3 Anemia work up No results for input(s): VITAMINB12, FOLATE, FERRITIN, TIBC, IRON, RETICCTPCT in the last 72 hours. Urinalysis    Component Value Date/Time   COLORURINE AMBER (A) 04/30/2017 0759   APPEARANCEUR CLEAR 04/30/2017 0759   LABSPEC 1.023 04/30/2017 0759   PHURINE 6.0 04/30/2017 0759   GLUCOSEU NEGATIVE 04/30/2017 0759   HGBUR NEGATIVE 04/30/2017 0759   BILIRUBINUR NEGATIVE 04/30/2017 0759   KETONESUR NEGATIVE 04/30/2017 0759   PROTEINUR NEGATIVE 04/30/2017 0759   UROBILINOGEN 0.2 11/22/2012 0856   NITRITE NEGATIVE 04/30/2017 0759   LEUKOCYTESUR NEGATIVE 04/30/2017 0759   Sepsis Labs Invalid input(s): PROCALCITONIN,  WBC,  LACTICIDVEN Microbiology Recent Results (from the past 240 hour(s))  MRSA PCR Screening     Status: None   Collection Time: 06/13/17 11:50 PM  Result Value Ref Range Status   MRSA by PCR NEGATIVE NEGATIVE Final    Comment:        The GeneXpert MRSA Assay (FDA approved for NASAL specimens only), is one component of a comprehensive MRSA colonization surveillance program. It is not intended to diagnose MRSA infection nor to guide or monitor treatment for MRSA infections.   Culture, body fluid-bottle     Status: None (Preliminary result)    Collection Time: 06/14/17  3:29 PM  Result Value Ref Range Status   Specimen Description FLUID RIGHT PLEURAL  Final   Special Requests BOTTLES DRAWN AEROBIC AND ANAEROBIC 10CC  Final   Culture   Final    NO GROWTH 2 DAYS Performed at [facility], [place] [address] [place], [place] [address]    Report Status PENDING  Incomplete  Gram stain     Status: None   Collection Time: 06/14/17  3:29 PM  Result Value Ref Range Status   Specimen Description FLUID RIGHT PLEURAL  Final   Special Requests NONE  Final   Gram Stain   Final    FEW WBC PRESENT, PREDOMINANTLY MONONUCLEAR NO ORGANISMS SEEN Performed at [facility], [place] [address], [place], [place] [address]    Report Status 06/14/2017 FINAL  Final     Time coordinating discharge: 40 minutes  SIGNED:  Dessa Phi, DO [facility] Pager [phone]  If 7PM-7AM, please contact night-coverage [email or web address] Password [facility] 06/16/2017, 3:45 PM

## 2017-06-18 DIAGNOSIS — K7290 Hepatic failure, unspecified without coma: Secondary | ICD-10-CM | POA: Diagnosis not present

## 2017-06-18 DIAGNOSIS — D696 Thrombocytopenia, unspecified: Secondary | ICD-10-CM | POA: Diagnosis not present

## 2017-06-18 DIAGNOSIS — K766 Portal hypertension: Secondary | ICD-10-CM | POA: Diagnosis not present

## 2017-06-18 DIAGNOSIS — I864 Gastric varices: Secondary | ICD-10-CM | POA: Diagnosis not present

## 2017-06-18 DIAGNOSIS — K3189 Other diseases of stomach and duodenum: Secondary | ICD-10-CM | POA: Diagnosis not present

## 2017-06-18 DIAGNOSIS — D689 Coagulation defect, unspecified: Secondary | ICD-10-CM | POA: Diagnosis not present

## 2017-06-18 DIAGNOSIS — Z9889 Other specified postprocedural states: Secondary | ICD-10-CM | POA: Diagnosis not present

## 2017-06-18 DIAGNOSIS — Z01818 Encounter for other preprocedural examination: Secondary | ICD-10-CM | POA: Diagnosis not present

## 2017-06-18 DIAGNOSIS — I8500 Esophageal varices without bleeding: Secondary | ICD-10-CM | POA: Diagnosis not present

## 2017-06-18 DIAGNOSIS — E669 Obesity, unspecified: Secondary | ICD-10-CM | POA: Diagnosis not present

## 2017-06-18 DIAGNOSIS — K7031 Alcoholic cirrhosis of liver with ascites: Secondary | ICD-10-CM | POA: Diagnosis not present

## 2017-06-18 DIAGNOSIS — E785 Hyperlipidemia, unspecified: Secondary | ICD-10-CM | POA: Diagnosis not present

## 2017-06-18 DIAGNOSIS — Z6834 Body mass index (BMI) 34.0-34.9, adult: Secondary | ICD-10-CM | POA: Diagnosis not present

## 2017-06-18 DIAGNOSIS — M47816 Spondylosis without myelopathy or radiculopathy, lumbar region: Secondary | ICD-10-CM | POA: Diagnosis not present

## 2017-06-18 DIAGNOSIS — I10 Essential (primary) hypertension: Secondary | ICD-10-CM | POA: Diagnosis not present

## 2017-06-18 DIAGNOSIS — M8580 Other specified disorders of bone density and structure, unspecified site: Secondary | ICD-10-CM | POA: Diagnosis not present

## 2017-06-18 DIAGNOSIS — K7460 Unspecified cirrhosis of liver: Secondary | ICD-10-CM | POA: Diagnosis not present

## 2017-06-18 DIAGNOSIS — Z01810 Encounter for preprocedural cardiovascular examination: Secondary | ICD-10-CM | POA: Diagnosis not present

## 2017-06-18 LAB — TYPE AND SCREEN
ABO/RH(D): A POS
Antibody Screen: NEGATIVE
UNIT DIVISION: 0
Unit division: 0
Unit division: 0
Unit division: 0

## 2017-06-18 LAB — BPAM RBC
BLOOD PRODUCT EXPIRATION DATE: 201807172359
BLOOD PRODUCT EXPIRATION DATE: 201807172359
BLOOD PRODUCT EXPIRATION DATE: 201807232359
Blood Product Expiration Date: 201807232359
ISSUE DATE / TIME: 201807050249
ISSUE DATE / TIME: 201807071106
ISSUE DATE / TIME: 201807042216
ISSUE DATE / TIME: 201807071430
UNIT TYPE AND RH: 6200
Unit Type and Rh: 6200
Unit Type and Rh: 6200
Unit Type and Rh: 6200

## 2017-06-19 DIAGNOSIS — D696 Thrombocytopenia, unspecified: Secondary | ICD-10-CM | POA: Diagnosis not present

## 2017-06-19 DIAGNOSIS — D689 Coagulation defect, unspecified: Secondary | ICD-10-CM | POA: Diagnosis not present

## 2017-06-19 DIAGNOSIS — I10 Essential (primary) hypertension: Secondary | ICD-10-CM | POA: Diagnosis not present

## 2017-06-19 DIAGNOSIS — K3189 Other diseases of stomach and duodenum: Secondary | ICD-10-CM | POA: Diagnosis not present

## 2017-06-19 DIAGNOSIS — E785 Hyperlipidemia, unspecified: Secondary | ICD-10-CM | POA: Diagnosis not present

## 2017-06-19 DIAGNOSIS — Z01818 Encounter for other preprocedural examination: Secondary | ICD-10-CM | POA: Diagnosis not present

## 2017-06-19 DIAGNOSIS — K7290 Hepatic failure, unspecified without coma: Secondary | ICD-10-CM | POA: Diagnosis not present

## 2017-06-19 DIAGNOSIS — K766 Portal hypertension: Secondary | ICD-10-CM | POA: Diagnosis not present

## 2017-06-19 DIAGNOSIS — E669 Obesity, unspecified: Secondary | ICD-10-CM | POA: Diagnosis not present

## 2017-06-19 DIAGNOSIS — K7031 Alcoholic cirrhosis of liver with ascites: Secondary | ICD-10-CM | POA: Diagnosis not present

## 2017-06-19 DIAGNOSIS — I8500 Esophageal varices without bleeding: Secondary | ICD-10-CM | POA: Diagnosis not present

## 2017-06-19 DIAGNOSIS — J9811 Atelectasis: Secondary | ICD-10-CM | POA: Diagnosis not present

## 2017-06-19 DIAGNOSIS — J90 Pleural effusion, not elsewhere classified: Secondary | ICD-10-CM | POA: Diagnosis not present

## 2017-06-19 LAB — CULTURE, BODY FLUID W GRAM STAIN -BOTTLE

## 2017-06-19 LAB — CULTURE, BODY FLUID-BOTTLE: CULTURE: NO GROWTH

## 2017-06-20 DIAGNOSIS — K766 Portal hypertension: Secondary | ICD-10-CM | POA: Diagnosis not present

## 2017-06-20 DIAGNOSIS — I10 Essential (primary) hypertension: Secondary | ICD-10-CM | POA: Diagnosis not present

## 2017-06-20 DIAGNOSIS — E785 Hyperlipidemia, unspecified: Secondary | ICD-10-CM | POA: Diagnosis not present

## 2017-06-20 DIAGNOSIS — K3189 Other diseases of stomach and duodenum: Secondary | ICD-10-CM | POA: Diagnosis not present

## 2017-06-20 DIAGNOSIS — Z01818 Encounter for other preprocedural examination: Secondary | ICD-10-CM | POA: Diagnosis not present

## 2017-06-20 DIAGNOSIS — D689 Coagulation defect, unspecified: Secondary | ICD-10-CM | POA: Diagnosis not present

## 2017-06-20 DIAGNOSIS — K7031 Alcoholic cirrhosis of liver with ascites: Secondary | ICD-10-CM | POA: Diagnosis not present

## 2017-06-20 DIAGNOSIS — K7290 Hepatic failure, unspecified without coma: Secondary | ICD-10-CM | POA: Diagnosis not present

## 2017-06-20 DIAGNOSIS — D696 Thrombocytopenia, unspecified: Secondary | ICD-10-CM | POA: Diagnosis not present

## 2017-06-20 DIAGNOSIS — E669 Obesity, unspecified: Secondary | ICD-10-CM | POA: Diagnosis not present

## 2017-06-20 DIAGNOSIS — J90 Pleural effusion, not elsewhere classified: Secondary | ICD-10-CM | POA: Diagnosis not present

## 2017-06-20 DIAGNOSIS — I8500 Esophageal varices without bleeding: Secondary | ICD-10-CM | POA: Diagnosis not present

## 2017-06-25 ENCOUNTER — Other Ambulatory Visit: Payer: Self-pay

## 2017-06-25 ENCOUNTER — Telehealth: Payer: Self-pay | Admitting: Internal Medicine

## 2017-06-25 DIAGNOSIS — K7031 Alcoholic cirrhosis of liver with ascites: Secondary | ICD-10-CM

## 2017-06-25 DIAGNOSIS — R188 Other ascites: Secondary | ICD-10-CM

## 2017-06-25 NOTE — Telephone Encounter (Signed)
[place] for paracentesis up to 6L with IV albumin 50 g if 4 L or less, 75 g if more than 4L Check cell count Send results to his [facility] hepatologist

## 2017-06-25 NOTE — Telephone Encounter (Signed)
Pt reports his abdomen is distended and he is uncomfortable. Clothes are tighter and he reports a 7 pound wt gain. Pt is requesting to have a paracentesis done. Please advise.

## 2017-06-25 NOTE — Telephone Encounter (Signed)
Pt scheduled for [place] para at [facility] per Dr. Vena Rua orders for 06/26/17@1pm , pt to arrive there at 12:45pm. Pt aware of appt.

## 2017-06-26 ENCOUNTER — Encounter (HOSPITAL_COMMUNITY): Payer: Self-pay

## 2017-06-26 ENCOUNTER — Emergency Department (HOSPITAL_COMMUNITY): Payer: BLUE CROSS/BLUE SHIELD

## 2017-06-26 ENCOUNTER — Ambulatory Visit (HOSPITAL_COMMUNITY)
Admission: RE | Admit: 2017-06-26 | Discharge: 2017-06-26 | Disposition: A | Discharge status: Home / Self Care | Payer: BLUE CROSS/BLUE SHIELD | Source: Ambulatory Visit | Attending: Internal Medicine | Admitting: Internal Medicine

## 2017-06-26 ENCOUNTER — Inpatient Hospital Stay (HOSPITAL_COMMUNITY)
Admission: EM | Admit: 2017-06-26 | Discharge: 2017-06-29 | DRG: 812 | Disposition: A | Discharge status: Home / Self Care | Payer: BLUE CROSS/BLUE SHIELD | Attending: Internal Medicine | Admitting: Internal Medicine

## 2017-06-26 DIAGNOSIS — I129 Hypertensive chronic kidney disease with stage 1 through stage 4 chronic kidney disease, or unspecified chronic kidney disease: Secondary | ICD-10-CM | POA: Diagnosis not present

## 2017-06-26 DIAGNOSIS — R710 Precipitous drop in hematocrit: Secondary | ICD-10-CM | POA: Diagnosis not present

## 2017-06-26 DIAGNOSIS — E860 Dehydration: Secondary | ICD-10-CM | POA: Diagnosis not present

## 2017-06-26 DIAGNOSIS — D689 Coagulation defect, unspecified: Secondary | ICD-10-CM | POA: Diagnosis present

## 2017-06-26 DIAGNOSIS — K3189 Other diseases of stomach and duodenum: Secondary | ICD-10-CM | POA: Diagnosis present

## 2017-06-26 DIAGNOSIS — D539 Nutritional anemia, unspecified: Secondary | ICD-10-CM | POA: Diagnosis present

## 2017-06-26 DIAGNOSIS — D649 Anemia, unspecified: Secondary | ICD-10-CM | POA: Diagnosis not present

## 2017-06-26 DIAGNOSIS — R188 Other ascites: Secondary | ICD-10-CM

## 2017-06-26 DIAGNOSIS — D696 Thrombocytopenia, unspecified: Secondary | ICD-10-CM | POA: Diagnosis not present

## 2017-06-26 DIAGNOSIS — Z79899 Other long term (current) drug therapy: Secondary | ICD-10-CM | POA: Diagnosis not present

## 2017-06-26 DIAGNOSIS — R195 Other fecal abnormalities: Secondary | ICD-10-CM | POA: Diagnosis not present

## 2017-06-26 DIAGNOSIS — K7290 Hepatic failure, unspecified without coma: Secondary | ICD-10-CM | POA: Diagnosis present

## 2017-06-26 DIAGNOSIS — K7460 Unspecified cirrhosis of liver: Secondary | ICD-10-CM | POA: Diagnosis not present

## 2017-06-26 DIAGNOSIS — K7031 Alcoholic cirrhosis of liver with ascites: Secondary | ICD-10-CM | POA: Diagnosis present

## 2017-06-26 DIAGNOSIS — K219 Gastro-esophageal reflux disease without esophagitis: Secondary | ICD-10-CM | POA: Diagnosis not present

## 2017-06-26 DIAGNOSIS — R109 Unspecified abdominal pain: Secondary | ICD-10-CM | POA: Diagnosis not present

## 2017-06-26 DIAGNOSIS — D62 Acute posthemorrhagic anemia: Secondary | ICD-10-CM | POA: Diagnosis not present

## 2017-06-26 DIAGNOSIS — I8500 Esophageal varices without bleeding: Secondary | ICD-10-CM | POA: Diagnosis present

## 2017-06-26 DIAGNOSIS — I8510 Secondary esophageal varices without bleeding: Secondary | ICD-10-CM | POA: Diagnosis present

## 2017-06-26 DIAGNOSIS — K922 Gastrointestinal hemorrhage, unspecified: Secondary | ICD-10-CM | POA: Diagnosis not present

## 2017-06-26 DIAGNOSIS — J90 Pleural effusion, not elsewhere classified: Secondary | ICD-10-CM | POA: Diagnosis present

## 2017-06-26 DIAGNOSIS — Z881 Allergy status to other antibiotic agents status: Secondary | ICD-10-CM

## 2017-06-26 DIAGNOSIS — N179 Acute kidney failure, unspecified: Secondary | ICD-10-CM | POA: Diagnosis not present

## 2017-06-26 DIAGNOSIS — Z7682 Awaiting organ transplant status: Secondary | ICD-10-CM | POA: Diagnosis not present

## 2017-06-26 DIAGNOSIS — N189 Chronic kidney disease, unspecified: Secondary | ICD-10-CM | POA: Diagnosis not present

## 2017-06-26 DIAGNOSIS — K7210 Chronic hepatic failure without coma: Secondary | ICD-10-CM | POA: Diagnosis present

## 2017-06-26 DIAGNOSIS — K2970 Gastritis, unspecified, without bleeding: Secondary | ICD-10-CM | POA: Diagnosis present

## 2017-06-26 DIAGNOSIS — I959 Hypotension, unspecified: Secondary | ICD-10-CM | POA: Diagnosis present

## 2017-06-26 DIAGNOSIS — D500 Iron deficiency anemia secondary to blood loss (chronic): Secondary | ICD-10-CM | POA: Diagnosis present

## 2017-06-26 DIAGNOSIS — K769 Liver disease, unspecified: Secondary | ICD-10-CM | POA: Diagnosis not present

## 2017-06-26 DIAGNOSIS — J918 Pleural effusion in other conditions classified elsewhere: Secondary | ICD-10-CM | POA: Diagnosis not present

## 2017-06-26 DIAGNOSIS — K2920 Alcoholic gastritis without bleeding: Secondary | ICD-10-CM | POA: Diagnosis not present

## 2017-06-26 DIAGNOSIS — Z9889 Other specified postprocedural states: Secondary | ICD-10-CM

## 2017-06-26 HISTORY — PX: IR PARACENTESIS: IMG2679

## 2017-06-26 LAB — BODY FLUID CELL COUNT WITH DIFFERENTIAL
LYMPHS FL: 47 %
MONOCYTE-MACROPHAGE-SEROUS FLUID: 51 % (ref 50–90)
NEUTROPHIL FLUID: 2 % (ref 0–25)
WBC FLUID: 19 uL (ref 0–1000)

## 2017-06-26 LAB — COMPREHENSIVE METABOLIC PANEL
ALK PHOS: 91 U/L (ref 38–126)
ALT: 34 U/L (ref 17–63)
ANION GAP: 8 (ref 5–15)
AST: 71 U/L — ABNORMAL HIGH (ref 15–41)
Albumin: 2.2 g/dL — ABNORMAL LOW (ref 3.5–5.0)
BILIRUBIN TOTAL: 9.7 mg/dL — AB (ref 0.3–1.2)
BUN: 24 mg/dL — ABNORMAL HIGH (ref 6–20)
CO2: 26 mmol/L (ref 22–32)
Calcium: 9.4 mg/dL (ref 8.9–10.3)
Chloride: 102 mmol/L (ref 101–111)
Creatinine, Ser: 1.45 mg/dL — ABNORMAL HIGH (ref 0.61–1.24)
GFR, EST NON AFRICAN AMERICAN: 53 mL/min — AB (ref >60)
GLUCOSE: 109 mg/dL — AB (ref 65–99)
POTASSIUM: 4.3 mmol/L (ref 3.5–5.1)
Sodium: 136 mmol/L (ref 135–145)
TOTAL PROTEIN: 6 g/dL — AB (ref 6.5–8.1)

## 2017-06-26 LAB — ABO/RH: ABO/RH(D): A POS

## 2017-06-26 LAB — URINALYSIS, ROUTINE W REFLEX MICROSCOPIC
BILIRUBIN URINE: NEGATIVE
Glucose, UA: NEGATIVE mg/dL
Hgb urine dipstick: NEGATIVE
Ketones, ur: NEGATIVE mg/dL
LEUKOCYTES UA: NEGATIVE
NITRITE: NEGATIVE
Protein, ur: NEGATIVE mg/dL
SPECIFIC GRAVITY, URINE: 1.016 (ref 1.005–1.030)
pH: 5 (ref 5.0–8.0)

## 2017-06-26 LAB — MRSA PCR SCREENING: MRSA BY PCR: NEGATIVE

## 2017-06-26 LAB — AMMONIA: Ammonia: 24 umol/L (ref 9–35)

## 2017-06-26 LAB — POC OCCULT BLOOD, ED: FECAL OCCULT BLD: POSITIVE — AB

## 2017-06-26 LAB — PREPARE RBC (CROSSMATCH)

## 2017-06-26 LAB — CBC
HCT: 20.1 % — ABNORMAL LOW (ref 39.0–52.0)
HEMATOCRIT: 21.3 % — AB (ref 39.0–52.0)
HEMOGLOBIN: 6.9 g/dL — AB (ref 13.0–17.0)
HEMOGLOBIN: 6.5 g/dL — AB (ref 13.0–17.0)
MCH: 32.7 pg (ref 26.0–34.0)
MCH: 32.8 pg (ref 26.0–34.0)
MCHC: 32.4 g/dL (ref 30.0–36.0)
MCHC: 32.3 g/dL (ref 30.0–36.0)
MCV: 100.9 fL — ABNORMAL HIGH (ref 78.0–100.0)
MCV: 101.5 fL — AB (ref 78.0–100.0)
Platelets: 114 10*3/uL — ABNORMAL LOW (ref 150–400)
Platelets: 106 10*3/uL — ABNORMAL LOW (ref 150–400)
RBC: 2.11 MIL/uL — ABNORMAL LOW (ref 4.22–5.81)
RBC: 1.98 MIL/uL — AB (ref 4.22–5.81)
RDW: 23.2 % — ABNORMAL HIGH (ref 11.5–15.5)
RDW: 23.6 % — ABNORMAL HIGH (ref 11.5–15.5)
WBC: 8.9 10*3/uL (ref 4.0–10.5)
WBC: 8.3 10*3/uL (ref 4.0–10.5)

## 2017-06-26 LAB — LIPASE, BLOOD: Lipase: 26 U/L (ref 11–51)

## 2017-06-26 LAB — PROTIME-INR
INR: 2.22
PROTHROMBIN TIME: 24.9 s — AB (ref 11.4–15.2)

## 2017-06-26 LAB — APTT: APTT: 44 s — AB (ref 24–36)

## 2017-06-26 MED ORDER — PANTOPRAZOLE SODIUM 40 MG PO TBEC: 40.0000 mg | DELAYED_RELEASE_TABLET | Freq: Two times a day (BID) | ORAL | Status: DC

## 2017-06-26 MED ORDER — THIAMINE HCL 100 MG/ML IJ SOLN: 100.0000 mg | Freq: Every day | INTRAMUSCULAR | Status: DC

## 2017-06-26 MED ORDER — FOLIC ACID 5 MG/ML IJ SOLN
1.0000 mg | Freq: Every day | INTRAMUSCULAR | Status: DC
  Filled 2017-06-26: qty 0.2

## 2017-06-26 MED ORDER — BISMUTH SUBSALICYLATE 262 MG PO CHEW: 262.0000 mg | CHEWABLE_TABLET | Freq: Three times a day (TID) | ORAL | Status: DC | PRN

## 2017-06-26 MED ORDER — LACTULOSE 10 GM/15ML PO SOLN
20.0000 g | Freq: Two times a day (BID) | ORAL | Status: DC
  Administered 2017-06-26 – 2017-06-29 (×6): 20 g via ORAL
  Filled 2017-06-26 (×7): qty 30

## 2017-06-26 MED ORDER — DEXTROSE 5 % IV SOLN
1.0000 g | INTRAVENOUS | Status: DC
  Administered 2017-06-26 – 2017-06-28 (×3): 1 g via INTRAVENOUS
  Filled 2017-06-26 (×4): qty 10

## 2017-06-26 MED ORDER — FUROSEMIDE 40 MG PO TABS
60.0000 mg | ORAL_TABLET | Freq: Every day | ORAL | Status: DC
  Administered 2017-06-27 – 2017-06-29 (×3): 60 mg via ORAL
  Filled 2017-06-26 (×3): qty 1

## 2017-06-26 MED ORDER — PHYTONADIONE 1 MG/0.5 ML ORAL SOLUTION
1.0000 mg | Freq: Once | ORAL | Status: DC
  Filled 2017-06-26 (×2): qty 0.5

## 2017-06-26 MED ORDER — SPIRONOLACTONE 25 MG PO TABS
100.0000 mg | ORAL_TABLET | Freq: Every day | ORAL | Status: DC
  Administered 2017-06-27 – 2017-06-29 (×3): 100 mg via ORAL
  Filled 2017-06-26 (×3): qty 4

## 2017-06-26 MED ORDER — ONDANSETRON HCL 4 MG PO TABS: 4.0000 mg | ORAL_TABLET | Freq: Four times a day (QID) | ORAL | Status: DC | PRN

## 2017-06-26 MED ORDER — FOLIC ACID 1 MG PO TABS: 1.0000 mg | ORAL_TABLET | Freq: Every morning | ORAL | Status: DC

## 2017-06-26 MED ORDER — ALBUMIN HUMAN 25 % IV SOLN
50.0000 g | Freq: Once | INTRAVENOUS | Status: AC
  Administered 2017-06-26: 50 g via INTRAVENOUS
  Filled 2017-06-26: qty 200

## 2017-06-26 MED ORDER — VITAMIN B-1 100 MG PO TABS
100.0000 mg | ORAL_TABLET | Freq: Every day | ORAL | Status: DC
  Administered 2017-06-26 – 2017-06-29 (×4): 100 mg via ORAL
  Filled 2017-06-26 (×4): qty 1

## 2017-06-26 MED ORDER — FOLIC ACID 1 MG PO TABS
1.0000 mg | ORAL_TABLET | Freq: Every day | ORAL | Status: DC
  Administered 2017-06-26 – 2017-06-29 (×4): 1 mg via ORAL
  Filled 2017-06-26 (×4): qty 1

## 2017-06-26 MED ORDER — SODIUM CHLORIDE 0.9 % IV SOLN: Freq: Once | INTRAVENOUS | Status: AC

## 2017-06-26 MED ORDER — SODIUM CHLORIDE 0.9 % IV SOLN
INTRAVENOUS | Status: DC
  Administered 2017-06-26 – 2017-06-29 (×5): via INTRAVENOUS

## 2017-06-26 MED ORDER — LIDOCAINE HCL (PF) 1 % IJ SOLN
INTRAMUSCULAR | Status: AC
  Filled 2017-06-26: qty 30

## 2017-06-26 MED ORDER — ALBUMIN HUMAN 25 % IV SOLN: 75.0000 g | Freq: Once | INTRAVENOUS | Status: DC

## 2017-06-26 MED ORDER — PANTOPRAZOLE SODIUM 40 MG PO TBEC
40.0000 mg | DELAYED_RELEASE_TABLET | Freq: Two times a day (BID) | ORAL | Status: DC
  Administered 2017-06-26 – 2017-06-29 (×6): 40 mg via ORAL
  Filled 2017-06-26 (×6): qty 1

## 2017-06-26 MED ORDER — SODIUM CHLORIDE 0.9 % IV SOLN
Freq: Once | INTRAVENOUS | Status: AC
  Administered 2017-06-26: 14:00:00 via INTRAVENOUS

## 2017-06-26 MED ORDER — CALCIUM CARBONATE ANTACID 500 MG PO CHEW: 1.0000 | CHEWABLE_TABLET | Freq: Three times a day (TID) | ORAL | Status: DC | PRN

## 2017-06-26 MED ORDER — POTASSIUM CHLORIDE CRYS ER 10 MEQ PO TBCR
10.0000 meq | EXTENDED_RELEASE_TABLET | Freq: Every day | ORAL | Status: DC
  Administered 2017-06-27 – 2017-06-29 (×3): 10 meq via ORAL
  Filled 2017-06-26 (×3): qty 1

## 2017-06-26 MED ORDER — ONDANSETRON HCL 4 MG/2ML IJ SOLN: 4.0000 mg | Freq: Four times a day (QID) | INTRAMUSCULAR | Status: DC | PRN

## 2017-06-26 NOTE — Consult Note (Signed)
[facility] Consult: 1:10 PM 06/26/2017  LOS: 0 days    Referring Provider: Providence Lanius PA-C in ED.  Primary Care Physician:  Alroy Dust, L.Marlou Sa, MD Primary Gastroenterologist:  Dr. Hilarie Fredrickson     Reason for Consultation:  Anemia and ascites.     HPI: Thomas [name] is a 56 y.o. male.  PMH alcoholic cirrhosis.  Portal htn.  Small esophageal varices.  Portal gastropathy.  HE.  Diuretic resistant ascites.  SBP.  Chronic Cipro for SBP prevention. Coagulopathy. Thrombocytopenia.  CKD.  Anemia, on chronic iron. Pleural effusion.  Tubular adenoma on 09/2015 Colonoscopy.  [place] screening is up to date with March 2018 ultrasound. He is to repeat screening September 2018. 02/2016 EGD for melena. Grade 1 distal esophageal varices. Moderate portal hypertensive gastropathy. 03/29/17 EGD for anemia.  2 stool grade 1 esophageal varices. Mild portal hypertensive gastropathy.  4 admissions thus far in 2018. 5/18 -04/30/17 admission with confusion/HE, anemia.  Hgb 6.9 >> after PRBC x 2 and parenteral iron.   HCAP.  Upper GIB from portal gastropathy.  04/29/17 EGD showed trace to small distal esophageal varices and moderate to severe portal gastropathy. He was noted not a candidate for beta blocker given his acute kidney injury and recent renal insufficiency Paracentesis, 3 liters. Briefly relieved c/o dyspnea.  03/05/17 right thoracentesis 730ml.   06/14/17 right thoracentesis 1.9 liters.  transudative fluid.      05/15/17 paracentesis 3 liters 06/11/17 paracentesis 3.5 liters.    7/5 - 06/16/17 admission with worsening HE: somnolence. MELD 32. Maroon stool.  Nausea, after taking Lactulose affected compliance. Increased right pleural effusion.  LFTs elevated from previous, t bil 8.7.  Hgb 5.7, transfused PRBC x 4.  Hgb 5.7 >> 10.6 at discharge. SOB  improved after thoracentesis.  Diuretics resumed at discharge, Lasix 60 mg, Aldactone 100 mg .   Spent 3 days last week at [facility] undergoing comprehensive testing as part of transplant screening.  Some sort of genetic testing, assuming this was for Wilson's disease, was abnormal in some way and at [facility] wants him to have 24-hour urinary copper collection.  He also needs dental work if he is to be transplant listed. [facility] said that they would have more information as to the patient's transplant candidacy at the end of this week. For several days the patient hasn't been able to take much in the way of PO. He becomes nauseated with PO and has been intermittently vomiting nonbloody material.  Abdomen is becoming more distended.  Lactulose especially triggers nausea.  Stools are loose and dark, his norm since he takes oral iron, and they occur about 5 times daily, also his norm.  Called GI yesterday requesting paracentesis for 7 # wt gain, the distention and discomfort.  Dr Hilarie Fredrickson approved up to 6 liter tap, fluid for cell count, with peri-tap albumin.  This set up for today but pt came to ED.  He got dizzy this morning and decided to come to the emergency department. AAS xray with white-out in right chest, pleural effusion. ? Gas in urinary bladder (recent  catheterization?) Urinalysis is not suspicious for infection Hgb 6.9.  Platelets 114, up from 75.  coags 24.9/2.2 in middle of recent historic range.  BUN/Creat 24/1.45 is worse than historic range.  LFTs elevated but not significantly worse than recent past.    Patient has not resumed use of Alka-Seltzer which he was using to help him belch and relieve abdominal distention prior to recent admission. Patient surmises that his last consumption of alcohol was in January 2018 but labs show that he had an elevated EtOH level in  April 2018.  In any event he hasn't been drinking alcohol recently. Is not clear whether or not the patient is a TIPS candidate, his  encephalopathy hx is certainly a relative contraindication. Dr. Vena Rua note of 03/05/17 states "not currently a candidate for TIPS".     Past Medical History:  Diagnosis Date  . Alcohol abuse 2013  . Alcoholic cirrhosis ([place]) 02/3824  . Anemia 02/2016   in setting of GI blood loss, but MCV macrocytic.   . Coagulopathy ([place]) 05/2015  . Diverticulosis 09/2015  . Esophageal varices ([place]) 09/2015   small  . GERD (gastroesophageal reflux disease)   . Hyperlipidemia   . Hypertension   . Pneumonia   . Portal hypertension ([place]) 09/2015   with portal gastropathy  . Thrombocytopenia ([place]) 02/2016  . Tubular adenoma of colon 09/2015    Past Surgical History:  Procedure Laterality Date  . ESOPHAGOGASTRODUODENOSCOPY N/A 03/05/2016   Procedure: ESOPHAGOGASTRODUODENOSCOPY (EGD);  Surgeon: Ladene Artist, MD;  Location: [name];  Service: Endoscopy;  Laterality: N/A;  . ESOPHAGOGASTRODUODENOSCOPY N/A 04/29/2017   Procedure: ESOPHAGOGASTRODUODENOSCOPY (EGD);  Surgeon: Milus Banister, MD;  Location: [name];  Service: Endoscopy;  Laterality: N/A;  . ESOPHAGOGASTRODUODENOSCOPY (EGD) WITH PROPOFOL N/A 03/29/2017   Procedure: ESOPHAGOGASTRODUODENOSCOPY (EGD) WITH PROPOFOL;  Surgeon: Doran Stabler, MD;  Location: [facility];  Service: Endoscopy;  Laterality: N/A;  . IR PARACENTESIS  05/15/2017  . IR PARACENTESIS  06/11/2017  . testicle prosthesis      Prior to Admission medications   Medication Sig Start Date End Date Taking? Authorizing Provider  ciprofloxacin (CIPRO) 500 MG tablet Take 1 tablet (500 mg total) by mouth every evening. 06/16/17   Dessa Phi Chahn-Yang, DO  ferrous sulfate 325 (65 FE) MG tablet Take 1 tablet (325 mg total) by mouth 3 (three) times daily with meals. Patient taking differently: Take 325 mg by mouth every evening.  04/30/17   Patrecia Pour, Christean Grief, MD  folic acid (FOLVITE) 1 MG tablet Take 1 tablet (1 mg total) by mouth at bedtime. 06/16/17   Dessa Phi  Chahn-Yang, DO  furosemide (LASIX) 40 MG tablet Take 1.5 tablets (60 mg total) by mouth daily. 04/30/17   Doreatha Lew, MD  lactulose (CHRONULAC) 10 GM/15ML solution Take 30 mLs (20 g total) by mouth 2 (two) times daily. 05/14/17   Pyrtle, Lajuan Lines, MD  pantoprazole (PROTONIX) 40 MG tablet TAKE 1 TABLET (40 MG TOTAL) BY MOUTH 2 (TWO) TIMES DAILY. 03/10/16   Pyrtle, Lajuan Lines, MD  potassium chloride (K-DUR,KLOR-CON) 10 MEQ tablet Take 1 tablet (10 mEq total) by mouth daily. 06/16/17   Dessa Phi Chahn-Yang, DO  spironolactone (ALDACTONE) 100 MG tablet Take 1 tablet (100 mg total) by mouth daily. 04/30/17   Doreatha Lew, MD    Scheduled Meds:  Infusions: . sodium chloride Stopped (06/26/17 1305)   PRN Meds:    Allergies as of 06/26/2017 - Review Complete 06/26/2017  Allergen Reaction  Noted  . Rifaximin Swelling and Other (See Comments) 11/25/2016    Family History  Problem Relation Age of Onset  . Diabetes Father   . Parkinson's disease Father   . Diabetes Paternal Aunt   . Glaucoma Mother     Social History   Social History  . Marital status: Married    Spouse name: Lelan Pons  . Number of children: 3  . Years of education: N/A   Occupational History  . Sales    Social History Main Topics  . Smoking status: Never Smoker  . Smokeless tobacco: Never Used  . Alcohol use No     Comment: Quit 2/17, relapse in 1/18 for short period of time  . Drug use: No  . Sexual activity: Not Currently   Other Topics Concern  . Not on file   Social History Narrative   Lives with wife.  Normally able to ambulate without assistance.    REVIEW OF SYSTEMS: Constitutional:  Weakness ENT:  No nose bleeds Pulm:  Dyspnea on exertion. No cough. Pleuritic pain. CV:  No palpitations, no LE edema. No chest pain GU:  No oliguria, no amber colored urine. GI:  Per HPI.   Heme:  Tends to bruise and bleed easily.   Transfusions:  Per HPI Neuro:  No headaches, no peripheral tingling or  numbness Derm:  No itching, no rash or sores.  Endocrine:  No sweats or chills.  No polyuria or dysuria.   Immunization:  Did not inquire as to status of hepatitis A and B vaccination. Travel:  None beyond local counties in last few months.    PHYSICAL EXAM: Vital signs in last 24 hours: Vitals:   06/26/17 1245 06/26/17 1300  BP: 124/66 114/77  Pulse: 91 90  Resp: (!) 25 (!) 23  Temp:     Wt Readings from Last 3 Encounters:  06/15/17 82.6 kg (182 lb)  06/06/17 84.8 kg (187 lb)  05/14/17 88.5 kg (195 lb 3.2 oz)   General: Pleasant, slightly jaundiced and icteric white male.  Head:  Some slight facial edema  Eyes:  No conjunctival pallor. Positive scleral icterus Ears:  Not hard of hearing.  Nose:  No congestion, no discharge. Mouth:  Moist, clear oral mucosa. Tongue midline. Some dental caries and missing fillings. Neck:  No mass no JVD, no thyromegaly. Lungs:  Diminished breath sounds on the right. No adventitious sounds. No cough. No dyspnea. Heart: RRR. No MRG. S1, S2 present. Abdomen:  Soft. Moderate distention/protuberance but soft. Bowel sounds hypoactive but not tingling or tympanitic.Marland Kitchen   GU:  No scrotal or penile edema Rectal: Deferred   Musc/Skeltl: No joint erythema, swelling or gross deformity. Extremities:  1 + pedal edema.  Neurologic:  Alert. Oriented times 3. Not confused. No asterixis or tremor. Limb strength full. Skin:  No rash, no sores. No telangiectasia. Tattoos:  None Nodes:  No cervical adenopathy   Psych:  Pleasant, cooperative. In good spirits.  Intake/Output from previous day: No intake/output data recorded. Intake/Output this shift: No intake/output data recorded.  LAB RESULTS:  Recent Labs  06/26/17 0939  WBC 8.9  HGB 6.9*  HCT 21.3*  PLT 114*   BMET Lab Results  Component Value Date   NA 136 06/26/2017   NA 136 06/16/2017   NA 137 06/15/2017   K 4.3 06/26/2017   K 3.6 06/16/2017   K 3.9 06/15/2017   CL 102 06/26/2017   CL  103 06/16/2017   CL 102 06/15/2017  CO2 26 06/26/2017   CO2 26 06/16/2017   CO2 27 06/15/2017   GLUCOSE 109 (H) 06/26/2017   GLUCOSE 136 (H) 06/16/2017   GLUCOSE 95 06/15/2017   BUN 24 (H) 06/26/2017   BUN 14 06/16/2017   BUN 19 06/15/2017   CREATININE 1.45 (H) 06/26/2017   CREATININE 1.11 06/16/2017   CREATININE 1.27 (H) 06/15/2017   CALCIUM 9.4 06/26/2017   CALCIUM 8.3 (L) 06/16/2017   CALCIUM 8.4 (L) 06/15/2017   LFT  Recent Labs  06/26/17 0939  PROT 6.0*  ALBUMIN 2.2*  AST 71*  ALT 34  ALKPHOS 91  BILITOT 9.7*   PT/INR Lab Results  Component Value Date   INR 2.22 06/26/2017   INR 2.19 06/15/2017   INR 2.12 06/14/2017   Hepatitis Panel No results for input(s): HEPBSAG, HCVAB, HEPAIGM, HEPBIGM in the last 72 hours. C-Diff No components found for: CDIFF Lipase     Component Value Date/Time   LIPASE 26 06/26/2017 0939    Drugs of Abuse     Component Value Date/Time   LABOPIA NONE DETECTED 04/30/2017 0750   COCAINSCRNUR NONE DETECTED 04/30/2017 0750   LABBENZ POSITIVE (A) 04/30/2017 0750   AMPHETMU NONE DETECTED 04/30/2017 0750   THCU NONE DETECTED 04/30/2017 0750   LABBARB NONE DETECTED 04/30/2017 0750     RADIOLOGY STUDIES: Dg Abd Acute W/chest  Result Date: 06/26/2017 CLINICAL DATA:  Abdominal pain. EXAM: DG ABDOMEN ACUTE W/ 1V CHEST COMPARISON:  06/14/2017 FINDINGS: Dema Severin out of the right chest. Patient has recurrent pleural effusion on the right based on prior studies. The left lung is clear. Normal visualized heart. There could be gas in the urinary bladder. Nonobstructive bowel gas pattern. No evidence of pneumoperitoneum. IMPRESSION: 1. White out of the right chest. Patient has recurrent right pleural effusion based on previous imaging. 2. Normal bowel gas pattern. 3. Question gas in the urinary bladder, has there been catheterization for sampling? Electronically Signed   By: Monte Fantasia M.D.   On: 06/26/2017 12:43     IMPRESSION:   *   Ascites.  Weight gain and increased ascites.  Clinically it appears that the ascites is causing some mass effect on the stomach leading to early satiety, lack of by mouth tolerance. Was for repeat large volume tap today.  Just tapped on 7/2.   ? Is he a TIPS candidate.  HE could worsen.       *  Progressive right pleural effusion.  2 thoracentesis in last 4 months, latest 1.9 liters on 06/14/17  *  AKI.  Developed AKI recently and had to hold diuretics.  *  Recurrent anemia in pt with multiple recent transfusions for acute on chronic anemia due to blood loss from portal hypertensive related bleeding.   No overt bleeding clinically.  *  Urinary bladder air/gas.  No UTI on today's urinalysis.  *  Cirrhosis.  Pt in midst of transplant screening at [facility].     *  Thrombocytopenia and coagulopathy.   *  History HD. Not acutely decompensated. Unable to use rifaximin as this caused generalized edema in the past. Recently hasn't been able to tolerate lactulose.   PLAN:     *  I dropped by IR, they plan to proceed with the paracentesis today.    *  Patient has received a single dose of Rocephin but I'm wondering if continuing his Cipro for SBP prophylaxis may be adequate?  *  Allow full liquid diet.   *  Does not  need IV PPI.  Continue BID oral Protonix as at home    Azucena Freed  06/26/2017, 1:10 PM Pager: [phone]

## 2017-06-26 NOTE — H&P (Addendum)
History and Physical    Thomas [name] EHO:[id number] DOB: 04/29/1961 DOA: 06/26/2017  PCP: Alroy Dust, L.Marlou Sa, MD Patient coming from: Home  Chief Complaint: n/v  HPI: Thomas [name] is a 56 y.o. male with medical history significant of  EtOH abuse the patient has been sober for some time. Alcoholic cirrhosis currently attempting to get on the transplant list at [place], macrocytic anemia, esophageal varices, GERD, hyperlipidemia, hypertension, thrombus cytopenia, tubular adenoma.   Patient reports a 5 day history of progressive nausea and vomiting. Unable to tolerate food and liquid. Unable to tolerate home medications. Emesis is nonbloody nonbilious. Patient continues to complain of intermittent black tarry stools which is a chronic issue for him. He states that during this time his abdomen has become progressively larger and tighter acute difficult for him to eat. Patient also reports worsening shortness of breath over the last 5-7 days. Only able to ambulate a short distance before having to rest. Is a marked departure from his normal. Denies chest pain, palpitations, fevers, abdominal pain, dysuria, frequency, diarrhea, back pain, focal neurological deficit. Patient states he discussed his situation with his gastroenterology team who scheduled him for outpatient paracentesis on 06/26/2017. Of note during previous admission patient had thoracentesis performed on 06/14/2017 with 1.9 L removed at that time.  ED Course: GI and IR consult by EDP. Objective findings outlined below.  Review of Systems: As per HPI otherwise all other systems reviewed and are negative  Ambulatory Status: Limited secondary to progressive cirrhosis.  Past Medical History:  Diagnosis Date  . Alcohol abuse 2013  . Alcoholic cirrhosis ([place]) 02/7047  . Anemia 02/2016   in setting of GI blood loss, but MCV macrocytic.   . Coagulopathy ([place]) 05/2015  . Diverticulosis 09/2015  . Esophageal varices ([place]) 09/2015   small  . GERD  (gastroesophageal reflux disease)   . Hyperlipidemia   . Hypertension   . Pneumonia   . Portal hypertension ([place]) 09/2015   with portal gastropathy  . Thrombocytopenia ([place]) 02/2016  . Tubular adenoma of colon 09/2015    Past Surgical History:  Procedure Laterality Date  . ESOPHAGOGASTRODUODENOSCOPY N/A 03/05/2016   Procedure: ESOPHAGOGASTRODUODENOSCOPY (EGD);  Surgeon: Ladene Artist, MD;  Location: [name];  Service: Endoscopy;  Laterality: N/A;  . ESOPHAGOGASTRODUODENOSCOPY N/A 04/29/2017   Procedure: ESOPHAGOGASTRODUODENOSCOPY (EGD);  Surgeon: Milus Banister, MD;  Location: [name];  Service: Endoscopy;  Laterality: N/A;  . ESOPHAGOGASTRODUODENOSCOPY (EGD) WITH PROPOFOL N/A 03/29/2017   Procedure: ESOPHAGOGASTRODUODENOSCOPY (EGD) WITH PROPOFOL;  Surgeon: Doran Stabler, MD;  Location: [facility];  Service: Endoscopy;  Laterality: N/A;  . IR PARACENTESIS  05/15/2017  . IR PARACENTESIS  06/11/2017  . testicle prosthesis      Social History   Social History  . Marital status: Married    Spouse name: Lelan Pons  . Number of children: 3  . Years of education: N/A   Occupational History  . Sales    Social History Main Topics  . Smoking status: Never Smoker  . Smokeless tobacco: Never Used  . Alcohol use No     Comment: Quit 2/17, relapse in 1/18 for short period of time  . Drug use: No  . Sexual activity: Not Currently   Other Topics Concern  . Not on file   Social History Narrative   Lives with wife.  Normally able to ambulate without assistance.    Allergies  Allergen Reactions  . Rifaximin Swelling and Other (See Comments)    Reaction:  All over  body swelling     Family History  Problem Relation Age of Onset  . Diabetes Father   . Parkinson's disease Father   . Diabetes Paternal Aunt   . Glaucoma Mother       Prior to Admission medications   Medication Sig Start Date End Date Taking? Authorizing Provider  ciprofloxacin (CIPRO) 500 MG tablet Take  1 tablet (500 mg total) by mouth every evening. 06/16/17   Dessa Phi Chahn-Yang, DO  ferrous sulfate 325 (65 FE) MG tablet Take 1 tablet (325 mg total) by mouth 3 (three) times daily with meals. Patient taking differently: Take 325 mg by mouth every evening.  04/30/17   Patrecia Pour, Christean Grief, MD  folic acid (FOLVITE) 1 MG tablet Take 1 tablet (1 mg total) by mouth at bedtime. 06/16/17   Dessa Phi Chahn-Yang, DO  furosemide (LASIX) 40 MG tablet Take 1.5 tablets (60 mg total) by mouth daily. 04/30/17   Doreatha Lew, MD  lactulose (CHRONULAC) 10 GM/15ML solution Take 30 mLs (20 g total) by mouth 2 (two) times daily. 05/14/17   Pyrtle, Lajuan Lines, MD  pantoprazole (PROTONIX) 40 MG tablet TAKE 1 TABLET (40 MG TOTAL) BY MOUTH 2 (TWO) TIMES DAILY. 03/10/16   Pyrtle, Lajuan Lines, MD  potassium chloride (K-DUR,KLOR-CON) 10 MEQ tablet Take 1 tablet (10 mEq total) by mouth daily. 06/16/17   Dessa Phi Chahn-Yang, DO  spironolactone (ALDACTONE) 100 MG tablet Take 1 tablet (100 mg total) by mouth daily. 04/30/17   Doreatha Lew, MD    Physical Exam: Vitals:   06/26/17 1230 06/26/17 1245 06/26/17 1300 06/26/17 1315  BP: (!) 125/49 124/66 114/77 131/62  Pulse: 91 91 90 94  Resp: (!) 26 (!) 25 (!) 23 (!) 23  Temp:      TempSrc:      SpO2: 98% 97% 98% 98%  Height:         General: Ill-appearing, resting in bed. Eyes: Mildly icteric sclera, PERRL, EOMI,  ENT:  grossly normal hearing, lips & tongue, mmm Neck:  no LAD, masses or thyromegaly Cardiovascular:  RRR, no m/r/g. 1+ LE edema.  Respiratory: Absent breath sounds on the right. Appreciate cardiac sounds on the right which is likely due to transmission of heart sounds through pleural effusion. Normal effort. No wheezes or rhonchi. Abdomen:  soft, ntnd, NABS Skin: Scaling and darkening of the skin along the distal lower extremities bilaterally consistent with venous stasis dermatitis. Musculoskeletal: Global weakness of upper extremities and lower  extremities. No bony upper modalities. Psychiatric:  grossly normal mood and affect, speech fluent and appropriate, AOx3 Neurologic:  CN 2-12 grossly intact, moves all extremities in coordinated fashion, sensation intact  Labs on Admission: I have personally reviewed following labs and imaging studies  CBC:  Recent Labs Lab 06/26/17 0939  WBC 8.9  HGB 6.9*  HCT 21.3*  MCV 100.9*  PLT 240*   Basic Metabolic Panel:  Recent Labs Lab 06/26/17 0939  NA 136  K 4.3  CL 102  CO2 26  GLUCOSE 109*  BUN 24*  CREATININE 1.45*  CALCIUM 9.4   GFR: Estimated Creatinine Clearance: 55.9 mL/min (A) (by C-G formula based on SCr of 1.45 mg/dL (H)). Liver Function Tests:  Recent Labs Lab 06/26/17 0939  AST 71*  ALT 34  ALKPHOS 91  BILITOT 9.7*  PROT 6.0*  ALBUMIN 2.2*    Recent Labs Lab 06/26/17 0939  LIPASE 26    Recent Labs Lab 06/26/17 1135  AMMONIA 24  Coagulation Profile:  Recent Labs Lab 06/26/17 1135  INR 2.22   Cardiac Enzymes: No results for input(s): CKTOTAL, CKMB, CKMBINDEX, TROPONINI in the last 168 hours. BNP (last 3 results) No results for input(s): PROBNP in the last 8760 hours. HbA1C: No results for input(s): HGBA1C in the last 72 hours. CBG: No results for input(s): GLUCAP in the last 168 hours. Lipid Profile: No results for input(s): CHOL, HDL, LDLCALC, TRIG, CHOLHDL, LDLDIRECT in the last 72 hours. Thyroid Function Tests: No results for input(s): TSH, T4TOTAL, FREET4, T3FREE, THYROIDAB in the last 72 hours. Anemia Panel: No results for input(s): VITAMINB12, FOLATE, FERRITIN, TIBC, IRON, RETICCTPCT in the last 72 hours. Urine analysis:    Component Value Date/Time   COLORURINE AMBER (A) 06/26/2017 1329   APPEARANCEUR CLEAR 06/26/2017 1329   LABSPEC 1.016 06/26/2017 1329   PHURINE 5.0 06/26/2017 1329   GLUCOSEU NEGATIVE 06/26/2017 1329   HGBUR NEGATIVE 06/26/2017 1329   BILIRUBINUR NEGATIVE 06/26/2017 1329   KETONESUR NEGATIVE  06/26/2017 1329   PROTEINUR NEGATIVE 06/26/2017 1329   UROBILINOGEN 0.2 11/22/2012 0856   NITRITE NEGATIVE 06/26/2017 1329   LEUKOCYTESUR NEGATIVE 06/26/2017 1329    Creatinine Clearance: Estimated Creatinine Clearance: 55.9 mL/min (A) (by C-G formula based on SCr of 1.45 mg/dL (H)).  Sepsis Labs: @LABRCNTIP (procalcitonin:4,lacticidven:4) )No results found for this or any previous visit (from the past 240 hour(s)).   Radiological Exams on Admission: Dg Abd Acute W/chest  Result Date: 06/26/2017 CLINICAL DATA:  Abdominal pain. EXAM: DG ABDOMEN ACUTE W/ 1V CHEST COMPARISON:  06/14/2017 FINDINGS: Dema Severin out of the right chest. Patient has recurrent pleural effusion on the right based on prior studies. The left lung is clear. Normal visualized heart. There could be gas in the urinary bladder. Nonobstructive bowel gas pattern. No evidence of pneumoperitoneum. IMPRESSION: 1. White out of the right chest. Patient has recurrent right pleural effusion based on previous imaging. 2. Normal bowel gas pattern. 3. Question gas in the urinary bladder, has there been catheterization for sampling? Electronically Signed   By: Monte Fantasia M.D.   On: 06/26/2017 12:43    EKG: pending   Assessment/Plan Active Problems:   Acute kidney injury (HCC)   Thrombocytopenia (HCC)   Esophageal varices (HCC)   Acute blood loss anemia   End stage liver disease (HCC)   Alcoholic cirrhosis of liver with ascites (HCC)   Pleural effusion associated with hepatic disorder   Symptomatic anemia   Hypotension   Acute upper GI bleed   Alcoholic cirrhosis: Patient just completed several day admission and workup at [facility] in order to be placed on the deep transplant list. MELD score 28.  - continue management through [place] transplant center.  - continue lactulose, lasix, spironolactone  Symptomatic anemia: Hemoglobin 6.9. Baseline approximately 10. Suspect secondary to GI bleed and more specifically bleeding varices. - 2  units PRBC - CBC every 12 - FFP, vitamin K  GI bleed: Chronic/intermittent problem for patient. Suspect secondary to bleeding varices. Recent hospitalization for the same. GI following. Patient auto anticoagulating due to advanced cirrhosis with an INR of 2.22. - Further recommendations per GI team. -Consider octreotide  Recurrent pleural effusion: Recurrent worsening of right pleural effusion. Last thoracentesis 1.9 L on 06/14/2017. Patient with progressive shortness of breath/DOE. Paracentesis planned for 06/26/2017. Suspect from progressive cirrhosis. Last pleural effusion was transudative and cultures were negative. - IR consulted for thoracentesis on 06/27/2017.  AKI: cr 1.45. Baseline 1. Likely from poor oral intake for several days.  - IVF -  BMP in am   DVT prophylaxis: SCD  Code Status: Partial Family Communication: wife  Disposition Plan: pending paracentesis and thoracentesis.   Consults called: GI, IR  Admission status: inpt    Kinzley Savell J MD [facility]  If 7PM-7AM, please contact night-coverage [email or web address] Password [email or web address]  06/26/2017, 3:15 PM

## 2017-06-26 NOTE — ED Notes (Signed)
Attempted to infuse RBC's.  Pt was taken to IR

## 2017-06-26 NOTE — ED Provider Notes (Signed)
Medical screening examination/treatment/procedure(s) were conducted as a shared visit with non-physician practitioner(s) and myself.  I personally evaluated the patient during the encounter.   EKG Interpretation None      56 year old male with history of alcoholic cirrhosis c/b EV and chronic GI bleed who presents with abdominal distension, nausea and vomiting for last 5 days. Non blood emesis but having persistent black tarry stools. Recently admitted for acute blood loss anemia due to suspected bleeding from EV. Not candidate for TIPS per GI, and currently under evaluation for liver transplant.   He is hemodynamically stable. Ongoing melanotic stools.   With hgb 6.9 likely due to chronic GI bleed. Plan for blood transfusion. Consider ceftriaxone for SBP ppx and octretide drip and will consult GI. INR also elevated due to liver disease and consider transfusion of FFP. Scheduled for paracentesis for which IR still plans on performing inpatient. Admit to hospitalist service.    Forde Dandy, MD 06/26/17 [phone]

## 2017-06-26 NOTE — ED Provider Notes (Signed)
[place] [facility] DEPT Provider Note   CSN: [id number] Arrival date & time: 06/26/17  5027     History   Chief Complaint Chief Complaint  Patient presents with  . Emesis    HPI Thomas [name] is a 56 y.o. male with past medical history of alcoholic cirrhosis, anemia hypertension, esophageal varices who presents with 5 days of persistent nausea/vomiting, increased abdominal distention. Patient has a known history of liver failure and is currently being evaluated by [facility] with possibility of receiving a liver transplant. He reports that over the last 5 days he has had persistent nausea/vomiting. He is not tolerating any fluids. This is nonbloody, nonbilious. Patient also reports that he has been having black tarry stools but states that this is a chronic issue. He has not been able to take his regular medications, including lactulose. She was recently admitted to the hospital on 06/13/17. At that time he was found to be anemic with critically low hemoglobin of 5.7. He received a blood transfusion of 2 units of PRBCs in the emergency department. He received an additional 2 units of red blood cells and FFP during admission. At time he is also found to have a right-sided pleural effusion. He had a thoracentesis performed on 7/5 with 1.9 L of fluid removed. Patient is scheduled to have a paracentesis done of his abdomen at interventional radiology at [facility] at 1 PM this afternoon. Patient denies any fevers, chest pain, difficulty breathing, dysuria, hematuria.  The history is provided by the patient.    Past Medical History:  Diagnosis Date  . Alcohol abuse 2013  . Alcoholic cirrhosis ([place]) 06/4127  . Anemia 02/2016   in setting of GI blood loss, but MCV macrocytic.   . Coagulopathy ([place]) 05/2015  . Diverticulosis 09/2015  . Esophageal varices ([place]) 09/2015   small  . GERD (gastroesophageal reflux disease)   . Hyperlipidemia   . Hypertension   . Pneumonia   . Portal hypertension  ([place]) 09/2015   with portal gastropathy  . Thrombocytopenia ([place]) 02/2016  . Tubular adenoma of colon 09/2015    Patient Active Problem List   Diagnosis Date Noted  . Acute upper GI bleed 06/13/2017  . Encephalopathy acute 04/27/2017  . HCAP (healthcare-associated pneumonia) 04/27/2017  . Acute renal failure (ARF) ([place]) 03/28/2017  . Hyperglycemia 03/28/2017  . Hypoalbuminemia 03/28/2017  . Edema 03/28/2017  . S/P thoracentesis   . Symptomatic anemia   . Dyspnea 03/03/2017  . Hypomagnesemia 11/26/2016  . Acute respiratory failure with hypoxia ([place]) 11/24/2016  . Pleural effusion associated with hepatic disorder 11/24/2016  . Hypokalemia 11/24/2016  . SBP (spontaneous bacterial peritonitis) ([place]) 05/09/2016  . Severe sepsis ([place]) 05/09/2016  . Decompensation of cirrhosis of liver ([place]) 05/07/2016  . Hypotension 05/07/2016  . Ascites due to alcoholic cirrhosis ([place]) 78/67/6720  . Localized edema 04/17/2016  . Alcoholic cirrhosis of liver with ascites ([place])   . Bleeding gastrointestinal   . End stage liver disease ([place])   . Upper GI bleed 03/05/2016  . Coagulopathy ([place]) 03/05/2016  . Acute kidney injury ([place]) 03/05/2016  . Hepatic encephalopathy ([place]) 03/05/2016  . Macrocytic anemia 03/05/2016  . Thrombocytopenia ([place]) 03/05/2016  . Esophageal varices ([place]) 03/05/2016  . Acute blood loss anemia 03/05/2016  . Idiopathic esophageal varices without bleeding ([place])   . Portal hypertensive gastropathy ([place])   . Encephalopathy, metabolic 94/70/9628    Past Surgical History:  Procedure Laterality Date  . ESOPHAGOGASTRODUODENOSCOPY N/A 03/05/2016   Procedure: ESOPHAGOGASTRODUODENOSCOPY (EGD);  Surgeon: Ladene Artist, MD;  Location: [name];  Service: Endoscopy;  Laterality: N/A;  . ESOPHAGOGASTRODUODENOSCOPY N/A 04/29/2017   Procedure: ESOPHAGOGASTRODUODENOSCOPY (EGD);  Surgeon: Milus Banister, MD;  Location: [name];  Service: Endoscopy;  Laterality: N/A;  .  ESOPHAGOGASTRODUODENOSCOPY (EGD) WITH PROPOFOL N/A 03/29/2017   Procedure: ESOPHAGOGASTRODUODENOSCOPY (EGD) WITH PROPOFOL;  Surgeon: Doran Stabler, MD;  Location: [facility];  Service: Endoscopy;  Laterality: N/A;  . IR PARACENTESIS  05/15/2017  . IR PARACENTESIS  06/11/2017  . IR PARACENTESIS  06/26/2017  . testicle prosthesis         Home Medications    Prior to Admission medications   Medication Sig Start Date End Date Taking? Authorizing Provider  bismuth subsalicylate (PEPTO BISMOL) 262 MG chewable tablet Chew 262-524 mg by mouth 3 (three) times daily as needed for indigestion.   Yes [provider]  calcium carbonate (TUMS EX) 750 MG chewable tablet Chew 1-2 tablets by mouth 3 (three) times daily as needed for heartburn.   Yes [provider]  calcium-vitamin D (OSCAL WITH D) 500-200 MG-UNIT tablet Take 1 tablet by mouth 2 (two) times daily.   Yes [provider]  ciprofloxacin (CIPRO) 500 MG tablet Take 1 tablet (500 mg total) by mouth every evening. 06/16/17  Yes Dessa Phi Chahn-Yang, DO  Dextromethorphan HBr (TUSSIN COUGH PO) Take 2 capsules by mouth daily as needed (cough).   Yes [provider]  ergocalciferol (VITAMIN D2) 50000 units capsule Take 50,000 Units by mouth once a week.   Yes [provider]  ferrous sulfate 325 (65 FE) MG tablet Take 1 tablet (325 mg total) by mouth 3 (three) times daily with meals. Patient taking differently: Take 325 mg by mouth every evening.  04/30/17  Yes Patrecia Pour, Christean Grief, MD  folic acid (FOLVITE) 1 MG tablet Take 1 tablet (1 mg total) by mouth at bedtime. Patient taking differently: Take 1 mg by mouth every morning.  06/16/17  Yes Dessa Phi Chahn-Yang, DO  furosemide (LASIX) 40 MG tablet Take 1.5 tablets (60 mg total) by mouth daily. 04/30/17  Yes Patrecia Pour, Christean Grief, MD  lactulose (CHRONULAC) 10 GM/15ML solution Take 30 mLs (20 g total) by mouth 2 (two) times daily. 05/14/17  Yes Pyrtle, Lajuan Lines,  MD  pantoprazole (PROTONIX) 40 MG tablet TAKE 1 TABLET (40 MG TOTAL) BY MOUTH 2 (TWO) TIMES DAILY. 03/10/16  Yes Pyrtle, Lajuan Lines, MD  potassium chloride (K-DUR,KLOR-CON) 10 MEQ tablet Take 1 tablet (10 mEq total) by mouth daily. 06/16/17  Yes Dessa Phi Chahn-Yang, DO  spironolactone (ALDACTONE) 100 MG tablet Take 1 tablet (100 mg total) by mouth daily. 04/30/17  Yes Doreatha Lew, MD    Family History Family History  Problem Relation Age of Onset  . Diabetes Father   . Parkinson's disease Father   . Diabetes Paternal Aunt   . Glaucoma Mother     Social History Social History  Substance Use Topics  . Smoking status: Never Smoker  . Smokeless tobacco: Never Used  . Alcohol use No     Comment: Quit 2/17, relapse in 1/18 for short period of time     Allergies   Rifaximin   Review of Systems Review of Systems  Constitutional: Positive for chills. Negative for fever.  Eyes: Negative for visual disturbance.  Respiratory: Negative for cough and shortness of breath.   Cardiovascular: Negative for chest pain.  Gastrointestinal: Positive for abdominal distention, abdominal pain, blood in stool (Chronic), nausea and  vomiting. Negative for diarrhea.  Genitourinary: Negative for dysuria and hematuria.  Skin: Negative for rash.  Neurological: Positive for weakness (Generalized). Negative for numbness and headaches.     Physical Exam Updated Vital Signs BP 115/69   Pulse 91   Temp 98.4 F (36.9 C) (Oral)   Resp (!) 25   Ht 5\' 4"  (1.626 m)   Wt 80.9 kg (178 lb 4.8 oz)   SpO2 96%   BMI 30.61 kg/m   Physical Exam  Constitutional: He is oriented to person, place, and time. He appears well-developed and well-nourished.  Appears uncomfortable but no acute distress  HENT:  Head: Normocephalic and atraumatic.  Mouth/Throat: Oropharynx is clear and moist and mucous membranes are normal.  Eyes: Pupils are equal, round, and reactive to light. Conjunctivae, EOM and lids are  normal. Scleral icterus is present.  Scleral icterus bilaterally.  Neck: Full passive range of motion without pain.  Cardiovascular: Normal rate, regular rhythm, normal heart sounds and normal pulses.  Exam reveals no gallop and no friction rub.   No murmur heard. Pulmonary/Chest: Effort normal and breath sounds normal.  Abdominal: He exhibits distension. Bowel sounds are decreased. There is tenderness. There is no rigidity and no guarding.  Abdomen is distended with dullness to percussion. No peritoneal signs.  Genitourinary: Rectal exam shows guaiac positive stool. Rectal exam shows no mass and no tenderness.  Genitourinary Comments: The exam was performed with a chaperone present. No rectal mass or fluctuance. No gross blood. Presence of dark stool at the anus.   Musculoskeletal: Normal range of motion.  Neurological: He is alert and oriented to person, place, and time.  Skin: Skin is warm and dry. Capillary refill takes less than 2 seconds.  Slight jaundice  Psychiatric: He has a normal mood and affect. His speech is normal.  Nursing note and vitals reviewed.    ED Treatments / Results  Labs (all labs ordered are listed, but only abnormal results are displayed) Labs Reviewed  COMPREHENSIVE METABOLIC PANEL - Abnormal; Notable for the following:       Result Value   Glucose, Bld 109 (*)    BUN 24 (*)    Creatinine, Ser 1.45 (*)    Total Protein 6.0 (*)    Albumin 2.2 (*)    AST 71 (*)    Total Bilirubin 9.7 (*)    GFR calc non Af Amer 53 (*)    All other components within normal limits  CBC - Abnormal; Notable for the following:    RBC 2.11 (*)    Hemoglobin 6.9 (*)    HCT 21.3 (*)    MCV 100.9 (*)    RDW 23.2 (*)    Platelets 114 (*)    All other components within normal limits  URINALYSIS, ROUTINE W REFLEX MICROSCOPIC - Abnormal; Notable for the following:    Color, Urine AMBER (*)    All other components within normal limits  PROTIME-INR - Abnormal; Notable for the  following:    Prothrombin Time 24.9 (*)    All other components within normal limits  APTT - Abnormal; Notable for the following:    aPTT 44 (*)    All other components within normal limits  CBC - Abnormal; Notable for the following:    RBC 1.98 (*)    Hemoglobin 6.5 (*)    HCT 20.1 (*)    MCV 101.5 (*)    RDW 23.6 (*)    Platelets 106 (*)    All  other components within normal limits  POC OCCULT BLOOD, ED - Abnormal; Notable for the following:    Fecal Occult Bld POSITIVE (*)    All other components within normal limits  BODY FLUID CULTURE  GRAM STAIN  MRSA PCR SCREENING  LIPASE, BLOOD  AMMONIA  SYNOVIAL CELL COUNT + DIFF, W/ CRYSTALS  GLUCOSE, SYNOVIAL FLUID  CBC  COMPREHENSIVE METABOLIC PANEL  CBG MONITORING, ED  TYPE AND SCREEN  PREPARE RBC (CROSSMATCH)  ABO/RH  PREPARE FRESH FROZEN PLASMA    EKG  EKG Interpretation None       Radiology Dg Abd Acute W/chest  Result Date: 06/26/2017 CLINICAL DATA:  Abdominal pain. EXAM: DG ABDOMEN ACUTE W/ 1V CHEST COMPARISON:  06/14/2017 FINDINGS: Dema Severin out of the right chest. Patient has recurrent pleural effusion on the right based on prior studies. The left lung is clear. Normal visualized heart. There could be gas in the urinary bladder. Nonobstructive bowel gas pattern. No evidence of pneumoperitoneum. IMPRESSION: 1. White out of the right chest. Patient has recurrent right pleural effusion based on previous imaging. 2. Normal bowel gas pattern. 3. Question gas in the urinary bladder, has there been catheterization for sampling? Electronically Signed   By: Monte Fantasia M.D.   On: 06/26/2017 12:43   Ir Paracentesis  Result Date: 06/26/2017 INDICATION: Abdominal distention. History of cirrhosis. Recurrent ascites. Request diagnostic and therapeutic paracentesis. EXAM: ULTRASOUND GUIDED LEFT LOWER QUADRANT PARACENTESIS MEDICATIONS: None. COMPLICATIONS: None immediate. PROCEDURE: Informed written consent was obtained from the  patient after a discussion of the risks, benefits and alternatives to treatment. A timeout was performed prior to the initiation of the procedure. Initial ultrasound scanning demonstrates a large amount of ascites within the left lower abdominal quadrant. The left lower abdomen was prepped and draped in the usual sterile fashion. 1% lidocaine with epinephrine was used for local anesthesia. Following this, a 19 gauge, 7-cm, Yueh catheter was introduced. An ultrasound image was saved for documentation purposes. The paracentesis was performed. The catheter was removed and a dressing was applied. The patient tolerated the procedure well without immediate post procedural complication. FINDINGS: A total of approximately 3.7 L of clear yellow fluid was removed. Samples were sent to the laboratory as requested by the clinical team. IMPRESSION: Successful ultrasound-guided paracentesis yielding 3.7 liters of peritoneal fluid. Read by: Ascencion Dike PA-C Electronically Signed   By: Aletta Edouard M.D.   On: 06/26/2017 16:40    Procedures Procedures (including critical care time)  Medications Ordered in ED Medications  cefTRIAXone (ROCEPHIN) 1 g in dextrose 5 % 50 mL IVPB (0 g Intravenous Stopped 06/26/17 1413)  pantoprazole (PROTONIX) EC tablet 40 mg (40 mg Oral Given 06/26/17 1752)  phytonadione (VITAMIN K) oral solution 1 mg/0.5 mL (not administered)  bismuth subsalicylate (PEPTO BISMOL) chewable tablet 262-524 mg (not administered)  calcium carbonate (TUMS - dosed in mg elemental calcium) chewable tablet 200-400 mg of elemental calcium (not administered)  potassium chloride (K-DUR,KLOR-CON) CR tablet 10 mEq (not administered)  lactulose (CHRONULAC) 10 GM/15ML solution 20 g (20 g Oral Given 06/26/17 1753)  furosemide (LASIX) tablet 60 mg (not administered)  spironolactone (ALDACTONE) tablet 100 mg (not administered)  0.9 %  sodium chloride infusion ( Intravenous New Bag/Given 06/26/17 1705)  ondansetron  (ZOFRAN) tablet 4 mg (not administered)    Or  ondansetron (ZOFRAN) injection 4 mg (not administered)  folic acid (FOLVITE) tablet 1 mg (1 mg Oral Given 06/26/17 1753)  thiamine (VITAMIN B-1) tablet 100 mg (100 mg Oral Given  06/26/17 1753)  0.9 %  sodium chloride infusion ( Intravenous Stopped 06/26/17 1701)  0.9 %  sodium chloride infusion ( Intravenous Duplicate 2/94/76 5465)  albumin human 25 % solution 50 g (50 g Intravenous New Bag/Given 06/26/17 1755)     Initial Impression / Assessment and Plan / ED Course  I have reviewed the triage vital signs and the nursing notes.  Pertinent labs & imaging results that were available during my care of the patient were reviewed by me and considered in my medical decision making (see chart for details).     56 year old male with past history of alcoholic cirrhosis, chronic anemia, SPEP who presents with findings of nausea/vomiting and increasingly worsening abdominal distention. Patient is afebrile, non-toxic appearing, sitting comfortably on examination table. Vital signs reviewed and stable. He was recently admitted on 06/13/17 for symptomatic anemia and GI bleed. He received 2 u of PRBCs in the ED. Patient was discharged home on 06/16/09 with a Hgb of 10.7. Physical exam shows that abdomen is distended with diffuse generalized tenderness. Consider ascites secondary to cirrhosis. Patient has a history of SBP and is on chronic cipro for prophylaxis treatment. History/physical exam show low suspicion for SBP, but also consider. Check basic labs including CBC, CMP, UA, PT/INR, PTT, EKG, Acute abdominal series.  Patient was scheduled to have a paracentesis with [facility] this afternoon. Will contact them for possible IR while in the department.  Discussed with IR. They will be able to take the patient for paracentesis and bring him back to the emergency department.   Labs and imaging reviewed.  Hemoglobin is 6.9. Records reviewed show that at discharged on  06/19/16 he was 10.9. He will need transfusion . CMP shows elevation in BUN/Cr at 24 and 1.45, likely a result of dehydration secondary to nausea/vomiting. Lipase is unremarkable. Ammonia unremarkable.  Acute abdominal series shows recurrent right pleural effusion. UA is pending.   Patient is guaiac positive on fecal occult evaluation. Will likely need admission for evaluation of symptomatic anemia and blood transfusion. INR is prolonged. Will consult GI for evaluation.   Discussed with GI. They will come see patient in the ED. Recommends starting on Ceftriaxone for prophylaxis of SBP. Does not recommend starting oceotride at this time but will if need to be started, GI PA will start it after evaluation. Consult to hospitalist placed.   Discussed with hospitalist. Will plan to admit.   Final Clinical Impressions(s) / ED Diagnoses   Final diagnoses:  Abdominal pain  Ascites due to alcoholic cirrhosis ([place])  Pleural effusion    New Prescriptions Current Discharge Medication List       Desma Mcgregor 06/26/17 1847    Forde Dandy, MD 06/26/17 [phone]

## 2017-06-26 NOTE — Procedures (Signed)
PROCEDURE SUMMARY:  Successful US guided paracentesis from LLQ.  Yielded 3.7 L of clear yellow fluid.  No immediate complications.  Pt tolerated well.   Specimen was sent for labs.  Ascencion Dike PA-C 06/26/2017 4:50 PM

## 2017-06-26 NOTE — ED Notes (Signed)
Attempted report 

## 2017-06-26 NOTE — ED Triage Notes (Signed)
Per Pt, Pt is coming from home with complaints of nausea and vomiting x 5 days. Pt reports having liver failure and having appt. to have fluid pulled off abdomen today at 1200. Pt reports taking diuretic and being unable to keep anything down.

## 2017-06-26 NOTE — ED Notes (Signed)
2 units of blood ready, per blood bank. Latricia Heft RN notified @ [phone].

## 2017-06-27 ENCOUNTER — Other Ambulatory Visit: Payer: Self-pay

## 2017-06-27 DIAGNOSIS — R195 Other fecal abnormalities: Secondary | ICD-10-CM

## 2017-06-27 DIAGNOSIS — K7031 Alcoholic cirrhosis of liver with ascites: Secondary | ICD-10-CM

## 2017-06-27 LAB — TYPE AND SCREEN
ABO/RH(D): A POS
ANTIBODY SCREEN: NEGATIVE
Unit division: 0
Unit division: 0

## 2017-06-27 LAB — CBC
HEMATOCRIT: 24.1 % — AB (ref 39.0–52.0)
HEMOGLOBIN: 8.2 g/dL — AB (ref 13.0–17.0)
MCH: 32 pg (ref 26.0–34.0)
MCHC: 34 g/dL (ref 30.0–36.0)
MCV: 94.1 fL (ref 78.0–100.0)
Platelets: 86 10*3/uL — ABNORMAL LOW (ref 150–400)
RBC: 2.56 MIL/uL — AB (ref 4.22–5.81)
RDW: 23.4 % — ABNORMAL HIGH (ref 11.5–15.5)
WBC: 6.4 10*3/uL (ref 4.0–10.5)

## 2017-06-27 LAB — HEMOGLOBIN AND HEMATOCRIT, BLOOD
HCT: 27.2 % — ABNORMAL LOW (ref 39.0–52.0)
HEMOGLOBIN: 8.9 g/dL — AB (ref 13.0–17.0)

## 2017-06-27 LAB — COMPREHENSIVE METABOLIC PANEL
ALK PHOS: 80 U/L (ref 38–126)
ALT: 29 U/L (ref 17–63)
AST: 57 U/L — ABNORMAL HIGH (ref 15–41)
Albumin: 2.4 g/dL — ABNORMAL LOW (ref 3.5–5.0)
Anion gap: 8 (ref 5–15)
BILIRUBIN TOTAL: 8.9 mg/dL — AB (ref 0.3–1.2)
BUN: 22 mg/dL — ABNORMAL HIGH (ref 6–20)
CALCIUM: 9.3 mg/dL (ref 8.9–10.3)
CO2: 26 mmol/L (ref 22–32)
CREATININE: 1.21 mg/dL (ref 0.61–1.24)
Chloride: 101 mmol/L (ref 101–111)
Glucose, Bld: 87 mg/dL (ref 65–99)
Potassium: 3.9 mmol/L (ref 3.5–5.1)
SODIUM: 135 mmol/L (ref 135–145)
TOTAL PROTEIN: 4.9 g/dL — AB (ref 6.5–8.1)

## 2017-06-27 LAB — BPAM RBC
BLOOD PRODUCT EXPIRATION DATE: 201808022359
Blood Product Expiration Date: 201808022359
ISSUE DATE / TIME: 201807172042
ISSUE DATE / TIME: 201807172327
UNIT TYPE AND RH: 6200
Unit Type and Rh: 6200

## 2017-06-27 LAB — PATHOLOGIST SMEAR REVIEW

## 2017-06-27 MED ORDER — PHYTONADIONE 5 MG PO TABS
10.0000 mg | ORAL_TABLET | Freq: Every day | ORAL | Status: DC
  Administered 2017-06-27 – 2017-06-29 (×3): 10 mg via ORAL
  Filled 2017-06-27 (×3): qty 2

## 2017-06-27 NOTE — Progress Notes (Signed)
Daily Rounding Note  06/27/2017, 8:32 AM  LOS: 1 day   SUBJECTIVE:   Chief complaint: no complaints.  Slept well, best he has in a few weeks.      3.7 liter paracentesis yesterday.  Feels much better.  Tolerated full liquids, pudding last PM.   Now NPO for thoracentesis today.      OBJECTIVE:         Vital signs in last 24 hours:    Temp:  [97.8 F (36.6 C)-98.5 F (36.9 C)] 98 F (36.7 C) (07/18 0759) Pulse Rate:  [84-100] 88 (07/18 0759) Resp:  [16-27] 19 (07/18 0759) BP: (103-144)/(45-78) 113/63 (07/18 0759) SpO2:  [88 %-99 %] 93 % (07/18 0759) Weight:  [80.9 kg (178 lb 4.8 oz)] 80.9 kg (178 lb 4.8 oz) (07/17 1702) Last BM Date: 06/26/17 Filed Weights   06/26/17 1702  Weight: 80.9 kg (178 lb 4.8 oz)   General: slightly jaundiced and icteric   Heart: RRR Chest: diminished BS on right, no dyspnea.   Abdomen: soft, NT, slightly distended  Extremities: minor pedal edema.   Neuro/Psych:  Oriented x 3.  No asterixis.  Fully alert and engaged  Intake/Output from previous day: 07/17 0701 - 07/18 0700 In: 1047.5 [P.O.:170; Blood:627.5; IV Piggyback:250] Out: -   Intake/Output this shift: No intake/output data recorded.  Lab Results:  Recent Labs  06/26/17 0939 06/26/17 1630 06/27/17 0710  WBC 8.9 8.3 6.4  HGB 6.9* 6.5* 8.2*  HCT 21.3* 20.1* 24.1*  PLT 114* 106* 86*   BMET  Recent Labs  06/26/17 0939  NA 136  K 4.3  CL 102  CO2 26  GLUCOSE 109*  BUN 24*  CREATININE 1.45*  CALCIUM 9.4   LFT  Recent Labs  06/26/17 0939  PROT 6.0*  ALBUMIN 2.2*  AST 71*  ALT 34  ALKPHOS 91  BILITOT 9.7*   PT/INR  Recent Labs  06/26/17 1135  LABPROT 24.9*  INR 2.22   Hepatitis Panel No results for input(s): HEPBSAG, HCVAB, HEPAIGM, HEPBIGM in the last 72 hours.  Studies/Results: Dg Abd Acute W/chest  Result Date: 06/26/2017 CLINICAL DATA:  Abdominal pain. EXAM: DG ABDOMEN ACUTE W/ 1V CHEST  COMPARISON:  06/14/2017 FINDINGS: Dema Severin out of the right chest. Patient has recurrent pleural effusion on the right based on prior studies. The left lung is clear. Normal visualized heart. There could be gas in the urinary bladder. Nonobstructive bowel gas pattern. No evidence of pneumoperitoneum. IMPRESSION: 1. White out of the right chest. Patient has recurrent right pleural effusion based on previous imaging. 2. Normal bowel gas pattern. 3. Question gas in the urinary bladder, has there been catheterization for sampling? Electronically Signed   By: Monte Fantasia M.D.   On: 06/26/2017 12:43   Ir Paracentesis  Result Date: 06/26/2017 INDICATION: Abdominal distention. History of cirrhosis. Recurrent ascites. Request diagnostic and therapeutic paracentesis. EXAM: ULTRASOUND GUIDED LEFT LOWER QUADRANT PARACENTESIS MEDICATIONS: None. COMPLICATIONS: None immediate. PROCEDURE: Informed written consent was obtained from the patient after a discussion of the risks, benefits and alternatives to treatment. A timeout was performed prior to the initiation of the procedure. Initial ultrasound scanning demonstrates a large amount of ascites within the left lower abdominal quadrant. The left lower abdomen was prepped and draped in the usual sterile fashion. 1% lidocaine with epinephrine was used for local anesthesia. Following this, a 19 gauge, 7-cm, Yueh catheter was introduced. An ultrasound image was saved for documentation purposes. The  paracentesis was performed. The catheter was removed and a dressing was applied. The patient tolerated the procedure well without immediate post procedural complication. FINDINGS: A total of approximately 3.7 L of clear yellow fluid was removed. Samples were sent to the laboratory as requested by the clinical team. IMPRESSION: Successful ultrasound-guided paracentesis yielding 3.7 liters of peritoneal fluid. Read by: Ascencion Dike PA-C Electronically Signed   By: Aletta Edouard M.D.    On: 06/26/2017 16:40   Scheduled Meds: . folic acid  1 mg Oral Daily  . furosemide  60 mg Oral Daily  . lactulose  20 g Oral BID  . pantoprazole  40 mg Oral BID  . phytonadione  1 mg Oral Once  . potassium chloride  10 mEq Oral Daily  . spironolactone  100 mg Oral Daily  . thiamine  100 mg Oral Daily   Continuous Infusions: . sodium chloride 75 mL/hr at 06/26/17 1705  . cefTRIAXone (ROCEPHIN)  IV Stopped (06/26/17 1412)   PRN Meds:.bismuth subsalicylate, calcium carbonate, ondansetron **OR** ondansetron (ZOFRAN) IV   ASSESMENT:   *  Ascites.  Accumulation of this leading to inability to take much PO.   3.7 liters removed, fluid not c/w with SBP (WBCs 19).  On daily Cipro for hx SBP and prophylaxis.  Currently on Rocephin.  Got Albumin 50 g afterwards.   Getting Lasix 60, aldactone 100 per home regimen.     *  Acute on chronic anemia in pt with chronic bleeding from portal gastropathy.  FOBT +.  Just had  Hgb improved after PRBC x 2. Can up diuretics but has hx of CKD/AKI so ? How he will tolerate.  TIPS relatively contraindicated given hx HE (on lactulose at home, no Rifaximin due to it caused swelling)  *  AKI.  Improved.    *  Right pleural effusion.  Recent thoracentesis 7/5.  For repeat today.    *  Coagulopathy.   PLAN   *  Wife wondering if routine, bimonthly paracentesis can be arranged?  Also wondering about bimonthly CBC and prn transfusion.  Will inquire with Dr Hilarie Fredrickson regarding scheduled taps, message sent.     *  gonna add Vitamin K and see if it helps coags.    *  CBC in AM.      Azucena Freed  06/27/2017, 8:32 AM Pager: [phone]

## 2017-06-27 NOTE — Progress Notes (Signed)
PROGRESS NOTE  Thomas [name] AYT:[id number] DOB: Oct 06, 1961 DOA: 06/26/2017 PCP: Alroy Dust, L.Marlou Sa, MD  HPI/Recap of past 77 hours: 56 year old male with past history of alcoholic cirrhosis with secondary esophageal varices, thrombocytopenia and hepatic encephalopathy, but has been sober for extended length of time, who has been currently attempting to get on transplant list at [facility].  Admitted on 7/17 for 5 days of nausea and vomiting unable tolerate food, worsening shortness of breath and black tarry stools. No hematemesis. In emergency room, patient found to have a hemoglobin of 6.5 and transfuse 2 units packed red blood cells. Evaluate by GI and patient underwent a paracentesis interventional radiology. Is also noted to have a pleural effusion which has been a recurrent problem for him to continue to his liver failure and is scheduled to undergo thoracentesis on 7/18. His last thoracentesis removed almost 2 L on 7/5.  Today, patient is doing well. Following blood transfusion, his hemoglobin today is up to 8.2. Patient himself has no complaints. Feels like his breathing is lot easier following paracentesis and was actually able to sleep last night.  Assessment/Plan: Active Problems:   Acute kidney injury ([place]): Likely prerenal from dehydration from nausea and vomiting plus poor by mouth intake and perhaps some mild blood loss. Responded to IV fluids. Creatinine down from 1.45 to 1.21   Thrombocytopenia [facility]): Monitoring closely. Secondary to chronic liver disease    Alcoholic cirrhosis of liver with ascites ([place]), end-stage: GI following. Not felt to be a TIPS candidate given history of encephalopathy. They will discuss with his transplant hepatologist. Continue lactulose.   Pleural effusion associated with hepatic disorder: GI suspects hepatic hydrothorax. For thoracentesis today and fluids to be sent off.    Hypotension: Secondary to nausea vomiting plus bleed. Improved with IV fluids and blood   Acute upper GI bleed was secondary symptomatically anemia/acute blood loss anemia: Baseline is around 10. Status post 2 units packed red blood cells. Received FFP and vitamin K. GI following. At this time they're holding off on endoscopy. In reviewing of his previous EGDs the fellow's varices and small cyst may not be variceal bleed. I suspect he may have more hepatic hydrothorax. Monitor hemoglobin, repeat this afternoon.   Code Status: Full code  Family Communication: Wife at bedside   Disposition Plan: Potential discharge tomorrow if hemoglobin remains stable.     Consultants: GI Interventional radiology.  Procedures:  For thoracentesis of left lung done 7/18  Status post paracentesis done 7/17: 3.7 L fluid removed.   Antibiotics:  On chronic Cipro for chronic SBP   DVT prophylaxis  SCDs   Objective: Vitals:   06/27/17 0155 06/27/17 0156 06/27/17 0759 06/27/17 1133  BP: 114/66 114/66 113/63 106/61  Pulse: 85 84 88 79  Resp: (!) 24 (!) 25 19 (!) 22  Temp: 98.2 F (36.8 C) 98.2 F (36.8 C) 98 F (36.7 C) 98.5 F (36.9 C)  TempSrc: Oral Oral Oral Oral  SpO2: 91% 92% 93% 95%  Weight:      Height:        Intake/Output Summary (Last 24 hours) at 06/27/17 1404 Last data filed at 06/27/17 0800  Gross per 24 hour  Intake          2166.25 ml  Output                0 ml  Net          2166.25 ml   Filed Weights   06/26/17 1702  Weight: 80.9  kg (178 lb 4.8 oz)    Exam:   General:  Alert and oriented 3, no acute distress  HEENT: Normocephalic, atraumatic, mucous membranes are slightly dry  Neck: Supple, no JVD   Cardiovascular: Regular rate and rhythm, K9-F8, 2/6 systolic ejection murmur   Respiratory: Decreased breath sounds on the left base   Abdomen: Soft, mild distention, nontender, hypoactive bowel sounds   Musculoskeletal: No clubbing cyanosis, 1+ pitting edema from knees down bilaterally   Skin: Skin breaks, tears or lesions  Psychiatry:  Patient is appropriate, no evidence of psychoses.  Neuro: No focal deficits    Data Reviewed: CBC:  Recent Labs Lab 06/26/17 0939 06/26/17 1630 06/27/17 0710  WBC 8.9 8.3 6.4  HGB 6.9* 6.5* 8.2*  HCT 21.3* 20.1* 24.1*  MCV 100.9* 101.5* 94.1  PLT 114* 106* 86*   Basic Metabolic Panel:  Recent Labs Lab 06/26/17 0939 06/27/17 0710  NA 136 135  K 4.3 3.9  CL 102 101  CO2 26 26  GLUCOSE 109* 87  BUN 24* 22*  CREATININE 1.45* 1.21  CALCIUM 9.4 9.3   GFR: Estimated Creatinine Clearance: 66.2 mL/min (by C-G formula based on SCr of 1.21 mg/dL). Liver Function Tests:  Recent Labs Lab 06/26/17 0939 06/27/17 0710  AST 71* 57*  ALT 34 29  ALKPHOS 91 80  BILITOT 9.7* 8.9*  PROT 6.0* 4.9*  ALBUMIN 2.2* 2.4*    Recent Labs Lab 06/26/17 0939  LIPASE 26    Recent Labs Lab 06/26/17 1135  AMMONIA 24   Coagulation Profile:  Recent Labs Lab 06/26/17 1135  INR 2.22   Cardiac Enzymes: No results for input(s): CKTOTAL, CKMB, CKMBINDEX, TROPONINI in the last 168 hours. BNP (last 3 results) No results for input(s): PROBNP in the last 8760 hours. HbA1C: No results for input(s): HGBA1C in the last 72 hours. CBG: No results for input(s): GLUCAP in the last 168 hours. Lipid Profile: No results for input(s): CHOL, HDL, LDLCALC, TRIG, CHOLHDL, LDLDIRECT in the last 72 hours. Thyroid Function Tests: No results for input(s): TSH, T4TOTAL, FREET4, T3FREE, THYROIDAB in the last 72 hours. Anemia Panel: No results for input(s): VITAMINB12, FOLATE, FERRITIN, TIBC, IRON, RETICCTPCT in the last 72 hours. Urine analysis:    Component Value Date/Time   COLORURINE AMBER (A) 06/26/2017 1329   APPEARANCEUR CLEAR 06/26/2017 1329   LABSPEC 1.016 06/26/2017 1329   PHURINE 5.0 06/26/2017 1329   GLUCOSEU NEGATIVE 06/26/2017 1329   HGBUR NEGATIVE 06/26/2017 1329   BILIRUBINUR NEGATIVE 06/26/2017 1329   KETONESUR NEGATIVE 06/26/2017 1329   PROTEINUR NEGATIVE 06/26/2017 1329    UROBILINOGEN 0.2 11/22/2012 0856   NITRITE NEGATIVE 06/26/2017 1329   LEUKOCYTESUR NEGATIVE 06/26/2017 1329   Sepsis Labs: @LABRCNTIP (procalcitonin:4,lacticidven:4)  ) Recent Results (from the past 240 hour(s))  MRSA PCR Screening     Status: None   Collection Time: 06/26/17  5:14 PM  Result Value Ref Range Status   MRSA by PCR NEGATIVE NEGATIVE Final    Comment:        The GeneXpert MRSA Assay (FDA approved for NASAL specimens only), is one component of a comprehensive MRSA colonization surveillance program. It is not intended to diagnose MRSA infection nor to guide or monitor treatment for MRSA infections.       Studies: Ir Paracentesis  Result Date: 06/26/2017 INDICATION: Abdominal distention. History of cirrhosis. Recurrent ascites. Request diagnostic and therapeutic paracentesis. EXAM: ULTRASOUND GUIDED LEFT LOWER QUADRANT PARACENTESIS MEDICATIONS: None. COMPLICATIONS: None immediate. PROCEDURE: Informed written consent was obtained from  the patient after a discussion of the risks, benefits and alternatives to treatment. A timeout was performed prior to the initiation of the procedure. Initial ultrasound scanning demonstrates a large amount of ascites within the left lower abdominal quadrant. The left lower abdomen was prepped and draped in the usual sterile fashion. 1% lidocaine with epinephrine was used for local anesthesia. Following this, a 19 gauge, 7-cm, Yueh catheter was introduced. An ultrasound image was saved for documentation purposes. The paracentesis was performed. The catheter was removed and a dressing was applied. The patient tolerated the procedure well without immediate post procedural complication. FINDINGS: A total of approximately 3.7 L of clear yellow fluid was removed. Samples were sent to the laboratory as requested by the clinical team. IMPRESSION: Successful ultrasound-guided paracentesis yielding 3.7 liters of peritoneal fluid. Read by: Ascencion Dike PA-C  Electronically Signed   By: Aletta Edouard M.D.   On: 06/26/2017 16:40    Scheduled Meds: . folic acid  1 mg Oral Daily  . furosemide  60 mg Oral Daily  . lactulose  20 g Oral BID  . pantoprazole  40 mg Oral BID  . phytonadione  10 mg Oral Daily  . potassium chloride  10 mEq Oral Daily  . spironolactone  100 mg Oral Daily  . thiamine  100 mg Oral Daily    Continuous Infusions: . sodium chloride 75 mL/hr at 06/27/17 1252  . cefTRIAXone (ROCEPHIN)  IV 1 g (06/27/17 1253)     LOS: 1 day     Annita Brod, MD [facility] Pager [phone]  If 7PM-7AM, please contact night-coverage [email or web address] Password [email or web address] 06/27/2017, 2:04 PM

## 2017-06-28 ENCOUNTER — Inpatient Hospital Stay (HOSPITAL_COMMUNITY): Payer: BLUE CROSS/BLUE SHIELD

## 2017-06-28 ENCOUNTER — Encounter (HOSPITAL_COMMUNITY): Payer: Self-pay | Admitting: General Surgery

## 2017-06-28 DIAGNOSIS — D62 Acute posthemorrhagic anemia: Principal | ICD-10-CM

## 2017-06-28 DIAGNOSIS — K922 Gastrointestinal hemorrhage, unspecified: Secondary | ICD-10-CM

## 2017-06-28 DIAGNOSIS — K7460 Unspecified cirrhosis of liver: Secondary | ICD-10-CM

## 2017-06-28 DIAGNOSIS — I8500 Esophageal varices without bleeding: Secondary | ICD-10-CM

## 2017-06-28 HISTORY — PX: IR THORACENTESIS ASP PLEURAL SPACE W/IMG GUIDE: IMG5380

## 2017-06-28 LAB — GRAM STAIN

## 2017-06-28 LAB — ALBUMIN, PLEURAL OR PERITONEAL FLUID: Albumin, Fluid: <1 g/dL

## 2017-06-28 LAB — PREPARE FRESH FROZEN PLASMA: UNIT DIVISION: 0

## 2017-06-28 LAB — BODY FLUID CELL COUNT WITH DIFFERENTIAL
EOS FL: 1 %
Lymphs, Fluid: 79 %
Monocyte-Macrophage-Serous Fluid: 11 % — ABNORMAL LOW (ref 50–90)
NEUTROPHIL FLUID: 9 % (ref 0–25)
Total Nucleated Cell Count, Fluid: 165 cu mm (ref 0–1000)

## 2017-06-28 LAB — CBC
HCT: 24.8 % — ABNORMAL LOW (ref 39.0–52.0)
Hemoglobin: 8.3 g/dL — ABNORMAL LOW (ref 13.0–17.0)
MCH: 31.6 pg (ref 26.0–34.0)
MCHC: 33.5 g/dL (ref 30.0–36.0)
MCV: 94.3 fL (ref 78.0–100.0)
PLATELETS: 84 10*3/uL — AB (ref 150–400)
RBC: 2.63 MIL/uL — ABNORMAL LOW (ref 4.22–5.81)
RDW: 23.7 % — AB (ref 11.5–15.5)
WBC: 6.5 10*3/uL (ref 4.0–10.5)

## 2017-06-28 LAB — BPAM FFP
Blood Product Expiration Date: 201807222359
UNIT TYPE AND RH: 6200

## 2017-06-28 LAB — LACTATE DEHYDROGENASE, PLEURAL OR PERITONEAL FLUID: LD, Fluid: 35 U/L — ABNORMAL HIGH (ref 3–23)

## 2017-06-28 LAB — PROTEIN, PLEURAL OR PERITONEAL FLUID

## 2017-06-28 MED ORDER — LIDOCAINE HCL (PF) 1 % IJ SOLN
INTRAMUSCULAR | Status: AC
  Filled 2017-06-28: qty 30

## 2017-06-28 MED ORDER — LIDOCAINE HCL 1 % IJ SOLN
INTRAMUSCULAR | Status: DC | PRN
  Administered 2017-06-28: 10 mL

## 2017-06-28 NOTE — Progress Notes (Signed)
Report given to [facility], RN on 3W; pt and spouse provided with an update regarding new bed assignment [phone].

## 2017-06-28 NOTE — Progress Notes (Addendum)
Patient ID: Thomas [name], male   DOB: 1961-08-31, 56 y.o.   MRN: [id number]  PROGRESS NOTE    Thomas [name]  HFW:[id number] DOB: 04/12/1961 DOA: 06/26/2017  PCP: Alroy Dust, L.Marlou Sa, MD   Brief Narrative:  56 year old male with alcoholic liver cirrhosis with esophageal varices, thrombocytopenia, hepatic encephalopathy, sober for lengthy period of time. On liver transplant list at [facility]. Pt admitted with N/V, shortness of breath abd black tarry stools, In ED, hgb was 6.5 and he received 2 U PRBC transfusion. He was seen by GI. Underwent paracentesis with 3.7 L fluid removed. He underwent thoracentesis 7/19 with 1.3 L fluid removed. Pt hypotensive after thoracentesis so will continue to observe for next 24 hours in hospital.    Assessment & Plan:   Active Problems:   Acute kidney injury (HCC) - Prerenal from dehydration and GI losses  - Cr now WNL    Thrombocytopenia (H[facility]) - Due to bone marrow suppression from alcoholic liver cirrhosis - No reports of bleeding     Alcoholic cirrhosis of liver with ascites ([facility]) / Pleural effusion associated with hepatic disorder  - S/p paracentesis with 3.7 L fluid removed - S/p thoracentesis this am with 1.3 L fluid removed     Hypotension - Secondary to N/V and this am due to thoracentesis - Continue to monitor on telemetry     Acute upper GI bleed / Acute blood loss anemia - Has received 2 U PRBC transfusion - Received FFP and vit K - Seen by GI    DVT prophylaxis: SCD's Code Status: partial code  Family Communication: wife at bedside  Disposition Plan: home in am if he feels better    Consultants:   IR  GI  Procedures:   Thoracentesis 7/19 - 1.3 l fluid removed   Antimicrobials:   None     Subjective: No overnight events.   Objective: Vitals:   06/28/17 0044 06/28/17 1045 06/28/17 1046 06/28/17 1121  BP: (!) 126/58 (!) 122/58 (!) 107/57 106/63  Pulse: 81     Resp: 20     Temp: 98.3 F (36.8 C)     TempSrc: Oral       SpO2: 97%     Weight:      Height:        Intake/Output Summary (Last 24 hours) at 06/28/17 1405 Last data filed at 06/28/17 0700  Gross per 24 hour  Intake             2220 ml  Output                0 ml  Net             2220 ml   Filed Weights   06/26/17 1702  Weight: 80.9 kg (178 lb 4.8 oz)    Examination:  General exam: Appears calm and comfortable  Respiratory system: Diminished on right side, no wheezing  Cardiovascular system: S1 & S2 heard, RRR. Gastrointestinal system: Abdomen is nondistended, soft and nontender. No organomegaly or masses felt. Normal bowel sounds heard. Central nervous system: Alert and oriented. No focal neurological deficits. Extremities: Symmetric 5 x 5 power. Skin: No rashes, lesions or ulcers Psychiatry: Judgement and insight appear normal. Mood & affect appropriate.   Data Reviewed: I have personally reviewed following labs and imaging studies  CBC:  Recent Labs Lab 06/26/17 0939 06/26/17 1630 06/27/17 0710 06/27/17 1836 06/28/17 0204  WBC 8.9 8.3 6.4  --  6.5  HGB 6.9* 6.5* 8.2* 8.9*  8.3*  HCT 21.3* 20.1* 24.1* 27.2* 24.8*  MCV 100.9* 101.5* 94.1  --  94.3  PLT 114* 106* 86*  --  84*   Basic Metabolic Panel:  Recent Labs Lab 06/26/17 0939 06/27/17 0710  NA 136 135  K 4.3 3.9  CL 102 101  CO2 26 26  GLUCOSE 109* 87  BUN 24* 22*  CREATININE 1.45* 1.21  CALCIUM 9.4 9.3   GFR: Estimated Creatinine Clearance: 66.2 mL/min (by C-G formula based on SCr of 1.21 mg/dL). Liver Function Tests:  Recent Labs Lab 06/26/17 0939 06/27/17 0710  AST 71* 57*  ALT 34 29  ALKPHOS 91 80  BILITOT 9.7* 8.9*  PROT 6.0* 4.9*  ALBUMIN 2.2* 2.4*    Recent Labs Lab 06/26/17 0939  LIPASE 26    Recent Labs Lab 06/26/17 1135  AMMONIA 24   Coagulation Profile:  Recent Labs Lab 06/26/17 1135  INR 2.22   Cardiac Enzymes: No results for input(s): CKTOTAL, CKMB, CKMBINDEX, TROPONINI in the last 168 hours. BNP (last 3  results) No results for input(s): PROBNP in the last 8760 hours. HbA1C: No results for input(s): HGBA1C in the last 72 hours. CBG: No results for input(s): GLUCAP in the last 168 hours. Lipid Profile: No results for input(s): CHOL, HDL, LDLCALC, TRIG, CHOLHDL, LDLDIRECT in the last 72 hours. Thyroid Function Tests: No results for input(s): TSH, T4TOTAL, FREET4, T3FREE, THYROIDAB in the last 72 hours. Anemia Panel: No results for input(s): VITAMINB12, FOLATE, FERRITIN, TIBC, IRON, RETICCTPCT in the last 72 hours. Urine analysis:    Component Value Date/Time   COLORURINE AMBER (A) 06/26/2017 1329   APPEARANCEUR CLEAR 06/26/2017 1329   LABSPEC 1.016 06/26/2017 1329   PHURINE 5.0 06/26/2017 1329   GLUCOSEU NEGATIVE 06/26/2017 1329   HGBUR NEGATIVE 06/26/2017 1329   BILIRUBINUR NEGATIVE 06/26/2017 1329   KETONESUR NEGATIVE 06/26/2017 1329   PROTEINUR NEGATIVE 06/26/2017 1329   UROBILINOGEN 0.2 11/22/2012 0856   NITRITE NEGATIVE 06/26/2017 1329   LEUKOCYTESUR NEGATIVE 06/26/2017 1329   Sepsis Labs: @LABRCNTIP (procalcitonin:4,lacticidven:4)    Recent Results (from the past 240 hour(s))  MRSA PCR Screening     Status: None   Collection Time: 06/26/17  5:14 PM  Result Value Ref Range Status   MRSA by PCR NEGATIVE NEGATIVE Final    Comment:        The GeneXpert MRSA Assay (FDA approved for NASAL specimens only), is one component of a comprehensive MRSA colonization surveillance program. It is not intended to diagnose MRSA infection nor to guide or monitor treatment for MRSA infections.       Radiology Studies: Dg Chest 1 View  Result Date: 06/28/2017 CLINICAL DATA:  Right-sided thoracentesis EXAM: CHEST 1 VIEW COMPARISON:  None. FINDINGS: Moderate right pleural effusion. No left pleural effusion. No focal consolidation. Bilateral mild thickening. No pneumothorax. Stable cardiomediastinal silhouette. No acute osseous abnormality. IMPRESSION: Moderate right pleural  effusion.  No pneumothorax. Electronically Signed   By: Kathreen Devoid   On: 06/28/2017 11:57   Dg Abd Acute W/chest  Result Date: 06/26/2017 CLINICAL DATA:  Abdominal pain. EXAM: DG ABDOMEN ACUTE W/ 1V CHEST COMPARISON:  06/14/2017 FINDINGS: Dema Severin out of the right chest. Patient has recurrent pleural effusion on the right based on prior studies. The left lung is clear. Normal visualized heart. There could be gas in the urinary bladder. Nonobstructive bowel gas pattern. No evidence of pneumoperitoneum. IMPRESSION: 1. White out of the right chest. Patient has recurrent right pleural effusion based on previous imaging. 2.  Normal bowel gas pattern. 3. Question gas in the urinary bladder, has there been catheterization for sampling? Electronically Signed   By: Monte Fantasia M.D.   On: 06/26/2017 12:43   Ir Paracentesis  Result Date: 06/26/2017 INDICATION: Abdominal distention. History of cirrhosis. Recurrent ascites. Request diagnostic and therapeutic paracentesis. EXAM: ULTRASOUND GUIDED LEFT LOWER QUADRANT PARACENTESIS MEDICATIONS: None. COMPLICATIONS: None immediate. PROCEDURE: Informed written consent was obtained from the patient after a discussion of the risks, benefits and alternatives to treatment. A timeout was performed prior to the initiation of the procedure. Initial ultrasound scanning demonstrates a large amount of ascites within the left lower abdominal quadrant. The left lower abdomen was prepped and draped in the usual sterile fashion. 1% lidocaine with epinephrine was used for local anesthesia. Following this, a 19 gauge, 7-cm, Yueh catheter was introduced. An ultrasound image was saved for documentation purposes. The paracentesis was performed. The catheter was removed and a dressing was applied. The patient tolerated the procedure well without immediate post procedural complication. FINDINGS: A total of approximately 3.7 L of clear yellow fluid was removed. Samples were sent to the laboratory  as requested by the clinical team. IMPRESSION: Successful ultrasound-guided paracentesis yielding 3.7 liters of peritoneal fluid. Read by: Ascencion Dike PA-C Electronically Signed   By: Aletta Edouard M.D.   On: 06/26/2017 16:40        Scheduled Meds: . folic acid  1 mg Oral Daily  . furosemide  60 mg Oral Daily  . lactulose  20 g Oral BID  . lidocaine (PF)      . pantoprazole  40 mg Oral BID  . phytonadione  10 mg Oral Daily  . potassium chloride  10 mEq Oral Daily  . spironolactone  100 mg Oral Daily  . thiamine  100 mg Oral Daily   Continuous Infusions: . sodium chloride 75 mL/hr at 06/28/17 0054  . cefTRIAXone (ROCEPHIN)  IV Stopped (06/27/17 1323)     LOS: 2 days    Time spent: 25 minutes  Greater than 50% of the time spent on counseling and coordinating the care.   Leisa Lenz, MD [facility] Pager [phone]  If 7PM-7AM, please contact night-coverage [email or web address] Password [email or web address] 06/28/2017, 2:05 PM

## 2017-06-28 NOTE — Progress Notes (Signed)
     Thomas [name] Progress Note  Chief Complaint:    ascites  Subjective: feels better today after recent paracentesis. Eating sherbert. Awaiting thoracentesis   Objective:  Vital signs in last 24 hours: Temp:  [98.2 F (36.8 C)-98.5 F (36.9 C)] 98.3 F (36.8 C) (07/19 0044) Pulse Rate:  [79-81] 81 (07/19 0044) Resp:  [20-28] 20 (07/19 0044) BP: (105-126)/(58-69) 126/58 (07/19 0044) SpO2:  [95 %-99 %] 97 % (07/19 0044) Last BM Date: 06/26/17 General:   Alert, well-developed, white male in NAD EENT:  Normal hearing, icteric sclera Heart:  Regular rate and rhythm, 2+ bilateral lower extremity edema Pulm: Normal respiratory effort, decreased breath sounds right lung Abdomen:  Soft, mild-mod distended, nontender.  Normal bowel sounds Neurologic:  Alert and  oriented x4;  grossly normal neurologically. Psych:  Pleasant, cooperative.  Normal mood and affect.   Intake/Output from previous day: 07/18 0701 - 07/19 0700 In: 2821.3 [P.O.:720; I.V.:2101.3] Out: -  Intake/Output this shift: No intake/output data recorded.  Lab Results:  Recent Labs  06/26/17 1630 06/27/17 0710 06/27/17 1836 06/28/17 0204  WBC 8.3 6.4  --  6.5  HGB 6.5* 8.2* 8.9* 8.3*  HCT 20.1* 24.1* 27.2* 24.8*  PLT 106* 86*  --  84*   BMET  Recent Labs  06/26/17 0939 06/27/17 0710  NA 136 135  K 4.3 3.9  CL 102 101  CO2 26 26  GLUCOSE 109* 87  BUN 24* 22*  CREATININE 1.45* 1.21  CALCIUM 9.4 9.3   LFT  Recent Labs  06/27/17 0710  PROT 4.9*  ALBUMIN 2.4*  AST 57*  ALT 29  ALKPHOS 80  BILITOT 8.9*   PT/INR  Recent Labs  06/26/17 1135  LABPROT 24.9*  INR 2.22     ASSESSMENT / PLAN:    Decompensated cirrhosis with recurring ascites / large right pleural effusion which 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   He is s/p LVP with 3.7 liters off two days ago and 3.5 liters were removed on 06/11/17.  He had a thoracentesis  On 7/5 with 1.9 liters removed but right lung is full on  recent CXR. Not felt to be TIPS candidate because of his history of encephalopathy.   -continue home dose of lasix 60mg  and aldactone 100 mg daily. He gets AKI easily.  -2 gram sodium diet -continue lactulose -He is undergoing transplant evaluation at [facility]. Suppose to hear from [place] today as whether he will be placed on list (pending completion of [place] disease evaluation)  Worsening right pleural effusion. He had 1.9 liters removed on 7/5. For another thoracentesis today.  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 Esophageal varices small, no history of hemorrhage. He has required multiple transfusions. Hgb currently stable at 8.3.   AKI, improving.    Active Problems:   Acute kidney injury (HCC)   Thrombocytopenia (HCC)   Esophageal varices (HCC)   Acute blood loss anemia   End stage liver disease (HCC)   Alcoholic cirrhosis of liver with ascites (HCC)   Pleural effusion associated with hepatic disorder   Symptomatic anemia   Hypotension   Acute upper GI bleed    LOS: 2 days   Thomas [name] ,NP 06/28/2017, 9:37 AM  Pager number [phone]

## 2017-06-28 NOTE — Procedures (Addendum)
Ultrasound-guided diagnostic and therapeutic right thoracentesis performed yielding 1.3liters of serosanguineous colored fluid. No immediate complications. Follow-up chest x-ray pending.  Procedure was terminated early secondary to hypotension and coughing.      Karolyn Messing E 11:49 AM 06/28/2017

## 2017-06-29 ENCOUNTER — Telehealth: Payer: Self-pay

## 2017-06-29 ENCOUNTER — Other Ambulatory Visit: Payer: Self-pay | Admitting: Gastroenterology

## 2017-06-29 ENCOUNTER — Other Ambulatory Visit: Payer: Self-pay

## 2017-06-29 DIAGNOSIS — K3189 Other diseases of stomach and duodenum: Secondary | ICD-10-CM

## 2017-06-29 DIAGNOSIS — N179 Acute kidney failure, unspecified: Secondary | ICD-10-CM

## 2017-06-29 DIAGNOSIS — K7031 Alcoholic cirrhosis of liver with ascites: Secondary | ICD-10-CM

## 2017-06-29 DIAGNOSIS — K2920 Alcoholic gastritis without bleeding: Secondary | ICD-10-CM

## 2017-06-29 LAB — BASIC METABOLIC PANEL
ANION GAP: 5 (ref 5–15)
BUN: 12 mg/dL (ref 6–20)
CALCIUM: 8.4 mg/dL — AB (ref 8.9–10.3)
CO2: 26 mmol/L (ref 22–32)
Chloride: 103 mmol/L (ref 101–111)
Creatinine, Ser: 1.2 mg/dL (ref 0.61–1.24)
GFR calc Af Amer: >60 mL/min (ref >60)
Glucose, Bld: 110 mg/dL — ABNORMAL HIGH (ref 65–99)
POTASSIUM: 3.5 mmol/L (ref 3.5–5.1)
SODIUM: 134 mmol/L — AB (ref 135–145)

## 2017-06-29 LAB — CBC
HCT: 25.7 % — ABNORMAL LOW (ref 39.0–52.0)
Hemoglobin: 8.6 g/dL — ABNORMAL LOW (ref 13.0–17.0)
MCH: 32 pg (ref 26.0–34.0)
MCHC: 33.5 g/dL (ref 30.0–36.0)
MCV: 95.5 fL (ref 78.0–100.0)
PLATELETS: 76 10*3/uL — AB (ref 150–400)
RBC: 2.69 MIL/uL — AB (ref 4.22–5.81)
RDW: 23.3 % — ABNORMAL HIGH (ref 11.5–15.5)
WBC: 7.1 10*3/uL (ref 4.0–10.5)

## 2017-06-29 LAB — PATHOLOGIST SMEAR REVIEW

## 2017-06-29 MED ORDER — SPIRONOLACTONE 100 MG PO TABS: 150.0000 mg | ORAL_TABLET | Freq: Every day | ORAL | 0 refills | Status: DC

## 2017-06-29 MED ORDER — CIPROFLOXACIN HCL 500 MG PO TABS: 500.0000 mg | ORAL_TABLET | Freq: Every evening | ORAL | 0 refills | Status: DC

## 2017-06-29 MED ORDER — THIAMINE HCL 100 MG PO TABS: 100.0000 mg | ORAL_TABLET | Freq: Every day | ORAL | 0 refills | Status: DC

## 2017-06-29 MED ORDER — SUCRALFATE 1 G PO TABS
1.0000 g | ORAL_TABLET | Freq: Four times a day (QID) | ORAL | 1 refills | Status: DC
Start: 2017-06-29 — End: 2017-08-23

## 2017-06-29 MED ORDER — FUROSEMIDE 20 MG PO TABS: 40.0000 mg | ORAL_TABLET | Freq: Two times a day (BID) | ORAL | 0 refills | Status: DC

## 2017-06-29 NOTE — Progress Notes (Signed)
SATURATION QUALIFICATIONS: (This note is used to comply with regulatory documentation for home oxygen)  Patient Saturations on Room Air at Rest = 100%  Patient Saturations on Room Air while Ambulating = 95%  Patient Saturations on NA Liters of oxygen while Ambulating = NA%  Please briefly explain why patient needs home oxygen: The patient doesn't require oxygen.   Saddie Benders RN

## 2017-06-29 NOTE — Progress Notes (Signed)
Progress Note   Subjective  Patient feeling well today. He had a thoracentesis yesterday, breathing better. Wants to go home.    Objective   Vital signs in last 24 hours: Temp:  [98.4 F (36.9 C)-98.7 F (37.1 C)] 98.7 F (37.1 C) (07/20 0500) Pulse Rate:  [78-81] 79 (07/20 0500) Resp:  [17-18] 17 (07/20 0500) BP: (103-122)/(57-65) 111/65 (07/20 0500) SpO2:  [95 %-97 %] 95 % (07/20 0500) Weight:  [181 lb (82.1 kg)] 181 lb (82.1 kg) (07/20 0500) Last BM Date: 06/27/17 General:    white male in NAD Heart:  Regular rate and rhythm; no murmurs Lungs: Respirations even and unlabored, lungs clear on left, has right sided decreased BS as bases Abdomen:  Soft, nontender, mildly distended.  Extremities:  1 (+) edema. Neurologic:  Alert and oriented,  grossly normal neurologically. Psych:  Cooperative. Normal mood and affect.  Intake/Output from previous day: 07/19 0701 - 07/20 0700 In: 1925 [P.O.:240; I.V.:1585; IV Piggyback:100] Out: -  Intake/Output this shift: No intake/output data recorded.  Lab Results:  Recent Labs  06/27/17 0710 06/27/17 1836 06/28/17 0204 06/29/17 0357  WBC 6.4  --  6.5 7.1  HGB 8.2* 8.9* 8.3* 8.6*  HCT 24.1* 27.2* 24.8* 25.7*  PLT 86*  --  84* 76*   BMET  Recent Labs  06/26/17 0939 06/27/17 0710 06/29/17 0357  NA 136 135 134*  K 4.3 3.9 3.5  CL 102 101 103  CO2 26 26 26   GLUCOSE 109* 87 110*  BUN 24* 22* 12  CREATININE 1.45* 1.21 1.20  CALCIUM 9.4 9.3 8.4*   LFT  Recent Labs  06/27/17 0710  PROT 4.9*  ALBUMIN 2.4*  AST 57*  ALT 29  ALKPHOS 80  BILITOT 8.9*   PT/INR  Recent Labs  06/26/17 1135  LABPROT 24.9*  INR 2.22    Studies/Results: Dg Chest 1 View  Result Date: 06/28/2017 CLINICAL DATA:  Right-sided thoracentesis EXAM: CHEST 1 VIEW COMPARISON:  None. FINDINGS: Moderate right pleural effusion. No left pleural effusion. No focal consolidation. Bilateral mild thickening. No pneumothorax. Stable  cardiomediastinal silhouette. No acute osseous abnormality. IMPRESSION: Moderate right pleural effusion.  No pneumothorax. Electronically Signed   By: Kathreen Devoid   On: 06/28/2017 11:57   Ir Thoracentesis Asp Pleural Space W/img Guide  Result Date: 06/28/2017 INDICATION: Patient with a history of alcoholic cirrhosis with recurrent right pleural effusion. Request is made for diagnostic and therapeutic thoracentesis. EXAM: ULTRASOUND GUIDED DIAGNOSTIC AND THERAPEUTIC THORACENTESIS MEDICATIONS: 1% lidocaine COMPLICATIONS: None immediate. PROCEDURE: An ultrasound guided thoracentesis was thoroughly discussed with the patient and questions answered. The benefits, risks, alternatives and complications were also discussed. The patient understands and wishes to proceed with the procedure. Written consent was obtained. Ultrasound was performed to localize and Linley an adequate pocket of fluid in the right chest. The area was then prepped and draped in the normal sterile fashion. 1% Lidocaine was used for local anesthesia. Under ultrasound guidance a Safe-T-Centesis catheter was introduced. Thoracentesis was performed. The catheter was removed and a dressing applied. FINDINGS: A total of approximately 1.3 L of serosanguineous fluid was removed. Samples were sent to the laboratory as requested by the clinical team. IMPRESSION: Successful ultrasound guided right thoracentesis yielding 1.3 L of pleural fluid. The procedure was terminated prior to all fluid being remove secondary to hypotension as well as coughing. Read by: Saverio Danker, PA-C Electronically Signed   By: Marybelle Killings M.D.   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  He's feeling better s/p  paracentesis and thoracentesis. Wants to go home today. I think he needs to be ambulated and check oxygen saturation to ensure he does not need supplemental oxygen. [facility] contacted him, once he completes alcohol counseling he will be a candidate for transplant, likely not until October time frame. 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 He will need close outpatient follow up with Dr. Hilarie Fredrickson with frequent monitoring of labs when he goes home.   Recommend at this time: - ambulate and monitor oxygen sats, if oxygen sats okay with ambulation, I think he can go home today - slowly increase diuretics - I would increase lasix to 40mg  BID and aldactone to 150mg  q day at this time, plan for labs - CBC and BMET Monday of next week if he leaves today - continue ciprofloxacin as outpatient for SBP prophylaxis - continue lactulose - continue protonix and carafate - alcohol counseling as outpatient - holding off on beta blocker in regards to varices given renal issues and ascites - may need another paracentesis as outpatient within 2 weeks  Call with questions.  Eden Cellar, MD [facility] Pager [phone]

## 2017-06-29 NOTE — Telephone Encounter (Signed)
Labs are in [place]. I have left a message for patient to call back to remind him to get labs done on Monday. Will also let him know that at his appointment next week will give him information about counseling centers and discuss next paracentesis. Dottie aware and will follow up with patient.

## 2017-06-29 NOTE — Discharge Summary (Addendum)
Physician Discharge Summary  Thomas [name] SAY:[id number] DOB: 26-Oct-1961 DOA: 06/26/2017  PCP: Alroy Dust, L.Marlou Sa, MD  Admit date: 06/26/2017 Discharge date: 06/29/2017  Recommendations for Outpatient Follow-up:  1. Follow up at [facility] per sch appt 2. Cipro on discharge for 10 days for SBP prophylaxis   Lasix changed to 40 mg BID and aldactone to 150 mg daily    Discharge Diagnoses:  Active Problems:   Acute kidney injury (HCC)   Thrombocytopenia (HCC)   Esophageal varices (HCC)   Acute blood loss anemia   End stage liver disease (HCC)   Alcoholic cirrhosis of liver with ascites (HCC)   Pleural effusion associated with hepatic disorder   Symptomatic anemia   Hypotension   Acute upper GI bleed    Discharge Condition: stable   Diet recommendation: as tolerated   History of present illness:  56 year old male with alcoholic liver cirrhosis with esophageal varices, thrombocytopenia, hepatic encephalopathy, sober for lengthy period of time. On liver transplant list at [facility]. Pt admitted with N/V, shortness of breath abd black tarry stools, In ED, hgb was 6.5 and he received 2 U PRBC transfusion. He was seen by GI. Underwent paracentesis with 3.7 L fluid removed. He underwent thoracentesis 7/19 with 1.3 L fluid removed.   Hospital Course:  Active Problems:   Acute kidney injury (HCC) - Prerenal from dehydration and GI losses  - Cr WNL    Thrombocytopenia (HCC) - Due to bone marrow suppression from alcoholic liver cirrhosis - No bleeding     Alcoholic cirrhosis of liver with ascites (HCC) / Pleural effusion associated with hepatic disorder  - S/p paracentesis with 3.7 L fluid removed - S/p thoracentesis this am with 1.3 L fluid removed  - Continue lasix 40 mg BID and aldactone 150 mg daily - Continue PPI therapy and Sucralfate     Hypotension - Yesterday hypotensive after thoracentesis - BP stable this am    Acute upper GI bleed / Acute blood loss anemia - Has received 2 U  PRBC transfusion - Received FFP and vit K - Seen by GI   DVT prophylaxis: SCD's Code Status: partial code  Family Communication: wife at bedside   Consultants:   IR  GI  Procedures:   Thoracentesis 7/19 - 1.3 l fluid removed   Antimicrobials:   None     Signed:  Leisa Lenz, MD  [facility] 06/29/2017, 8:48 AM  Pager #: [phone]  Time spent in minutes: more than 30 minutes   Discharge Exam: Vitals:   06/28/17 2034 06/29/17 0500  BP: 103/62 111/65  Pulse: 81 79  Resp: 18 17  Temp: 98.4 F (36.9 C) 98.7 F (37.1 C)   Vitals:   06/28/17 1121 06/28/17 1426 06/28/17 2034 06/29/17 0500  BP: 106/63 113/65 103/62 111/65  Pulse:  78 81 79  Resp:  18 18 17   Temp:  98.5 F (36.9 C) 98.4 F (36.9 C) 98.7 F (37.1 C)  TempSrc:  Oral Oral Oral  SpO2:  97% 97% 95%  Weight:    82.1 kg (181 lb)  Height:        General: Pt is alert, follows commands appropriately, not in acute distress Cardiovascular: Regular rate and rhythm, S1/S2 + Respiratory: diminished on right breath sounds, no wheezing  Abdominal: distended, non tender, (+) BS Extremities: no edema, no cyanosis, pulses palpable bilaterally DP and PT Neuro: Grossly nonfocal  Discharge Instructions  Discharge Instructions    Call MD for:  difficulty breathing, headache or visual  disturbances    Complete by:  As directed    Call MD for:  redness, tenderness, or signs of infection (pain, swelling, redness, odor or green/yellow discharge around incision site)    Complete by:  As directed    Call MD for:  severe uncontrolled pain    Complete by:  As directed    Diet - low sodium heart healthy    Complete by:  As directed    Discharge instructions    Complete by:  As directed    Continue Cipro for 10 more days on discharge   Increase activity slowly    Complete by:  As directed      Allergies as of 06/29/2017      Reactions   Rifaximin Swelling, Other (See Comments)   Reaction:  All  over body swelling       Medication List    TAKE these medications   bismuth subsalicylate 035 MG chewable tablet Commonly known as:  PEPTO BISMOL Chew 262-524 mg by mouth 3 (three) times daily as needed for indigestion.   calcium carbonate 750 MG chewable tablet Commonly known as:  TUMS EX Chew 1-2 tablets by mouth 3 (three) times daily as needed for heartburn.   calcium-vitamin D 500-200 MG-UNIT tablet Commonly known as:  OSCAL WITH D Take 1 tablet by mouth 2 (two) times daily.   ciprofloxacin 500 MG tablet Commonly known as:  CIPRO Take 1 tablet (500 mg total) by mouth every evening.   ergocalciferol 50000 units capsule Commonly known as:  VITAMIN D2 Take 50,000 Units by mouth once a week.   ferrous sulfate 325 (65 FE) MG tablet Take 1 tablet (325 mg total) by mouth 3 (three) times daily with meals. What changed:  when to take this   folic acid 1 MG tablet Commonly known as:  FOLVITE Take 1 tablet (1 mg total) by mouth at bedtime. What changed:  when to take this   furosemide 20 MG tablet Commonly known as:  LASIX Take 2 tablets (40 mg total) by mouth 2 (two) times daily. What changed:  medication strength  how much to take  when to take this   lactulose 10 GM/15ML solution Commonly known as:  CHRONULAC Take 30 mLs (20 g total) by mouth 2 (two) times daily.   pantoprazole 40 MG tablet Commonly known as:  PROTONIX TAKE 1 TABLET (40 MG TOTAL) BY MOUTH 2 (TWO) TIMES DAILY.   potassium chloride 10 MEQ tablet Commonly known as:  K-DUR,KLOR-CON Take 1 tablet (10 mEq total) by mouth daily.   spironolactone 100 MG tablet Commonly known as:  ALDACTONE Take 1.5 tablets (150 mg total) by mouth daily. What changed:  how much to take   sucralfate 1 g tablet Commonly known as:  CARAFATE Take 1 tablet (1 g total) by mouth 4 (four) times daily.   thiamine 100 MG tablet Take 1 tablet (100 mg total) by mouth daily.   TUSSIN COUGH PO Take 2 capsules by mouth  daily as needed (cough).       Follow-up Information    Alroy Dust, L.Marlou Sa, MD. Schedule an appointment as soon as possible for a visit in 2 week(s).   Specialty:  Family Medicine Contact information: [address] [place] [address] [phone]            The results of significant diagnostics from this hospitalization (including imaging, microbiology, ancillary and laboratory) are listed below for reference.    Significant Diagnostic Studies:  Dg Chest 1 View  Result Date: 06/28/2017 CLINICAL DATA:  Right-sided thoracentesis EXAM: CHEST 1 VIEW COMPARISON:  None. FINDINGS: Moderate right pleural effusion. No left pleural effusion. No focal consolidation. Bilateral mild thickening. No pneumothorax. Stable cardiomediastinal silhouette. No acute osseous abnormality. IMPRESSION: Moderate right pleural effusion.  No pneumothorax. Electronically Signed   By: Kathreen Devoid   On: 06/28/2017 11:57   Dg Chest 2 View  Result Date: 06/13/2017 CLINICAL DATA:  Cough and shortness of breath for 1 month. Weakness. EXAM: CHEST  2 VIEW COMPARISON:  Most recent comparison radiograph 06/01/2017. Chest CT 11/24/2016 FINDINGS: Progressive large right pleural effusion now occupying the lower 2/3 of the right hemithorax. Associated atelectasis without leftward mediastinal shift. Unchanged mediastinal contours, right heart borders obscured by pleural fluid. No pulmonary edema. No pneumothorax. Stable osseous structures. IMPRESSION: Increased right pleural effusion, now large occupying the lower 2/3 of the right hemithorax. Likely associated atelectasis given lack of leftward mediastinal shift. Electronically Signed   By: Jeb Levering M.D.   On: 06/13/2017 18:31   Dg Chest 2 View  Result Date: 06/01/2017 CLINICAL DATA:  Cough EXAM: CHEST  2 VIEW COMPARISON:  04/27/2017 chest radiograph. FINDINGS: Stable cardiomediastinal silhouette with normal heart size. No pneumothorax. Moderate right  pleural effusion, increased. No left pleural effusion. Worsening volume loss and consolidation at the right lung base. IMPRESSION: 1. Moderate right pleural effusion, increased. 2. Worsening volume loss and consolidation at the right lung base, likely representing atelectasis, with a component of pneumonia or underlying mass difficult to exclude. Chest CT with IV contrast may be considered as clinically warranted. Electronically Signed   By: Ilona Sorrel M.D.   On: 06/01/2017 14:53   Dg Chest Port 1 View  Result Date: 06/14/2017 CLINICAL DATA:  Status post right-sided thoracentesis EXAM: PORTABLE CHEST 1 VIEW COMPARISON:  Chest x-ray of June 13, 2017 FINDINGS: There has been marked reduction in the volume of pleural fluid on the right. The right lung there is better inflated. There is no postprocedure pneumothorax. The left lung is adequately inflated. There is prominence of the pulmonary vascularity bilaterally. The heart is top-normal in size. The mediastinum is normal in width. The bony thorax exhibits no acute abnormality. IMPRESSION: Improved aeration of the right lung status post thoracentesis. No postprocedure complication. Electronically Signed   By: David  Martinique M.D.   On: 06/14/2017 15:49   Dg Abd Acute W/chest  Result Date: 06/26/2017 CLINICAL DATA:  Abdominal pain. EXAM: DG ABDOMEN ACUTE W/ 1V CHEST COMPARISON:  06/14/2017 FINDINGS: Dema Severin out of the right chest. Patient has recurrent pleural effusion on the right based on prior studies. The left lung is clear. Normal visualized heart. There could be gas in the urinary bladder. Nonobstructive bowel gas pattern. No evidence of pneumoperitoneum. IMPRESSION: 1. White out of the right chest. Patient has recurrent right pleural effusion based on previous imaging. 2. Normal bowel gas pattern. 3. Question gas in the urinary bladder, has there been catheterization for sampling? Electronically Signed   By: Monte Fantasia M.D.   On: 06/26/2017 12:43   Ir  Paracentesis  Result Date: 06/26/2017 INDICATION: Abdominal distention. History of cirrhosis. Recurrent ascites. Request diagnostic and therapeutic paracentesis. EXAM: ULTRASOUND GUIDED LEFT LOWER QUADRANT PARACENTESIS MEDICATIONS: None. COMPLICATIONS: None immediate. PROCEDURE: Informed written consent was obtained from the patient after a discussion of the risks, benefits and alternatives to treatment. A timeout was performed prior to the initiation of the procedure. Initial ultrasound scanning demonstrates a large amount of ascites  within the left lower abdominal quadrant. The left lower abdomen was prepped and draped in the usual sterile fashion. 1% lidocaine with epinephrine was used for local anesthesia. Following this, a 19 gauge, 7-cm, Yueh catheter was introduced. An ultrasound image was saved for documentation purposes. The paracentesis was performed. The catheter was removed and a dressing was applied. The patient tolerated the procedure well without immediate post procedural complication. FINDINGS: A total of approximately 3.7 L of clear yellow fluid was removed. Samples were sent to the laboratory as requested by the clinical team. IMPRESSION: Successful ultrasound-guided paracentesis yielding 3.7 liters of peritoneal fluid. Read by: Ascencion Dike PA-C Electronically Signed   By: Aletta Edouard M.D.   On: 06/26/2017 16:40   Ir Paracentesis  Result Date: 06/11/2017 INDICATION: Patient with recurrent ascites. Request is made for therapeutic paracentesis. EXAM: ULTRASOUND GUIDED THERAPEUTIC PARACENTESIS MEDICATIONS: 10 mL 1% lidocaine COMPLICATIONS: None immediate. PROCEDURE: Informed written consent was obtained from the patient after a discussion of the risks, benefits and alternatives to treatment. A timeout was performed prior to the initiation of the procedure. Initial ultrasound scanning demonstrates a large amount of ascites within the left lateral abdomen. The left lateral abdomen was prepped  and draped in the usual sterile fashion. 1% lidocaine was used for local anesthesia. Following this, a 19 gauge, 7-cm, Yueh catheter was introduced. An ultrasound image was saved for documentation purposes. The paracentesis was performed. The catheter was removed and a dressing was applied. The patient tolerated the procedure well without immediate post procedural complication. FINDINGS: A total of approximately 3.5 liters of yellow fluid was removed. IMPRESSION: Successful ultrasound-guided therapeutic paracentesis yielding 3.5 liters of peritoneal fluid. Read by:  Brynda Greathouse PA-C Electronically Signed   By: Lucrezia Europe M.D.   On: 06/11/2017 15:40   Ir Thoracentesis Asp Pleural Space W/img Guide  Result Date: 06/28/2017 INDICATION: Patient with a history of alcoholic cirrhosis with recurrent right pleural effusion. Request is made for diagnostic and therapeutic thoracentesis. EXAM: ULTRASOUND GUIDED DIAGNOSTIC AND THERAPEUTIC THORACENTESIS MEDICATIONS: 1% lidocaine COMPLICATIONS: None immediate. PROCEDURE: An ultrasound guided thoracentesis was thoroughly discussed with the patient and questions answered. The benefits, risks, alternatives and complications were also discussed. The patient understands and wishes to proceed with the procedure. Written consent was obtained. Ultrasound was performed to localize and Spero an adequate pocket of fluid in the right chest. The area was then prepped and draped in the normal sterile fashion. 1% Lidocaine was used for local anesthesia. Under ultrasound guidance a Safe-T-Centesis catheter was introduced. Thoracentesis was performed. The catheter was removed and a dressing applied. FINDINGS: A total of approximately 1.3 L of serosanguineous fluid was removed. Samples were sent to the laboratory as requested by the clinical team. IMPRESSION: Successful ultrasound guided right thoracentesis yielding 1.3 L of pleural fluid. The procedure was terminated prior to all fluid  being remove secondary to hypotension as well as coughing. Read by: Saverio Danker, PA-C Electronically Signed   By: Marybelle Killings M.D.   On: 06/28/2017 11:56   US Thoracentesis Asp Pleural Space W/img Guide  Result Date: 06/15/2017 INDICATION: Patient with history of alcoholic cirrhosis, ascites, recurrent right pleural effusion, dyspnea. Request made for diagnostic and therapeutic right thoracentesis. EXAM: ULTRASOUND GUIDED DIAGNOSTIC AND THERAPEUTIC RIGHT THORACENTESIS MEDICATIONS: None. COMPLICATIONS: None immediate. PROCEDURE: An ultrasound guided thoracentesis was thoroughly discussed with the patient and questions answered. The benefits, risks, alternatives and complications were also discussed. The patient understands and wishes to proceed with the procedure. Written consent  was obtained. Ultrasound was performed to localize and Mattis an adequate pocket of fluid in the right chest. The area was then prepped and draped in the normal sterile fashion. 1% Lidocaine was used for local anesthesia. Under ultrasound guidance a Safe-T-Centesis catheter was introduced. Thoracentesis was performed. The catheter was removed and a dressing applied. FINDINGS: A total of approximately 1.9 liters of blood-tinged, amber fluid was removed. Samples were sent to the laboratory as requested by the clinical team. IMPRESSION: Successful ultrasound guided diagnostic and therapeutic right thoracentesis yielding 1.9 liters of pleural fluid. Read by: Rowe Robert, PA-C Electronically Signed   By: Jacqulynn Cadet M.D.   On: 06/14/2017 16:00    Microbiology: Recent Results (from the past 240 hour(s))  MRSA PCR Screening     Status: None   Collection Time: 06/26/17  5:14 PM  Result Value Ref Range Status   MRSA by PCR NEGATIVE NEGATIVE Final    Comment:        The GeneXpert MRSA Assay (FDA approved for NASAL specimens only), is one component of a comprehensive MRSA colonization surveillance program. It is not intended  to diagnose MRSA infection nor to guide or monitor treatment for MRSA infections.   Gram stain     Status: None   Collection Time: 06/28/17 11:41 AM  Result Value Ref Range Status   Specimen Description PLEURAL RIGHT  Final   Special Requests NONE  Final   Gram Stain   Final    RARE WBC PRESENT, PREDOMINANTLY MONONUCLEAR NO ORGANISMS SEEN    Report Status 06/28/2017 FINAL  Final  Culture, body fluid-bottle     Status: None (Preliminary result)   Collection Time: 06/28/17 11:41 AM  Result Value Ref Range Status   Specimen Description PLEURAL RIGHT  Final   Special Requests NONE  Final   Culture NO GROWTH < 24 HOURS  Final   Report Status PENDING  Incomplete     Labs: Basic Metabolic Panel:  Recent Labs Lab 06/26/17 0939 06/27/17 0710 06/29/17 0357  NA 136 135 134*  K 4.3 3.9 3.5  CL 102 101 103  CO2 26 26 26   GLUCOSE 109* 87 110*  BUN 24* 22* 12  CREATININE 1.45* 1.21 1.20  CALCIUM 9.4 9.3 8.4*   Liver Function Tests:  Recent Labs Lab 06/26/17 0939 06/27/17 0710  AST 71* 57*  ALT 34 29  ALKPHOS 91 80  BILITOT 9.7* 8.9*  PROT 6.0* 4.9*  ALBUMIN 2.2* 2.4*    Recent Labs Lab 06/26/17 0939  LIPASE 26    Recent Labs Lab 06/26/17 1135  AMMONIA 24   CBC:  Recent Labs Lab 06/26/17 0939 06/26/17 1630 06/27/17 0710 06/27/17 1836 06/28/17 0204 06/29/17 0357  WBC 8.9 8.3 6.4  --  6.5 7.1  HGB 6.9* 6.5* 8.2* 8.9* 8.3* 8.6*  HCT 21.3* 20.1* 24.1* 27.2* 24.8* 25.7*  MCV 100.9* 101.5* 94.1  --  94.3 95.5  PLT 114* 106* 86*  --  84* 76*   Cardiac Enzymes: No results for input(s): CKTOTAL, CKMB, CKMBINDEX, TROPONINI in the last 168 hours. BNP: BNP (last 3 results)  Recent Labs  03/27/17 2051 06/13/17 1856  BNP 106.7* 79.3    ProBNP (last 3 results) No results for input(s): PROBNP in the last 8760 hours.  CBG: No results for input(s): GLUCAP in the last 168 hours.

## 2017-06-29 NOTE — Telephone Encounter (Signed)
Spoke to patient's wife, confirmed with her about getting labs done on Monday. Let her know about the follow up appointment with Dr. Hilarie Fredrickson on 7/27 and they we would be giving them information about counseling and next paracentesis at his visit next Friday.

## 2017-06-29 NOTE — Progress Notes (Signed)
The patient has been given his discharge instructions along with a medication list and what to take today. He has prescriptions to pick up and follow up appointments. He is discharging with his wife via car.   Saddie Benders RN

## 2017-06-29 NOTE — Discharge Instructions (Signed)
Ciprofloxacin extended-release tablets What is this medicine? CIPROFLOXACIN (sip roe FLOX a sin) is a quinolone antibiotic. It is used to treat certain kinds of bacterial infections. It will not work for colds, flu, or other viral infections. This medicine may be used for other purposes; ask your health care provider or pharmacist if you have questions. COMMON BRAND NAME(S): Cipro XR, Proquin XR What should I tell my health care provider before I take this medicine? They need to know if you have any of these conditions: -bone problems -history of low potassium levels in the blood -irregular heartbeat -joint problems -kidney disease -myasthenia gravis -seizures -tendon problems -tingling of the fingers or toes, or other nerve disorder -an unusual or allergic reaction to ciprofloxacin, other antibiotics or medicines, foods, dyes, or preservatives -pregnant or trying to get pregnant -breast-feeding How should I use this medicine? Take this medicine by mouth with a full glass of water. Follow the directions on the prescription label. Do not split, crush, or chew the tablet. Take your medicine at regular intervals. Do not take your medicine more often than directed. Take all of your medicine as directed even if you think your are better. Do not skip doses or stop your medicine early. You can take this medicine with food or on an empty stomach. It can be taken with a meal that contains dairy or calcium, but do not take it alone with a dairy product, like milk or yogurt, or calcium-fortified juice. A special MedGuide will be given to you by the pharmacist with each prescription and refill. Be sure to read this information carefully each time. Talk to your pediatrician regarding the use of this medicine in children. Special care may be needed. Overdosage: If you think you have taken too much of this medicine contact a poison control center or emergency room at once. NOTE: This medicine is only for  you. Do not share this medicine with others. What if I miss a dose? If you miss a dose, take it as soon as you can. If it is almost time for your next dose, take only that dose. Do not take double or extra doses. Do not take more than one dose in a day. What may interact with this medicine? Do not take this medicine with any of the following medications: -cisapride -dofetilide -dronedarone -flibanserin -lomitapide -pimozide -thioridazine -tizanidine -ziprasidone This medicine may also interact with the following medications: -antacids -birth control pills -caffeine -certain medicines for diabetes, like glipizide or glyburide -certain medicines that treat or prevent blood clots like warfarin -clozapine -cyclosporine -didanosine (ddI) buffered tablets or powder -duloxetine -lanthanum carbonate -lidocaine -methotrexate -multivitamins -NSAIDS, medicines for pain and inflammation, like ibuprofen or naproxen -olanzapine -omeprazole -other medicines that prolong the QT interval (cause an abnormal heart rhythm) -phenytoin -probenecid -ropinirole -sevelamer -sildenafil -sucralfate -theophylline -zolpidem This list may not describe all possible interactions. Give your health care provider a list of all the medicines, herbs, non-prescription drugs, or dietary supplements you use. Also tell them if you smoke, drink alcohol, or use illegal drugs. Some items may interact with your medicine. What should I watch for while using this medicine? Tell your doctor or health care professional if your symptoms do not improve. Do not treat diarrhea with over the counter products. Contact your doctor if you have diarrhea that lasts more than 2 days or if it is severe and watery. You may get drowsy or dizzy. Do not drive, use machinery, or do anything that needs mental alertness until you  know how this medicine affects you. Do not stand or sit up quickly, especially if you are an older patient.  This reduces the risk of dizzy or fainting spells. This medicine can make you more sensitive to the sun. Keep out of the sun. If you cannot avoid being in the sun, wear protective clothing and use sunscreen. Do not use sun lamps or tanning beds/booths. Avoid antacids, aluminum, calcium, iron, magnesium, and zinc products for 6 hours before and 2 hours after taking a dose of this medicine. What side effects may I notice from receiving this medicine? Side effects that you should report to your doctor or health care professional as soon as possible: -allergic reactions like skin rash or hives, swelling of the face, lips, or tongue -anxious -confusion -depressed mood -diarrhea -fast, irregular heartbeat -hallucination, loss of contact with reality -joint, muscle, or tendon pain or swelling -pain, tingling, numbness in the hands or feet -suicidal thoughts or other mood changes -sunburn -unusually weak or tired Side effects that usually do not require medical attention (report to your doctor or health care professional if they continue or are bothersome): -dry mouth -headache -nausea -trouble sleeping This list may not describe all possible side effects. Call your doctor for medical advice about side effects. You may report side effects to FDA at 1-800-FDA-1088. Where should I keep my medicine? Keep out of the reach of children. Store at room temperature between 15 to 30 degrees C (59 to 86 degrees F). Keep container tightly closed. Throw away any unused medicine after the expiration date. NOTE: This sheet is a summary. It may not cover all possible information. If you have questions about this medicine, talk to your doctor, pharmacist, or health care provider.  2018 Elsevier/Gold Standard (2016-07-07 14:26:55)

## 2017-06-29 NOTE — Telephone Encounter (Signed)
-----   Message from Manus Gunning, MD sent at 06/29/2017  9:27 AM EDT ----- Regarding: follow up Thomas [name] is hopefully going to be discharged later today. If he is, he will need labs CBC, CMET drawn on Monday. 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 Hopefully his kidneys tolerate it. [facility] got in touch with him, once he completes alcohol counseling he will be listed for transplant, likely around October time frame. Vaughan Basta he needs coordination for couseling in [place] if possible, and he also likely needs scheduled paracentesis within 2 weeks  Thanks

## 2017-07-02 ENCOUNTER — Other Ambulatory Visit (INDEPENDENT_AMBULATORY_CARE_PROVIDER_SITE_OTHER): Payer: BLUE CROSS/BLUE SHIELD

## 2017-07-02 DIAGNOSIS — K7031 Alcoholic cirrhosis of liver with ascites: Secondary | ICD-10-CM | POA: Diagnosis not present

## 2017-07-02 LAB — CBC WITH DIFFERENTIAL/PLATELET
Basophils Absolute: 0.1 10*3/uL (ref 0.0–0.1)
Basophils Relative: 0.8 % (ref 0.0–3.0)
Eosinophils Absolute: 0.1 10*3/uL (ref 0.0–0.7)
Eosinophils Relative: 1.8 % (ref 0.0–5.0)
HCT: 27.7 % — ABNORMAL LOW (ref 39.0–52.0)
Hemoglobin: 9.6 g/dL — ABNORMAL LOW (ref 13.0–17.0)
Lymphocytes Relative: 11.3 % — ABNORMAL LOW (ref 12.0–46.0)
Lymphs Abs: 0.9 10*3/uL (ref 0.7–4.0)
MCHC: 34.7 g/dL (ref 30.0–36.0)
MCV: 100.3 fl — ABNORMAL HIGH (ref 78.0–100.0)
Monocytes Absolute: 1.4 10*3/uL — ABNORMAL HIGH (ref 0.1–1.0)
Monocytes Relative: 17 % — ABNORMAL HIGH (ref 3.0–12.0)
Neutro Abs: 5.7 10*3/uL (ref 1.4–7.7)
Neutrophils Relative %: 69.1 % (ref 43.0–77.0)
Platelets: 87 10*3/uL — ABNORMAL LOW (ref 150.0–400.0)
RBC: 2.76 Mil/uL — ABNORMAL LOW (ref 4.22–5.81)
RDW: 25.3 % — ABNORMAL HIGH (ref 11.5–15.5)
WBC: 8.2 10*3/uL (ref 4.0–10.5)

## 2017-07-02 LAB — COMPREHENSIVE METABOLIC PANEL
ALBUMIN: 2.5 g/dL — AB (ref 3.5–5.2)
ALK PHOS: 111 U/L (ref 39–117)
ALT: 34 U/L (ref 0–53)
AST: 56 U/L — ABNORMAL HIGH (ref 0–37)
BUN: 11 mg/dL (ref 6–23)
CHLORIDE: 97 meq/L (ref 96–112)
CO2: 26 mEq/L (ref 19–32)
Calcium: 8.2 mg/dL — ABNORMAL LOW (ref 8.4–10.5)
Creatinine, Ser: 1.34 mg/dL (ref 0.40–1.50)
GFR: 58.8 mL/min — AB (ref >60.00)
Glucose, Bld: 109 mg/dL — ABNORMAL HIGH (ref 70–99)
POTASSIUM: 3.6 meq/L (ref 3.5–5.1)
SODIUM: 132 meq/L — AB (ref 135–145)
TOTAL PROTEIN: 5.8 g/dL — AB (ref 6.0–8.3)
Total Bilirubin: 8.3 mg/dL — ABNORMAL HIGH (ref 0.2–1.2)

## 2017-07-02 LAB — PH, BODY FLUID: PH, BODY FLUID: 7.6

## 2017-07-03 ENCOUNTER — Telehealth: Payer: Self-pay | Admitting: *Deleted

## 2017-07-03 ENCOUNTER — Other Ambulatory Visit: Payer: Self-pay | Admitting: *Deleted

## 2017-07-03 DIAGNOSIS — K7031 Alcoholic cirrhosis of liver with ascites: Secondary | ICD-10-CM

## 2017-07-03 LAB — CULTURE, BODY FLUID W GRAM STAIN -BOTTLE: Culture: NO GROWTH

## 2017-07-03 LAB — CULTURE, BODY FLUID-BOTTLE

## 2017-07-03 NOTE — Telephone Encounter (Signed)
-----   Message from Larina Bras, [place] sent at 06/27/2017  3:17 PM EDT ----- Regarding: FW: scheduled paracentesis   ----- Message ----- From: Algernon Huxley, RN Sent: 06/27/2017   2:20 PM To: Larina Bras, CMA Subject: FW: scheduled paracentesis                     Dottie,  I just tried to do this for Mr. Italiano but he is still an inpt. Can you do this when he is discharged for me? I will be on vacation 7/19-7/28.  He would be due for the next one on 07/10/17.  Last orders were for IR PARA, remove up to 6 liters Albumin 50g IV if 4 liter or less removed, Albumin 75g if greater than 4 liters removed. He wants fluid sent off for cell count.  Thanks, Vaughan Basta  ----- Message ----- From: [name], MD Sent: 06/27/2017   9:28 AM To: Algernon Huxley, RN, Vena Rua, PA-C, # Subject: RE: scheduled paracentesis                     Devota Pace to schedule LVP with IV albumin every 2 weeks (lengthen to 3 weeks if he can tolerate) JMP  ----- Message ----- From: Vena Rua, PA-C Sent: 06/27/2017   9:03 AM To: Algernon Huxley, RN, [name], MD, # Subject: scheduled paracentesis                         Pt and wife wondering if someone can arrange for bimonthly paracentesis.   Given AKI recurrences, not sure he will tolerate increase diuretics and with hx HE, TIPS not a great option.    Are scheduled taps something you think would help pt and would you be willing to oversee?  Thanks, Judson Roch

## 2017-07-03 NOTE — Telephone Encounter (Signed)
Patient is scheduled for paracentesis with IV albumin an fluid cell count at [facility] on 7/31/8 @ 9 am with 8:45 am arrival. NPO midnight. Patient will be advised of this appointment when he comes for his office visit with Dr Hilarie Fredrickson on Friday, 07/06/17.

## 2017-07-04 ENCOUNTER — Other Ambulatory Visit: Payer: Self-pay

## 2017-07-04 DIAGNOSIS — K7460 Unspecified cirrhosis of liver: Secondary | ICD-10-CM

## 2017-07-04 DIAGNOSIS — R188 Other ascites: Principal | ICD-10-CM

## 2017-07-06 ENCOUNTER — Ambulatory Visit (INDEPENDENT_AMBULATORY_CARE_PROVIDER_SITE_OTHER): Payer: BLUE CROSS/BLUE SHIELD | Admitting: Internal Medicine

## 2017-07-06 ENCOUNTER — Encounter: Payer: Self-pay | Admitting: Internal Medicine

## 2017-07-06 ENCOUNTER — Other Ambulatory Visit (INDEPENDENT_AMBULATORY_CARE_PROVIDER_SITE_OTHER): Payer: BLUE CROSS/BLUE SHIELD

## 2017-07-06 VITALS — BP 118/70 | HR 77 | Ht 64.5 in | Wt 190.4 lb

## 2017-07-06 DIAGNOSIS — K766 Portal hypertension: Secondary | ICD-10-CM

## 2017-07-06 DIAGNOSIS — K7031 Alcoholic cirrhosis of liver with ascites: Secondary | ICD-10-CM

## 2017-07-06 DIAGNOSIS — J948 Other specified pleural conditions: Secondary | ICD-10-CM

## 2017-07-06 DIAGNOSIS — D500 Iron deficiency anemia secondary to blood loss (chronic): Secondary | ICD-10-CM | POA: Diagnosis not present

## 2017-07-06 LAB — COMPREHENSIVE METABOLIC PANEL
ALK PHOS: 140 U/L — AB (ref 39–117)
ALT: 32 U/L (ref 0–53)
AST: 48 U/L — AB (ref 0–37)
Albumin: 2.5 g/dL — ABNORMAL LOW (ref 3.5–5.2)
BUN: 12 mg/dL (ref 6–23)
CO2: 27 mEq/L (ref 19–32)
Calcium: 8.2 mg/dL — ABNORMAL LOW (ref 8.4–10.5)
Chloride: 100 mEq/L (ref 96–112)
Creatinine, Ser: 1.43 mg/dL (ref 0.40–1.50)
GFR: 54.55 mL/min — ABNORMAL LOW (ref >60.00)
GLUCOSE: 114 mg/dL — AB (ref 70–99)
POTASSIUM: 4.3 meq/L (ref 3.5–5.1)
SODIUM: 134 meq/L — AB (ref 135–145)
TOTAL PROTEIN: 6 g/dL (ref 6.0–8.3)
Total Bilirubin: 6.8 mg/dL — ABNORMAL HIGH (ref 0.2–1.2)

## 2017-07-06 LAB — CBC WITH DIFFERENTIAL/PLATELET
BASOS ABS: 0 10*3/uL (ref 0.0–0.1)
Basophils Relative: 0.4 % (ref 0.0–3.0)
EOS ABS: 0.2 10*3/uL (ref 0.0–0.7)
Eosinophils Relative: 3 % (ref 0.0–5.0)
HCT: 27.2 % — ABNORMAL LOW (ref 39.0–52.0)
Hemoglobin: 9.2 g/dL — ABNORMAL LOW (ref 13.0–17.0)
LYMPHS ABS: 0.8 10*3/uL (ref 0.7–4.0)
Lymphocytes Relative: 10.9 % — ABNORMAL LOW (ref 12.0–46.0)
MCHC: 33.7 g/dL (ref 30.0–36.0)
MCV: 101.6 fl — ABNORMAL HIGH (ref 78.0–100.0)
MONO ABS: 1.3 10*3/uL — AB (ref 0.1–1.0)
Monocytes Relative: 18.8 % — ABNORMAL HIGH (ref 3.0–12.0)
NEUTROS PCT: 66.9 % (ref 43.0–77.0)
Neutro Abs: 4.8 10*3/uL (ref 1.4–7.7)
Platelets: 93 10*3/uL — ABNORMAL LOW (ref 150.0–400.0)
RBC: 2.68 Mil/uL — AB (ref 4.22–5.81)
RDW: 25.7 % — ABNORMAL HIGH (ref 11.5–15.5)
WBC: 7.2 10*3/uL (ref 4.0–10.5)

## 2017-07-06 LAB — PROTIME-INR
INR: 2.4 ratio — ABNORMAL HIGH (ref 0.8–1.0)
Prothrombin Time: 26.2 s — ABNORMAL HIGH (ref 9.6–13.1)

## 2017-07-06 MED ORDER — PANTOPRAZOLE SODIUM 40 MG PO TBEC: DELAYED_RELEASE_TABLET | ORAL | 2 refills | Status: DC

## 2017-07-06 MED ORDER — LACTULOSE 10 GM/15ML PO SOLN: 20.0000 g | Freq: Two times a day (BID) | ORAL | 1 refills | Status: DC

## 2017-07-06 MED ORDER — FUROSEMIDE 20 MG PO TABS: 40.0000 mg | ORAL_TABLET | Freq: Two times a day (BID) | ORAL | 2 refills | Status: DC

## 2017-07-06 MED ORDER — SPIRONOLACTONE 100 MG PO TABS: 150.0000 mg | ORAL_TABLET | Freq: Every day | ORAL | 2 refills | Status: DC

## 2017-07-06 NOTE — Progress Notes (Signed)
Subjective:    Patient ID: Thomas [name], male    DOB: 08-Sep-1961, 56 y.o.   MRN: [id number]  HPI Thomas [name] is a 56 year old male with alcoholic cirrhosis, get about portal hypertension with history of small esophageal varices, portal gastropathy, hepatic encephalopathy, ascites with history of SBP, hepatic hydrothorax, history of renal insufficiency and anemia who is here for follow-up. He was hospitalized again recently with volume overload and hepatic hydrothorax. He received a 1.3 L thoracentesis, a 3.7 L paracentesis. He received 2 units of packed red cells for hemoglobin of 6.5. He was found to have mild acute kidney injury felt to be prerenal from dehydration. He was discharged recently.  He reports that he has been feeling fairly well. He states he always feels better after paracentesis. He denies shortness of breath today but does state he will quickly become out of breath if he exerts himself. He is not having trouble lying flat or sleeping. He denies abdominal pain today. He denies rectal bleeding and melena. He is taking oral iron therapy and notes that his stools are brown to greenish in color. He's having 2-3 bowel movement once per day on lactulose twice a day. He is trying to monitor sodium in his diet. He still has some lower extremity swelling but denies lower extremity pain. He's taking Aldactone 150 mg daily and Lasix 40 mg twice a day. He takes twice a day PPI. He takes Cipro daily for SBP prophylaxis.  He is trying to establish with substance abuse counseling. He is aware that he needs at least 6 sessions of therapy as part of his pretransplant evaluation all occurring through [facility] hepatology and Dr. Marcille Buffy.   Review of Systems As per HPI, otherwise negative  Current Medications, Allergies, Past Medical History, Past Surgical History, Family History and Social History were reviewed in [employer] record.     Objective:   Physical Exam BP 118/70  (BP Location: Left Arm, Patient Position: Sitting, Cuff Size: Normal)   Pulse 77   Ht 5' 4.5" (1.638 m)   Wt 190 lb 6.4 oz (86.4 kg)   SpO2 97%   BMI 32.18 kg/m  Constitutional: Well-developed Chronically ill-appearing male in no acute distress HEENT: Normocephalic and atraumatic. Oropharynx is clear and moist. Conjunctivae are pale.  Sclera are anicteric Neck: Neck supple. Trachea midline. Cardiovascular: Normal rate, regular rhythm and intact distal pulses. No M/R/G Pulmonary/chest: Effort normal and breath sounds normal. No wheezing, rales or rhonchi. Abdominal: Soft, nontender, mildly distended without being tense. Bowel sounds active throughout.  Extremities: no clubbing, cyanosis, 2+ edema Neurological: Alert and oriented to person place and time. No asterixis Skin: Skin is warm and dry. Psychiatric: Normal mood and affect. Behavior is normal.  CBC    Component Value Date/Time   WBC 7.2 07/06/2017 1510   RBC 2.68 (L) 07/06/2017 1510   HGB 9.2 (L) 07/06/2017 1510   HCT 27.2 (L) 07/06/2017 1510   PLT 93.0 (L) 07/06/2017 1510   MCV 101.6 (H) 07/06/2017 1510   MCH 32.0 06/29/2017 0357   MCHC 33.7 07/06/2017 1510   RDW 25.7 (H) 07/06/2017 1510   LYMPHSABS 0.8 07/06/2017 1510   MONOABS 1.3 (H) 07/06/2017 1510   EOSABS 0.2 07/06/2017 1510   BASOSABS 0.0 07/06/2017 1510   CMP     Component Value Date/Time   NA 134 (L) 07/06/2017 1510   K 4.3 07/06/2017 1510   CL 100 07/06/2017 1510   CO2 27 07/06/2017 1510   GLUCOSE  114 (H) 07/06/2017 1510   BUN 12 07/06/2017 1510   CREATININE 1.43 07/06/2017 1510   CALCIUM 8.2 (L) 07/06/2017 1510   PROT 6.0 07/06/2017 1510   ALBUMIN 2.5 (L) 07/06/2017 1510   AST 48 (H) 07/06/2017 1510   ALT 32 07/06/2017 1510   ALKPHOS 140 (H) 07/06/2017 1510   BILITOT 6.8 (H) 07/06/2017 1510   GFRNONAA >60 06/29/2017 0357   GFRAA >60 06/29/2017 0357   Lab Results  Component Value Date   INR 2.4 (H) 07/06/2017   INR 2.22 06/26/2017   INR 2.19  06/15/2017       Assessment & Plan:  56 year old male with alcoholic cirrhosis, get about portal hypertension with history of small esophageal varices, portal gastropathy, hepatic encephalopathy, ascites with history of SBP, hepatic hydrothorax, history of renal insufficiency and anemia who is here for follow-up.  1.  Alcohol cirrhosis with portal hypertension -- he has been readmitted and unfortunately now has developed right pleural fluid consistent with hepatic hydrothorax. He is being evaluated for transplant and hopefully will eventually be able to be listed at [facility]. --He needs to call [facility] for follow-up. --We will schedule paracentesis with IV albumin every 2 weeks going forward --Continue Lasix 40 mg twice a day and Aldactone 150 mg daily --Closely monitor renal function --Low sodium diet --Alcohol avoidance. I have reached out to behavioral health in my group to inquire about substance abuse counseling and also given him the contact information for [facility] at phone number [phone] --Continue Cipro daily for SBP prophylaxis --Continue lactulose twice a day. No evidence of encephalopathy today  2. Anemia due to chronic GI blood loss/portal gastropathy -- blood count stable today. Continue twice a day PPI.  Follow-up in 4-6 weeks, sooner if necessary/needed 25 minutes spent with the patient today. Greater than 50% was spent in counseling and coordination of care with the patient

## 2017-07-06 NOTE — Patient Instructions (Addendum)
Your physician has requested that you go to the basement for the following lab work before leaving today: CBC, CMP, INR  Please continue lactulose twice daily  Continue lasix and aldactone at current doses.  Continue iron.  Continue Cipro.  Continue pantoprazole twice daily.  Follow a low sodium diet (2 grams or less daily).  You are scheduled for paracentesis at [facility] on Tuesday, 07/10/17 at 9:00 am. Please arrive at 8:30 am for registration.  You have been scheduled for a follow up with Dr Hilarie Fredrickson, on Monday, 09/10/17 @ 1:45 pm.  If you are age 55 or older, your body mass index should be between 23-30. Your Body mass index is 32.18 kg/m. If this is out of the aforementioned range listed, please consider follow up with your Primary Care Provider.  If you are age 35 or younger, your body mass index should be between 19-25. Your Body mass index is 32.18 kg/m. If this is out of the aformentioned range listed, please consider follow up with your Primary Care Provider.

## 2017-07-10 ENCOUNTER — Ambulatory Visit (HOSPITAL_COMMUNITY)
Admission: RE | Admit: 2017-07-10 | Discharge: 2017-07-10 | Disposition: A | Discharge status: Home / Self Care | Payer: BLUE CROSS/BLUE SHIELD | Source: Ambulatory Visit | Attending: Internal Medicine | Admitting: Internal Medicine

## 2017-07-10 ENCOUNTER — Encounter (HOSPITAL_COMMUNITY): Payer: Self-pay | Admitting: Student

## 2017-07-10 DIAGNOSIS — K7031 Alcoholic cirrhosis of liver with ascites: Secondary | ICD-10-CM

## 2017-07-10 DIAGNOSIS — R188 Other ascites: Secondary | ICD-10-CM | POA: Diagnosis not present

## 2017-07-10 HISTORY — PX: IR PARACENTESIS: IMG2679

## 2017-07-10 LAB — BODY FLUID CELL COUNT WITH DIFFERENTIAL
Eos, Fluid: 0 %
Lymphs, Fluid: 52 %
Monocyte-Macrophage-Serous Fluid: 47 % — ABNORMAL LOW (ref 50–90)
NEUTROPHIL FLUID: 1 % (ref 0–25)
Total Nucleated Cell Count, Fluid: 62 cu mm (ref 0–1000)

## 2017-07-10 MED ORDER — LIDOCAINE HCL (PF) 1 % IJ SOLN
INTRAMUSCULAR | Status: AC
  Filled 2017-07-10: qty 30

## 2017-07-10 MED ORDER — ALBUMIN HUMAN 25 % IV SOLN
50.0000 g | Freq: Once | INTRAVENOUS | Status: AC
  Administered 2017-07-10: 50 g via INTRAVENOUS
  Filled 2017-07-10: qty 200

## 2017-07-10 MED ORDER — LIDOCAINE HCL 1 % IJ SOLN
INTRAMUSCULAR | Status: DC | PRN
  Administered 2017-07-10: 10 mL

## 2017-07-10 NOTE — Procedures (Signed)
PROCEDURE SUMMARY:  Successful US guided paracentesis from right lateral abdomen.  Yielded 2.9 liters of clear, yellow fluid.  No immediate complications.  Pt tolerated well.   Specimen was sent for labs. Patient was ordered 50g albumin post-procedure.   Docia Barrier PA-C 07/10/2017 10:01 AM

## 2017-07-11 LAB — PATHOLOGIST SMEAR REVIEW

## 2017-07-19 ENCOUNTER — Telehealth: Payer: Self-pay | Admitting: Gastroenterology

## 2017-07-19 ENCOUNTER — Other Ambulatory Visit (INDEPENDENT_AMBULATORY_CARE_PROVIDER_SITE_OTHER): Payer: BLUE CROSS/BLUE SHIELD

## 2017-07-19 ENCOUNTER — Telehealth: Payer: Self-pay | Admitting: Internal Medicine

## 2017-07-19 DIAGNOSIS — K7460 Unspecified cirrhosis of liver: Secondary | ICD-10-CM | POA: Diagnosis not present

## 2017-07-19 DIAGNOSIS — R188 Other ascites: Secondary | ICD-10-CM

## 2017-07-19 LAB — CBC WITH DIFFERENTIAL/PLATELET
Basophils Absolute: 0 10*3/uL (ref 0.0–0.1)
Basophils Relative: 0.4 % (ref 0.0–3.0)
Eosinophils Absolute: 0.2 10*3/uL (ref 0.0–0.7)
Eosinophils Relative: 2.3 % (ref 0.0–5.0)
HCT: 21.2 % — CL (ref 39.0–52.0)
Hemoglobin: 7.3 g/dL — CL (ref 13.0–17.0)
Lymphocytes Relative: 14.6 % (ref 12.0–46.0)
Lymphs Abs: 1.1 10*3/uL (ref 0.7–4.0)
MCHC: 34.3 g/dL (ref 30.0–36.0)
MCV: 106.6 fl — ABNORMAL HIGH (ref 78.0–100.0)
Monocytes Absolute: 1.4 10*3/uL — ABNORMAL HIGH (ref 0.1–1.0)
Monocytes Relative: 18 % — ABNORMAL HIGH (ref 3.0–12.0)
Neutro Abs: 4.9 10*3/uL (ref 1.4–7.7)
Neutrophils Relative %: 64.7 % (ref 43.0–77.0)
Platelets: 91 10*3/uL — ABNORMAL LOW (ref 150.0–400.0)
RBC: 1.99 Mil/uL — ABNORMAL LOW (ref 4.22–5.81)
RDW: 23.7 % — ABNORMAL HIGH (ref 11.5–15.5)
WBC: 7.6 10*3/uL (ref 4.0–10.5)

## 2017-07-19 LAB — PROTIME-INR
INR: 2.7 ratio — ABNORMAL HIGH (ref 0.8–1.0)
Prothrombin Time: 28.9 s — ABNORMAL HIGH (ref 9.6–13.1)

## 2017-07-19 MED ORDER — FOLIC ACID 1 MG PO TABS: 1.0000 mg | ORAL_TABLET | Freq: Every day | ORAL | 0 refills | Status: DC

## 2017-07-19 MED ORDER — CIPROFLOXACIN HCL 500 MG PO TABS: 500.0000 mg | ORAL_TABLET | Freq: Every evening | ORAL | 0 refills | Status: DC

## 2017-07-19 NOTE — Telephone Encounter (Signed)
Called patient and informed critical lab results: Hemoglobin 7.2. Last hemoglobin on 07/06/2017 was 9.. Patient states he doesn't feel any different. Denies any melena or bright red blood per rectum. Recheck CBC tomorrow, if continues to be persistently low,  have to send patient to ER for evaluation  K. Denzil Magnuson , MD [phone] Mon-Fri 8a-5p [phone] after 5p, weekends, holidays

## 2017-07-19 NOTE — Telephone Encounter (Signed)
Rx sent 

## 2017-07-20 ENCOUNTER — Other Ambulatory Visit: Payer: Self-pay

## 2017-07-20 DIAGNOSIS — D649 Anemia, unspecified: Secondary | ICD-10-CM

## 2017-07-20 LAB — COMPREHENSIVE METABOLIC PANEL
ALT: 28 U/L (ref 0–53)
AST: 44 U/L — AB (ref 0–37)
Albumin: 2.8 g/dL — ABNORMAL LOW (ref 3.5–5.2)
Alkaline Phosphatase: 116 U/L (ref 39–117)
BUN: 10 mg/dL (ref 6–23)
CHLORIDE: 100 meq/L (ref 96–112)
CO2: 27 meq/L (ref 19–32)
CREATININE: 1.28 mg/dL (ref 0.40–1.50)
Calcium: 9.2 mg/dL (ref 8.4–10.5)
GFR: 61.99 mL/min (ref >60.00)
Glucose, Bld: 157 mg/dL — ABNORMAL HIGH (ref 70–99)
POTASSIUM: 4 meq/L (ref 3.5–5.1)
SODIUM: 134 meq/L — AB (ref 135–145)
Total Bilirubin: 6.3 mg/dL — ABNORMAL HIGH (ref 0.2–1.2)
Total Protein: 6.3 g/dL (ref 6.0–8.3)

## 2017-07-20 NOTE — Telephone Encounter (Signed)
Spoke with pt and he is aware, order in epic. 

## 2017-07-20 NOTE — Telephone Encounter (Signed)
Letter faxed per pt request, wife aware.

## 2017-07-23 ENCOUNTER — Other Ambulatory Visit: Payer: Self-pay

## 2017-07-23 DIAGNOSIS — J90 Pleural effusion, not elsewhere classified: Secondary | ICD-10-CM | POA: Diagnosis not present

## 2017-07-23 DIAGNOSIS — K766 Portal hypertension: Secondary | ICD-10-CM | POA: Diagnosis not present

## 2017-07-23 DIAGNOSIS — D649 Anemia, unspecified: Secondary | ICD-10-CM

## 2017-07-23 DIAGNOSIS — K7460 Unspecified cirrhosis of liver: Secondary | ICD-10-CM | POA: Diagnosis not present

## 2017-07-24 ENCOUNTER — Ambulatory Visit (HOSPITAL_COMMUNITY)
Admission: RE | Admit: 2017-07-24 | Discharge: 2017-07-24 | Disposition: A | Discharge status: Home / Self Care | Payer: BLUE CROSS/BLUE SHIELD | Source: Ambulatory Visit | Attending: Internal Medicine | Admitting: Internal Medicine

## 2017-07-24 ENCOUNTER — Encounter (HOSPITAL_COMMUNITY): Payer: Self-pay | Admitting: *Deleted

## 2017-07-24 DIAGNOSIS — D649 Anemia, unspecified: Secondary | ICD-10-CM | POA: Diagnosis not present

## 2017-07-24 DIAGNOSIS — K922 Gastrointestinal hemorrhage, unspecified: Secondary | ICD-10-CM | POA: Insufficient documentation

## 2017-07-24 LAB — PREPARE RBC (CROSSMATCH)

## 2017-07-24 MED ORDER — SODIUM CHLORIDE 0.9 % IV SOLN
Freq: Once | INTRAVENOUS | Status: AC
  Administered 2017-07-24: 11:00:00 via INTRAVENOUS

## 2017-07-24 NOTE — Progress Notes (Signed)
Diagnosis Association: Anemia, unspecified type (D64.9)  Provider: Dr. Billie Lade  Procedure: Pt was typed and cross matched and received 2 units of packed red blood cells.  Pt tolerated procedure well.  Post procedure: Pt alert, oriented and ambulatory at discharge. D/C instructions given with verbal understanding.

## 2017-07-24 NOTE — Discharge Instructions (Signed)
Blood Transfusion, Adult, Care After This sheet gives you information about how to care for yourself after your procedure. Your health care provider may also give you more specific instructions. If you have problems or questions, contact your health care provider. What can I expect after the procedure? After your procedure, it is common to have:  Bruising and soreness where the IV tube was inserted.  Headache.  Follow these instructions at home:  Take over-the-counter and prescription medicines only as told by your health care provider.  Return to your normal activities as told by your health care provider.  Follow instructions from your health care provider about how to take care of your IV insertion site. Make sure you: ? Wash your hands with soap and water before you change your bandage (dressing). If soap and water are not available, use hand sanitizer. ? Change your dressing as told by your health care provider.  Check your IV insertion site every day for signs of infection. Check for: ? More redness, swelling, or pain. ? More fluid or blood. ? Warmth. ? Pus or a bad smell. Contact a health care provider if:  You have more redness, swelling, or pain around the IV insertion site.  You have more fluid or blood coming from the IV insertion site.  Your IV insertion site feels warm to the touch.  You have pus or a bad smell coming from the IV insertion site.  Your urine turns pink, red, or brown.  You feel weak after doing your normal activities. Get help right away if:  You have signs of a serious allergic or immune system reaction, including: ? Itchiness. ? Hives. ? Trouble breathing. ? Anxiety. ? Chest or lower back pain. ? Fever, flushing, and chills. ? Rapid pulse. ? Rash. ? Diarrhea. ? Vomiting. ? Dark urine. ? Serious headache. ? Dizziness. ? Stiff neck. ? Yellow coloration of the face or the white parts of the eyes (jaundice). This information is not  intended to replace advice given to you by your health care provider. Make sure you discuss any questions you have with your health care provider. Document Released: 12/18/2014 Document Revised: 07/26/2016 Document Reviewed: 06/12/2016 Elsevier Interactive Patient Education  2018 Elsevier Inc.  

## 2017-07-25 ENCOUNTER — Ambulatory Visit (INDEPENDENT_AMBULATORY_CARE_PROVIDER_SITE_OTHER): Payer: BLUE CROSS/BLUE SHIELD | Admitting: Psychiatry

## 2017-07-25 DIAGNOSIS — F1020 Alcohol dependence, uncomplicated: Secondary | ICD-10-CM

## 2017-07-25 LAB — TYPE AND SCREEN
ABO/RH(D): A POS
ANTIBODY SCREEN: NEGATIVE
UNIT DIVISION: 0
UNIT DIVISION: 0

## 2017-07-25 LAB — BPAM RBC
BLOOD PRODUCT EXPIRATION DATE: 201808292359
Blood Product Expiration Date: 201808292359
ISSUE DATE / TIME: 201808141024
ISSUE DATE / TIME: 201808141024
UNIT TYPE AND RH: 6200
Unit Type and Rh: 6200

## 2017-08-06 ENCOUNTER — Telehealth: Payer: Self-pay | Admitting: Internal Medicine

## 2017-08-06 MED ORDER — LACTULOSE 20 G PO PACK: 20.0000 g | PACK | Freq: Two times a day (BID) | ORAL | 3 refills | Status: DC

## 2017-08-06 NOTE — Telephone Encounter (Signed)
Pts wife state pt cannot keep his lactulose down. States he hasn't been able to keep it down since Friday and his mental status is strtint to change. She even states she has tried to give it to him in small amounts and that isn't working. Pt cannot take Xifaxan due to swelling. Please advise.

## 2017-08-06 NOTE — Telephone Encounter (Signed)
I assume that she means the lactulose is causing nausea and vomiting?  If so, then would try the brand Ronnald Ramp which is a packet that can be dissolved in fluid and can be more tolerable to patients Would try Kristalose 20 g two times daily to ensure 2-3 BMs/day, can give TID initially until BMs start and mental status improves If confusion or encephalopathy worsens 1st then he needs to go to the ED either here or at [facility] (his hepatology team is [place])

## 2017-08-06 NOTE — Telephone Encounter (Signed)
Spoke with pts wife and she is aware, script sent to pharmacy. 

## 2017-08-14 ENCOUNTER — Telehealth: Payer: Self-pay | Admitting: Internal Medicine

## 2017-08-14 ENCOUNTER — Other Ambulatory Visit (INDEPENDENT_AMBULATORY_CARE_PROVIDER_SITE_OTHER): Payer: BLUE CROSS/BLUE SHIELD

## 2017-08-14 DIAGNOSIS — D649 Anemia, unspecified: Secondary | ICD-10-CM

## 2017-08-14 LAB — COMPREHENSIVE METABOLIC PANEL
ALBUMIN: 2.5 g/dL — AB (ref 3.5–5.2)
ALT: 32 U/L (ref 0–53)
AST: 48 U/L — AB (ref 0–37)
Alkaline Phosphatase: 121 U/L — ABNORMAL HIGH (ref 39–117)
BILIRUBIN TOTAL: 6.4 mg/dL — AB (ref 0.2–1.2)
BUN: 11 mg/dL (ref 6–23)
CALCIUM: 9.1 mg/dL (ref 8.4–10.5)
CHLORIDE: 98 meq/L (ref 96–112)
CO2: 28 meq/L (ref 19–32)
Creatinine, Ser: 1.2 mg/dL (ref 0.40–1.50)
GFR: 66.76 mL/min (ref >60.00)
Glucose, Bld: 182 mg/dL — ABNORMAL HIGH (ref 70–99)
Potassium: 3.6 mEq/L (ref 3.5–5.1)
Sodium: 134 mEq/L — ABNORMAL LOW (ref 135–145)
Total Protein: 6.1 g/dL (ref 6.0–8.3)

## 2017-08-14 LAB — CBC WITH DIFFERENTIAL/PLATELET
BASOS PCT: 0.8 % (ref 0.0–3.0)
Basophils Absolute: 0.1 10*3/uL (ref 0.0–0.1)
EOS PCT: 1.3 % (ref 0.0–5.0)
Eosinophils Absolute: 0.1 10*3/uL (ref 0.0–0.7)
Lymphocytes Relative: 11.9 % — ABNORMAL LOW (ref 12.0–46.0)
Lymphs Abs: 0.9 10*3/uL (ref 0.7–4.0)
MCHC: 33.9 g/dL (ref 30.0–36.0)
MCV: 113.1 fl — AB (ref 78.0–100.0)
Monocytes Absolute: 1.5 10*3/uL — ABNORMAL HIGH (ref 0.1–1.0)
NEUTROS ABS: 5.2 10*3/uL (ref 1.4–7.7)
Neutrophils Relative %: 66.8 % (ref 43.0–77.0)
PLATELETS: 115 10*3/uL — AB (ref 150.0–400.0)
RBC: 2.14 Mil/uL — ABNORMAL LOW (ref 4.22–5.81)
RDW: 18.7 % — AB (ref 11.5–15.5)
WBC: 7.7 10*3/uL (ref 4.0–10.5)

## 2017-08-14 LAB — PROTIME-INR
INR: 2.6 ratio — AB (ref 0.8–1.0)
PROTHROMBIN TIME: 27.7 s — AB (ref 9.6–13.1)

## 2017-08-14 NOTE — Telephone Encounter (Signed)
Letter faxed per pt request. Pt requesting to have paracentesis scheduled. Please advise.

## 2017-08-15 ENCOUNTER — Other Ambulatory Visit: Payer: Self-pay

## 2017-08-15 DIAGNOSIS — K7031 Alcoholic cirrhosis of liver with ascites: Secondary | ICD-10-CM

## 2017-08-15 NOTE — Telephone Encounter (Signed)
Pt scheduled for IR para at [facility] 08/16/17@9am , pt to arrive there at 8:45am. Max amt to be drained is 6liters, pt to receive Albumin 50gm IV. Fluid to be sent for cell count and diff. Pts wife aware of appt.

## 2017-08-15 NOTE — Telephone Encounter (Signed)
Okay for paracentesis with IV albumin, we need to schedule paracentesis every 2 weeks on ongoing basis as long as ascites continues to be a problem and until he qualifies and receives transplant

## 2017-08-15 NOTE — Telephone Encounter (Signed)
6L, 50g IV albumin

## 2017-08-15 NOTE — Telephone Encounter (Signed)
Is there a max amt of fluid that you want drawn off?

## 2017-08-16 ENCOUNTER — Ambulatory Visit (HOSPITAL_COMMUNITY)
Admission: RE | Admit: 2017-08-16 | Discharge: 2017-08-16 | Disposition: A | Discharge status: Home / Self Care | Payer: BLUE CROSS/BLUE SHIELD | Source: Ambulatory Visit | Attending: Internal Medicine | Admitting: Internal Medicine

## 2017-08-16 ENCOUNTER — Encounter (HOSPITAL_COMMUNITY): Payer: Self-pay | Admitting: Interventional Radiology

## 2017-08-16 ENCOUNTER — Ambulatory Visit (INDEPENDENT_AMBULATORY_CARE_PROVIDER_SITE_OTHER): Payer: BLUE CROSS/BLUE SHIELD | Admitting: Psychiatry

## 2017-08-16 DIAGNOSIS — K7031 Alcoholic cirrhosis of liver with ascites: Secondary | ICD-10-CM | POA: Diagnosis not present

## 2017-08-16 DIAGNOSIS — F1020 Alcohol dependence, uncomplicated: Secondary | ICD-10-CM | POA: Diagnosis not present

## 2017-08-16 HISTORY — PX: IR PARACENTESIS: IMG2679

## 2017-08-16 LAB — BODY FLUID CELL COUNT WITH DIFFERENTIAL
Eos, Fluid: NONE SEEN %
LYMPHS FL: 31 %
MONOCYTE-MACROPHAGE-SEROUS FLUID: 68 % (ref 50–90)
Neutrophil Count, Fluid: 1 % (ref 0–25)
Total Nucleated Cell Count, Fluid: 84 cu mm (ref 0–1000)

## 2017-08-16 MED ORDER — LIDOCAINE HCL (PF) 1 % IJ SOLN
INTRAMUSCULAR | Status: AC
  Filled 2017-08-16: qty 30

## 2017-08-16 MED ORDER — ALBUMIN HUMAN 25 % IV SOLN
50.0000 g | Freq: Once | INTRAVENOUS | Status: AC
  Administered 2017-08-16: 50 g via INTRAVENOUS
  Filled 2017-08-16: qty 200

## 2017-08-16 MED ORDER — LIDOCAINE HCL 1 % IJ SOLN
INTRAMUSCULAR | Status: DC | PRN
  Administered 2017-08-16: 10 mL

## 2017-08-16 NOTE — Procedures (Signed)
PROCEDURE SUMMARY:  Successful US guided paracentesis from right lateral abdomen.  Yielded 2.6 liters of clear yellow fluid.  No immediate complications.  Pt tolerated well.   Specimen was sent for labs.  Armand Preast S Damion Kant PA-C 08/16/2017 10:39 AM

## 2017-08-17 LAB — PATHOLOGIST SMEAR REVIEW

## 2017-08-19 ENCOUNTER — Other Ambulatory Visit: Payer: Self-pay | Admitting: Internal Medicine

## 2017-08-22 ENCOUNTER — Encounter (HOSPITAL_COMMUNITY): Payer: Self-pay | Admitting: Emergency Medicine

## 2017-08-22 ENCOUNTER — Inpatient Hospital Stay (HOSPITAL_COMMUNITY)
Admission: EM | Admit: 2017-08-22 | Discharge: 2017-08-25 | DRG: 432 | Disposition: A | Discharge status: Home / Self Care | Payer: BLUE CROSS/BLUE SHIELD | Attending: Internal Medicine | Admitting: Internal Medicine

## 2017-08-22 DIAGNOSIS — I119 Hypertensive heart disease without heart failure: Secondary | ICD-10-CM | POA: Diagnosis present

## 2017-08-22 DIAGNOSIS — R791 Abnormal coagulation profile: Secondary | ICD-10-CM

## 2017-08-22 DIAGNOSIS — K766 Portal hypertension: Secondary | ICD-10-CM | POA: Diagnosis present

## 2017-08-22 DIAGNOSIS — K7290 Hepatic failure, unspecified without coma: Secondary | ICD-10-CM | POA: Diagnosis not present

## 2017-08-22 DIAGNOSIS — K921 Melena: Secondary | ICD-10-CM | POA: Diagnosis present

## 2017-08-22 DIAGNOSIS — D539 Nutritional anemia, unspecified: Secondary | ICD-10-CM | POA: Diagnosis present

## 2017-08-22 DIAGNOSIS — J918 Pleural effusion in other conditions classified elsewhere: Secondary | ICD-10-CM | POA: Diagnosis present

## 2017-08-22 DIAGNOSIS — R195 Other fecal abnormalities: Secondary | ICD-10-CM

## 2017-08-22 DIAGNOSIS — R188 Other ascites: Secondary | ICD-10-CM

## 2017-08-22 DIAGNOSIS — R0600 Dyspnea, unspecified: Secondary | ICD-10-CM

## 2017-08-22 DIAGNOSIS — D696 Thrombocytopenia, unspecified: Secondary | ICD-10-CM | POA: Diagnosis present

## 2017-08-22 DIAGNOSIS — R05 Cough: Secondary | ICD-10-CM

## 2017-08-22 DIAGNOSIS — R059 Cough, unspecified: Secondary | ICD-10-CM

## 2017-08-22 DIAGNOSIS — E785 Hyperlipidemia, unspecified: Secondary | ICD-10-CM | POA: Diagnosis present

## 2017-08-22 DIAGNOSIS — K7040 Alcoholic hepatic failure without coma: Principal | ICD-10-CM | POA: Diagnosis present

## 2017-08-22 DIAGNOSIS — K7682 Hepatic encephalopathy: Secondary | ICD-10-CM | POA: Diagnosis present

## 2017-08-22 DIAGNOSIS — K7210 Chronic hepatic failure without coma: Secondary | ICD-10-CM | POA: Diagnosis present

## 2017-08-22 DIAGNOSIS — Z79899 Other long term (current) drug therapy: Secondary | ICD-10-CM

## 2017-08-22 DIAGNOSIS — J189 Pneumonia, unspecified organism: Secondary | ICD-10-CM | POA: Diagnosis present

## 2017-08-22 DIAGNOSIS — D500 Iron deficiency anemia secondary to blood loss (chronic): Secondary | ICD-10-CM | POA: Diagnosis present

## 2017-08-22 DIAGNOSIS — K7031 Alcoholic cirrhosis of liver with ascites: Secondary | ICD-10-CM | POA: Diagnosis present

## 2017-08-22 DIAGNOSIS — K3189 Other diseases of stomach and duodenum: Secondary | ICD-10-CM | POA: Diagnosis present

## 2017-08-22 DIAGNOSIS — Z9889 Other specified postprocedural states: Secondary | ICD-10-CM

## 2017-08-22 DIAGNOSIS — K219 Gastro-esophageal reflux disease without esophagitis: Secondary | ICD-10-CM | POA: Diagnosis present

## 2017-08-22 DIAGNOSIS — Z888 Allergy status to other drugs, medicaments and biological substances status: Secondary | ICD-10-CM

## 2017-08-22 DIAGNOSIS — Z23 Encounter for immunization: Secondary | ICD-10-CM

## 2017-08-22 LAB — CBC WITH DIFFERENTIAL/PLATELET
BASOS PCT: 1 %
Basophils Absolute: 0.1 10*3/uL (ref 0.0–0.1)
EOS ABS: 0.1 10*3/uL (ref 0.0–0.7)
EOS PCT: 1 %
HCT: 19.6 % — ABNORMAL LOW (ref 39.0–52.0)
Hemoglobin: 6.8 g/dL — CL (ref 13.0–17.0)
Lymphocytes Relative: 12 %
Lymphs Abs: 1 10*3/uL (ref 0.7–4.0)
MCH: 38 pg — AB (ref 26.0–34.0)
MCHC: 34.7 g/dL (ref 30.0–36.0)
MCV: 109.5 fL — AB (ref 78.0–100.0)
MONO ABS: 1.6 10*3/uL — AB (ref 0.1–1.0)
Monocytes Relative: 20 %
NEUTROS ABS: 5.3 10*3/uL (ref 1.7–7.7)
Neutrophils Relative %: 66 %
PLATELETS: 97 10*3/uL — AB (ref 150–400)
RBC: 1.79 MIL/uL — ABNORMAL LOW (ref 4.22–5.81)
RDW: 15.8 % — ABNORMAL HIGH (ref 11.5–15.5)
WBC: 8.1 10*3/uL (ref 4.0–10.5)

## 2017-08-22 LAB — PROTIME-INR
INR: 2.64
PROTHROMBIN TIME: 28 s — AB (ref 11.4–15.2)

## 2017-08-22 NOTE — ED Notes (Signed)
Bed: WA17 Expected date:  Expected time:  Means of arrival:  Comments: [place]

## 2017-08-22 NOTE — ED Triage Notes (Signed)
Family states pt went to work today and was fine but when he came home this afternoon he was confused and disoriented  Wife states this happens when his ammonia level gets too high  Pt is jaundiced

## 2017-08-23 ENCOUNTER — Encounter (HOSPITAL_COMMUNITY): Payer: Self-pay | Admitting: Family Medicine

## 2017-08-23 ENCOUNTER — Other Ambulatory Visit: Payer: Self-pay | Admitting: Internal Medicine

## 2017-08-23 ENCOUNTER — Inpatient Hospital Stay (HOSPITAL_COMMUNITY): Payer: BLUE CROSS/BLUE SHIELD

## 2017-08-23 DIAGNOSIS — K7031 Alcoholic cirrhosis of liver with ascites: Secondary | ICD-10-CM

## 2017-08-23 DIAGNOSIS — K3189 Other diseases of stomach and duodenum: Secondary | ICD-10-CM | POA: Diagnosis present

## 2017-08-23 DIAGNOSIS — D696 Thrombocytopenia, unspecified: Secondary | ICD-10-CM

## 2017-08-23 DIAGNOSIS — R918 Other nonspecific abnormal finding of lung field: Secondary | ICD-10-CM | POA: Diagnosis not present

## 2017-08-23 DIAGNOSIS — J948 Other specified pleural conditions: Secondary | ICD-10-CM | POA: Diagnosis not present

## 2017-08-23 DIAGNOSIS — E785 Hyperlipidemia, unspecified: Secondary | ICD-10-CM | POA: Diagnosis present

## 2017-08-23 DIAGNOSIS — Z23 Encounter for immunization: Secondary | ICD-10-CM | POA: Diagnosis not present

## 2017-08-23 DIAGNOSIS — D539 Nutritional anemia, unspecified: Secondary | ICD-10-CM | POA: Diagnosis present

## 2017-08-23 DIAGNOSIS — K219 Gastro-esophageal reflux disease without esophagitis: Secondary | ICD-10-CM | POA: Diagnosis present

## 2017-08-23 DIAGNOSIS — Z888 Allergy status to other drugs, medicaments and biological substances status: Secondary | ICD-10-CM | POA: Diagnosis not present

## 2017-08-23 DIAGNOSIS — K7290 Hepatic failure, unspecified without coma: Secondary | ICD-10-CM | POA: Diagnosis not present

## 2017-08-23 DIAGNOSIS — J918 Pleural effusion in other conditions classified elsewhere: Secondary | ICD-10-CM | POA: Diagnosis present

## 2017-08-23 DIAGNOSIS — Z79899 Other long term (current) drug therapy: Secondary | ICD-10-CM | POA: Diagnosis not present

## 2017-08-23 DIAGNOSIS — K766 Portal hypertension: Secondary | ICD-10-CM | POA: Diagnosis present

## 2017-08-23 DIAGNOSIS — R195 Other fecal abnormalities: Secondary | ICD-10-CM

## 2017-08-23 DIAGNOSIS — D500 Iron deficiency anemia secondary to blood loss (chronic): Secondary | ICD-10-CM

## 2017-08-23 DIAGNOSIS — R791 Abnormal coagulation profile: Secondary | ICD-10-CM | POA: Diagnosis not present

## 2017-08-23 DIAGNOSIS — K921 Melena: Secondary | ICD-10-CM | POA: Diagnosis present

## 2017-08-23 DIAGNOSIS — I119 Hypertensive heart disease without heart failure: Secondary | ICD-10-CM | POA: Diagnosis present

## 2017-08-23 DIAGNOSIS — J90 Pleural effusion, not elsewhere classified: Secondary | ICD-10-CM | POA: Diagnosis not present

## 2017-08-23 DIAGNOSIS — J189 Pneumonia, unspecified organism: Secondary | ICD-10-CM | POA: Diagnosis present

## 2017-08-23 DIAGNOSIS — K7040 Alcoholic hepatic failure without coma: Secondary | ICD-10-CM | POA: Diagnosis present

## 2017-08-23 LAB — COMPREHENSIVE METABOLIC PANEL
ALBUMIN: 2.9 g/dL — AB (ref 3.5–5.0)
ALT: 40 U/L (ref 17–63)
AST: 63 U/L — AB (ref 15–41)
Alkaline Phosphatase: 111 U/L (ref 38–126)
Anion gap: 8 (ref 5–15)
BUN: 15 mg/dL (ref 6–20)
CHLORIDE: 97 mmol/L — AB (ref 101–111)
CO2: 28 mmol/L (ref 22–32)
Calcium: 9.5 mg/dL (ref 8.9–10.3)
Creatinine, Ser: 1.35 mg/dL — ABNORMAL HIGH (ref 0.61–1.24)
GFR calc Af Amer: >60 mL/min (ref >60)
GFR calc non Af Amer: 58 mL/min — ABNORMAL LOW (ref >60)
GLUCOSE: 124 mg/dL — AB (ref 65–99)
Potassium: 4.1 mmol/L (ref 3.5–5.1)
Sodium: 133 mmol/L — ABNORMAL LOW (ref 135–145)
Total Bilirubin: 8.4 mg/dL — ABNORMAL HIGH (ref 0.3–1.2)
Total Protein: 6.2 g/dL — ABNORMAL LOW (ref 6.5–8.1)

## 2017-08-23 LAB — URINALYSIS, ROUTINE W REFLEX MICROSCOPIC
Bilirubin Urine: NEGATIVE
GLUCOSE, UA: NEGATIVE mg/dL
Hgb urine dipstick: NEGATIVE
Ketones, ur: NEGATIVE mg/dL
LEUKOCYTES UA: NEGATIVE
Nitrite: NEGATIVE
PH: 6 (ref 5.0–8.0)
Protein, ur: NEGATIVE mg/dL
Specific Gravity, Urine: 1.016 (ref 1.005–1.030)

## 2017-08-23 LAB — BASIC METABOLIC PANEL
ANION GAP: 7 (ref 5–15)
BUN: 15 mg/dL (ref 6–20)
CHLORIDE: 99 mmol/L — AB (ref 101–111)
CO2: 26 mmol/L (ref 22–32)
Calcium: 8.9 mg/dL (ref 8.9–10.3)
Creatinine, Ser: 1.14 mg/dL (ref 0.61–1.24)
GFR calc Af Amer: >60 mL/min (ref >60)
GLUCOSE: 174 mg/dL — AB (ref 65–99)
POTASSIUM: 4.1 mmol/L (ref 3.5–5.1)
Sodium: 132 mmol/L — ABNORMAL LOW (ref 135–145)

## 2017-08-23 LAB — CBC
HCT: 21.4 % — ABNORMAL LOW (ref 39.0–52.0)
HEMOGLOBIN: 7.5 g/dL — AB (ref 13.0–17.0)
MCH: 35.9 pg — ABNORMAL HIGH (ref 26.0–34.0)
MCHC: 35 g/dL (ref 30.0–36.0)
MCV: 102.4 fL — ABNORMAL HIGH (ref 78.0–100.0)
Platelets: 77 10*3/uL — ABNORMAL LOW (ref 150–400)
RBC: 2.09 MIL/uL — AB (ref 4.22–5.81)
RDW: 20.5 % — ABNORMAL HIGH (ref 11.5–15.5)
WBC: 6.3 10*3/uL (ref 4.0–10.5)

## 2017-08-23 LAB — LIPASE, BLOOD: Lipase: 23 U/L (ref 11–51)

## 2017-08-23 LAB — MAGNESIUM
Magnesium: 1.4 mg/dL — ABNORMAL LOW (ref 1.7–2.4)
Magnesium: 1.5 mg/dL — ABNORMAL LOW (ref 1.7–2.4)

## 2017-08-23 LAB — PREPARE RBC (CROSSMATCH)

## 2017-08-23 LAB — AMMONIA
Ammonia: 107 umol/L — ABNORMAL HIGH (ref 9–35)
Ammonia: 75 umol/L — ABNORMAL HIGH (ref 9–35)

## 2017-08-23 MED ORDER — FOLIC ACID 1 MG PO TABS
1.0000 mg | ORAL_TABLET | Freq: Every day | ORAL | Status: DC
  Administered 2017-08-23 – 2017-08-24 (×2): 1 mg via ORAL
  Filled 2017-08-23 (×2): qty 1

## 2017-08-23 MED ORDER — ONDANSETRON HCL 4 MG/2ML IJ SOLN: 4.0000 mg | Freq: Four times a day (QID) | INTRAMUSCULAR | Status: DC | PRN

## 2017-08-23 MED ORDER — FERROUS SULFATE 325 (65 FE) MG PO TABS
325.0000 mg | ORAL_TABLET | Freq: Every evening | ORAL | Status: DC
  Administered 2017-08-23 – 2017-08-24 (×2): 325 mg via ORAL
  Filled 2017-08-23 (×2): qty 1

## 2017-08-23 MED ORDER — BACITRACIN ZINC 500 UNIT/GM EX OINT
TOPICAL_OINTMENT | CUTANEOUS | Status: AC
Start: 2017-08-23 — End: 2017-08-23
  Filled 2017-08-23: qty 0.9

## 2017-08-23 MED ORDER — DEXTROSE 5 % IV SOLN
2.0000 g | INTRAVENOUS | Status: DC
  Administered 2017-08-23 – 2017-08-25 (×3): 2 g via INTRAVENOUS
  Filled 2017-08-23 (×3): qty 2

## 2017-08-23 MED ORDER — FUROSEMIDE 40 MG PO TABS
40.0000 mg | ORAL_TABLET | Freq: Two times a day (BID) | ORAL | Status: DC
  Administered 2017-08-23: 40 mg via ORAL
  Filled 2017-08-23: qty 1

## 2017-08-23 MED ORDER — LACTULOSE 20 G PO PACK: 20.0000 g | PACK | Freq: Two times a day (BID) | ORAL | Status: DC

## 2017-08-23 MED ORDER — SODIUM CHLORIDE 0.9 % IV SOLN
10.0000 mL/h | Freq: Once | INTRAVENOUS | Status: AC
  Administered 2017-08-23: 10 mL/h via INTRAVENOUS

## 2017-08-23 MED ORDER — MAGNESIUM SULFATE 2 GM/50ML IV SOLN
2.0000 g | Freq: Once | INTRAVENOUS | Status: AC
  Administered 2017-08-23: 2 g via INTRAVENOUS
  Filled 2017-08-23: qty 50

## 2017-08-23 MED ORDER — VITAMIN B-1 100 MG PO TABS
100.0000 mg | ORAL_TABLET | Freq: Every day | ORAL | Status: DC
  Administered 2017-08-23 – 2017-08-25 (×3): 100 mg via ORAL
  Filled 2017-08-23 (×3): qty 1

## 2017-08-23 MED ORDER — LACTULOSE 10 GM/15ML PO SOLN
30.0000 g | Freq: Three times a day (TID) | ORAL | Status: DC
  Administered 2017-08-23 – 2017-08-25 (×7): 30 g via ORAL
  Administered 2017-08-25: 20 g via ORAL
  Filled 2017-08-23 (×8): qty 45

## 2017-08-23 MED ORDER — INFLUENZA VAC SPLIT QUAD 0.5 ML IM SUSY
0.5000 mL | PREFILLED_SYRINGE | INTRAMUSCULAR | Status: AC
  Administered 2017-08-24: 0.5 mL via INTRAMUSCULAR
  Filled 2017-08-23: qty 0.5

## 2017-08-23 MED ORDER — SODIUM CHLORIDE 0.9 % IV SOLN
Freq: Once | INTRAVENOUS | Status: AC
  Administered 2017-08-23: 17:00:00 via INTRAVENOUS

## 2017-08-23 MED ORDER — SPIRONOLACTONE 50 MG PO TABS
150.0000 mg | ORAL_TABLET | Freq: Every day | ORAL | Status: DC
  Filled 2017-08-23: qty 3

## 2017-08-23 MED ORDER — CIPROFLOXACIN HCL 500 MG PO TABS: 500.0000 mg | ORAL_TABLET | Freq: Every evening | ORAL | Status: DC

## 2017-08-23 MED ORDER — SUCRALFATE 1 G PO TABS
1.0000 g | ORAL_TABLET | Freq: Four times a day (QID) | ORAL | Status: DC
  Administered 2017-08-23 – 2017-08-25 (×10): 1 g via ORAL
  Filled 2017-08-23 (×10): qty 1

## 2017-08-23 MED ORDER — PANTOPRAZOLE SODIUM 40 MG PO TBEC
40.0000 mg | DELAYED_RELEASE_TABLET | Freq: Two times a day (BID) | ORAL | Status: DC
  Administered 2017-08-23 – 2017-08-25 (×6): 40 mg via ORAL
  Filled 2017-08-23 (×6): qty 1

## 2017-08-23 MED ORDER — LACTULOSE 10 GM/15ML PO SOLN
30.0000 g | Freq: Once | ORAL | Status: AC
  Administered 2017-08-23: 30 g via ORAL
  Filled 2017-08-23: qty 60

## 2017-08-23 NOTE — Progress Notes (Signed)
PROGRESS NOTE    Thomas [name]  CZY:[id number] DOB: 02/25/1961 DOA: 08/22/2017 PCP: Alroy Dust, L.Marlou Sa, MD    Brief Narrative: Thomas [name] is a 56 y.o. 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  He endorses adherence to his lactulose, regular BMs, no vomiting, no conspitation. No abdominal pain or fever.  No hematochezia, melena, hematemesis.  ED course: -Afebrile, heart rate 89, respirations 23, BP 121/69 and pulse ox normal -Na 133, K 4.1, Cr 1.35 (baseline 1.2), WBC 8.1K, Hgb 6.8 (most recently 8.2) -lipase normal -Bili 8 (baseline 6s), INR 2.6 (baseline) -Ammonia 106 -Mag 1.4 -UA unremarkable -FOBT negative -He was given lactulose and had a large BM -TRH were asked to evaluate for hepatic encephaloapthy and anemia     Assessment & Plan:   Principal Problem:   Hepatic encephalopathy (HCC) Active Problems:   Thrombocytopenia (HCC)   Iron deficiency anemia due to chronic blood loss   End stage liver disease (HCC)   Alcoholic cirrhosis of liver with ascites (HCC)   Hypomagnesemia  1-Acute hepatic encephalopathy;  Ammonia level elevated on admission, trending down today to 75.  Continue with lactulose.  He is alert and oriented.   ESLD;  Continue with lactulose.  Might need paracentesis, defer to GI IV ceftriaxone to cover for SBP, in event of encephalopathy.  GI consulted.  Resume diuretics.   Anemia;  Received one untit PRBC.  Hb increase to 7.5.  Repeat hb in am.  Will order  another unit   Hypomagnesemia; replete IV   Thrombocytopenia; monitor.   PNA; Cough; chest x ray with right lower lobe consolidation and pleural effusion.  Started on ceftriaxone to cover for PNA>   DVT prophylaxis: SCD Code Status: Full Code.  Family Communication: wife at bedside.  Disposition Plan: to be determine.   Consultants:  GI   Procedures: none  Antimicrobials: ceftriaxone.   Subjective: He was sleepy on my arrival, he wake up . He was able to answer questions.   Objective: Vitals:   08/23/17 0300 08/23/17 0358 08/23/17 0418 08/23/17 0733  BP: 120/65 114/61 108/61 (!) 115/56  Pulse: 90 87 88 90  Resp: 19 18 (!) 21 18  Temp: 98.3 F (36.8 C) 98.4 F (36.9 C) 98.1 F (36.7 C) 98 F (36.7 C)  TempSrc: Oral Oral Axillary Axillary  SpO2: 100% 97% 100% 96%  Weight: 79.9 kg (176 lb 2.4 oz)     Height: 5\' 5"  (1.651 m)       Intake/Output Summary (Last 24 hours) at 08/23/17 1002 Last data filed at 08/23/17 0724  Gross per 24 hour  Intake              717 ml  Output                0 ml  Net              717 ml   Filed Weights   08/23/17 0300  Weight: 79.9 kg (176  lb 2.4 oz)    Examination:  General exam: Appears calm and comfortable icteric  Respiratory system: Clear to auscultation. Respiratory effort normal. Cardiovascular system: S1 & S2 heard, RRR. No JVD, murmurs, rubs, gallops or clicks. No pedal edema. Gastrointestinal system: Abdomen is nondistended, soft and nontender. No organomegaly or masses felt. Normal bowel sounds heard. Central nervous system: Alert and oriented. No focal neurological deficits. Extremities: Symmetric 5 x 5 power. Skin: No rashes, lesions or ulcers     Data Reviewed: I have personally reviewed following labs and imaging studies  CBC:  Recent Labs Lab 08/22/17 2320  WBC 8.1  NEUTROABS 5.3  HGB 6.8*  HCT 19.6*  MCV 109.5*  PLT 97*   Basic Metabolic Panel:  Recent Labs Lab 08/22/17 2320 08/23/17 0011    NA 133*  --   K 4.1  --   CL 97*  --   CO2 28  --   GLUCOSE 124*  --   BUN 15  --   CREATININE 1.35*  --   CALCIUM 9.5  --   MG  --  1.4*   GFR: Estimated Creatinine Clearance: 60.3 mL/min (A) (by C-G formula based on SCr of 1.35 mg/dL (H)). Liver Function Tests:  Recent Labs Lab 08/22/17 2320  AST 63*  ALT 40  ALKPHOS 111  BILITOT 8.4*  PROT 6.2*  ALBUMIN 2.9*    Recent Labs Lab 08/22/17 2320  LIPASE 23    Recent Labs Lab 08/22/17 2321  AMMONIA 107*   Coagulation Profile:  Recent Labs Lab 08/22/17 2320  INR 2.64   Cardiac Enzymes: No results for input(s): CKTOTAL, CKMB, CKMBINDEX, TROPONINI in the last 168 hours. BNP (last 3 results) No results for input(s): PROBNP in the last 8760 hours. HbA1C: No results for input(s): HGBA1C in the last 72 hours. CBG: No results for input(s): GLUCAP in the last 168 hours. Lipid Profile: No results for input(s): CHOL, HDL, LDLCALC, TRIG, CHOLHDL, LDLDIRECT in the last 72 hours. Thyroid Function Tests: No results for input(s): TSH, T4TOTAL, FREET4, T3FREE, THYROIDAB in the last 72 hours. Anemia Panel: No results for input(s): VITAMINB12, FOLATE, FERRITIN, TIBC, IRON, RETICCTPCT in the last 72 hours. Sepsis Labs: No results for input(s): PROCALCITON, LATICACIDVEN in the last 168 hours.  No results found for this or any previous visit (from the past 240 hour(s)).       Radiology Studies: No results found.      Scheduled Meds: . bacitracin      . ciprofloxacin  500 mg Oral QPM  . ferrous sulfate  325 mg Oral QPM  . folic acid  1 mg Oral QHS  . [START ON 08/24/2017] Influenza vac split quadrivalent PF  0.5 mL Intramuscular Tomorrow-1000  . lactulose  30 g Oral TID  . pantoprazole  40 mg Oral BID AC  . sucralfate  1 g Oral QID  . thiamine  100 mg Oral Daily   Continuous Infusions:   LOS: 0 days    Time spent: 35 minutes    Elmarie Shiley, MD [facility] Pager [phone]  If  7PM-7AM, please contact night-coverage [email or web address] Password [email or web address] 08/23/2017, 10:02 AM

## 2017-08-23 NOTE — Telephone Encounter (Signed)
Ok to refill Carafate Patient hospitalized currently

## 2017-08-23 NOTE — H&P (Signed)
History and Physical  Patient Name: Thomas [name]     FOY:[id number]    DOB: 1961-01-19    DOA: 08/22/2017 PCP: Alroy Dust, L.Marlou Sa, MD  Gastroenterology: Dr. Hilarie Fredrickson  Patient coming from: Home  Chief Complaint: Confusion      HPI: Thomas [name] is a 56 y.o. 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  He endorses adherence to his lactulose, regular BMs, no vomiting, no conspitation. No abdominal pain or fever.  No hematochezia, melena, hematemesis.  ED course: -Afebrile, heart rate 89, respirations 23, BP 121/69 and pulse ox normal -Na 133, K 4.1, Cr 1.35 (baseline 1.2), WBC 8.1K, Hgb 6.8 (most recently 8.2) -lipase normal -Bili 8 (baseline 6s), INR 2.6 (baseline) -Ammonia 106 -Mag 1.4 -UA unremarkable -FOBT negative -He was given lactulose and had a large BM -TRH were asked to evaluate for hepatic encephaloapthy and anemia     ROS: Review of Systems  Constitutional: Positive for malaise/fatigue. Negative for fever.  Gastrointestinal: Negative for abdominal pain, blood in stool, constipation, melena and vomiting.  Psychiatric/Behavioral: Positive for memory loss (confusion).  All other systems reviewed and are negative.         Past Medical History:  Diagnosis Date  . Alcohol abuse 2013  . Alcoholic cirrhosis ([place]) 06/6719  . Anemia 02/2016   in setting of GI blood loss, but MCV macrocytic.   . Coagulopathy ([place]) 05/2015  . Diverticulosis 09/2015  . Esophageal varices ([place]) 09/2015   small  .  GERD (gastroesophageal reflux disease)   . Hyperlipidemia   . Hypertension   . Pneumonia   . Portal hypertension ([place]) 09/2015   with portal gastropathy  . Thrombocytopenia ([place]) 02/2016  . Tubular adenoma of colon 09/2015    Past Surgical History:  Procedure Laterality Date  . ESOPHAGOGASTRODUODENOSCOPY N/A 03/05/2016   Procedure: ESOPHAGOGASTRODUODENOSCOPY (EGD);  Surgeon: Ladene Artist, MD;  Location: [name];  Service: Endoscopy;  Laterality: N/A;  . ESOPHAGOGASTRODUODENOSCOPY N/A 04/29/2017   Procedure: ESOPHAGOGASTRODUODENOSCOPY (EGD);  Surgeon: Milus Banister, MD;  Location: [name];  Service: Endoscopy;  Laterality: N/A;  . ESOPHAGOGASTRODUODENOSCOPY (EGD) WITH PROPOFOL N/A 03/29/2017   Procedure: ESOPHAGOGASTRODUODENOSCOPY (EGD) WITH PROPOFOL;  Surgeon: Doran Stabler, MD;  Location: [facility];  Service: Endoscopy;  Laterality: N/A;  . IR PARACENTESIS  05/15/2017  . IR PARACENTESIS  06/11/2017  . IR PARACENTESIS  06/26/2017  . IR PARACENTESIS  07/10/2017  . IR PARACENTESIS  08/16/2017  . IR THORACENTESIS ASP PLEURAL SPACE W/IMG GUIDE  06/28/2017  . testicle prosthesis      Social History: Patient lives with his wife.  The patient walks unassisted.  Nonsmoker.  From [place].  Works in Press [employer].  Allergies  Allergen Reactions  . Rifaximin Swelling and Other (See Comments)    Reaction:  All over body swelling     Family history: family history includes Diabetes in his father and paternal aunt; Glaucoma in his mother; Parkinson's disease in his father.  Prior to Admission  medications   Medication Sig Start Date End Date Taking? Authorizing Provider  bismuth subsalicylate (PEPTO BISMOL) 262 MG chewable tablet Chew 262-524 mg by mouth 3 (three) times daily as needed for indigestion.   Yes [provider]  calcium carbonate (TUMS EX) 750 MG chewable tablet Chew 1-2 tablets by mouth 3 (three) times daily as needed for heartburn.   Yes [provider]    calcium-vitamin D (OSCAL WITH D) 500-200 MG-UNIT tablet Take 1 tablet by mouth 2 (two) times daily.   Yes [provider]  ciprofloxacin (CIPRO) 500 MG tablet TAKE 1 TABLET BY MOUTH EVERY EVENING 08/20/17  Yes Pyrtle, Lajuan Lines, MD  Dextromethorphan HBr (TUSSIN COUGH PO) Take 2 capsules by mouth daily as needed (cough).   Yes [provider]  ergocalciferol (VITAMIN D2) 50000 units capsule Take 50,000 Units by mouth once a week.   Yes [provider]  ferrous sulfate 325 (65 FE) MG tablet Take 1 tablet (325 mg total) by mouth 3 (three) times daily with meals. Patient taking differently: Take 325 mg by mouth every evening.  04/30/17  Yes Patrecia Pour, Christean Grief, MD  folic acid (FOLVITE) 1 MG tablet TAKE 1 TABLET BY MOUTH AT BEDTIME 08/20/17  Yes Pyrtle, Lajuan Lines, MD  furosemide (LASIX) 20 MG tablet Take 2 tablets (40 mg total) by mouth 2 (two) times daily. 07/06/17  Yes Pyrtle, Lajuan Lines, MD  lactulose (CEPHULAC) 20 g packet Take 1 packet (20 g total) by mouth 2 (two) times daily. 08/06/17  Yes Pyrtle, Lajuan Lines, MD  lactulose (CEPHULAC) 20 g packet Take 20 g by mouth 2 (two) times daily.   Yes [provider]  pantoprazole (PROTONIX) 40 MG tablet TAKE 1 TABLET (40 MG TOTAL) BY MOUTH 2 (TWO) TIMES DAILY. 07/06/17  Yes Pyrtle, Lajuan Lines, MD  spironolactone (ALDACTONE) 100 MG tablet Take 1.5 tablets (150 mg total) by mouth daily. 07/06/17  Yes Pyrtle, Lajuan Lines, MD  sucralfate (CARAFATE) 1 g tablet Take 1 tablet (1 g total) by mouth 4 (four) times daily. 06/29/17 06/29/18 Yes Robbie Lis, MD  thiamine 100 MG tablet Take 1 tablet (100 mg total) by mouth daily. 06/30/17  Yes Robbie Lis, MD       Physical Exam: BP 108/61   Pulse 88   Temp 98.1 F (36.7 C) (Axillary)   Resp (!) 21   Ht 5\' 5"  (1.651 m)   Wt 79.9 kg (176 lb 2.4 oz)   SpO2 100%   BMI 29.31 kg/m  General appearance: Adult male, sleeping, rousable, falls back asleep. Eyes: Icteric, conjunctiva pink, lids and lashes normal.  PERRL.    ENT: No nasal deformity, discharge, epistaxis.  Hearing normal. OP tacky dry without lesions.   Neck: No neck masses.  Trachea midline.  No thyromegaly/tenderness. Lymph: No cervical or supraclavicular lymphadenopathy. Skin: Warm and dry.  Jaundice noted.  No suspicious rashes or lesions. Cardiac: Tachycardic, regular, nl S1-S2, no murmurs appreciated.  Capillary refill is brisk.  JVP not visible.  Mild nonpitting LE edema.  Radial pulses 2+ and symmetric. Respiratory: Normal respiratory rate and rhythm.  CTAB without rales or wheezes. Abdomen: Abdomen soft.  No TTP. Moderate, non-tense ascites.   MSK: No deformities or effusions.  No cyanosis or clubbing. Neuro: Cranial nerves grossly normal.  Sensation intact to light touch. Speech is fluent.  Muscle strength normal.    Psych: Sensorium intact and responding to questions, but falls back asleep, attention dimimshed.   Affect blunted.  Judgment and insight appear normal.     Labs on Admission:  I have personally reviewed following labs and imaging studies: CBC:  Recent Labs Lab 08/22/17 2320  WBC 8.1  NEUTROABS 5.3  HGB 6.8*  HCT 19.6*  MCV 109.5*  PLT 97*   Basic Metabolic Panel:  Recent Labs Lab 08/22/17 2320 08/23/17 0011  NA 133*  --   K 4.1  --   CL 97*  --   CO2 28  --   GLUCOSE 124*  --   BUN 15  --   CREATININE 1.35*  --   CALCIUM 9.5  --   MG  --  1.4*   GFR: Estimated Creatinine Clearance: 60.3 mL/min (A) (by C-G formula based on SCr of 1.35 mg/dL (H)).  Liver Function Tests:  Recent Labs Lab 08/22/17 2320  AST 63*  ALT 40  ALKPHOS 111  BILITOT 8.4*  PROT 6.2*  ALBUMIN 2.9*    Recent Labs Lab 08/22/17 2320  LIPASE 23    Recent Labs Lab 08/22/17 2321  AMMONIA 107*   Coagulation Profile:  Recent Labs Lab 08/22/17 2320  INR 2.64   Cardiac Enzymes: No results for input(s): CKTOTAL, CKMB, CKMBINDEX, TROPONINI in the last 168 hours. BNP (last 3 results) No results for  input(s): PROBNP in the last 8760 hours. HbA1C: No results for input(s): HGBA1C in the last 72 hours. CBG: No results for input(s): GLUCAP in the last 168 hours. Lipid Profile: No results for input(s): CHOL, HDL, LDLCALC, TRIG, CHOLHDL, LDLDIRECT in the last 72 hours. Thyroid Function Tests: No results for input(s): TSH, T4TOTAL, FREET4, T3FREE, THYROIDAB in the last 72 hours. Anemia Panel: No results for input(s): VITAMINB12, FOLATE, FERRITIN, TIBC, IRON, RETICCTPCT in the last 72 hours. Sepsis Labs: Invalid input(s): PROCALCITONIN, LACTICIDVEN No results found for this or any previous visit (from the past 240 hour(s)).         Assessment/Plan  1. Hepatic encephalopathy:  Unclear precipitant, anemia?  Doubt SBP.  He is rifaximin intolerant. -Lactulose, additional dose now -Transfuse 1 unit -Hold diuretics one day    2. End stage liver disease:  MELD 30. -Continue cipro for ppx of SBP -Consult GI -Restart diuretics tomorrow  3. Anemia from chronic blood loss:  -Continue PPI, sucralfate -Transfuse 1 unit now -Post-transfusion CBC -Continue iron  4. Hypomagnesemia:  Mag supplemented in ER  5. THrombocytopenia: Stable     DVT prophylaxis: SCDs  Code Status: FULL  Family Communication: Wife at bedside  Disposition Plan: Anticipate Lactulose and GI consult, further disposition pending re-eval of mentation Consults called: None overnight Admission status: INPATIENT    Medical decision making: Patient seen at 4:20 AM on 08/23/2017.  The patient was discussed with Dr. Roxanne Mins.  What exists of the patient's chart was reviewed in depth and summarized above.  Clinical condition: stable.        Edwin Dada [facility] Pager [phone]     At the time of admission, it appears that the appropriate admission status for this patient is INPATIENT. This is judged to be reasonable and necessary in order to provide the required intensity of service to  ensure the patient's safety given the presenting symptoms, physical exam findings, and initial radiographic and laboratory data in the context of their chronic comorbidities.  Together, these circumstances are felt to place him at high risk for further clinical deterioration threatening life, limb, or organ.   Patient requires inpatient status due to high intensity of service, high risk  for further deterioration and high frequency of surveillance required because of this severe exacerbation of their chronic organ failure.  I certify that at the point of admission it is my clinical judgment that the patient will require inpatient hospital care spanning beyond 2 midnights from the point of admission and that early discharge would result in unnecessary risk of decompensation and readmission or threat to life, limb or bodily function.

## 2017-08-23 NOTE — Care Management Note (Signed)
Case Management Note  Patient Details  Name: Thomas [name] MRN: [id number] Date of Birth: 12-Dec-1960  Subjective/Objective: 56 y/o m admitted w/Hepatic encephalopathy. From home.                   Action/Plan:d/c plan home.   Expected Discharge Date:                  Expected Discharge Plan:  Home/Self Care  In-House Referral:     Discharge planning Services  CM Consult  Post Acute Care Choice:    Choice offered to:     DME Arranged:    DME Agency:     HH Arranged:    HH Agency:     Status of Service:  In process, will continue to follow  If discussed at Long Length of Stay Meetings, dates discussed:    Additional Comments:  Dessa Phi, RN 08/23/2017, 10:52 AM

## 2017-08-23 NOTE — ED Provider Notes (Signed)
[facility] DEPT Provider Note   CSN: [id number] Arrival date & time: 08/22/17  2050     History   Chief Complaint Chief Complaint  Patient presents with  . Altered Mental Status    HPI Thomas [name] is a 56 y.o. male.  The history is provided by the spouse. The history is limited by the condition of the patient (altered mental status).  His wife states that he was his normal self this morning, but when she saw him at about noon, he had a blank stare in his face. This typically happens when his hemoglobin gets low. He has a history of alcoholic cirrhosis, and is on a list waiting for a liver transplant.He does take lactulose at home, and she states that she had been giving it to him over the weekend. She had been away for the last 2 days, does not know if he had taken it during that time. There is been no fever or chills. There has been no nausea or vomiting.  Past Medical History:  Diagnosis Date  . Alcohol abuse 2013  . Alcoholic cirrhosis ([place]) 06/4258  . Anemia 02/2016   in setting of GI blood loss, but MCV macrocytic.   . Coagulopathy ([place]) 05/2015  . Diverticulosis 09/2015  . Esophageal varices ([place]) 09/2015   small  . GERD (gastroesophageal reflux disease)   . Hyperlipidemia   . Hypertension   . Pneumonia   . Portal hypertension ([place]) 09/2015   with portal gastropathy  . Thrombocytopenia ([place]) 02/2016  . Tubular adenoma of colon 09/2015    Patient Active Problem List   Diagnosis Date Noted  . Acute upper GI bleed 06/13/2017  . Encephalopathy acute 04/27/2017  . HCAP (healthcare-associated pneumonia) 04/27/2017  . Acute renal failure (ARF) ([place]) 03/28/2017  . Hyperglycemia 03/28/2017  . Hypoalbuminemia 03/28/2017  . Edema 03/28/2017  . S/P thoracentesis   . Symptomatic anemia   . Dyspnea 03/03/2017  . Hypomagnesemia 11/26/2016  . Acute respiratory failure with hypoxia ([place]) 11/24/2016  . Pleural effusion 11/24/2016  . Hypokalemia 11/24/2016  . SBP  (spontaneous bacterial peritonitis) ([place]) 05/09/2016  . Severe sepsis ([place]) 05/09/2016  . Decompensation of cirrhosis of liver ([place]) 05/07/2016  . Hypotension 05/07/2016  . Ascites due to alcoholic cirrhosis ([place]) 56/38/7564  . Localized edema 04/17/2016  . Alcoholic cirrhosis of liver with ascites ([place])   . Bleeding gastrointestinal   . End stage liver disease ([place])   . Upper GI bleed 03/05/2016  . Coagulopathy ([place]) 03/05/2016  . Acute kidney injury ([place]) 03/05/2016  . Hepatic encephalopathy ([place]) 03/05/2016  . Macrocytic anemia 03/05/2016  . Thrombocytopenia ([place]) 03/05/2016  . Esophageal varices ([place]) 03/05/2016  . Acute blood loss anemia 03/05/2016  . Idiopathic esophageal varices without bleeding ([place])   . Portal hypertensive gastropathy ([place])   . Encephalopathy, metabolic 33/29/5188    Past Surgical History:  Procedure Laterality Date  . ESOPHAGOGASTRODUODENOSCOPY N/A 03/05/2016   Procedure: ESOPHAGOGASTRODUODENOSCOPY (EGD);  Surgeon: Ladene Artist, MD;  Location: [name];  Service: Endoscopy;  Laterality: N/A;  . ESOPHAGOGASTRODUODENOSCOPY N/A 04/29/2017   Procedure: ESOPHAGOGASTRODUODENOSCOPY (EGD);  Surgeon: Milus Banister, MD;  Location: [name];  Service: Endoscopy;  Laterality: N/A;  . ESOPHAGOGASTRODUODENOSCOPY (EGD) WITH PROPOFOL N/A 03/29/2017   Procedure: ESOPHAGOGASTRODUODENOSCOPY (EGD) WITH PROPOFOL;  Surgeon: Doran Stabler, MD;  Location: [facility];  Service: Endoscopy;  Laterality: N/A;  . IR PARACENTESIS  05/15/2017  . IR PARACENTESIS  06/11/2017  . IR PARACENTESIS  06/26/2017  .  IR PARACENTESIS  07/10/2017  . IR PARACENTESIS  08/16/2017  . IR THORACENTESIS ASP PLEURAL SPACE W/IMG GUIDE  06/28/2017  . testicle prosthesis         Home Medications    Prior to Admission medications   Medication Sig Start Date End Date Taking? Authorizing Provider  bismuth subsalicylate (PEPTO BISMOL) 262 MG chewable tablet Chew 262-524 mg by mouth 3 (three)  times daily as needed for indigestion.    [provider]  calcium carbonate (TUMS EX) 750 MG chewable tablet Chew 1-2 tablets by mouth 3 (three) times daily as needed for heartburn.    [provider]  calcium-vitamin D (OSCAL WITH D) 500-200 MG-UNIT tablet Take 1 tablet by mouth 2 (two) times daily.    [provider]  ciprofloxacin (CIPRO) 500 MG tablet TAKE 1 TABLET BY MOUTH EVERY EVENING 08/20/17   Pyrtle, Lajuan Lines, MD  Dextromethorphan HBr (TUSSIN COUGH PO) Take 2 capsules by mouth daily as needed (cough).    [provider]  ergocalciferol (VITAMIN D2) 50000 units capsule Take 50,000 Units by mouth once a week.    [provider]  ferrous sulfate 325 (65 FE) MG tablet Take 1 tablet (325 mg total) by mouth 3 (three) times daily with meals. Patient taking differently: Take 325 mg by mouth every evening.  04/30/17   Patrecia Pour, Christean Grief, MD  folic acid (FOLVITE) 1 MG tablet TAKE 1 TABLET BY MOUTH AT BEDTIME 08/20/17   Pyrtle, Lajuan Lines, MD  furosemide (LASIX) 20 MG tablet Take 2 tablets (40 mg total) by mouth 2 (two) times daily. 07/06/17   Pyrtle, Lajuan Lines, MD  lactulose (CEPHULAC) 20 g packet Take 1 packet (20 g total) by mouth 2 (two) times daily. 08/06/17   Pyrtle, Lajuan Lines, MD  lactulose (CHRONULAC) 10 GM/15ML solution Take 30 mLs (20 g total) by mouth 2 (two) times daily. 07/06/17   Pyrtle, Lajuan Lines, MD  pantoprazole (PROTONIX) 40 MG tablet TAKE 1 TABLET (40 MG TOTAL) BY MOUTH 2 (TWO) TIMES DAILY. 07/06/17   Pyrtle, Lajuan Lines, MD  potassium chloride (K-DUR,KLOR-CON) 10 MEQ tablet Take 1 tablet (10 mEq total) by mouth daily. 06/16/17   Dessa Phi Chahn-Yang, DO  spironolactone (ALDACTONE) 100 MG tablet Take 1.5 tablets (150 mg total) by mouth daily. 07/06/17   Pyrtle, Lajuan Lines, MD  sucralfate (CARAFATE) 1 g tablet Take 1 tablet (1 g total) by mouth 4 (four) times daily. 06/29/17 06/29/18  Robbie Lis, MD  thiamine 100 MG tablet Take 1 tablet (100 mg total) by mouth daily.  06/30/17   Robbie Lis, MD    Family History Family History  Problem Relation Age of Onset  . Diabetes Father   . Parkinson's disease Father   . Diabetes Paternal Aunt   . Glaucoma Mother     Social History Social History  Substance Use Topics  . Smoking status: Never Smoker  . Smokeless tobacco: Never Used  . Alcohol use No     Comment: Quit 2/17, relapse in 1/18 for short period of time     Allergies   Rifaximin   Review of Systems Review of Systems  Unable to perform ROS: Mental status change     Physical Exam Updated Vital Signs BP 121/69 (BP Location: Right Arm)   Pulse 89   Temp 98 F (36.7 C) (Oral)   Resp 18   SpO2 100%   Physical Exam  Nursing note and vitals reviewed.  56 year old  male, resting comfortably and in no acute distress. Vital signs are normal. Oxygen saturation is 100%, which is normal. Head is normocephalic and atraumatic. PERRLA, EOMI. Oropharynx is clear. Sclerae are moderately icteric. Neck is nontender and supple without adenopathy or JVD. Back is nontender and there is no CVA tenderness. Lungs are clear without rales, wheezes, or rhonchi. Chest is nontender. Heart has regular rate and rhythm without murmur. Abdomen is soft, nontender without masses or hepatosplenomegaly and peristalsis is normoactive. Abdomen is somewhat protuberant with fluid wave present. No umbilical hernia. No caput medusae. Rectal: Normal sphincter tone. Red-tinged stool present. Extremities have 3+ edema, full range of motion is present. Skin is warm and dry without rash. Neurologic: He is awake, but slow to answer questions. He is oriented to person and place, but not time. Cranial nerves are intact, there are no motor or sensory deficits.  ED Treatments / Results  Labs (all labs ordered are listed, but only abnormal results are displayed) Labs Reviewed  COMPREHENSIVE METABOLIC PANEL - Abnormal; Notable for the following:       Result Value   Sodium  133 (*)    Chloride 97 (*)    Glucose, Bld 124 (*)    Creatinine, Ser 1.35 (*)    Total Protein 6.2 (*)    Albumin 2.9 (*)    AST 63 (*)    Total Bilirubin 8.4 (*)    GFR calc non Af Amer 58 (*)    All other components within normal limits  CBC WITH DIFFERENTIAL/PLATELET - Abnormal; Notable for the following:    RBC 1.79 (*)    Hemoglobin 6.8 (*)    HCT 19.6 (*)    MCV 109.5 (*)    MCH 38.0 (*)    RDW 15.8 (*)    Platelets 97 (*)    Monocytes Absolute 1.6 (*)    All other components within normal limits  URINALYSIS, ROUTINE W REFLEX MICROSCOPIC - Abnormal; Notable for the following:    Color, Urine AMBER (*)    All other components within normal limits  PROTIME-INR - Abnormal; Notable for the following:    Prothrombin Time 28.0 (*)    All other components within normal limits  AMMONIA - Abnormal; Notable for the following:    Ammonia 107 (*)    All other components within normal limits  MAGNESIUM - Abnormal; Notable for the following:    Magnesium 1.4 (*)    All other components within normal limits  LIPASE, BLOOD  POC OCCULT BLOOD, ED  TYPE AND SCREEN  PREPARE RBC (CROSSMATCH)    Procedures Procedures (including critical care time) CRITICAL CARE Performed by: HDQQI,WLNLG Total critical care time: 45 minutes Critical care time was exclusive of separately billable procedures and treating other patients. Critical care was necessary to treat or prevent imminent or life-threatening deterioration. Critical care was time spent personally by me on the following activities: development of treatment plan with patient and/or surrogate as well as nursing, discussions with consultants, evaluation of patient's response to treatment, examination of patient, obtaining history from patient or surrogate, ordering and performing treatments and interventions, ordering and review of laboratory studies, ordering and review of radiographic studies, pulse oximetry and re-evaluation of patient's  condition.  Medications Ordered in ED Medications  bacitracin 500 UNIT/GM ointment (not administered)  magnesium sulfate IVPB 2 g 50 mL (not administered)  lactulose (CHRONULAC) 10 GM/15ML solution 30 g (30 g Oral Given 08/23/17 0149)  0.9 %  sodium chloride infusion (10 mL/hr  Intravenous New Bag/Given 08/23/17 0155)     Initial Impression / Assessment and Plan / ED Course  I have reviewed the triage vital signs and the nursing notes.  Pertinent labs & imaging results that were available during my care of the patient were reviewed by me and considered in my medical decision making (see chart for details).  Altered mental status in patient with known cirrhosis. Old records are reviewed, and he had been admitted in July of this year at which time he received blood and had paracentesis and thoracentesis. Hemoglobin is noted to have fallen significantly. INR is moderately elevated consistent with cirrhosis. Stool sent for Hemoccult testing. Of note, his wife states he does have a history of varices, but has not had any GI bleeding.  Ammonia level is significantly elevated. Hemoccult has come back positive. I believe that his bleeding is responsible for his hepatic encephalopathy. No evidence of any infection. Creatinine is mildly elevated compared with baseline. Bilirubin is significantly elevated, but not significantly changed from baseline. INR is elevated consistent with history of cirrhosis, and not changed from baseline. He is given a dose of lactulose. Magnesium is come back low, and he is given intravenous magnesium. Blood transfusion is ordered. Case is discussed with Dr. Loleta Books of [facility] who agrees to admit the patient.  Final Clinical Impressions(s) / ED Diagnoses   Final diagnoses:  Hepatic encephalopathy ([place])  Guaiac positive stools  Elevated INR  Macrocytic anemia  Hypomagnesemia    New Prescriptions New Prescriptions   No medications on file     Delora Fuel,  MD [id number]

## 2017-08-23 NOTE — Consult Note (Signed)
Consultation  Referring Provider: Dr. Tyrell Antonio     Primary Care Physician:  Alroy Dust, Carlean Jews.Marlou Sa, MD Primary Gastroenterologist: Dr. Hilarie Fredrickson        Reason for Consultation:  Hepatic Ecephalopathy , melena, anemia           HPI:   Thomas [name] is a 56 y.o. Caucasian male with a past medical history of alcoholic cirrhosis and anemia in the setting of GI blood loss, esophageal varices, reflux and portal hypertension, as well as others listed below, who presented to the ER today with a complaint of "confusion".   Today, per the patient's wife patient was doing well at home and actually "had perked up a bit", she went away on a business trip Monday morning and when she came back Tuesday night her husband walked in the door from work, and " I could tell he was confused". Apparently, he was mumbling and "not talking right". She brought him to the ER. His wife provides much of his history and explains that typically he uses his lactulose twice a day at home and had been doing this until she left. He had a paracentesis last week as ordered via our outpatient clinic and had been doing well. Patient describes noting occasional "little bit of black stool" here and there. Patient notes that he had increasing fatigue and weakness over the past week. "He would just come home from work and fall on the couch". Patient also has some chronic shortness of breath which seem to worsen.   Patient denies fever, chills, bright red blood in his stool, nausea, vomiting or abdominal pain.  Most recent GI history: -08/16/17- paracentesis: 2.6 L removed -07/06/17-office visit, Dr. Hilarie Fredrickson: At that time patient was doing well, 2- 3 bowel movements a day with lactulose twice a day, continue some lower extremity swelling but denied pain, Aldactone 150 daily and Lasix 40 twice a day, also Cipro for daily SBP prophylaxis, undergoing substance abuse counseling-plan: Scheduled paracentesis with IV albumin every 2 weeks -Patient is being  followed by [facility] for possible liver transplant, time of last review patient was not alcohol free and is currently undergoing therapy with plans for review in October -04/29/17-EGD, Dr. Ardis Hughs: Moderate to severe portal gastropathy thought to be the source of his chronic bleeding, small esophageal varices- patient was started on iron  Past Medical History:  Diagnosis Date  . Alcohol abuse 2013  . Alcoholic cirrhosis ([place]) 06/622  . Anemia 02/2016   in setting of GI blood loss, but MCV macrocytic.   . Coagulopathy ([place]) 05/2015  . Diverticulosis 09/2015  . Esophageal varices ([place]) 09/2015   small  . GERD (gastroesophageal reflux disease)   . Hyperlipidemia   . Hypertension   . Pneumonia   . Portal hypertension ([place]) 09/2015   with portal gastropathy  . Thrombocytopenia ([place]) 02/2016  . Tubular adenoma of colon 09/2015    Past Surgical History:  Procedure Laterality Date  . ESOPHAGOGASTRODUODENOSCOPY N/A 03/05/2016   Procedure: ESOPHAGOGASTRODUODENOSCOPY (EGD);  Surgeon: Ladene Artist, MD;  Location: [name];  Service: Endoscopy;  Laterality: N/A;  . ESOPHAGOGASTRODUODENOSCOPY N/A 04/29/2017   Procedure: ESOPHAGOGASTRODUODENOSCOPY (EGD);  Surgeon: Milus Banister, MD;  Location: [name];  Service: Endoscopy;  Laterality: N/A;  . ESOPHAGOGASTRODUODENOSCOPY (EGD) WITH PROPOFOL N/A 03/29/2017   Procedure: ESOPHAGOGASTRODUODENOSCOPY (EGD) WITH PROPOFOL;  Surgeon: Doran Stabler, MD;  Location: [facility];  Service: Endoscopy;  Laterality: N/A;  . IR PARACENTESIS  05/15/2017  . IR PARACENTESIS  06/11/2017  . IR PARACENTESIS  06/26/2017  . IR PARACENTESIS  07/10/2017  . IR PARACENTESIS  08/16/2017  . IR THORACENTESIS ASP PLEURAL SPACE W/IMG GUIDE  06/28/2017  . testicle prosthesis      Family History  Problem Relation Age of Onset  . Diabetes Father   . Parkinson's disease Father   . Diabetes Paternal Aunt   . Glaucoma Mother      Social History  Substance Use Topics  .  Smoking status: Never Smoker  . Smokeless tobacco: Never Used  . Alcohol use No     Comment: Quit 2/17, relapse in 1/18 for short period of time    Prior to Admission medications   Medication Sig Start Date End Date Taking? Authorizing Provider  bismuth subsalicylate (PEPTO BISMOL) 262 MG chewable tablet Chew 262-524 mg by mouth 3 (three) times daily as needed for indigestion.   Yes [provider]  calcium carbonate (TUMS EX) 750 MG chewable tablet Chew 1-2 tablets by mouth 3 (three) times daily as needed for heartburn.   Yes [provider]  calcium-vitamin D (OSCAL WITH D) 500-200 MG-UNIT tablet Take 1 tablet by mouth 2 (two) times daily.   Yes [provider]  ciprofloxacin (CIPRO) 500 MG tablet TAKE 1 TABLET BY MOUTH EVERY EVENING 08/20/17  Yes Pyrtle, Lajuan Lines, MD  Dextromethorphan HBr (TUSSIN COUGH PO) Take 2 capsules by mouth daily as needed (cough).   Yes [provider]  ergocalciferol (VITAMIN D2) 50000 units capsule Take 50,000 Units by mouth once a week.   Yes [provider]  ferrous sulfate 325 (65 FE) MG tablet Take 1 tablet (325 mg total) by mouth 3 (three) times daily with meals. Patient taking differently: Take 325 mg by mouth every evening.  04/30/17  Yes Patrecia Pour, Christean Grief, MD  folic acid (FOLVITE) 1 MG tablet TAKE 1 TABLET BY MOUTH AT BEDTIME 08/20/17  Yes Pyrtle, Lajuan Lines, MD  furosemide (LASIX) 20 MG tablet Take 2 tablets (40 mg total) by mouth 2 (two) times daily. 07/06/17  Yes Pyrtle, Lajuan Lines, MD  lactulose (CEPHULAC) 20 g packet Take 1 packet (20 g total) by mouth 2 (two) times daily. 08/06/17  Yes Pyrtle, Lajuan Lines, MD  lactulose (CEPHULAC) 20 g packet Take 20 g by mouth 2 (two) times daily.   Yes [provider]  pantoprazole (PROTONIX) 40 MG tablet TAKE 1 TABLET (40 MG TOTAL) BY MOUTH 2 (TWO) TIMES DAILY. 07/06/17  Yes Pyrtle, Lajuan Lines, MD  spironolactone (ALDACTONE) 100 MG tablet Take 1.5 tablets (150 mg total) by mouth daily.  07/06/17  Yes Pyrtle, Lajuan Lines, MD  sucralfate (CARAFATE) 1 g tablet Take 1 tablet (1 g total) by mouth 4 (four) times daily. 06/29/17 06/29/18 Yes Robbie Lis, MD  thiamine 100 MG tablet Take 1 tablet (100 mg total) by mouth daily. 06/30/17  Yes Robbie Lis, MD    Current Facility-Administered Medications  Medication Dose Route Frequency Provider Last Rate Last Dose  . bacitracin 500 UNIT/GM ointment           . cefTRIAXone (ROCEPHIN) 2 g in dextrose 5 % 50 mL IVPB  2 g Intravenous Q24H Regalado, Belkys A, MD      . ferrous sulfate tablet 325 mg  325 mg Oral QPM Danford, Suann Larry, MD      . folic acid (FOLVITE) tablet 1 mg  1 mg Oral QHS Danford, Suann Larry, MD      . Derrill Memo ON  08/24/2017] Influenza vac split quadrivalent PF (FLUARIX) injection 0.5 mL  0.5 mL Intramuscular Tomorrow-1000 Danford, Christopher P, MD      . lactulose (CHRONULAC) 10 GM/15ML solution 30 g  30 g Oral TID Edwin Dada, MD   30 g at 08/23/17 0929  . ondansetron (ZOFRAN) injection 4 mg  4 mg Intravenous Q6H PRN Danford, Suann Larry, MD      . pantoprazole (PROTONIX) EC tablet 40 mg  40 mg Oral BID AC Danford, Suann Larry, MD   40 mg at 08/23/17 0843  . sucralfate (CARAFATE) tablet 1 g  1 g Oral QID Edwin Dada, MD   1 g at 08/23/17 0929  . thiamine (VITAMIN B-1) tablet 100 mg  100 mg Oral Daily Edwin Dada, MD   100 mg at 08/23/17 8416    Allergies as of 08/22/2017 - Review Complete 08/22/2017  Allergen Reaction Noted  . Rifaximin Swelling and Other (See Comments) 11/25/2016     Review of Systems:    Constitutional: No weight loss, fever or chills Skin: No rash Cardiovascular: No chest pain Respiratory: No SOB  Gastrointestinal: See HPI and otherwise negative Genitourinary: No dysuria  Neurological: No headache, dizziness or syncope Musculoskeletal: No new muscle or joint pain Hematologic: No bruising Psychiatric: No history of depression or anxiety   Physical  Exam:  Vital signs in last 24 hours: Temp:  [98 F (36.7 C)-98.4 F (36.9 C)] 98 F (36.7 C) (09/13 0733) Pulse Rate:  [84-90] 90 (09/13 0733) Resp:  [18-23] 18 (09/13 0733) BP: (103-124)/(56-69) 115/56 (09/13 0733) SpO2:  [96 %-100 %] 96 % (09/13 0733) Weight:  [176 lb 2.4 oz (79.9 kg)] 176 lb 2.4 oz (79.9 kg) (09/13 0300) Last BM Date: 08/23/17 General:   Jaundiced, ill-appearing Caucasian male appears to be in NAD, Well developed, Well nourished, alert and cooperative Head:  Normocephalic and atraumatic. Eyes:   PEERL, EOMI. No icterus. Conjunctiva pink. Ears:  Normal auditory acuity. Neck:  Supple Throat: Oral cavity and pharynx without inflammation, swelling or lesion. Lungs: Respirations even and unlabored. Lungs clear to auscultation bilaterally.   No wheezes, crackles, or rhonchi.  Heart: Normal S1, S2. No MRG. Regular rate and rhythm. No peripheral edema, cyanosis or pallor.  Abdomen:  Soft, mild distension, mild epigastric ttp No rebound or guarding. Normal bowel sounds. No appreciable masses or hepatomegaly. Rectal:  Not performed.  Msk:  Symmetrical without gross deformities.  Extremities:  B/l peripheral edema, no deformity or joint abnormality.  Neurologic:  Alert and  oriented x2;  grossly normal neurologically. + [place] Skin:   Dry and intact without significant lesions or rashes. Psychiatric:  Demonstrates good judgement and reason without abnormal affect or behaviors.   LAB RESULTS:  Recent Labs  08/22/17 2320  WBC 8.1  HGB 6.8*  HCT 19.6*  PLT 97*   BMET  Recent Labs  08/22/17 2320 08/23/17 0944  NA 133* 132*  K 4.1 4.1  CL 97* 99*  CO2 28 26  GLUCOSE 124* 174*  BUN 15 15  CREATININE 1.35* 1.14  CALCIUM 9.5 8.9   LFT  Recent Labs  08/22/17 2320  PROT 6.2*  ALBUMIN 2.9*  AST 63*  ALT 40  ALKPHOS 111  BILITOT 8.4*   PT/INR  Recent Labs  08/22/17 2320  LABPROT 28.0*  INR 2.64    PREVIOUS ENDOSCOPIES:            See HPI    Impression / Plan:   Impression: 1.  Alcoholic cirrhosis complicated by hepatic encephalopathy and portal hypertension: Patient typically on lactulose twice a day at home meds wife is away and likely he was not taking this, ammonia has already decreased and patient is mostly coherent at this time 2. Melena: Somewhat chronic for the patient due to his portal gastropathy, he is on iron supplement home, no acute GI bleed at time of admission, though hemoglobin is down at 6.8, we will need to continue to monitor  Plan: 1. Continue lactulose 30 g 3 times a day 2. Continue iron supplement 3. Continue ceftriaxone for SBP prophylaxis as taken at home 4. Continue pantoprazole 40 mg twice a day 5. Patient will likely need to be continued on his home dose of Lasix 40 mg twice a day and Aldactone 150 mg daily 6. Generally patient needs a paracentesis at that time, his abdomen is still soft and he has not been comfortable, this changes during admission could proceed within one then 7. Agree with one unit PRBCs, no overt melena at time of admission, we will hold on endoscopic procedures 8. Continue monitoring hemoglobin and transfusion as a less than 7 9. Please await any further recommendations from Dr. Loletha Carrow  Thank you for your kind consultation, we will continue to follow.  Lavone Nian [facility]  08/23/2017, 10:56 AM Pager #: [phone]   I have reviewed the entire case in detail with the above APP and discussed the plan in detail.  Therefore, I agree with the diagnoses recorded above. In addition,  I have personally interviewed and examined the patient and have personally reviewed any abdominal/pelvic CT scan images.  My additional thoughts are as follows:  I saw Thomas [name] with his wife present, and it sounds as if he is significantly improved today from the standpoint of his encephalopathy. He is mentating better, although his mentation is still somewhat sluggish in his speech is slow. He has a  delayed and somewhat tremulous finger to nose test. While he has large volume ascites, his abdomen is soft and not tense, and I think he can wait until his scheduled paracentesis next week.  We will continue current lactulose dose, we will not add rifaximin because he is reportedly intolerant to it.  He is being transfused 1 unit of PRBCs, and he does not appear to be having brisk GI bleeding.  If he continues on his current trajectory, I believe he will likely be ready for discharge tomorrow.   Nelida Meuse III Pager [phone]  Mon-Fri 8a-5p [phone] after 5p, weekends, holidays

## 2017-08-24 ENCOUNTER — Inpatient Hospital Stay (HOSPITAL_COMMUNITY): Payer: BLUE CROSS/BLUE SHIELD

## 2017-08-24 LAB — BPAM RBC
BLOOD PRODUCT EXPIRATION DATE: 201809302359
Blood Product Expiration Date: 201810012359
ISSUE DATE / TIME: 201809130353
ISSUE DATE / TIME: 201809131638
UNIT TYPE AND RH: 6200
Unit Type and Rh: 6200

## 2017-08-24 LAB — COMPREHENSIVE METABOLIC PANEL
ALBUMIN: 2.2 g/dL — AB (ref 3.5–5.0)
ALK PHOS: 87 U/L (ref 38–126)
ALT: 34 U/L (ref 17–63)
AST: 54 U/L — AB (ref 15–41)
Anion gap: 8 (ref 5–15)
BILIRUBIN TOTAL: 10.1 mg/dL — AB (ref 0.3–1.2)
BUN: 13 mg/dL (ref 6–20)
CALCIUM: 9 mg/dL (ref 8.9–10.3)
CO2: 27 mmol/L (ref 22–32)
CREATININE: 1.01 mg/dL (ref 0.61–1.24)
Chloride: 98 mmol/L — ABNORMAL LOW (ref 101–111)
GFR calc Af Amer: >60 mL/min (ref >60)
GFR calc non Af Amer: >60 mL/min (ref >60)
GLUCOSE: 113 mg/dL — AB (ref 65–99)
POTASSIUM: 3.8 mmol/L (ref 3.5–5.1)
Sodium: 133 mmol/L — ABNORMAL LOW (ref 135–145)
TOTAL PROTEIN: 5.4 g/dL — AB (ref 6.5–8.1)

## 2017-08-24 LAB — BODY FLUID CELL COUNT WITH DIFFERENTIAL
Lymphs, Fluid: 66 %
MONOCYTE-MACROPHAGE-SEROUS FLUID: 33 % — AB (ref 50–90)
Neutrophil Count, Fluid: 1 % (ref 0–25)
WBC FLUID: 119 uL (ref 0–1000)

## 2017-08-24 LAB — TYPE AND SCREEN
ABO/RH(D): A POS
ANTIBODY SCREEN: NEGATIVE
UNIT DIVISION: 0
Unit division: 0

## 2017-08-24 LAB — CBC
HCT: 23.7 % — ABNORMAL LOW (ref 39.0–52.0)
HEMOGLOBIN: 8.5 g/dL — AB (ref 13.0–17.0)
MCH: 36 pg — AB (ref 26.0–34.0)
MCHC: 35.9 g/dL (ref 30.0–36.0)
MCV: 100.4 fL — ABNORMAL HIGH (ref 78.0–100.0)
Platelets: 78 10*3/uL — ABNORMAL LOW (ref 150–400)
RBC: 2.36 MIL/uL — AB (ref 4.22–5.81)
WBC: 7.3 10*3/uL (ref 4.0–10.5)

## 2017-08-24 LAB — MAGNESIUM: Magnesium: 1.6 mg/dL — ABNORMAL LOW (ref 1.7–2.4)

## 2017-08-24 LAB — PROTIME-INR
INR: 2.72
Prothrombin Time: 28.6 seconds — ABNORMAL HIGH (ref 11.4–15.2)

## 2017-08-24 LAB — AMMONIA: Ammonia: 49 umol/L — ABNORMAL HIGH (ref 9–35)

## 2017-08-24 LAB — GRAM STAIN

## 2017-08-24 MED ORDER — SPIRONOLACTONE 50 MG PO TABS
150.0000 mg | ORAL_TABLET | Freq: Every day | ORAL | Status: DC
  Filled 2017-08-24: qty 3

## 2017-08-24 MED ORDER — ALBUMIN HUMAN 25 % IV SOLN
25.0000 g | Freq: Once | INTRAVENOUS | Status: AC
  Administered 2017-08-24: 25 g via INTRAVENOUS
  Filled 2017-08-24: qty 100

## 2017-08-24 MED ORDER — FUROSEMIDE 40 MG PO TABS
40.0000 mg | ORAL_TABLET | Freq: Two times a day (BID) | ORAL | Status: DC
  Administered 2017-08-24: 40 mg via ORAL
  Filled 2017-08-24 (×2): qty 1

## 2017-08-24 MED ORDER — TRAMADOL HCL 50 MG PO TABS
25.0000 mg | ORAL_TABLET | Freq: Three times a day (TID) | ORAL | Status: DC | PRN
  Administered 2017-08-24: 25 mg via ORAL
  Filled 2017-08-24: qty 1

## 2017-08-24 MED ORDER — MAGNESIUM SULFATE 2 GM/50ML IV SOLN
2.0000 g | Freq: Once | INTRAVENOUS | Status: AC
  Administered 2017-08-24: 2 g via INTRAVENOUS
  Filled 2017-08-24: qty 50

## 2017-08-24 MED ORDER — PHYTONADIONE 5 MG PO TABS
10.0000 mg | ORAL_TABLET | Freq: Once | ORAL | Status: AC
  Administered 2017-08-24: 10 mg via ORAL
  Filled 2017-08-24: qty 2

## 2017-08-24 MED ORDER — LIDOCAINE HCL 2 % IJ SOLN
INTRAMUSCULAR | Status: AC
  Filled 2017-08-24: qty 10

## 2017-08-24 NOTE — Progress Notes (Addendum)
PROGRESS NOTE    Thomas [name]  SFK:[id number] DOB: 10/26/1961 DOA: 08/22/2017 PCP: Alroy Dust, L.Marlou Sa, MD    Brief Narrative: Thomas [name] is a 56 y.o. 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  He endorses adherence to his lactulose, regular BMs, no vomiting, no conspitation. No abdominal pain or fever.  No hematochezia, melena, hematemesis.  ED course: -Afebrile, heart rate 89, respirations 23, BP 121/69 and pulse ox normal -Na 133, K 4.1, Cr 1.35 (baseline 1.2), WBC 8.1K, Hgb 6.8 (most recently 8.2) -lipase normal -Bili 8 (baseline 6s), INR 2.6 (baseline) -Ammonia 106 -Mag 1.4 -UA unremarkable -FOBT negative -He was given lactulose and had a large BM -TRH were asked to evaluate for hepatic encephaloapthy and anemia     Assessment & Plan:   Principal Problem:   Hepatic encephalopathy (HCC) Active Problems:   Thrombocytopenia (HCC)   Iron deficiency anemia due to chronic blood loss   End stage liver disease (HCC)   Alcoholic cirrhosis of liver with ascites (HCC)   Hypomagnesemia  1-Acute Hepatic Encephalopathy;  Ammonia level elevated on admission, trending down today to 75. --45 Continue with lactulose.  He is alert and oriented.   ESLD;  Continue with lactulose.  IV ceftriaxone to cover for SBP, in event of encephalopathy.  GI consulted.  Resume diuretics.  Plan for paracentesis tomorrow.  INR ; 2.7. Will give one time dose of vitamin K.   Anemia;  Received 2 untit PRBC.  Hb  increase to 7.5. Increase to 8.5  Hypomagnesemia; replete IV   Thrombocytopenia; monitor.   PNA; Cough; chest x ray with right lower lobe consolidation and pleural effusion.  Started on ceftriaxone to cover for PNA>  Plan for thoracentesis.   DVT prophylaxis: SCD Code Status: Full Code.  Family Communication: wife at bedside.  Disposition Plan: to be determine.   Consultants:  GI   Procedures: none  Antimicrobials: ceftriaxone.   Subjective: He is alert and oriented. Almost back to baseline.   Objective: Vitals:   08/23/17 1635 08/23/17 1705 08/23/17 2005 08/24/17 0535  BP: 115/64 (!) 112/57 116/68 113/66  Pulse: 87 90 88 81  Resp: 14 16 18 14   Temp: 98.8 F (37.1 C) 98.8 F (37.1 C) 99.3 F (37.4 C) 98.3 F (36.8 C)  TempSrc: Oral Oral Oral Oral  SpO2: 96% 96% 97% 99%  Weight:      Height:        Intake/Output Summary (Last 24 hours) at 08/24/17 1438 Last data filed at 08/24/17 1144  Gross per 24 hour  Intake              988 ml  Output              200 ml  Net              788 ml   Filed Weights   08/23/17 0300  Weight: 79.9 kg (176 lb 2.4 oz)    Examination:  General exam: Icteric,  NAD Respiratory system: Decrease breath sounds.  Cardiovascular system: S 1, S 2 RRR Gastrointestinal system: abdomen is soft, nt Central nervous system: non focal.  Extremities: Symmetric 5 x 5 power. Skin: No rashes, lesions or ulcers     Data Reviewed: I have personally reviewed following labs and imaging studies  CBC:  Recent Labs Lab 08/22/17 2320 08/23/17 0944 08/24/17 0532  WBC 8.1 6.3 7.3  NEUTROABS 5.3  --   --   HGB 6.8* 7.5* 8.5*  HCT 19.6* 21.4* 23.7*  MCV 109.5* 102.4* 100.4*  PLT 97* 77* 78*   Basic Metabolic Panel:  Recent Labs Lab 08/22/17 2320 08/23/17 0011 08/23/17 0944 08/24/17 0532 08/24/17 0814  NA 133*  --  132* 133*  --   K 4.1  --  4.1 3.8  --   CL 97*  --  99* 98*  --   CO2 28  --  26 27  --   GLUCOSE 124*  --  174*  113*  --   BUN 15  --  15 13  --   CREATININE 1.35*  --  1.14 1.01  --   CALCIUM 9.5  --  8.9 9.0  --   MG  --  1.4* 1.5*  --  1.6*   GFR: Estimated Creatinine Clearance: 80.5 mL/min (by C-G formula based on SCr of 1.01 mg/dL). Liver Function Tests:  Recent Labs Lab 08/22/17 2320 08/24/17 0532  AST 63* 54*  ALT 40 34  ALKPHOS 111 87  BILITOT 8.4* 10.1*  PROT 6.2* 5.4*  ALBUMIN 2.9* 2.2*    Recent Labs Lab 08/22/17 2320  LIPASE 23    Recent Labs Lab 08/22/17 2321 08/23/17 0944 08/24/17 0532  AMMONIA 107* 75* 49*   Coagulation Profile:  Recent Labs Lab 08/22/17 2320 08/24/17 0814  INR 2.64 2.72   Cardiac Enzymes: No results for input(s): CKTOTAL, CKMB, CKMBINDEX, TROPONINI in the last 168 hours. BNP (last 3 results) No results for input(s): PROBNP in the last 8760 hours. HbA1C: No results for input(s): HGBA1C in the last 72 hours. CBG: No results for input(s): GLUCAP in the last 168 hours. Lipid Profile: No results for input(s): CHOL, HDL, LDLCALC, TRIG, CHOLHDL, LDLDIRECT in the last 72 hours. Thyroid Function Tests: No results for input(s): TSH, T4TOTAL, FREET4, T3FREE, THYROIDAB in the last 72 hours. Anemia Panel: No results for input(s): VITAMINB12, FOLATE, FERRITIN, TIBC, IRON, RETICCTPCT in the last 72 hours. Sepsis Labs: No results for input(s): PROCALCITON, LATICACIDVEN in the last 168 hours.  No results found for this or any previous visit (from the past 240 hour(s)).       Radiology Studies: Dg Chest 2 View  Result Date: 08/23/2017 CLINICAL DATA:  Cirrhosis.  Confusion. EXAM: CHEST  2 VIEW COMPARISON:  06/28/2017.  06/14/2017.  03/27/2017. FINDINGS: Mediastinum and hilar structures normal. Cardiomegaly with normal pulmonary vascularity . Atelectasis right lower lobe. Prominent right pleural effusion again note. Left lung clear. No acute bony abnormality. IMPRESSION: 1. Right lower lobe atelectasis and consolidation. Prominent right pleural  effusion again noted. 2. Cardiomegaly. Electronically Signed   By: Marcello Moores  Register   On: 08/23/2017 12:48        Scheduled Meds: . ferrous sulfate  325 mg Oral QPM  . folic acid  1 mg Oral QHS  . furosemide  40 mg Oral BID  . lactulose  30 g Oral TID  . pantoprazole  40 mg Oral BID AC  . [START ON 08/25/2017] spironolactone  150 mg Oral  Daily  . sucralfate  1 g Oral QID  . thiamine  100 mg Oral Daily   Continuous Infusions: . cefTRIAXone (ROCEPHIN)  IV Stopped (08/24/17 1144)     LOS: 1 day    Time spent: 35 minutes    Elmarie Shiley, MD [facility] Pager [phone]  If 7PM-7AM, please contact night-coverage [email or web address] Password [facility] 08/24/2017, 2:38 PM

## 2017-08-24 NOTE — Procedures (Signed)
Ultrasound-guided diagnostic and therapeutic right thoracentesis performed yielding 1.7 liters of hazy,amber fluid. No immediate complications. Follow-up chest x-ray pending. A portion of the fluid was sent to the lab for preordered studies. Only the above amount of fluid was removed today secondary to pt coughing/chest discomfort.

## 2017-08-24 NOTE — Progress Notes (Signed)
Progress Note   Subjective  Chief Complaint: hepatic encephalopathy  Patient mentating well this morning, very hungry per his wife. Did well overnight with "multiple trips to the bathroom". No new complaints today.    Objective   Vital signs in last 24 hours: Temp:  [97 F (36.1 C)-99.3 F (37.4 C)] 98.3 F (36.8 C) (09/14 0535) Pulse Rate:  [81-90] 81 (09/14 0535) Resp:  [14-18] 14 (09/14 0535) BP: (112-121)/(54-68) 113/66 (09/14 0535) SpO2:  [96 %-99 %] 99 % (09/14 0535) Last BM Date: 08/24/17 (per patient.) General:    Caucasian male in NAD Heart:  Regular rate and rhythm; no murmurs Lungs: Respirations even and unlabored, lungs CTA bilaterally Abdomen:  Large volume ascites, nontender and mild distension Normal bowel sounds. Extremities:  Without edema. Neurologic:  Alert and oriented,  grossly normal neurologically.no asterixis Psych:  Cooperative. Normal mood and affect.  Intake/Output from previous day: 09/13 0701 - 09/14 0700 In: 1340 [P.O.:240; I.V.:250; Blood:750; IV Piggyback:100] Out: 200 [Urine:200]  Lab Results:  Recent Labs  08/22/17 2320 08/23/17 0944 08/24/17 0532  WBC 8.1 6.3 7.3  HGB 6.8* 7.5* 8.5*  HCT 19.6* 21.4* 23.7*  PLT 97* 77* 78*   BMET  Recent Labs  08/22/17 2320 08/23/17 0944 08/24/17 0532  NA 133* 132* 133*  K 4.1 4.1 3.8  CL 97* 99* 98*  CO2 28 26 27   GLUCOSE 124* 174* 113*  BUN 15 15 13   CREATININE 1.35* 1.14 1.01  CALCIUM 9.5 8.9 9.0   LFT  Recent Labs  08/24/17 0532  PROT 5.4*  ALBUMIN 2.2*  AST 54*  ALT 34  ALKPHOS 87  BILITOT 10.1*   PT/INR  Recent Labs  08/22/17 2320  LABPROT 28.0*  INR 2.64    Studies/Results: Dg Chest 2 View  Result Date: 08/23/2017 CLINICAL DATA:  Cirrhosis.  Confusion. EXAM: CHEST  2 VIEW COMPARISON:  06/28/2017.  06/14/2017.  03/27/2017. FINDINGS: Mediastinum and hilar structures normal. Cardiomegaly with normal pulmonary vascularity . Atelectasis right lower lobe.  Prominent right pleural effusion again note. Left lung clear. No acute bony abnormality. IMPRESSION: 1. Right lower lobe atelectasis and consolidation. Prominent right pleural effusion again noted. 2. Cardiomegaly. Electronically Signed   By: Marcello Moores  Register   On: 08/23/2017 12:48       Assessment / Plan:   Assessment: 1. Alcoholic Cirrhosis complicated by hepatic encephalopathy and portal hypertension: much improved today, ok to be discharged 2. Melena: no further since admission, hgb stable after 1 u prbcs yesterday 3. Large volume portal hypertensive ascites  Plan: 1. Continue home meds as previously prescribed 2. Continue Lactulose TID at discharge 3. [place] with patient discharge home today if done with thoracentesis and paracentesis 4. Ordered US guided paracentesis with fluid for cell count and diff and 25 G albumin replacement 5. Hold diuretics 6. Please await final recs from Dr. Loletha Carrow later this morning  (see below)  Discharge Planning Diet: Regular Anticoagulation and antiplatelets: n/a Discharge Medications: no new Follow up: as previously scheduled with Dr. Hilarie Fredrickson  Procedure Procedures planned and timing of procedure: none Lab work ordered: none Hold the following anticoagulation and/or antiplatelets for procedure? n/a   Thank you for your kind consultation, we will sign off.   LOS: 1 day   Levin Erp  08/24/2017, 8:49 AM  Pager # [phone]  I have discussed the case with the PA, and that is the plan I formulated. I personally interviewed and examined the patient.  I agree that  his mental status is much better today, and he appears back to his baseline. Although he is not in distress from the ascites, I would like him to have a therapeutic paracentesis prior to discharge.  Up to 5 liters can be removed, and 25 grams of 25% albumin will be given with it. Fluid will be sent for cell count to rule out SBP.  It appears that a thoracentesis has been  ordered as well.  Discharge today or tomorrow per medical team depending upon what time of day these procedures can get done and how he does afterwards.  Over half of the 35 minute encounter was spent in counseling and coordination of care.       Nelida Meuse III Pager [phone]  Mon-Fri 8a-5p [phone] after 5p, weekends, holidays

## 2017-08-25 ENCOUNTER — Inpatient Hospital Stay (HOSPITAL_COMMUNITY): Payer: BLUE CROSS/BLUE SHIELD

## 2017-08-25 DIAGNOSIS — K921 Melena: Secondary | ICD-10-CM

## 2017-08-25 LAB — COMPREHENSIVE METABOLIC PANEL
ALBUMIN: 2.3 g/dL — AB (ref 3.5–5.0)
ALT: 31 U/L (ref 17–63)
ANION GAP: 7 (ref 5–15)
AST: 48 U/L — ABNORMAL HIGH (ref 15–41)
Alkaline Phosphatase: 93 U/L (ref 38–126)
BUN: 12 mg/dL (ref 6–20)
CHLORIDE: 97 mmol/L — AB (ref 101–111)
CO2: 27 mmol/L (ref 22–32)
Calcium: 8.5 mg/dL — ABNORMAL LOW (ref 8.9–10.3)
Creatinine, Ser: 0.97 mg/dL (ref 0.61–1.24)
GFR calc non Af Amer: >60 mL/min (ref >60)
GLUCOSE: 108 mg/dL — AB (ref 65–99)
POTASSIUM: 3.9 mmol/L (ref 3.5–5.1)
SODIUM: 131 mmol/L — AB (ref 135–145)
Total Bilirubin: 7.8 mg/dL — ABNORMAL HIGH (ref 0.3–1.2)
Total Protein: 5.2 g/dL — ABNORMAL LOW (ref 6.5–8.1)

## 2017-08-25 LAB — BODY FLUID CELL COUNT WITH DIFFERENTIAL
Eos, Fluid: 0 %
LYMPHS FL: 34 %
MONOCYTE-MACROPHAGE-SEROUS FLUID: 66 % (ref 50–90)
Neutrophil Count, Fluid: 2 % (ref 0–25)
Total Nucleated Cell Count, Fluid: 27 cu mm (ref 0–1000)

## 2017-08-25 LAB — PROTIME-INR
INR: 3.1
Prothrombin Time: 31.7 seconds — ABNORMAL HIGH (ref 11.4–15.2)

## 2017-08-25 LAB — MAGNESIUM: MAGNESIUM: 1.7 mg/dL (ref 1.7–2.4)

## 2017-08-25 LAB — HEMOGLOBIN AND HEMATOCRIT, BLOOD
HEMATOCRIT: 23.8 % — AB (ref 39.0–52.0)
Hemoglobin: 8.2 g/dL — ABNORMAL LOW (ref 13.0–17.0)

## 2017-08-25 MED ORDER — MAGNESIUM SULFATE 2 GM/50ML IV SOLN
2.0000 g | Freq: Once | INTRAVENOUS | Status: AC
  Administered 2017-08-25: 2 g via INTRAVENOUS
  Filled 2017-08-25: qty 50

## 2017-08-25 MED ORDER — LACTULOSE 20 G PO PACK: 20.0000 g | PACK | Freq: Three times a day (TID) | ORAL | 3 refills | Status: DC

## 2017-08-25 MED ORDER — MAGNESIUM OXIDE 400 (241.3 MG) MG PO TABS
200.0000 mg | ORAL_TABLET | Freq: Two times a day (BID) | ORAL | Status: DC
  Administered 2017-08-25: 200 mg via ORAL
  Filled 2017-08-25: qty 1

## 2017-08-25 MED ORDER — ALBUMIN HUMAN 25 % IV SOLN
25.0000 g | Freq: Once | INTRAVENOUS | Status: AC
Start: 2017-08-25 — End: 2017-08-25
  Administered 2017-08-25: 25 g via INTRAVENOUS
  Filled 2017-08-25: qty 100

## 2017-08-25 MED ORDER — PHYTONADIONE 5 MG PO TABS
10.0000 mg | ORAL_TABLET | Freq: Once | ORAL | Status: AC
  Administered 2017-08-25: 10 mg via ORAL
  Filled 2017-08-25: qty 2

## 2017-08-25 MED ORDER — SPIRONOLACTONE 50 MG PO TABS: 150.0000 mg | ORAL_TABLET | Freq: Every day | ORAL | Status: DC

## 2017-08-25 MED ORDER — FERROUS SULFATE 325 (65 FE) MG PO TABS
325.0000 mg | ORAL_TABLET | Freq: Two times a day (BID) | ORAL | Status: DC
  Administered 2017-08-25: 325 mg via ORAL
  Filled 2017-08-25: qty 1

## 2017-08-25 MED ORDER — MAGNESIUM OXIDE 400 (241.3 MG) MG PO TABS: 200.0000 mg | ORAL_TABLET | Freq: Two times a day (BID) | ORAL | 0 refills | Status: DC

## 2017-08-25 MED ORDER — FUROSEMIDE 40 MG PO TABS: 40.0000 mg | ORAL_TABLET | Freq: Two times a day (BID) | ORAL | Status: DC

## 2017-08-25 NOTE — Discharge Summary (Signed)
Physician Discharge Summary  Thomas [name] KZS:[id number] DOB: 05-17-1961 DOA: 08/22/2017  PCP: Alroy Dust, L.Marlou Sa, MD  Admit date: 08/22/2017 Discharge date: 08/25/2017  Admitted From: Home  Disposition:  Home   Recommendations for Outpatient Follow-up:  1. Follow up with PCP in 1-2 weeks 2. Please obtain BMP/CBC in one week 3. Follow up with primary gastroenterologist    Discharge Condition: stable.  CODE STATUS: full code.  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 He endorses adherence to his lactulose, regular BMs, no vomiting, no conspitation. No abdominal pain or fever. No hematochezia, melena, hematemesis.  ED course: -Afebrile, heart rate 89, respirations 23, BP 121/69 and pulse ox normal -Na 133, K 4.1, Cr 1.35(baseline 1.2), WBC 8.1K, Hgb 6.8 (most recently 8.2) -lipase normal -Bili 8 (baseline 6s), INR 2.6 (baseline) -Ammonia 106 -Mag 1.4 -UA unremarkable -FOBT negative -He was given lactulose and had a large BM -TRH were asked to evaluate for hepatic encephaloapthy and anemia     Assessment & Plan:   Principal Problem:   Hepatic encephalopathy (HCC) Active Problems:   Thrombocytopenia (HCC)   Iron deficiency anemia due to chronic blood loss   End stage liver disease (HCC)   Alcoholic cirrhosis of liver with ascites (HCC)   Hypomagnesemia  1-Acute Hepatic Encephalopathy;  Ammonia  level elevated on admission, trending down today to 75. --45 Continue with lactulose.  He is alert and oriented.  Improved.   ESLD;  Continue with lactulose.  IV ceftriaxone to cover for SBP, in event of encephalopathy. Resume cipro at discharge  GI consulted.  Resume diuretics.  Underwent paracentesis 9-15 yielding 2.8 L. No significant WBC.  INR 2.7---3 received vitamin k. Needs to be follow up closely with gastroenterologist   Anemia;  Received 2 untit PRBC.  Hb increase to 7.5. Increase to 8.5--stable at 8.2.   Hypomagnesemia; replete IV and oral. Will provide oral supplementation at discharge   Thrombocytopenia; monitor.   PNA; Cough; chest x ray with right lower lobe consolidation and pleural effusion.  Started on ceftriaxone to cover for PNA>  Underwent thoracentesis 9-14, gram stain negative, culture negative. 1.7 L removed.   Discharge Diagnoses:  Principal Problem:   Hepatic encephalopathy ([place]) Active Problems:   Thrombocytopenia (HCC)   Iron deficiency anemia due to chronic blood loss   End stage liver disease (HCC)   Alcoholic cirrhosis of liver with ascites (HCC)   Hypomagnesemia    Discharge Instructions  Discharge Instructions    Diet - low sodium heart healthy    Complete by:  As directed    Increase activity slowly    Complete by:  As directed      Allergies as of 08/25/2017      Reactions   Rifaximin Swelling, Other (See Comments)   Reaction:  All over body swelling       Medication List    TAKE these medications   bismuth subsalicylate 732 MG  chewable tablet Commonly known as:  PEPTO BISMOL Chew 262-524 mg by mouth 3 (three) times daily as needed for indigestion.   calcium carbonate 750 MG chewable tablet Commonly known as:  TUMS EX Chew 1-2 tablets by mouth 3 (three) times daily as needed for heartburn.   calcium-vitamin D 500-200 MG-UNIT tablet Commonly known as:  OSCAL WITH D Take 1 tablet by mouth 2 (two) times daily.    ciprofloxacin 500 MG tablet Commonly known as:  CIPRO TAKE 1 TABLET BY MOUTH EVERY EVENING   ergocalciferol 50000 units capsule Commonly known as:  VITAMIN D2 Take 50,000 Units by mouth once a week.   ferrous sulfate 325 (65 FE) MG tablet Take 1 tablet (325 mg total) by mouth 3 (three) times daily with meals. What changed:  when to take this   folic acid 1 MG tablet Commonly known as:  FOLVITE TAKE 1 TABLET BY MOUTH AT BEDTIME   furosemide 20 MG tablet Commonly known as:  LASIX Take 2 tablets (40 mg total) by mouth 2 (two) times daily.   lactulose 20 g packet Commonly known as:  CEPHULAC Take 1 packet (20 g total) by mouth 3 (three) times daily. What changed:  when to take this  Another medication with the same name was removed. Continue taking this medication, and follow the directions you see here.   magnesium oxide 400 (241.3 Mg) MG tablet Commonly known as:  MAG-OX Take 0.5 tablets (200 mg total) by mouth 2 (two) times daily.   pantoprazole 40 MG tablet Commonly known as:  PROTONIX TAKE 1 TABLET (40 MG TOTAL) BY MOUTH 2 (TWO) TIMES DAILY.   spironolactone 100 MG tablet Commonly known as:  ALDACTONE Take 1.5 tablets (150 mg total) by mouth daily.   sucralfate 1 g tablet Commonly known as:  CARAFATE TAKE 1 TABLET BY MOUTH 4 TIMES DAILY What changed:  See the new instructions.   thiamine 100 MG tablet Take 1 tablet (100 mg total) by mouth daily.   TUSSIN COUGH PO Take 2 capsules by mouth daily as needed (cough).            Discharge Care Instructions        Start     Ordered   08/25/17 0000  lactulose (CEPHULAC) 20 g packet  3 times daily     08/25/17 1236   08/25/17 0000  magnesium oxide (MAG-OX) 400 (241.3 Mg) MG tablet  2 times daily     08/25/17 1236   08/25/17 0000  Increase activity slowly     08/25/17 1236   08/25/17 0000  Diet - low sodium heart healthy     08/25/17 1236      Allergies  Allergen Reactions  . Rifaximin Swelling and  Other (See Comments)    Reaction:  All over body swelling     Consultations:  GI    Procedures/Studies: Dg Chest 1 View  Result Date: 08/24/2017 CLINICAL DATA:  Status post right thoracentesis. EXAM: CHEST 1 VIEW COMPARISON:  08/23/2017 FINDINGS: The heart size and mediastinal contours are within normal limits. Significantly less right pleural fluid after thoracentesis. No pneumothorax. No edema. The heart size and mediastinal contours are within normal limits. The visualized skeletal structures are unremarkable. IMPRESSION: Decrease in right pleural fluid after thoracentesis. No pneumothorax. Electronically Signed   By: Aletta Edouard M.D.   On: 08/24/2017 17:43   Dg Chest 2 View  Result Date: 08/23/2017 CLINICAL DATA:  Cirrhosis.  Confusion. EXAM: CHEST  2 VIEW  COMPARISON:  06/28/2017.  06/14/2017.  03/27/2017. FINDINGS: Mediastinum and hilar structures normal. Cardiomegaly with normal pulmonary vascularity . Atelectasis right lower lobe. Prominent right pleural effusion again note. Left lung clear. No acute bony abnormality. IMPRESSION: 1. Right lower lobe atelectasis and consolidation. Prominent right pleural effusion again noted. 2. Cardiomegaly. Electronically Signed   By: Marcello Moores  Register   On: 08/23/2017 12:48   Ir Paracentesis  Result Date: 08/16/2017 INDICATION: Alcoholic cirrhosis, ascites. Request for diagnostic and therapeutic right paracentesis. EXAM: ULTRASOUND GUIDED RIGHT LATERAL ABDOMEN PARACENTESIS MEDICATIONS: 1% Lidocaine = 10 mL COMPLICATIONS: None immediate. PROCEDURE: Informed written consent was obtained from the patient after a discussion of the risks, benefits and alternatives to treatment. A timeout was performed prior to the initiation of the procedure. Initial ultrasound scanning demonstrates a small amount of ascites within the right lateral abdomen. The right lateral abdomen was prepped and draped in the usual sterile fashion. 1% lidocaine with epinephrine was used  for local anesthesia. Following this, a 19 gauge, 7-cm, Yueh catheter was introduced. An ultrasound image was saved for documentation purposes. The paracentesis was performed. The catheter was removed and a dressing was applied. The patient tolerated the procedure well without immediate post procedural complication. FINDINGS: A total of approximately 2.6 liters of clear yellow fluid was removed. Samples were sent to the laboratory as requested by the clinical team. IMPRESSION: Successful ultrasound-guided paracentesis yielding 2.6 liters of peritoneal fluid. Read by:  Gareth Eagle, PA-C Electronically Signed   By: Jacqulynn Cadet M.D.   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 EXAM: ULTRASOUND GUIDED RIGHT THORACENTESIS MEDICATIONS: None. COMPLICATIONS: None immediate. PROCEDURE: An ultrasound guided thoracentesis was thoroughly discussed with the patient and questions answered. The benefits, risks, alternatives and complications were also discussed. The patient understands and wishes to proceed with the procedure. Written consent was obtained. Ultrasound was performed to localize and [place] an adequate pocket of fluid in the right chest. The area was then prepped and draped in the normal sterile fashion. 1% Lidocaine was used for local anesthesia. Under ultrasound guidance a 19 gauge, 7-cm, Yueh catheter was introduced. Thoracentesis was performed. The catheter was removed and a dressing applied. FINDINGS: A total of approximately 800 mL of hazy amber colored fluid was removed. Samples were sent to the laboratory as requested by the clinical team. IMPRESSION: Successful ultrasound guided right thoracentesis yielding 800 mL of pleural fluid. Electronically Signed   By: Jacqulynn Cadet M.D.   On: 08/24/2017 17:40      Subjective: He is feeling better, had pain after thoracentesis.     Discharge Exam: Vitals:   08/24/17 2140 08/25/17 0458  BP: 123/69 114/60  Pulse: 83 92  Resp: 18 18  Temp: 98.3 F (36.8 C) 99.4 F (37.4 C)  SpO2: 99% 96%   Vitals:   08/24/17 1603 08/24/17 1628 08/24/17 2140 08/25/17 0458  BP: 108/65 111/64 123/69 114/60  Pulse:  84 83 92  Resp:  18 18 18   Temp:  98.3 F (36.8 C) 98.3 F (36.8 C) 99.4 F (37.4 C)  TempSrc:  Oral Oral Oral  SpO2:   99% 96%  Weight:      Height:        General: Pt is alert, awake, not in acute distress Cardiovascular: RRR, S1/S2 +, no rubs, no gallops Respiratory: CTA bilaterally, no wheezing, no rhonchi Abdominal: Soft, NT, ND, bowel sounds + Extremities:  no edema, no cyanosis    The results of significant diagnostics from this hospitalization (including imaging, microbiology, ancillary and laboratory) are listed below for reference.     Microbiology: Recent Results (from the past 240 hour(s))  Culture, body fluid-bottle     Status: None (Preliminary result)   Collection Time: 08/24/17  4:20 PM  Result Value Ref Range Status   Specimen Description PLEURAL  Final   Special Requests NONE  Final   Culture   Final    NO GROWTH < 12 HOURS Performed at [facility], [address] [place], [place] [address]    Report Status PENDING  Incomplete  Gram stain     Status: None   Collection Time: 08/24/17  4:20 PM  Result Value Ref Range Status   Specimen Description PLEURAL  Final   Special Requests NONE  Final   Gram Stain   Final    CYTOSPIN SMEAR WBC PRESENT, PREDOMINANTLY MONONUCLEAR NO ORGANISMS SEEN Performed at [facility], [address] [place],  [address]    Report Status 08/24/2017 FINAL  Final     Labs: BNP (last 3 results)  Recent Labs  03/27/17 2051 06/13/17 1856  BNP 106.7* 32.4   Basic Metabolic Panel:  Recent Labs Lab 08/22/17 2320 08/23/17 0011 08/23/17 0944 08/24/17 0532 08/24/17 0814 08/25/17 0545  NA 133*  --  132* 133*  --  131*   K 4.1  --  4.1 3.8  --  3.9  CL 97*  --  99* 98*  --  97*  CO2 28  --  26 27  --  27  GLUCOSE 124*  --  174* 113*  --  108*  BUN 15  --  15 13  --  12  CREATININE 1.35*  --  1.14 1.01  --  0.97  CALCIUM 9.5  --  8.9 9.0  --  8.5*  MG  --  1.4* 1.5*  --  1.6* 1.7   Liver Function Tests:  Recent Labs Lab 08/22/17 2320 08/24/17 0532 08/25/17 0545  AST 63* 54* 48*  ALT 40 34 31  ALKPHOS 111 87 93  BILITOT 8.4* 10.1* 7.8*  PROT 6.2* 5.4* 5.2*  ALBUMIN 2.9* 2.2* 2.3*    Recent Labs Lab 08/22/17 2320  LIPASE 23    Recent Labs Lab 08/22/17 2321 08/23/17 0944 08/24/17 0532  AMMONIA 107* 75* 49*   CBC:  Recent Labs Lab 08/22/17 2320 08/23/17 0944 08/24/17 0532 08/25/17 0545  WBC 8.1 6.3 7.3  --   NEUTROABS 5.3  --   --   --   HGB 6.8* 7.5* 8.5* 8.2*  HCT 19.6* 21.4* 23.7* 23.8*  MCV 109.5* 102.4* 100.4*  --   PLT 97* 77* 78*  --    Cardiac Enzymes: No results for input(s): CKTOTAL, CKMB, CKMBINDEX, TROPONINI in the last 168 hours. BNP: Invalid input(s): POCBNP CBG: No results for input(s): GLUCAP in the last 168 hours. D-Dimer No results for input(s): DDIMER in the last 72 hours. Hgb A1c No results for input(s): HGBA1C in the last 72 hours. Lipid Profile No results for input(s): CHOL, HDL, LDLCALC, TRIG, CHOLHDL, LDLDIRECT in the last 72 hours. Thyroid function studies No results for input(s): TSH, T4TOTAL, T3FREE, THYROIDAB in the last 72 hours.  Invalid input(s): FREET3 Anemia work up No results for input(s): VITAMINB12, FOLATE, FERRITIN, TIBC, IRON, RETICCTPCT in the last 72 hours. Urinalysis    Component Value Date/Time   COLORURINE AMBER (A)  08/23/2017 0011   APPEARANCEUR CLEAR 08/23/2017 0011   LABSPEC 1.016 08/23/2017 0011   PHURINE 6.0 08/23/2017 0011   GLUCOSEU NEGATIVE 08/23/2017 0011   HGBUR NEGATIVE 08/23/2017 0011   BILIRUBINUR NEGATIVE 08/23/2017 0011   KETONESUR NEGATIVE 08/23/2017 0011   PROTEINUR NEGATIVE 08/23/2017 0011    UROBILINOGEN 0.2 11/22/2012 0856   NITRITE NEGATIVE 08/23/2017 0011   LEUKOCYTESUR NEGATIVE 08/23/2017 0011   Sepsis Labs Invalid input(s): PROCALCITONIN,  WBC,  LACTICIDVEN Microbiology Recent Results (from the past 240 hour(s))  Culture, body fluid-bottle     Status: None (Preliminary result)   Collection Time: 08/24/17  4:20 PM  Result Value Ref Range Status   Specimen Description PLEURAL  Final   Special Requests NONE  Final   Culture   Final    NO GROWTH < 12 HOURS Performed at [facility], [employer] [address], [place], [place] [address]    Report Status PENDING  Incomplete  Gram stain     Status: None   Collection Time: 08/24/17  4:20 PM  Result Value Ref Range Status   Specimen Description PLEURAL  Final   Special Requests NONE  Final   Gram Stain   Final    CYTOSPIN SMEAR WBC PRESENT, PREDOMINANTLY MONONUCLEAR NO ORGANISMS SEEN Performed at [facility], [address] [place], [place] [address]    Report Status 08/24/2017 FINAL  Final     Time coordinating discharge: Over 30 minutes  SIGNED:   Elmarie Shiley, MD  [facility] 08/25/2017, 12:36 PM Pager   If 7PM-7AM, please contact night-coverage [email or web address] Password [email or web address]

## 2017-08-25 NOTE — Progress Notes (Signed)
Progress Note   Subjective  Chief Complaint:hepatic encephalopathy  This morning the patient is found sitting up in bed eating his breakfast. He describes that his thoracentesis what well yesterday but did cause him quite a lot of discomfort and coughing. He is aware of plans for paracentesis today and excited that he may be able to go home later.    Objective   Vital signs in last 24 hours: Temp:  [98.3 F (36.8 C)-99.4 F (37.4 C)] 99.4 F (37.4 C) (09/15 0458) Pulse Rate:  [83-92] 92 (09/15 0458) Resp:  [18] 18 (09/15 0458) BP: (92-133)/(57-76) 114/60 (09/15 0458) SpO2:  [96 %-99 %] 96 % (09/15 0458) Last BM Date: 08/24/17 (per patient.) General:   Jaundiced caucasian male in NAD Heart:  Regular rate and rhythm; no murmurs Lungs: Respirations even and unlabored, lungs CTA bilaterally Abdomen:  Large volume ascites, nontender and moderate distension Normal bowel sounds. Extremities: b/l peripheral edema Neurologic:  Alert and oriented,  grossly normal neurologically. Psych:  Cooperative. Normal mood and affect.  Intake/Output from previous day: 09/14 0701 - 09/15 0700 In: 37 [IV Piggyback:50] Out: -   Lab Results:  Recent Labs  08/22/17 2320 08/23/17 0944 08/24/17 0532  WBC 8.1 6.3 7.3  HGB 6.8* 7.5* 8.5*  HCT 19.6* 21.4* 23.7*  PLT 97* 77* 78*   BMET  Recent Labs  08/23/17 0944 08/24/17 0532 08/25/17 0545  NA 132* 133* 131*  K 4.1 3.8 3.9  CL 99* 98* 97*  CO2 26 27 27   GLUCOSE 174* 113* 108*  BUN 15 13 12   CREATININE 1.14 1.01 0.97  CALCIUM 8.9 9.0 8.5*   LFT  Recent Labs  08/25/17 0545  PROT 5.2*  ALBUMIN 2.3*  AST 48*  ALT 31  ALKPHOS 93  BILITOT 7.8*   PT/INR  Recent Labs  08/24/17 0814 08/25/17 0545  LABPROT 28.6* 31.7*  INR 2.72 3.10    Studies/Results: Dg Chest 1 View  Result Date: 08/24/2017 CLINICAL DATA:  Status post right thoracentesis. EXAM: CHEST 1 VIEW COMPARISON:  08/23/2017 FINDINGS: The heart size and  mediastinal contours are within normal limits. Significantly less right pleural fluid after thoracentesis. No pneumothorax. No edema. The heart size and mediastinal contours are within normal limits. The visualized skeletal structures are unremarkable. IMPRESSION: Decrease in right pleural fluid after thoracentesis. No pneumothorax. Electronically Signed   By: Aletta Edouard M.D.   On: 08/24/2017 17:43   Dg Chest 2 View  Result Date: 08/23/2017 CLINICAL DATA:  Cirrhosis.  Confusion. EXAM: CHEST  2 VIEW COMPARISON:  06/28/2017.  06/14/2017.  03/27/2017. FINDINGS: Mediastinum and hilar structures normal. Cardiomegaly with normal pulmonary vascularity . Atelectasis right lower lobe. Prominent right pleural effusion again note. Left lung clear. No acute bony abnormality. IMPRESSION: 1. Right lower lobe atelectasis and consolidation. Prominent right pleural effusion again noted. 2. Cardiomegaly. 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 EXAM: ULTRASOUND GUIDED RIGHT THORACENTESIS MEDICATIONS: None. COMPLICATIONS: None immediate. PROCEDURE: An ultrasound guided thoracentesis was thoroughly discussed with the patient and questions answered. The benefits, risks, alternatives and complications were also discussed. The patient understands and wishes to proceed with the procedure. Written consent was obtained. Ultrasound was performed to localize and [place] an adequate pocket of fluid in the right chest. The area was then prepped and draped in the normal sterile fashion. 1%  Lidocaine was used for local anesthesia. Under ultrasound guidance a 19 gauge, 7-cm, Yueh catheter was introduced. Thoracentesis was performed. The catheter was removed and a dressing applied. FINDINGS: A total of approximately 800 mL of hazy amber colored fluid was removed. Samples  were sent to the laboratory as requested by the clinical team. IMPRESSION: Successful ultrasound guided right thoracentesis yielding 800 mL of pleural fluid. Electronically Signed   By: Jacqulynn Cadet M.D.   On: 08/24/2017 17:40       Assessment / Plan:   Assessment: 1. Alcoholic cirrhosis complicated by hepatic encephalopathy and portal hypertension:continues to be much improved today, okay for discharge after paracentesis 2. Melena:no further melena, hemoglobin stable after one unit PRBCs 08/23/17 3. Large volume portal hypertensive ascites: paracentesis scheduled today  Plan: 1. Continue home meds as previously prescribed 2. Continue lactulose 3 times a day at discharge 3. Ultrasound-guided paracentesis with fluid for cell count and differential and 25 g albumin replacement ordered today, after this patient may be discharged 4. Hold diuretics currently, he may resume these tomorrow 5. Dr. Henrene Pastor is on call today and may see the patient later if he is still in hospital  Discharge Planning Diet: Regular Anticoagulation and antiplatelets: n/a Discharge Medications: no new Follow up: as previously scheduled with Dr. Hilarie Fredrickson  Procedure Procedures planned and timing of procedure: none Lab work ordered: none Hold the following anticoagulation and/or antiplatelets for procedure? n/a  Thank you for your kind consultation, we will sign off   LOS: 2 days   Levin Erp  08/25/2017, 8:36 AM  Pager # [phone]  GI ATTENDING  Case discussed with Dr. Loletha Carrow. Interval history and data personally reviewed. Agree with interval progress note. Patient's wife in room. Successful paracentesis of 2.8 L today. Cell count with differential pending. Patient to go home today. Discharge planning from GI perspective as outlined above. Will sign off.  Docia Chuck. Geri Seminole., M.D. [facility] Division of Gastroenterology

## 2017-08-25 NOTE — Progress Notes (Signed)
Patient remains stable. Pt is A&Ox4, out of bed 1 assist. Discharge instructions reviewed. Questions,concerns denied.

## 2017-08-25 NOTE — Procedures (Signed)
PROCEDURE SUMMARY:  Successful US guided paracentesis from left lateral abdomen.  Yielded 2.8 liters of clear yellow fluid.  No immediate complications.  Pt tolerated well.   Specimen was sent for labs.  Librada Castronovo S Alekzander Cardell PA-C 08/25/2017 1:23 PM

## 2017-08-27 ENCOUNTER — Telehealth: Payer: Self-pay | Admitting: Internal Medicine

## 2017-08-27 LAB — PATHOLOGIST SMEAR REVIEW

## 2017-08-27 NOTE — Telephone Encounter (Signed)
Letter faxed per pt request.

## 2017-08-29 LAB — CULTURE, BODY FLUID-BOTTLE: CULTURE: NO GROWTH

## 2017-09-03 ENCOUNTER — Other Ambulatory Visit: Payer: Self-pay

## 2017-09-03 ENCOUNTER — Other Ambulatory Visit (INDEPENDENT_AMBULATORY_CARE_PROVIDER_SITE_OTHER): Payer: BLUE CROSS/BLUE SHIELD

## 2017-09-03 ENCOUNTER — Telehealth: Payer: Self-pay | Admitting: Gastroenterology

## 2017-09-03 DIAGNOSIS — R188 Other ascites: Principal | ICD-10-CM

## 2017-09-03 DIAGNOSIS — K7460 Unspecified cirrhosis of liver: Secondary | ICD-10-CM

## 2017-09-03 LAB — COMPREHENSIVE METABOLIC PANEL
ALBUMIN: 2.2 g/dL — AB (ref 3.5–5.2)
ALT: 29 U/L (ref 0–53)
AST: 62 U/L — AB (ref 0–37)
Alkaline Phosphatase: 93 U/L (ref 39–117)
BUN: 34 mg/dL — AB (ref 6–23)
CHLORIDE: 96 meq/L (ref 96–112)
CO2: 25 meq/L (ref 19–32)
CREATININE: 2.03 mg/dL — AB (ref 0.40–1.50)
Calcium: 9 mg/dL (ref 8.4–10.5)
GFR: 36.39 mL/min — ABNORMAL LOW (ref >60.00)
GLUCOSE: 159 mg/dL — AB (ref 70–99)
Potassium: 4.8 mEq/L (ref 3.5–5.1)
SODIUM: 127 meq/L — AB (ref 135–145)
Total Bilirubin: 8.8 mg/dL — ABNORMAL HIGH (ref 0.2–1.2)
Total Protein: 5.2 g/dL — ABNORMAL LOW (ref 6.0–8.3)

## 2017-09-03 LAB — PROTIME-INR
INR: 2.4 ratio — ABNORMAL HIGH (ref 0.8–1.0)
Prothrombin Time: 25.8 s — ABNORMAL HIGH (ref 9.6–13.1)

## 2017-09-03 NOTE — Telephone Encounter (Signed)
Oncall note Patient was admitted to a hospital overnight in [place] after he had coffee ground emesis. Hgb was 5, received 2 uPRBC, according to wife didn't get endoscopy or any other evaluation. Cr was elevated at 2.2. She was advised to contact his doctors here and she called immediately after they reached [facility]. No longer vomiting, feels well, no fever, or confusion. Denies any melena, rectal bleeding or abdominal pain. Eating fine. Other than feeling tired, he is at baseline according to his wife. She is requesting office visit and lab check. Will forward to Dr Hilarie Fredrickson. Advised to come to ER if he has recurrent hematemesis.

## 2017-09-04 ENCOUNTER — Inpatient Hospital Stay (HOSPITAL_COMMUNITY): Payer: BLUE CROSS/BLUE SHIELD

## 2017-09-04 ENCOUNTER — Other Ambulatory Visit: Payer: BLUE CROSS/BLUE SHIELD

## 2017-09-04 ENCOUNTER — Inpatient Hospital Stay (HOSPITAL_COMMUNITY)
Admission: AD | Admit: 2017-09-04 | Discharge: 2017-09-07 | DRG: 432 | Disposition: A | Discharge status: Home / Self Care | Payer: BLUE CROSS/BLUE SHIELD | Source: Ambulatory Visit | Attending: Internal Medicine | Admitting: Internal Medicine

## 2017-09-04 ENCOUNTER — Encounter: Payer: Self-pay | Admitting: Internal Medicine

## 2017-09-04 ENCOUNTER — Ambulatory Visit (INDEPENDENT_AMBULATORY_CARE_PROVIDER_SITE_OTHER): Payer: BLUE CROSS/BLUE SHIELD | Admitting: Internal Medicine

## 2017-09-04 VITALS — BP 120/58 | HR 72

## 2017-09-04 DIAGNOSIS — Z8659 Personal history of other mental and behavioral disorders: Secondary | ICD-10-CM | POA: Diagnosis not present

## 2017-09-04 DIAGNOSIS — K7040 Alcoholic hepatic failure without coma: Secondary | ICD-10-CM | POA: Diagnosis present

## 2017-09-04 DIAGNOSIS — D638 Anemia in other chronic diseases classified elsewhere: Secondary | ICD-10-CM | POA: Diagnosis present

## 2017-09-04 DIAGNOSIS — K7290 Hepatic failure, unspecified without coma: Secondary | ICD-10-CM | POA: Diagnosis not present

## 2017-09-04 DIAGNOSIS — K7682 Hepatic encephalopathy: Secondary | ICD-10-CM | POA: Diagnosis present

## 2017-09-04 DIAGNOSIS — K3189 Other diseases of stomach and duodenum: Secondary | ICD-10-CM | POA: Diagnosis present

## 2017-09-04 DIAGNOSIS — I129 Hypertensive chronic kidney disease with stage 1 through stage 4 chronic kidney disease, or unspecified chronic kidney disease: Secondary | ICD-10-CM | POA: Diagnosis present

## 2017-09-04 DIAGNOSIS — I8510 Secondary esophageal varices without bleeding: Secondary | ICD-10-CM | POA: Diagnosis present

## 2017-09-04 DIAGNOSIS — R944 Abnormal results of kidney function studies: Secondary | ICD-10-CM | POA: Diagnosis present

## 2017-09-04 DIAGNOSIS — D500 Iron deficiency anemia secondary to blood loss (chronic): Secondary | ICD-10-CM

## 2017-09-04 DIAGNOSIS — J918 Pleural effusion in other conditions classified elsewhere: Secondary | ICD-10-CM | POA: Diagnosis not present

## 2017-09-04 DIAGNOSIS — K7030 Alcoholic cirrhosis of liver without ascites: Secondary | ICD-10-CM | POA: Diagnosis not present

## 2017-09-04 DIAGNOSIS — R05 Cough: Secondary | ICD-10-CM

## 2017-09-04 DIAGNOSIS — N179 Acute kidney failure, unspecified: Secondary | ICD-10-CM

## 2017-09-04 DIAGNOSIS — Z792 Long term (current) use of antibiotics: Secondary | ICD-10-CM | POA: Diagnosis not present

## 2017-09-04 DIAGNOSIS — R059 Cough, unspecified: Secondary | ICD-10-CM

## 2017-09-04 DIAGNOSIS — Z79899 Other long term (current) drug therapy: Secondary | ICD-10-CM

## 2017-09-04 DIAGNOSIS — E871 Hypo-osmolality and hyponatremia: Secondary | ICD-10-CM | POA: Diagnosis present

## 2017-09-04 DIAGNOSIS — J90 Pleural effusion, not elsewhere classified: Secondary | ICD-10-CM | POA: Diagnosis not present

## 2017-09-04 DIAGNOSIS — R278 Other lack of coordination: Secondary | ICD-10-CM | POA: Diagnosis present

## 2017-09-04 DIAGNOSIS — K769 Liver disease, unspecified: Secondary | ICD-10-CM | POA: Diagnosis not present

## 2017-09-04 DIAGNOSIS — K8020 Calculus of gallbladder without cholecystitis without obstruction: Secondary | ICD-10-CM | POA: Diagnosis not present

## 2017-09-04 DIAGNOSIS — J9811 Atelectasis: Secondary | ICD-10-CM | POA: Diagnosis present

## 2017-09-04 DIAGNOSIS — Z881 Allergy status to other antibiotic agents status: Secondary | ICD-10-CM

## 2017-09-04 DIAGNOSIS — K7200 Acute and subacute hepatic failure without coma: Secondary | ICD-10-CM | POA: Diagnosis not present

## 2017-09-04 DIAGNOSIS — K766 Portal hypertension: Secondary | ICD-10-CM

## 2017-09-04 DIAGNOSIS — R188 Other ascites: Secondary | ICD-10-CM

## 2017-09-04 DIAGNOSIS — D61818 Other pancytopenia: Secondary | ICD-10-CM | POA: Diagnosis present

## 2017-09-04 DIAGNOSIS — Z9889 Other specified postprocedural states: Secondary | ICD-10-CM

## 2017-09-04 DIAGNOSIS — D696 Thrombocytopenia, unspecified: Secondary | ICD-10-CM | POA: Diagnosis present

## 2017-09-04 DIAGNOSIS — K219 Gastro-esophageal reflux disease without esophagitis: Secondary | ICD-10-CM | POA: Diagnosis present

## 2017-09-04 DIAGNOSIS — J948 Other specified pleural conditions: Secondary | ICD-10-CM | POA: Diagnosis present

## 2017-09-04 DIAGNOSIS — K7031 Alcoholic cirrhosis of liver with ascites: Principal | ICD-10-CM | POA: Diagnosis present

## 2017-09-04 DIAGNOSIS — K652 Spontaneous bacterial peritonitis: Secondary | ICD-10-CM | POA: Diagnosis present

## 2017-09-04 DIAGNOSIS — N183 Chronic kidney disease, stage 3 (moderate): Secondary | ICD-10-CM | POA: Diagnosis present

## 2017-09-04 DIAGNOSIS — R112 Nausea with vomiting, unspecified: Secondary | ICD-10-CM

## 2017-09-04 LAB — COMPREHENSIVE METABOLIC PANEL
ALBUMIN: 2.6 g/dL — AB (ref 3.5–5.0)
ALK PHOS: 116 U/L (ref 38–126)
ALT: 42 U/L (ref 17–63)
ANION GAP: 11 (ref 5–15)
AST: 79 U/L — ABNORMAL HIGH (ref 15–41)
BILIRUBIN TOTAL: 9.3 mg/dL — AB (ref 0.3–1.2)
BUN: 31 mg/dL — ABNORMAL HIGH (ref 6–20)
CALCIUM: 9.9 mg/dL (ref 8.9–10.3)
CO2: 23 mmol/L (ref 22–32)
Chloride: 98 mmol/L — ABNORMAL LOW (ref 101–111)
Creatinine, Ser: 1.84 mg/dL — ABNORMAL HIGH (ref 0.61–1.24)
GFR calc non Af Amer: 40 mL/min — ABNORMAL LOW (ref >60)
GFR, EST AFRICAN AMERICAN: 46 mL/min — AB (ref >60)
GLUCOSE: 112 mg/dL — AB (ref 65–99)
POTASSIUM: 4.8 mmol/L (ref 3.5–5.1)
SODIUM: 132 mmol/L — AB (ref 135–145)
TOTAL PROTEIN: 6.3 g/dL — AB (ref 6.5–8.1)

## 2017-09-04 LAB — CBC WITH DIFFERENTIAL/PLATELET
BASOS ABS: 0.1 10*3/uL (ref 0.0–0.1)
BASOS PCT: 1.1 % (ref 0.0–3.0)
Basophils Absolute: 0.1 10*3/uL (ref 0.0–0.1)
Basophils Relative: 1 %
EOS ABS: 0.1 10*3/uL (ref 0.0–0.7)
EOS PCT: 1.5 % (ref 0.0–5.0)
Eosinophils Absolute: 0.1 10*3/uL (ref 0.0–0.7)
Eosinophils Relative: 2 %
HCT: 23.2 % — CL (ref 39.0–52.0)
HCT: 23.3 % — ABNORMAL LOW (ref 39.0–52.0)
HEMOGLOBIN: 8.1 g/dL — AB (ref 13.0–17.0)
LYMPHS ABS: 0.6 10*3/uL — AB (ref 0.7–4.0)
LYMPHS PCT: 11 %
Lymphocytes Relative: 10.6 % — ABNORMAL LOW (ref 12.0–46.0)
Lymphs Abs: 0.7 10*3/uL (ref 0.7–4.0)
MCH: 33.9 pg (ref 26.0–34.0)
MCHC: 34.8 g/dL (ref 30.0–36.0)
MCHC: 34.8 g/dL (ref 30.0–36.0)
MCV: 103.2 fl — ABNORMAL HIGH (ref 78.0–100.0)
MCV: 97.5 fL (ref 78.0–100.0)
MONO ABS: 0.7 10*3/uL (ref 0.1–1.0)
MONOS PCT: 17 %
Monocytes Absolute: 1.2 10*3/uL — ABNORMAL HIGH (ref 0.1–1.0)
Monocytes Relative: 12.8 % — ABNORMAL HIGH (ref 3.0–12.0)
NEUTROS ABS: 4.2 10*3/uL (ref 1.4–7.7)
NEUTROS PCT: 74 % (ref 43.0–77.0)
NEUTROS PCT: 69 %
Neutro Abs: 4.7 10*3/uL (ref 1.7–7.7)
PLATELETS: 93 10*3/uL — AB (ref 150.0–400.0)
PLATELETS: 86 10*3/uL — AB (ref 150–400)
RBC: 2.25 Mil/uL — ABNORMAL LOW (ref 4.22–5.81)
RBC: 2.39 MIL/uL — AB (ref 4.22–5.81)
RDW: 25.1 % — AB (ref 11.5–15.5)
RDW: 23.4 % — ABNORMAL HIGH (ref 11.5–15.5)
WBC: 5.7 10*3/uL (ref 4.0–10.5)
WBC: 6.8 10*3/uL (ref 4.0–10.5)

## 2017-09-04 LAB — MAGNESIUM: Magnesium: 2.2 mg/dL (ref 1.7–2.4)

## 2017-09-04 LAB — RETICULOCYTES
RBC.: 2.39 MIL/uL — AB (ref 4.22–5.81)
RETIC CT PCT: 6.1 % — AB (ref 0.4–3.1)
Retic Count, Absolute: 145.8 10*3/uL (ref 19.0–186.0)

## 2017-09-04 LAB — PATHOLOGIST SMEAR REVIEW

## 2017-09-04 LAB — PROTIME-INR
INR: 2.34
Prothrombin Time: 25.4 seconds — ABNORMAL HIGH (ref 11.4–15.2)

## 2017-09-04 LAB — AMMONIA: Ammonia: 86 umol/L — ABNORMAL HIGH (ref 9–35)

## 2017-09-04 LAB — PHOSPHORUS: PHOSPHORUS: 4.8 mg/dL — AB (ref 2.5–4.6)

## 2017-09-04 LAB — LACTATE DEHYDROGENASE: LDH: 205 U/L — ABNORMAL HIGH (ref 98–192)

## 2017-09-04 MED ORDER — ACETAMINOPHEN 650 MG RE SUPP: 650.0000 mg | Freq: Four times a day (QID) | RECTAL | Status: DC | PRN

## 2017-09-04 MED ORDER — PANTOPRAZOLE SODIUM 40 MG IV SOLR
40.0000 mg | Freq: Two times a day (BID) | INTRAVENOUS | Status: DC
  Administered 2017-09-04 – 2017-09-07 (×6): 40 mg via INTRAVENOUS
  Filled 2017-09-04 (×6): qty 40

## 2017-09-04 MED ORDER — ACETAMINOPHEN 325 MG PO TABS
650.0000 mg | ORAL_TABLET | Freq: Four times a day (QID) | ORAL | Status: DC | PRN
  Filled 2017-09-04: qty 2

## 2017-09-04 MED ORDER — ALBUMIN HUMAN 25 % IV SOLN
25.0000 g | Freq: Once | INTRAVENOUS | Status: AC
  Administered 2017-09-04: 25 g via INTRAVENOUS
  Filled 2017-09-04: qty 100

## 2017-09-04 MED ORDER — ONDANSETRON HCL 4 MG/2ML IJ SOLN
4.0000 mg | Freq: Four times a day (QID) | INTRAMUSCULAR | Status: DC | PRN
  Administered 2017-09-05: 4 mg via INTRAVENOUS
  Filled 2017-09-04 (×2): qty 2

## 2017-09-04 MED ORDER — LACTULOSE 10 GM/15ML PO SOLN
30.0000 g | Freq: Three times a day (TID) | ORAL | Status: DC
  Administered 2017-09-04 – 2017-09-07 (×9): 30 g via ORAL
  Filled 2017-09-04 (×9): qty 45

## 2017-09-04 MED ORDER — SUCRALFATE 1 GM/10ML PO SUSP
1.0000 g | Freq: Three times a day (TID) | ORAL | Status: DC
  Administered 2017-09-04 – 2017-09-07 (×9): 1 g via ORAL
  Filled 2017-09-04 (×8): qty 10

## 2017-09-04 MED ORDER — ONDANSETRON HCL 4 MG/2ML IJ SOLN: 4.0000 mg | Freq: Four times a day (QID) | INTRAMUSCULAR | Status: DC | PRN

## 2017-09-04 MED ORDER — MENTHOL 3 MG MT LOZG
1.0000 | LOZENGE | OROMUCOSAL | Status: DC | PRN
  Filled 2017-09-04: qty 9

## 2017-09-04 MED ORDER — BENZONATATE 100 MG PO CAPS
100.0000 mg | ORAL_CAPSULE | Freq: Two times a day (BID) | ORAL | Status: DC
  Administered 2017-09-04 – 2017-09-07 (×6): 100 mg via ORAL
  Filled 2017-09-04 (×6): qty 1

## 2017-09-04 MED ORDER — ONDANSETRON HCL 4 MG PO TABS: 4.0000 mg | ORAL_TABLET | Freq: Four times a day (QID) | ORAL | Status: DC | PRN

## 2017-09-04 MED ORDER — SODIUM CHLORIDE 0.9 % IV BOLUS (SEPSIS)
1000.0000 mL | Freq: Once | INTRAVENOUS | Status: AC
  Administered 2017-09-04: 1000 mL via INTRAVENOUS

## 2017-09-04 MED ORDER — DEXTROSE 5 % IV SOLN
2.0000 g | INTRAVENOUS | Status: DC
  Administered 2017-09-04 – 2017-09-06 (×3): 2 g via INTRAVENOUS
  Filled 2017-09-04 (×4): qty 2

## 2017-09-04 MED ORDER — VITAMIN B-1 100 MG PO TABS
100.0000 mg | ORAL_TABLET | Freq: Every day | ORAL | Status: DC
  Administered 2017-09-05 – 2017-09-07 (×3): 100 mg via ORAL
  Filled 2017-09-04 (×3): qty 1

## 2017-09-04 NOTE — H&P (Signed)
[facility] History and Physical   Patient: Thomas [name] MOQ:[id number]   PCP: Alroy Dust, L.Marlou Sa, MD DOB: 14-Aug-1961   DOA: 09/04/2017   DOS: 09/04/2017   DOS: the patient was seen and examined on 09/04/2017  Patient coming from: The patient is coming from home.  Chief Complaint: Nausea and vomiting  HPI: Thomas [name] is a 56 y.o. male with Past medical history of end-stage liver disease, following up at [facility] transplant, not on list, recurrent hepatic encephalopathy, diuresis, chronic blood loss anemia, recurrent SBP on Cipro prophylaxis. Patient presented with complaints of nausea and vomiting as well as confusion. Patient was in [place] and was admitted at [facility] on Saturday night for hemoglobin of 5.5. Patient was transfused. Patient was also found to have acute kidney injury and therefore recommended not to take his Lasix and Aldactone. Patient flew back to [facility] Sunday night and called his GI doctor on Monday. Since he was doing fine recommendation was to follow-up as an outpatient sometime this week. At the time of his follow-up with GI visit today patient had some confusion as well as nausea and vomiting and increasing cough and patient was brought to the hospital for further workup and treatment. Denies any abdominal pain no fever no chills. Last BM was 09/04/2017. Did have a vomiting here in the hospital on arrival followed by some blood. Wife mentions that cough is new since his paracentesis and cough is primarily leading to his vomiting.  ED Course: Presented to clinic and from there to hospital.  At his baseline ambulates with support And is independent for most of his ADL; manages his medication on his own.  Review of Systems: as mentioned in the history of present illness.  All other systems reviewed and are negative.  Past Medical History:  Diagnosis Date  . Alcohol abuse 2013  . Alcoholic cirrhosis ([place]) 02/5464  . Anemia 02/2016   in setting of GI blood loss, but  MCV macrocytic.   . Coagulopathy ([place]) 05/2015  . Diverticulosis 09/2015  . Esophageal varices ([place]) 09/2015   small  . GERD (gastroesophageal reflux disease)   . Hyperlipidemia   . Hypertension   . Pneumonia   . Portal hypertension ([place]) 09/2015   with portal gastropathy  . Thrombocytopenia ([place]) 02/2016  . Tubular adenoma of colon 09/2015   Past Surgical History:  Procedure Laterality Date  . ESOPHAGOGASTRODUODENOSCOPY N/A 03/05/2016   Procedure: ESOPHAGOGASTRODUODENOSCOPY (EGD);  Surgeon: Ladene Artist, MD;  Location: [name];  Service: Endoscopy;  Laterality: N/A;  . ESOPHAGOGASTRODUODENOSCOPY N/A 04/29/2017   Procedure: ESOPHAGOGASTRODUODENOSCOPY (EGD);  Surgeon: Milus Banister, MD;  Location: [name];  Service: Endoscopy;  Laterality: N/A;  . ESOPHAGOGASTRODUODENOSCOPY (EGD) WITH PROPOFOL N/A 03/29/2017   Procedure: ESOPHAGOGASTRODUODENOSCOPY (EGD) WITH PROPOFOL;  Surgeon: Doran Stabler, MD;  Location: [facility];  Service: Endoscopy;  Laterality: N/A;  . IR PARACENTESIS  05/15/2017  . IR PARACENTESIS  06/11/2017  . IR PARACENTESIS  06/26/2017  . IR PARACENTESIS  07/10/2017  . IR PARACENTESIS  08/16/2017  . IR THORACENTESIS ASP PLEURAL SPACE W/IMG GUIDE  06/28/2017  . testicle prosthesis     Social History:  reports that he has never smoked. He has never used smokeless tobacco. He reports that he does not drink alcohol or use drugs.  Allergies  Allergen Reactions  . Rifaximin Swelling and Other (See Comments)    Reaction:  All over body swelling      Family History  Problem Relation Age of Onset  .  Diabetes Father   . Parkinson's disease Father   . Diabetes Paternal Aunt   . Glaucoma Mother      Prior to Admission medications   Medication Sig Start Date End Date Taking? Authorizing Provider  bismuth subsalicylate (PEPTO BISMOL) 262 MG chewable tablet Chew 262-524 mg by mouth 3 (three) times daily as needed for indigestion.   Yes [provider]    calcium carbonate (TUMS EX) 750 MG chewable tablet Chew 1-2 tablets by mouth 3 (three) times daily as needed for heartburn.   Yes [provider]  calcium-vitamin D (OSCAL WITH D) 500-200 MG-UNIT tablet Take 1 tablet by mouth 2 (two) times daily.   Yes [provider]  ciprofloxacin (CIPRO) 500 MG tablet TAKE 1 TABLET BY MOUTH EVERY EVENING 08/20/17  Yes Pyrtle, Lajuan Lines, MD  Dextromethorphan HBr (TUSSIN COUGH PO) Take 2 capsules by mouth at bedtime.    Yes [provider]  ergocalciferol (VITAMIN D2) 50000 units capsule Take 50,000 Units by mouth once a week.   Yes [provider]  ferrous sulfate 325 (65 FE) MG tablet Take 1 tablet (325 mg total) by mouth 3 (three) times daily with meals. Patient taking differently: Take 325 mg by mouth every evening.  04/30/17  Yes Patrecia Pour, Christean Grief, MD  folic acid (FOLVITE) 1 MG tablet TAKE 1 TABLET BY MOUTH AT BEDTIME 08/20/17  Yes Pyrtle, Lajuan Lines, MD  lactulose (CEPHULAC) 20 g packet Take 1 packet (20 g total) by mouth 3 (three) times daily. 08/25/17  Yes Regalado, Belkys A, MD  magnesium oxide (MAG-OX) 400 (241.3 Mg) MG tablet Take 0.5 tablets (200 mg total) by mouth 2 (two) times daily. 08/25/17  Yes Regalado, Belkys A, MD  pantoprazole (PROTONIX) 40 MG tablet TAKE 1 TABLET (40 MG TOTAL) BY MOUTH 2 (TWO) TIMES DAILY. 07/06/17  Yes Pyrtle, Lajuan Lines, MD  sucralfate (CARAFATE) 1 g tablet TAKE 1 TABLET BY MOUTH 4 TIMES DAILY 08/23/17  Yes Pyrtle, Lajuan Lines, MD  thiamine 100 MG tablet Take 1 tablet (100 mg total) by mouth daily. 06/30/17  Yes Robbie Lis, MD  furosemide (LASIX) 20 MG tablet Take 2 tablets (40 mg total) by mouth 2 (two) times daily. Patient not taking: Reported on 09/04/2017 07/06/17   [name], MD  spironolactone (ALDACTONE) 100 MG tablet Take 1.5 tablets (150 mg total) by mouth daily. Patient not taking: Reported on 09/04/2017 07/06/17   [name], MD    Physical Exam: Vitals:   09/04/17 1729  BP: 116/60  Pulse:  83  Resp: 18  Temp: 98.6 F (37 C)  TempSrc: Oral  SpO2: 100%  Weight: 77.3 kg (170 lb 6.7 oz)  Height: 5\' 5"  (1.651 m)    General: Alert, Awake and Oriented to Time, Place and Person. Appear in marked distress, affect flat Eyes: PERRL, Conjunctiva normal ENT: Oral Mucosa clear moist. Neck: difficult to assess JVD, no Abnormal Mass Or lumps Cardiovascular: S1 and S2 Present, no Murmur, Peripheral Pulses Present Respiratory: normal respiratory effort, Bilateral Air entry equal and Decreased, no use of accessory muscle, Clear to Auscultation, no Crackles, no wheezes Abdomen: Bowel Sound present, Soft and mild tenderness, no hernia Skin: no redness, no Rash, no induration Extremities: bilateral Pedal edema, no calf tenderness Neurologic: Grossly no focal neuro deficit. Bilaterally Equal motor strength Asterixis present  Labs on Admission:  CBC:  Recent Labs Lab 09/03/17 1526  WBC 5.7  NEUTROABS 4.2  HGB 8.1 aL*  HCT 23.2 cL*  MCV 103.2*  PLT 52.8*   Basic Metabolic Panel:  Recent Labs Lab 09/03/17 1524  NA 127*  K 4.8  CL 96  CO2 25  GLUCOSE 159*  BUN 34*  CREATININE 2.03*  CALCIUM 9.0   GFR: Estimated Creatinine Clearance: 39.4 mL/min (A) (by C-G formula based on SCr of 2.03 mg/dL (H)). Liver Function Tests:  Recent Labs Lab 09/03/17 1524  AST 62*  ALT 29  ALKPHOS 93  BILITOT 8.8*  PROT 5.2*  ALBUMIN 2.2*   No results for input(s): LIPASE, AMYLASE in the last 168 hours. No results for input(s): AMMONIA in the last 168 hours. Coagulation Profile:  Recent Labs Lab 09/03/17 1524  INR 2.4*   Cardiac Enzymes: No results for input(s): CKTOTAL, CKMB, CKMBINDEX, TROPONINI in the last 168 hours. BNP (last 3 results) No results for input(s): PROBNP in the last 8760 hours. HbA1C: No results for input(s): HGBA1C in the last 72 hours. CBG: No results for input(s): GLUCAP in the last 168 hours. Lipid Profile: No results for input(s): CHOL, HDL,  LDLCALC, TRIG, CHOLHDL, LDLDIRECT in the last 72 hours. Thyroid Function Tests: No results for input(s): TSH, T4TOTAL, FREET4, T3FREE, THYROIDAB in the last 72 hours. Anemia Panel: No results for input(s): VITAMINB12, FOLATE, FERRITIN, TIBC, IRON, RETICCTPCT in the last 72 hours. Urine analysis:    Component Value Date/Time   COLORURINE AMBER (A) 08/23/2017 0011   APPEARANCEUR CLEAR 08/23/2017 0011   LABSPEC 1.016 08/23/2017 0011   PHURINE 6.0 08/23/2017 0011   GLUCOSEU NEGATIVE 08/23/2017 0011   HGBUR NEGATIVE 08/23/2017 0011   BILIRUBINUR NEGATIVE 08/23/2017 0011   KETONESUR NEGATIVE 08/23/2017 0011   PROTEINUR NEGATIVE 08/23/2017 0011   UROBILINOGEN 0.2 11/22/2012 0856   NITRITE NEGATIVE 08/23/2017 0011   LEUKOCYTESUR NEGATIVE 08/23/2017 0011    Radiological Exams on Admission: No results found. Assessment/Plan 1. Acute hepatic encephalopathy. Continue lactulose every 6 hours. Check ammonia level. Give IV ceftriaxone for SBP prophylaxis. Check CBC and give IV Protonix for chronic GI blood loss. GI will follow-up on the patient.   2. Acute kidney injury. Renal function worsened from his baseline and still not getting better. Patient will be receiving IV fluid bolus per recommendation from GI. I would also give IV albumin. Paracentesis diagnostic only ordered. Monitor renal function. If further worsens we will get ultrasound renal to rule out any obstruction.  3. End-stage liver disease. Significantly high MDRD. GI will continue to follow. Holding on diuretics for now. On Cipro as an outpatient for SBP prophylaxis, changing to IV ceftriaxone due to nausea and vomiting.  4. Intractable nausea and vomiting. Etiology unclear, we'll get an x-ray abdomen. Currently nothing by mouth except medication.  5. Cough. Dry without any sputum production. Check chest x-ray.  6. Thrombocytopenia. Recheck and monitor.  7. Anemia due to chronic blood loss from  dialysis. Continue Protonix every 12 hours. Continue Carafate. GI will follow-up. Depending on the hemoglobin transfuse as needed for hemoglobin less than 7 or hemodynamic instability. He has further episodes of vomiting will need urgent GI consult overnight.  Nutrition: Nothing by mouth except medications DVT Prophylaxis: mechanical compression device  Advance goals of care discussion: full code   Consults: gastroenterology  Family Communication: family was present at bedside, at the time of interview.  Opportunity was given to ask question and all questions were answered satisfactorily.  Disposition: Admitted as inpatient, telemetry unit. Likely to be discharged home, in 2-3 days .  Author: Berle Mull, MD [facility] Pager: [phone]  09/04/2017  If 7PM-7AM, please contact night-coverage [email or web address] Password [email or web address]

## 2017-09-04 NOTE — Progress Notes (Signed)
patient accepted from Dr Hilarie Fredrickson  to [facility]   56 y.o. male with a past medical history significant for ESLD not yet on transplant list at [place], HE on lactulose, rifaximin intolerant, varices and gastropathy, chronic blood loss anemia, hx of SBP on cipro ,  Who is being admitted from [place]  For exacerbation of his underlying cirrhosis. Patient was recently admitted in [place] for coffee-ground emesis with a hemoglobin of 5, transfuse with 2 units of packed red blood cells. Presented today with acute on chronic renal failure, nonbloody emesis, hemoglobin is stable, appears to have decompensated cirrhosis and GI is requesting admission for further evaluation

## 2017-09-04 NOTE — Progress Notes (Signed)
PHARMACY NOTE -  CEFTRIAXONE  Pharmacy has been consulted to assist with dosing of Ceftriaxone  for SBP prophylaxis.  PMH significant for SBP and on SBP prophylaxis (Cipro), alcoholic cirrhosis, portal hypertension, hepatic encephalopathy, ascites, renal insufficiency, anemia, portal gastropathy.    PLAN: Ceftriaxone 2gm IV q24h - need for further dosage adjustment appears unlikely at present.    Will sign off at this time.  Please reconsult if a change in clinical status warrants re-evaluation of dosage.  Leone Haven, PharmD 09/04/17 @ 18:00

## 2017-09-04 NOTE — Patient Instructions (Signed)
Please go to [facility], admitting. You will be going to Room 1421.

## 2017-09-04 NOTE — Progress Notes (Signed)
Subjective:    Patient ID: Thomas [name], male    DOB: 1961/09/06, 56 y.o.   MRN: [id number]  HPI Thomas [name] is a 56 year old male with alcoholic cirrhosis, portal hypertension with small esophageal varices, severe portal gastropathy, hepatic encephalopathy, ascites with history of SBP on SBP prophylaxis, history of hepatic hydrothorax, history of renal insufficiency, history of recurrent anemia felt secondary to bleeding portal gastropathy who is seen urgently in [name] follow-up. He is here today with his wife.  Thomas [name] traveled with his wife last Thursday to [place] to visit family. Last Friday he had several episodes of vomiting seemingly unprovoked and nonbloody initially. Then on Saturday,09/01/2017 he had recurrent vomiting with coffee ground emesis. He was admitted to the [name] in [place] overnight where he was found to have acute renal failure with a creatinine of 2.5.He was anemic with hemoglobin of 5.9. He received 2 units of packed red cells. No intervention was performed. He was advised to hold his diuretics and follow-up as soon as he returned home.  His wife states that he has had fluctuating encephalopathy at home and today has been sleepy and it was very hard for herto get him to stand. He needed assistance. No falls. Yesterday seemed to be a better day he was able to take his lactulose and had 3-4 soft but formed stools. No report of melena or blood per rectum. No further vomiting until today when he arrived here. He had an episode of nonbloody emesis in the lobby. He was able to take lactulose this morning. His diuretics have been on hold as recommended on [name] discharge.  He has continued his other medications including daily Cipro prophylaxis, lactulose 3 times daily, Protonix twice daily, thiamine. No alcohol. He has followed with Thomas [name] and is still in the pretransplant evaluation period. He is having alcohol relapse counseling with Thomas [name] at [name]. He  needs to additional classes. He has follow-up at [name] in October  Review of Systems As per history of present illness, otherwise negative  Current Medications, Allergies, Past Medical History, Past Surgical History, Family History and Social History were reviewed in [employer] record.     Objective:   Physical Exam BP (!) 120/58   Pulse 72  Constitutional: Chronically ill-appearing jaundiced male sitting in a wheelchair HEENT: Normocephalic and atraumatic. Oropharynx is clear and moist. Conjunctivae are Pale. Sclera are icteric. Neck: Neck supple. Trachea midline. Cardiovascular: Normal rate, regular rhythm and intact distal pulses. No M/R/G Pulmonary/chest: Effort normal and breath sounds normal. No wheezing, rales or rhonchi. Abdominal: Soft, nontender,Distended but not tense, nondistended. Bowel sounds active throughout.  Extremities: no clubbing, cyanosis, 1-2+ lower extremity edema Neurological: Alert and oriented to person place and time.Positive asterixis Skin: Skin is warm and dry. Psychiatric: Normal mood and affect. Behavior is normal.  Labs on discharge dated 09/02/2017; [place], [place] WBC 8.5, hemoglobin 7.7, PLT count 101 Sodium 124, potassium 5.1, BUN 37, creatinine 2.26 (2.5 on admit) AST 67, ALT 33, alkaline phosphatase 110, total bilirubin 7.8 INR 2.4  CBC    Component Value Date/Time   WBC 5.7 09/03/2017 1526   RBC 2.25 aL (L) 09/03/2017 1526   HGB 8.1 aL (L) 09/03/2017 1526   HCT 23.2 cL (LL) 09/03/2017 1526   PLT 93.0 (L) 09/03/2017 1526   MCV 103.2 (H) 09/03/2017 1526   MCH 36.0 (H) 08/24/2017 0532   MCHC 34.8 09/03/2017 1526   RDW 25.1 (H) 09/03/2017 1526   LYMPHSABS 0.6 (L) 09/03/2017 1526  MONOABS 0.7 09/03/2017 1526   EOSABS 0.1 09/03/2017 1526   BASOSABS 0.1 09/03/2017 1526   CMP     Component Value Date/Time   NA 127 (L) 09/03/2017 1524   K 4.8 09/03/2017 1524   CL 96 09/03/2017 1524   CO2 25 09/03/2017 1524    GLUCOSE 159 (H) 09/03/2017 1524   BUN 34 (H) 09/03/2017 1524   CREATININE 2.03 (H) 09/03/2017 1524   CALCIUM 9.0 09/03/2017 1524   PROT 5.2 (L) 09/03/2017 1524   ALBUMIN 2.2 (L) 09/03/2017 1524   AST 62 (H) 09/03/2017 1524   ALT 29 09/03/2017 1524   ALKPHOS 93 09/03/2017 1524   BILITOT 8.8 (H) 09/03/2017 1524   GFRNONAA >60 08/25/2017 0545   GFRAA >60 08/25/2017 0545   Lab Results  Component Value Date   INR 2.4 (H) 09/03/2017   INR 3.10 08/25/2017   INR 2.72 08/24/2017        Assessment & Plan:  56 year old male with alcoholic cirrhosis, portal hypertension with small esophageal varices, severe portal gastropathy, hepatic encephalopathy, ascites with history of SBP on SBP prophylaxis, history of hepatic hydrothorax, history of renal insufficiency, history of recurrent anemia felt secondary to bleeding portal gastropathy who is seen urgently in [name] follow-up.  1. Decompensated alcoholic cirrhosis -- patient with acute encephalopathy, unclear cause. Also acute on chronic renal insufficiency. His creatinine is slightly better today to 2.0 however his baseline is around 0.8-0.9.I recommended admission to the [name] given acute encephalopathy and acute on chronic renal insufficiency. I discussed this with the hospitalist Dr.Abrol and he will be admitted to [facility], room 1421. GI service will follow while inpatient. I have discussed with Thomas Esterwood, PA-C and Dr. Havery Moros --Thomas [name] admission --1 L IV fluid bolus for renal insufficiency; if no response consider IV albumin, midodrine and octreotide --Diagnostic paracentesis, rule out SBP; he has been on prophylaxis so this should be less likely --Hold diuretics --Lactulose every 6 hours until having adequate bowel movements, then titrate to 3-5 stools per day based onmental status and encephalopathy --Check urine sodium --Not a TIPS candidate given encephalopathy and hyperbilirubinemia. If TIPS were to be considered it should  be at [facility] where he is being evaluated for transplant --I will reach out to his therapist, Thomas [name] with [facility] to see if we can expedite his alcohol abuse counseling sessions so that this will not be a barrier for transplant (he needs 4 sessions before he can be considered at [name] and he has had 2) --Monitor Hgb, stable after transfusion at outside [name]. Continue oral twice a day PPI

## 2017-09-05 ENCOUNTER — Inpatient Hospital Stay (HOSPITAL_COMMUNITY): Payer: BLUE CROSS/BLUE SHIELD

## 2017-09-05 ENCOUNTER — Telehealth: Payer: Self-pay | Admitting: *Deleted

## 2017-09-05 DIAGNOSIS — J918 Pleural effusion in other conditions classified elsewhere: Secondary | ICD-10-CM

## 2017-09-05 DIAGNOSIS — K769 Liver disease, unspecified: Secondary | ICD-10-CM

## 2017-09-05 DIAGNOSIS — K7031 Alcoholic cirrhosis of liver with ascites: Principal | ICD-10-CM

## 2017-09-05 DIAGNOSIS — K7290 Hepatic failure, unspecified without coma: Secondary | ICD-10-CM

## 2017-09-05 LAB — CBC
HEMATOCRIT: 19.4 % — AB (ref 39.0–52.0)
Hemoglobin: 6.9 g/dL — CL (ref 13.0–17.0)
MCH: 34.8 pg — ABNORMAL HIGH (ref 26.0–34.0)
MCHC: 35.6 g/dL (ref 30.0–36.0)
MCV: 98 fL (ref 78.0–100.0)
PLATELETS: 62 10*3/uL — AB (ref 150–400)
RBC: 1.98 MIL/uL — ABNORMAL LOW (ref 4.22–5.81)
RDW: 23.6 % — AB (ref 11.5–15.5)
WBC: 6 10*3/uL (ref 4.0–10.5)

## 2017-09-05 LAB — COMPREHENSIVE METABOLIC PANEL
ALBUMIN: 2.4 g/dL — AB (ref 3.5–5.0)
ALT: 30 U/L (ref 17–63)
ANION GAP: 8 (ref 5–15)
AST: 58 U/L — AB (ref 15–41)
Alkaline Phosphatase: 81 U/L (ref 38–126)
BILIRUBIN TOTAL: 8.3 mg/dL — AB (ref 0.3–1.2)
BUN: 30 mg/dL — AB (ref 6–20)
CHLORIDE: 100 mmol/L — AB (ref 101–111)
CO2: 26 mmol/L (ref 22–32)
Calcium: 9.4 mg/dL (ref 8.9–10.3)
Creatinine, Ser: 1.64 mg/dL — ABNORMAL HIGH (ref 0.61–1.24)
GFR calc Af Amer: 53 mL/min — ABNORMAL LOW (ref >60)
GFR calc non Af Amer: 46 mL/min — ABNORMAL LOW (ref >60)
GLUCOSE: 89 mg/dL (ref 65–99)
POTASSIUM: 4.8 mmol/L (ref 3.5–5.1)
Sodium: 134 mmol/L — ABNORMAL LOW (ref 135–145)
TOTAL PROTEIN: 5.4 g/dL — AB (ref 6.5–8.1)

## 2017-09-05 LAB — SODIUM, URINE, RANDOM: SODIUM UR: 22 mmol/L

## 2017-09-05 LAB — PROTEIN, PLEURAL OR PERITONEAL FLUID

## 2017-09-05 LAB — BODY FLUID CELL COUNT WITH DIFFERENTIAL
Lymphs, Fluid: 65 %
Monocyte-Macrophage-Serous Fluid: 33 % — ABNORMAL LOW (ref 50–90)
Neutrophil Count, Fluid: 2 % (ref 0–25)
Total Nucleated Cell Count, Fluid: 113 cu mm (ref 0–1000)

## 2017-09-05 LAB — GRAM STAIN

## 2017-09-05 LAB — PROTIME-INR
INR: 2.56
PROTHROMBIN TIME: 27.3 s — AB (ref 11.4–15.2)

## 2017-09-05 LAB — LACTATE DEHYDROGENASE, PLEURAL OR PERITONEAL FLUID: LD FL: 31 U/L — AB (ref 3–23)

## 2017-09-05 LAB — PREPARE RBC (CROSSMATCH)

## 2017-09-05 LAB — ALBUMIN, PLEURAL OR PERITONEAL FLUID: Albumin, Fluid: <1 g/dL

## 2017-09-05 MED ORDER — LACTULOSE ENEMA
300.0000 mL | Freq: Every day | ORAL | Status: DC
  Administered 2017-09-05: 300 mL via RECTAL
  Filled 2017-09-05 (×3): qty 300

## 2017-09-05 MED ORDER — SODIUM CHLORIDE 0.9 % IV SOLN
Freq: Once | INTRAVENOUS | Status: AC
  Administered 2017-09-05: 07:00:00 via INTRAVENOUS

## 2017-09-05 MED ORDER — LIDOCAINE HCL 2 % IJ SOLN
INTRAMUSCULAR | Status: AC
  Filled 2017-09-05: qty 10

## 2017-09-05 NOTE — Progress Notes (Signed)
PT Cancellation Note  Patient Details Name: Carvin Almas MRN: [id number] DOB: August 19, 1961   Cancelled Treatment:    Reason Eval/Treat Not Completed: Medical issues which prohibited therapy . Hgb 6.9. Will; check back after receives blood, later today as schedule allows.  Claretha Cooper 09/05/2017, 7:55 AM Tresa Endo PT [phone]

## 2017-09-05 NOTE — Progress Notes (Signed)
OT Cancellation Note  Patient Details Name: Thomas [name] MRN: [id number] DOB: 04-15-1961   Cancelled Treatment:    Reason Eval/Treat Not Completed: Patient not medically ready  Medical issues which prohibited therapy . Hgb 6.9. Will; check back after receives blood, later today as schedule allows.  Mickel Baas [place], [place] 09/05/2017, 2:52 PM

## 2017-09-05 NOTE — Telephone Encounter (Signed)
Ive advised Thomas [name] (Thomas [name] is currently hospitalized) that Dr Hilarie Fredrickson has been in touch with behavioral health and has gotten appointments for October 4th and October 16th for Thomas [name]'s therapy sessions needed so he will be eligible for transplant. Also advised that he has been placed on cancellation list and should be available if he is called to come in for sessions earlier. Wife verbalizes understanding.

## 2017-09-05 NOTE — Progress Notes (Addendum)
Patient ID: Thomas [name], male   DOB: [birth year], 56 y.o.   MRN: [id number]  PROGRESS NOTE    Espiridion Supinski  LNL:[id number] DOB: 1961/05/25 DOA: 09/04/2017  PCP: Alroy Dust, L.Marlou Sa, MD   Brief Narrative:  56 year old male with end-stage liver disease, following up lipid U transplant, not on the list, recurrent hepatic encephalopathy, chronic blood loss anemia, recurrent SBP on Cipro prophylaxis. Patient presented to ED with complaints of nausea and vomiting and confusion. He is being treated for acute hepatic encephalopathy and was also found to have acute kidney injury. Last paracentesis was 08/25/2017 with 2.8 L of fluid removed. Patient had right thoracentesis 08/24/2017 with 1.7 L fluid removed.  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  Objective: Vitals:   09/05/17 1015 09/05/17 1045 09/05/17 1306 09/05/17 1515  BP:  113/60 (!) 118/58 (!) 120/55 (!) 101/55  Pulse: 94 89 80   Resp: 18 16 18    Temp: 98.6 F (37 C) 98.4 F (36.9 C) 98.4 F (36.9 C)   TempSrc: Oral Oral Oral   SpO2: 92% 95% 95%   Weight:      Height:        Intake/Output Summary (Last 24 hours) at 09/05/17 1532 Last data filed at 09/05/17 1306  Gross per 24 hour  Intake            482.5 ml  Output              425 ml  Net             57.5 ml   Filed Weights   09/04/17 1729 09/05/17 0500  Weight: 77.3 kg (170 lb 6.7 oz) 79 kg (174 lb 2.6 oz)    Examination:  General exam: Appears calm and comfortable  Respiratory system: Diminished, no wheezing  Cardiovascular system: S1 & S2 heard, Rate controlled  Gastrointestinal system: Abdomen is nondistended, soft and nontender.  Central nervous system: Drowsy, responds when called his name but easily slips back to slep Extremities: edema in LE Skin: No rashes, lesions or ulcers Psychiatry: Judgement and insight appear normal. Mood & affect appropriate.   Data Reviewed: I have personally reviewed following labs and imaging studies  CBC:  Recent Labs Lab 09/03/17 1526 09/04/17 1802 09/05/17 0611  WBC 5.7 6.8 6.0  NEUTROABS 4.2 4.7  --   HGB 8.1 aL*  8.1* 6.9*  HCT 23.2 cL* 23.3* 19.4*  MCV 103.2* 97.5 98.0  PLT 93.0* 86* 62*   Basic Metabolic Panel:  Recent Labs Lab 09/03/17 1524 09/04/17 1802 09/05/17 0611  NA 127* 132* 134*  K 4.8 4.8 4.8  CL 96 98* 100*  CO2 25 23 26   GLUCOSE 159* 112* 89  BUN 34* 31* 30*  CREATININE 2.03* 1.84* 1.64*  CALCIUM 9.0 9.9 9.4  MG  --  2.2  --   PHOS  --  4.8*  --    GFR: Estimated Creatinine Clearance: 49.3 mL/min (A) (by C-G formula based on SCr of 1.64 mg/dL (H)). Liver Function Tests:  Recent Labs Lab 09/03/17 1524 09/04/17 1802 09/05/17 0611  AST 62* 79* 58*  ALT 29 42 30  ALKPHOS 93 116 81  BILITOT 8.8* 9.3* 8.3*  PROT 5.2* 6.3* 5.4*  ALBUMIN 2.2* 2.6* 2.4*   No results for input(s): LIPASE, AMYLASE in the  last 168 hours.  Recent Labs Lab 09/04/17 1920  AMMONIA 86*   Coagulation Profile:  Recent Labs Lab 09/03/17 1524 09/04/17 1921 09/05/17 0611  INR 2.4* 2.34 2.56   Cardiac Enzymes: No results for input(s): CKTOTAL, CKMB, CKMBINDEX, TROPONINI in the last 168 hours. BNP (last 3 results) No results for input(s): PROBNP in the last 8760 hours. HbA1C: No results for input(s): HGBA1C in the last 72 hours. CBG: No results for input(s): GLUCAP in the last 168 hours. Lipid Profile: No results for input(s): CHOL, HDL, LDLCALC, TRIG, CHOLHDL, LDLDIRECT in the last 72 hours. Thyroid Function Tests: No results for input(s): TSH, T4TOTAL, FREET4, T3FREE, THYROIDAB in the last 72 hours. Anemia Panel:  Recent Labs  09/04/17 1802  RETICCTPCT 6.1*   Urine analysis:    Component Value Date/Time   COLORURINE AMBER (A) 08/23/2017 0011   APPEARANCEUR CLEAR 08/23/2017 0011   LABSPEC 1.016 08/23/2017 0011   PHURINE 6.0 08/23/2017 0011   GLUCOSEU NEGATIVE 08/23/2017 0011   HGBUR NEGATIVE 08/23/2017 0011   BILIRUBINUR NEGATIVE 08/23/2017 0011   KETONESUR NEGATIVE 08/23/2017 0011   PROTEINUR NEGATIVE 08/23/2017 0011   UROBILINOGEN 0.2 11/22/2012 0856   NITRITE NEGATIVE 08/23/2017 0011   LEUKOCYTESUR NEGATIVE 08/23/2017 0011   Sepsis Labs: @LABRCNTIP (procalcitonin:4,lacticidven:4)   )No results found for this or any previous visit (from the past 240 hour(s)).    Radiology Studies: Dg Abd Acute W/chest  Result Date: 09/04/2017 CLINICAL DATA:  Initial evaluation for acute cough, nausea and vomiting. EXAM: DG ABDOMEN ACUTE W/ 1V CHEST COMPARISON:  Prior radiograph from 08/24/2017. FINDINGS: Mild cardiomegaly, stable. Mediastinal silhouette within normal limits. There has been reaccumulation of a right pleural effusion, now large in size, extending superiorly over the right lung apex. Associated volume loss/atelectasis within the right lung. Left lung is grossly clear. Underlying  pulmonary vascular congestion without frank pulmonary edema. No pneumothorax. Visualized bowel gas pattern within normal limits without evidence for obstruction or ileus. No appreciable abnormal bowel wall thickening. No free air seen on decubitus view. Calcified gallstone overlies the right upper quadrant. Additional calcific density overlying the left sacrum of uncertain etiology, but may reflect a calcified diverticulum. No acute osseous abnormality. IMPRESSION: 1. Re-accumulation of a large right pleural effusion with associated right lung volume loss. 2. Underlying pulmonary vascular congestion without frank pulmonary edema. 3. Nonobstructive bowel gas pattern. No radiographic evidence for acute intra-abdominal pathology. 4. Cholelithiasis. Electronically Signed   By: Jeannine Boga M.D.   On: 09/04/2017 22:36      Scheduled Meds: . benzonatate  100 mg Oral BID  . lactulose  30 g Oral TID  . lactulose  300 mL Rectal Daily  . lidocaine      . pantoprazole (PROTONIX) IV  40 mg Intravenous Q12H  . sucralfate  1 g Oral TID WC & HS  . thiamine  100 mg Oral Daily   Continuous Infusions: . cefTRIAXone (ROCEPHIN)  IV Stopped (09/04/17 2330)     LOS: 1 day    Time spent: 25 minutes  Greater than 50% of the time spent on counseling and coordinating the care.   Leisa Lenz, MD [facility] Pager [phone]  If 7PM-7AM, please contact night-coverage [email or web address] Password [facility] 09/05/2017, 3:32 PM

## 2017-09-05 NOTE — Progress Notes (Addendum)
CRITICAL VALUE ALERT  Critical Value:  hgb 6.9  Date & Time Notied:  06/05/17 0650  Provider Notified: Schorr  Orders Received/Actions taken: 1 unit PRBC ordered

## 2017-09-05 NOTE — Procedures (Signed)
Ultrasound-guided diagnostic and therapeutic right thoracentesis performed yielding 1.1 liters of hazy, amber fluid. No immediate complications. Follow-up chest x-ray pending.A portion of the fluid was sent to the lab for preordered studies. Due to hypotension and onset of coughing only the above amount of fluid was removed today.

## 2017-09-05 NOTE — Progress Notes (Signed)
Patient ID: Thomas [name], male   DOB: 1961-03-07, 56 y.o.   MRN: [id number]     Progress Note   Subjective   Awake but mentating very slowly, confused to month, year. He denies any abdominal pain or shortness of breath, He denied any further vomiting to me but nurse reports that he did vomit with lactulose last night.    Objective   Vital signs in last 24 hours: Temp:  [98.2 F (36.8 C)-98.6 F (37 C)] 98.2 F (36.8 C) (09/25 2306) Pulse Rate:  [72-89] 89 (09/25 2306) Resp:  [16-18] 16 (09/25 2306) BP: (107-123)/(57-66) 107/57 (09/25 2306) SpO2:  [96 %-100 %] 96 % (09/25 2306) Weight:  [170 lb 6.7 oz (77.3 kg)-174 lb 2.6 oz (79 kg)] 174 lb 2.6 oz (79 kg) (09/26 0500) Last BM Date: 09/04/17 General:    white male in NAD-jaundiced, somnolent,  Heart:  Regular rate and rhythm; no murmurs Lungs:Decreased breath sounds right lung up two thirds  Abdomen:  Soft, nontender , non-tense ascites, bowel sounds active no palpable mass  Extremities:  Without edema. Neurologic:  Alert, oriented to place, disoriented to month and year, movements very slow, positive asterixis. Psych:  Cooperative.  Intake/Output from previous day: 09/25 0701 - 09/26 0700 In: 50 [IV Piggyback:50] Out: 225 [Urine:225] Intake/Output this shift: No intake/output data recorded.  Lab Results:  Recent Labs  09/03/17 1526 09/04/17 1802 09/05/17 0611  WBC 5.7 6.8 6.0  HGB 8.1 aL* 8.1* 6.9*  HCT 23.2 cL* 23.3* 19.4*  PLT 93.0* 86* 62*   BMET  Recent Labs  09/03/17 1524 09/04/17 1802 09/05/17 0611  NA 127* 132* 134*  K 4.8 4.8 4.8  CL 96 98* 100*  CO2 25 23 26   GLUCOSE 159* 112* 89  BUN 34* 31* 30*  CREATININE 2.03* 1.84* 1.64*  CALCIUM 9.0 9.9 9.4   LFT  Recent Labs  09/05/17 0611  PROT 5.4*  ALBUMIN 2.4*  AST 58*  ALT 30  ALKPHOS 81  BILITOT 8.3*   PT/INR  Recent Labs  09/04/17 1921 09/05/17 0611  LABPROT 25.4* 27.3*  INR 2.34 2.56    Studies/Results: Dg Abd Acute  W/chest  Result Date: 09/04/2017 CLINICAL DATA:  Initial evaluation for acute cough, nausea and vomiting. EXAM: DG ABDOMEN ACUTE W/ 1V CHEST COMPARISON:  Prior radiograph from 08/24/2017. FINDINGS: Mild cardiomegaly, stable. Mediastinal silhouette within normal limits. There has been reaccumulation of a right pleural effusion, now large in size, extending superiorly over the right lung apex. Associated volume loss/atelectasis within the right lung. Left lung is grossly clear. Underlying pulmonary vascular congestion without frank pulmonary edema. No pneumothorax. Visualized bowel gas pattern within normal limits without evidence for obstruction or ileus. No appreciable abnormal bowel wall thickening. No free air seen on decubitus view. Calcified gallstone overlies the right upper quadrant. Additional calcific density overlying the left sacrum of uncertain etiology, but may reflect a calcified diverticulum. No acute osseous abnormality. IMPRESSION: 1. Re-accumulation of a large right pleural effusion with associated right lung volume loss. 2. Underlying pulmonary vascular congestion without frank pulmonary edema. 3. Nonobstructive bowel gas pattern. No radiographic evidence for acute intra-abdominal pathology. 4. Cholelithiasis. Electronically Signed   By: Jeannine Boga M.D.   On: 09/04/2017 22:36       Assessment / Plan:    #79   56 year old male with decompensated alcoholic cirrhosis, with portal hypertension, small esophageal varices and severe portal gastropathy. He has had hepatic encephalopathy, prior episode of SBP and on chronic SBP  prophylaxis. Also with hepatic hydrothorax, chronic renal insufficiency and recurrent severe anemia felt secondary to chronic oozing from portal gastropathy.  Patient has been evaluated at [facility], he is not listed for transplant as yet as he has not completed alcohol abuse counseling sessions (has completed 2 of 4 required sessions)  Patient admitted yesterday  after being evaluated in the office and noted to have significant encephalopathy, in addition to acute on chronic renal insufficiency(with slight improvement off diuretics over the past couple days)  His encephalopathy is worse today, had vomited Chronulac last evening. Chest x-ray shows large right pleural effusion with atelectasis no definite pneumonia.  Patient not felt to be a TIPS candidate secondary to ongoing issues with encephalopathy, hyperbilirubinemia  MELD =29    #2 Profound anemia-received 1 unit of packed RBCs last evening  Plan;-  #1 patient had been scheduled for diagnostic paracentesis, have discussed case with Dr. Havery Moros and Dr. Hilarie Fredrickson Will schedule for diagnostic and therapeutic right thoracentesis today  #2 patient received IV albumin last evening,  #3 Continue oral Chronulac  30 g 3 times daily ,and add Chronulac enema once daily #4 transfuse to keep hemoglobin 7 #5 continue holding diuretics #6 repeat labs in a.m. #7 If patient shows evidence of further decompensation during this admission he may require transfer to [facility].    Discharge Planning    Contact  Amy Lineville, P.A.-C               [phone]      Active Problems:   Acute hepatic encephalopathy     LOS: 1 day   Amy Esterwood  09/05/2017, 9:47 AM

## 2017-09-06 DIAGNOSIS — K7200 Acute and subacute hepatic failure without coma: Secondary | ICD-10-CM

## 2017-09-06 DIAGNOSIS — N179 Acute kidney failure, unspecified: Secondary | ICD-10-CM

## 2017-09-06 LAB — BASIC METABOLIC PANEL
ANION GAP: 7 (ref 5–15)
BUN: 27 mg/dL — ABNORMAL HIGH (ref 6–20)
CALCIUM: 9.4 mg/dL (ref 8.9–10.3)
CO2: 26 mmol/L (ref 22–32)
Chloride: 105 mmol/L (ref 101–111)
Creatinine, Ser: 1.34 mg/dL — ABNORMAL HIGH (ref 0.61–1.24)
GFR, EST NON AFRICAN AMERICAN: 58 mL/min — AB (ref >60)
Glucose, Bld: 96 mg/dL (ref 65–99)
POTASSIUM: 4.6 mmol/L (ref 3.5–5.1)
Sodium: 138 mmol/L (ref 135–145)

## 2017-09-06 LAB — AMMONIA: AMMONIA: 33 umol/L (ref 9–35)

## 2017-09-06 LAB — CBC
HCT: 23.2 % — ABNORMAL LOW (ref 39.0–52.0)
Hemoglobin: 8 g/dL — ABNORMAL LOW (ref 13.0–17.0)
MCH: 33.6 pg (ref 26.0–34.0)
MCHC: 34.5 g/dL (ref 30.0–36.0)
MCV: 97.5 fL (ref 78.0–100.0)
PLATELETS: 61 10*3/uL — AB (ref 150–400)
RBC: 2.38 MIL/uL — AB (ref 4.22–5.81)
RDW: 25.6 % — AB (ref 11.5–15.5)
WBC: 5.3 10*3/uL (ref 4.0–10.5)

## 2017-09-06 LAB — TYPE AND SCREEN
ABO/RH(D): A POS
ANTIBODY SCREEN: NEGATIVE
UNIT DIVISION: 0

## 2017-09-06 LAB — BPAM RBC
Blood Product Expiration Date: 201810102359
ISSUE DATE / TIME: 201809261014
Unit Type and Rh: 6200

## 2017-09-06 NOTE — Evaluation (Signed)
Physical Therapy Evaluation Patient Details Name: Thomas [name] MRN: [id number] DOB: 1961-05-20 Today's Date: 09/06/2017   History of Present Illness  56 y/o male with severely decompensated alcoholic cirrhosis - admitted with worsening encephalopathy, renal insufficiency, and noted to have recurrent R sided effusion is consistent with hepatic hydrothorax; s/p thoracentesis 09/05/17  Clinical Impression  Pt admitted with above diagnosis. Pt currently with functional limitations due to the deficits listed below (see PT Problem List).  Pt will benefit from skilled PT to increase their independence and safety with mobility to allow discharge to the venue listed below.  Pt assisted with ambulated in hallway and appears very unsteady however cognition improved today per family present.  Pt has SPC at home, may benefit from RW for safety.  Pt's spouse reports pt attempting to prepare for liver transplant consult with [facility].  Pt encouraged to remain OOB in recliner today and ambulate with staff as tolerated.     Follow Up Recommendations Supervision for mobility/OOB;No PT follow up    Equipment Recommendations  Rolling walker with 5" wheels    Recommendations for Other Services       Precautions / Restrictions Precautions Precautions: Fall      Mobility  Bed Mobility Overal bed mobility: Modified Independent                Transfers Overall transfer level: Needs assistance Equipment used: None Transfers: Sit to/from Stand Sit to Stand: Min guard         General transfer comment: min/guard for safety  Ambulation/Gait Ambulation/Gait assistance: Min assist Ambulation Distance (Feet): 400 Feet Assistive device: None Gait Pattern/deviations: Step-through pattern;Decreased stride length     General Gait Details: pt declined using RW today, occasional grazing of hand rail, assist for steadying, cues for slowing pace, pt with uncontrolled, uncoordinated gait  Stairs             Wheelchair Mobility    Modified Rankin (Stroke Patients Only)       Balance Overall balance assessment: Needs assistance         Standing balance support: No upper extremity supported Standing balance-Leahy Scale: Fair               High level balance activites: Turns;Sudden stops;Head turns High Level Balance Comments: requires steadying assist for above acitvities             Pertinent Vitals/Pain Pain Assessment: No/denies pain    Home Living Family/patient expects to be discharged to:: Private residence Living Arrangements: Spouse/significant other   Type of Home: House Home Access: Stairs to enter Entrance Stairs-Rails: None Entrance Stairs-Number of Steps: 4 Home Layout: One level Home Equipment: Cane - single point Additional Comments: spouse reports pt is trying to prepare for [facility] visit for consideration of liver transplant    Prior Function Level of Independence: Independent with assistive device(s)         Comments: occasionally using RW     Hand Dominance        Extremity/Trunk Assessment        Lower Extremity Assessment Lower Extremity Assessment: Generalized weakness    Cervical / Trunk Assessment Cervical / Trunk Assessment: Normal  Communication   Communication: No difficulties  Cognition Arousal/Alertness: Awake/alert Behavior During Therapy: WFL for tasks assessed/performed Overall Cognitive Status: Within Functional Limits for tasks assessed  General Comments: spouse reports pt more appropriate and less confused this morning      General Comments      Exercises     Assessment/Plan    PT Assessment Patient needs continued PT services  PT Problem List Decreased strength;Decreased mobility;Decreased activity tolerance;Decreased balance;Decreased knowledge of use of DME       PT Treatment Interventions DME instruction;Therapeutic exercise;Therapeutic  activities;Patient/family education;Gait training;Functional mobility training;Stair training;Balance training    PT Goals (Current goals can be found in the Care Plan section)  Acute Rehab PT Goals PT Goal Formulation: With patient/family Time For Goal Achievement: 09/13/17 Potential to Achieve Goals: Good    Frequency Min 3X/week   Barriers to discharge        Co-evaluation               AM-PAC PT "6 Clicks" Daily Activity  Outcome Measure Difficulty turning over in bed (including adjusting bedclothes, sheets and blankets)?: None Difficulty moving from lying on back to sitting on the side of the bed? : A Little Difficulty sitting down on and standing up from a chair with arms (e.g., wheelchair, bedside commode, etc,.)?: A Little Help needed moving to and from a bed to chair (including a wheelchair)?: A Little Help needed walking in hospital room?: A Little Help needed climbing 3-5 steps with a railing? : A Little 6 Click Score: 19    End of Session Equipment Utilized During Treatment: Gait belt Activity Tolerance: Patient tolerated treatment well Patient left: in chair;with chair alarm set;with call bell/phone within reach;with family/visitor present   PT Visit Diagnosis: Difficulty in walking, not elsewhere classified (R26.2)    Time: 3094-0768 PT Time Calculation (min) (ACUTE ONLY): 19 min   Charges:   PT Evaluation $PT Eval Low Complexity: 1 Low     PT G CodesCarmelia Bake, PT, DPT 09/06/2017 Pager: [phone]   York Ram E 09/06/2017, 12:31 PM

## 2017-09-06 NOTE — Evaluation (Signed)
Occupational Therapy Evaluation Patient Details Name: Thomas [name] MRN: [id number] DOB: 03-Jan-1961 Today's Date: 09/06/2017    History of Present Illness 56 y/o male with severely decompensated alcoholic cirrhosis - admitted with worsening encephalopathy, renal insufficiency, and noted to have recurrent R sided effusion is consistent with hepatic hydrothorax; s/p thoracentesis 09/05/17   Clinical Impression  Pt admitted with cirrhosis. Pt currently with functional limitations due to the deficits listed below (see OT Problem List). Pt will benefit from skilled OT to increase their safety and independence with ADL and functional mobility for ADL to facilitate discharge to venue listed below.     Follow Up Recommendations  No OT follow up    Equipment Recommendations  None recommended by OT    Recommendations for Other Services       Precautions / Restrictions Precautions Precautions: Fall      Mobility Bed Mobility Overal bed mobility: Modified Independent                Transfers Overall transfer level: Needs assistance Equipment used: None Transfers: Sit to/from Stand Sit to Stand: Min guard         General transfer comment: min/guard for safety        ADL either performed or assessed with clinical judgement   ADL Overall ADL's : Needs assistance/impaired Eating/Feeding: Set up;Sitting   Grooming: Set up;Sitting   Upper Body Bathing: Set up;Sitting   Lower Body Bathing: Moderate assistance;Sit to/from stand Lower Body Bathing Details (indicate cue type and reason): would benefit from AE Upper Body Dressing : Minimal assistance;Sitting   Lower Body Dressing: Moderate assistance;Cueing for safety;Sit to/from stand;Cueing for sequencing Lower Body Dressing Details (indicate cue type and reason): would benefit from aE               General ADL Comments: wife reports pt lack of activity. OT educated pt on importance of ADL activity and staying mobile  and active. Pt is hopeful for a liver and needs to stay in good shape     Vision Patient Visual Report: No change from baseline              Pertinent Vitals/Pain Pain Assessment: No/denies pain        Extremity/Trunk Assessment Upper Extremity Assessment Upper Extremity Assessment: Generalized weakness           Communication Communication Communication: No difficulties   Cognition Arousal/Alertness: Awake/alert Behavior During Therapy: WFL for tasks assessed/performed Overall Cognitive Status: Within Functional Limits for tasks assessed                                                Home Living Family/patient expects to be discharged to:: Private residence Living Arrangements: Spouse/significant other Available Help at Discharge: Family Type of Home: House Home Access: Stairs to enter Technical brewer of Steps: 4 Entrance Stairs-Rails: None Home Layout: One level               Home Equipment: Cane - single point   Additional Comments: spouse reports pt is trying to prepare for [facility] visit for consideration of liver transplant      Prior Functioning/Environment Level of Independence: Independent with assistive device(s)        Comments: occasionally using RW        OT Problem List: Decreased strength;Decreased activity tolerance;Decreased safety awareness;Decreased knowledge of use  of DME or AE      OT Treatment/Interventions: Self-care/ADL training;Patient/family education;Energy conservation;DME and/or AE instruction    OT Goals(Current goals can be found in the care plan section) Acute Rehab OT Goals Patient Stated Goal: get stonger for liver transplant OT Goal Formulation: With patient/family Time For Goal Achievement: 09/20/17 Potential to Achieve Goals: Good  OT Frequency: Min 2X/week                 End of Session Nurse Communication: Mobility status  Activity Tolerance: Patient limited by  fatigue Patient left: in bed;with call bell/phone within reach  OT Visit Diagnosis: Unsteadiness on feet (R26.81)                Time: 1859-0931 OT Time Calculation (min): 14 min Charges:  OT General Charges $OT Visit: 1 Visit OT Evaluation $OT Eval Moderate Complexity: 1 Mod G-Codes:     Kari Baars, OT [phone]  Payton Mccallum D 09/06/2017, 7:40 PM

## 2017-09-06 NOTE — Care Management Note (Signed)
Case Management Note  Patient Details  Name: Thomas [name] MRN: [id number] Date of Birth: December 08, 1961  Subjective/Objective:  56 y/o m admitted w/Acute hepatic encephalopathy. From home. Readmit 9/12-9/15.                  Action/Plan:d/c plan home.   Expected Discharge Date:                  Expected Discharge Plan:  Home/Self Care  In-House Referral:     Discharge planning Services  CM Consult  Post Acute Care Choice:    Choice offered to:     DME Arranged:    DME Agency:     HH Arranged:    HH Agency:     Status of Service:  In process, will continue to follow  If discussed at Long Length of Stay Meetings, dates discussed:    Additional Comments:  Dessa Phi, RN 09/06/2017, 3:59 PM

## 2017-09-06 NOTE — Progress Notes (Signed)
Patient ID: Thomas [name], male   DOB: 1961/10/13, 56 y.o.   MRN: [id number]  PROGRESS NOTE    Thomas [name]  WUJ:[id number] DOB: 09-02-1961 DOA: 09/04/2017  PCP: Alroy Dust, L.Marlou Sa, MD   Brief Narrative:  56 year old male with end-stage liver disease, following up lipid U transplant, not on the list, recurrent hepatic encephalopathy, chronic blood loss anemia, recurrent SBP on Cipro prophylaxis. Patient presented to ED with complaints of nausea and vomiting and confusion. He is being treated for acute hepatic encephalopathy and was also found to have acute kidney injury. Last paracentesis was 08/25/2017 with 2.8 L of fluid removed. Patient had right thoracentesis 08/24/2017 with 1.7 L fluid removed.  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  Objective: Vitals:   09/05/17 1531 09/05/17 1605 09/05/17 2105 09/06/17 0529  BP: (!) 88/48 (!) 107/55 116/67 109/64    Pulse:  77 81 81  Resp:  18 20 18   Temp:  98.5 F (36.9 C) 98.8 F (37.1 C) 98.4 F (36.9 C)  TempSrc:  Oral Oral Oral  SpO2:  95% 97% 94%  Weight:    74.5 kg (164 lb 3.9 oz)  Height:        Intake/Output Summary (Last 24 hours) at 09/06/17 1252 Last data filed at 09/06/17 1243  Gross per 24 hour  Intake            892.5 ml  Output                0 ml  Net            892.5 ml   Filed Weights   09/04/17 1729 09/05/17 0500 09/06/17 0529  Weight: 77.3 kg (170 lb 6.7 oz) 79 kg (174 lb 2.6 oz) 74.5 kg (164 lb 3.9 oz)    Physical Exam  Constitutional: Appears well-developed and well-nourished. No distress.  CVS: RRR, S1/S2 + Pulmonary: Effort and breath sounds normal, no wheezing  Abdominal: Soft. BS +,  Softly distended  Musculoskeletal: Normal range of motion. No edema and no tenderness.  Lymphadenopathy: No lymphadenopathy noted, cervical, inguinal. Neuro: Alert. No focal deficits  Skin: Skin is warm and dry.  Psychiatric: More alert this am, not agitated     Data Reviewed: I have personally reviewed following labs and imaging studies  CBC:  Recent Labs Lab 09/03/17 1526 09/04/17 1802 09/05/17 9562 09/06/17 1308  WBC 5.7 6.8 6.0 5.3  NEUTROABS 4.2 4.7  --   --   HGB 8.1 aL* 8.1* 6.9* 8.0*  HCT 23.2 cL* 23.3* 19.4* 23.2*  MCV 103.2* 97.5 98.0 97.5  PLT 93.0* 86* 62* 61*   Basic Metabolic Panel:  Recent Labs Lab 09/03/17 1524 09/04/17 1802 09/05/17 0611 09/06/17 0526  NA 127* 132* 134* 138  K 4.8 4.8 4.8 4.6  CL 96 98* 100* 105  CO2 25 23 26 26   GLUCOSE 159* 112* 89 96  BUN 34* 31* 30* 27*  CREATININE 2.03* 1.84* 1.64* 1.34*  CALCIUM 9.0 9.9 9.4 9.4  MG  --  2.2  --   --   PHOS  --  4.8*  --   --    GFR: Estimated Creatinine Clearance: 58.8 mL/min (A) (by C-G formula based on SCr of 1.34 mg/dL (H)). Liver Function Tests:  Recent Labs Lab 09/03/17 1524 09/04/17 1802 09/05/17 0611  AST 62* 79* 58*  ALT 29 42 30  ALKPHOS 93 116 81   BILITOT 8.8* 9.3* 8.3*  PROT 5.2* 6.3* 5.4*  ALBUMIN 2.2* 2.6* 2.4*   No results for input(s): LIPASE, AMYLASE in the last 168 hours.  Recent Labs Lab 09/04/17 1920 09/06/17 0526  AMMONIA 86* 33   Coagulation Profile:  Recent Labs Lab 09/03/17 1524 09/04/17 1921 09/05/17 0611  INR 2.4* 2.34 2.56   Cardiac Enzymes: No results for input(s): CKTOTAL, CKMB, CKMBINDEX, TROPONINI in the last 168 hours. BNP (last 3 results) No results for input(s): PROBNP in the last 8760 hours. HbA1C: No results for input(s): HGBA1C in the last 72 hours. CBG: No results for input(s): GLUCAP in the last 168 hours. Lipid Profile: No results for input(s): CHOL, HDL, LDLCALC, TRIG, CHOLHDL, LDLDIRECT in the last 72 hours. Thyroid Function Tests: No results for input(s): TSH, T4TOTAL, FREET4, T3FREE, THYROIDAB in the last 72 hours. Anemia Panel:  Recent Labs  09/04/17 1802  RETICCTPCT 6.1*   Urine analysis:    Component Value Date/Time   COLORURINE AMBER (A) 08/23/2017 0011   APPEARANCEUR CLEAR 08/23/2017 0011   LABSPEC 1.016 08/23/2017 0011   PHURINE 6.0 08/23/2017 0011   GLUCOSEU NEGATIVE 08/23/2017 0011   HGBUR NEGATIVE 08/23/2017 0011   BILIRUBINUR NEGATIVE 08/23/2017 0011   KETONESUR NEGATIVE 08/23/2017 0011   PROTEINUR NEGATIVE 08/23/2017 0011   UROBILINOGEN 0.2 11/22/2012 0856   NITRITE NEGATIVE 08/23/2017 0011   LEUKOCYTESUR NEGATIVE 08/23/2017 0011   Sepsis Labs: @LABRCNTIP (procalcitonin:4,lacticidven:4)   ) Recent Results (from the past 240 hour(s))  Gram stain     Status: None   Collection Time: 09/05/17  3:12 PM  Result Value Ref Range Status   Specimen Description FLUID RIGHT PLEURAL  Final   Special Requests NONE  Final   Gram Stain   Final    ABUNDANT WBC PRESENT, PREDOMINANTLY MONONUCLEAR NO ORGANISMS SEEN Performed at [facility], [place] [address], [place], [place] [address]    Report Status 09/05/2017 FINAL  Final      Radiology Studies: Dg  Chest 1 View  Result Date: 09/05/2017 CLINICAL DATA:  Post thoracentesis EXAM: CHEST 1 VIEW COMPARISON:  09/04/2017, 08/24/2017 FINDINGS: Decreased right pleural effusion post thoracentesis. No pneumothorax. Moderate residual effusion noted. Suspect tiny left effusion. Low lung volumes. Enlarged cardiomediastinal silhouette with central vascular congestion. Atelectasis or infiltrate at the right middle lobe and right lung base. IMPRESSION: 1. Decreased right pleural effusion post thoracentesis with no pneumothorax. Moderate residual right pleural effusion noted 2. Consolidation in  the right middle lobe and right lung base which may reflect atelectasis or pneumonia 3. Cardiomegaly with central vascular congestion. Electronically Signed   By: Donavan Foil M.D.   On: 09/05/2017 15:59   Dg Abd Acute W/chest  Result Date: 09/04/2017 CLINICAL DATA:  Initial evaluation for acute cough, nausea and vomiting. EXAM: DG ABDOMEN ACUTE W/ 1V CHEST COMPARISON:  Prior radiograph from 08/24/2017. FINDINGS: Mild cardiomegaly, stable. Mediastinal silhouette within normal limits. There has been reaccumulation of a right pleural effusion, now large in size, extending superiorly over the right lung apex. Associated volume loss/atelectasis within the right lung. Left lung is grossly clear. Underlying pulmonary vascular congestion without frank pulmonary edema. No pneumothorax. Visualized bowel gas pattern within normal limits without evidence for obstruction or ileus. No appreciable abnormal bowel wall thickening. No free air seen on decubitus view. Calcified gallstone overlies the right upper quadrant. Additional calcific density overlying the left sacrum of uncertain etiology, but may reflect a calcified diverticulum. No acute osseous abnormality. IMPRESSION: 1. Re-accumulation of a large right pleural effusion with associated right lung volume loss. 2. Underlying pulmonary vascular congestion without frank pulmonary edema. 3.  Nonobstructive bowel gas pattern. No radiographic evidence for acute intra-abdominal pathology. 4. Cholelithiasis. Electronically Signed   By: Jeannine Boga M.D.   On: 09/04/2017 22:36   US Thoracentesis Asp Pleural Space W/img Guide  Result Date: 09/05/2017 INDICATION: Cirrhosis, encephalopathy, hepatic hydrothorax, renal insufficiency; request made for diagnostic and therapeutic right thoracentesis. EXAM: ULTRASOUND GUIDED DIAGNOSTIC AND THERAPEUTIC RIGHT THORACENTESIS MEDICATIONS: None. COMPLICATIONS: None immediate. PROCEDURE: An ultrasound guided thoracentesis was thoroughly discussed with the patient and questions answered. The benefits, risks, alternatives and complications were also discussed. The patient understands and wishes to proceed with the procedure. Written consent was obtained. Ultrasound was performed to localize and [place] an adequate pocket of fluid in the right chest. The area was then prepped and draped in the normal sterile fashion. 2% Lidocaine was used for local anesthesia. Under ultrasound guidance a Safe-T-Centesis catheter was introduced. Thoracentesis was performed. The catheter was removed and a dressing applied. FINDINGS: A total of approximately 1.1 liters of hazy, amber fluid was removed. Samples were sent to the laboratory as requested by the clinical team. Due to onset of patient coughing and hypotension only the above amount of fluid was removed today. IMPRESSION: Successful ultrasound guided diagnostic and therapeutic right thoracentesis yielding 1.1 liters of pleural fluid. Read by: Rowe Robert, PA-C Electronically Signed   By: Jacqulynn Cadet M.D.   On: 09/05/2017 16:08      Scheduled Meds: . benzonatate  100 mg Oral BID  . lactulose  30 g Oral TID  . lactulose  300 mL Rectal Daily  . pantoprazole (PROTONIX) IV  40 mg Intravenous Q12H  . sucralfate  1 g Oral TID WC & HS  . thiamine  100 mg Oral Daily   Continuous Infusions: . cefTRIAXone (ROCEPHIN)   IV 2 g (09/05/17 1804)     LOS: 2 days    Time spent: 25 minutes  Greater than 50% of the time spent on counseling and coordinating the care.   Leisa Lenz, MD [facility] Pager [phone]  If 7PM-7AM, please contact night-coverage [email or web address] Password [facility] 09/06/2017, 12:52 PM

## 2017-09-06 NOTE — Progress Notes (Signed)
Progress Note   Subjective  Patient is doing much better today. Encephalopathy resolved, he feels much better. Has a good appetite. Tolerating lactulose as this point. Breathing better after thoracentesis. Wife and mother in room today.   Objective   Vital signs in last 24 hours: Temp:  [98.4 F (36.9 C)-98.8 F (37.1 C)] 98.4 F (36.9 C) (09/27 0529) Pulse Rate:  [77-89] 81 (09/27 0529) Resp:  [16-20] 18 (09/27 0529) BP: (84-120)/(47-67) 109/64 (09/27 0529) SpO2:  [94 %-97 %] 94 % (09/27 0529) Weight:  [164 lb 3.9 oz (74.5 kg)] 164 lb 3.9 oz (74.5 kg) (09/27 0529) Last BM Date: 09/05/17 General:    white male in NAD, jaundiced Heart:  Regular rate and rhythm;  Lungs: Respirations even and unlabored, decreased BS at R lower half Abdomen:  Soft, nontender, soft - ascites present but not tight.  Extremities:  Edema in LE Neurologic:  Alert and oriented,  grossly normal neurologically. No asterixis Psych:  Cooperative. Normal mood and affect.  Intake/Output from previous day: 09/26 0701 - 09/27 0700 In: 482.5 [P.O.:100; Blood:382.5] Out: 200 [Urine:200] Intake/Output this shift: No intake/output data recorded.  Lab Results:  Recent Labs  09/04/17 1802 09/05/17 0611 09/06/17 0526  WBC 6.8 6.0 5.3  HGB 8.1* 6.9* 8.0*  HCT 23.3* 19.4* 23.2*  PLT 86* 62* 61*   BMET  Recent Labs  09/04/17 1802 09/05/17 0611 09/06/17 0526  NA 132* 134* 138  K 4.8 4.8 4.6  CL 98* 100* 105  CO2 23 26 26   GLUCOSE 112* 89 96  BUN 31* 30* 27*  CREATININE 1.84* 1.64* 1.34*  CALCIUM 9.9 9.4 9.4   LFT  Recent Labs  09/05/17 0611  PROT 5.4*  ALBUMIN 2.4*  AST 58*  ALT 30  ALKPHOS 81  BILITOT 8.3*   PT/INR  Recent Labs  09/04/17 1921 09/05/17 0611  LABPROT 25.4* 27.3*  INR 2.34 2.56    Studies/Results: Dg Chest 1 View  Result Date: 09/05/2017 CLINICAL DATA:  Post thoracentesis EXAM: CHEST 1 VIEW COMPARISON:  09/04/2017, 08/24/2017 FINDINGS: Decreased right  pleural effusion post thoracentesis. No pneumothorax. Moderate residual effusion noted. Suspect tiny left effusion. Low lung volumes. Enlarged cardiomediastinal silhouette with central vascular congestion. Atelectasis or infiltrate at the right middle lobe and right lung base. IMPRESSION: 1. Decreased right pleural effusion post thoracentesis with no pneumothorax. Moderate residual right pleural effusion noted 2. Consolidation in the right middle lobe and right lung base which may reflect atelectasis or pneumonia 3. Cardiomegaly with central vascular congestion. Electronically Signed   By: Donavan Foil M.D.   On: 09/05/2017 15:59   Dg Abd Acute W/chest  Result Date: 09/04/2017 CLINICAL DATA:  Initial evaluation for acute cough, nausea and vomiting. EXAM: DG ABDOMEN ACUTE W/ 1V CHEST COMPARISON:  Prior radiograph from 08/24/2017. FINDINGS: Mild cardiomegaly, stable. Mediastinal silhouette within normal limits. There has been reaccumulation of a right pleural effusion, now large in size, extending superiorly over the right lung apex. Associated volume loss/atelectasis within the right lung. Left lung is grossly clear. Underlying pulmonary vascular congestion without frank pulmonary edema. No pneumothorax. Visualized bowel gas pattern within normal limits without evidence for obstruction or ileus. No appreciable abnormal bowel wall thickening. No free air seen on decubitus view. Calcified gallstone overlies the right upper quadrant. Additional calcific density overlying the left sacrum of uncertain etiology, but may reflect a calcified diverticulum. No acute osseous abnormality. IMPRESSION: 1. Re-accumulation of a large right pleural effusion with associated  right lung volume loss. 2. Underlying pulmonary vascular congestion without frank pulmonary edema. 3. Nonobstructive bowel gas pattern. No radiographic evidence for acute intra-abdominal pathology. 4. Cholelithiasis. Electronically Signed   By: Jeannine Boga M.D.   On: 09/04/2017 22:36   US Thoracentesis Asp Pleural Space W/img Guide  Result Date: 09/05/2017 INDICATION: Cirrhosis, encephalopathy, hepatic hydrothorax, renal insufficiency; request made for diagnostic and therapeutic right thoracentesis. EXAM: ULTRASOUND GUIDED DIAGNOSTIC AND THERAPEUTIC RIGHT THORACENTESIS MEDICATIONS: None. COMPLICATIONS: None immediate. PROCEDURE: An ultrasound guided thoracentesis was thoroughly discussed with the patient and questions answered. The benefits, risks, alternatives and complications were also discussed. The patient understands and wishes to proceed with the procedure. Written consent was obtained. Ultrasound was performed to localize and [place] an adequate pocket of fluid in the right chest. The area was then prepped and draped in the normal sterile fashion. 2% Lidocaine was used for local anesthesia. Under ultrasound guidance a Safe-T-Centesis catheter was introduced. Thoracentesis was performed. The catheter was removed and a dressing applied. FINDINGS: A total of approximately 1.1 liters of hazy, amber fluid was removed. Samples were sent to the laboratory as requested by the clinical team. Due to onset of patient coughing and hypotension only the above amount of fluid was removed today. IMPRESSION: Successful ultrasound guided diagnostic and therapeutic right thoracentesis yielding 1.1 liters of pleural fluid. Read by: Rowe Robert, PA-C Electronically Signed   By: Jacqulynn Cadet M.D.   On: 09/05/2017 16:08       Assessment / Plan:   56 y/o male with severely decompensated alcoholic cirrhosis - admitted with worsening encephalopathy, renal insufficiency, and noted to have recurrent R sided effusion is consistent with hepatic hydrothorax. Mother and wife in room today - wife states he was not taking lactulose during recent trip to [place] which could have precipitated this, along with renal injury, hyponatremia, perhaps from overdiuresis.    Doing much better over the past 24 hours. Renal function improving with cessation of diuretics and 1 dose of albumin. Hyponatremia resolved. Breathing better s/p thoracentesis. Post thoracentesis CXR shows ? atelectasis versus pneumonia - I suspect more likely atelectasis given lack of leukocytosis but will discuss with primary regarding possible antibiotic course. Pleural fluid was not infected. Hgb has risen appropriately following 1 unit PRBC. Will continue to hold diuretics for now, await renal function to return more to baseline (Cr of 9-1.0). Managing hepatic hydrothorax in this setting moving forward is going to be difficult, as he is not a good TIPS candidate. Ultimately is in need of transplant and he is going through the process to prepare for this as outpatient. He is receiving fairly regular paracentesis every few weeks and should continue to have these scheduled. If renal function normalizes tomorrow we can consider resuming low doses of diuretics.   Recommend: - resume low Na diet today - continue oral lactulose TID, goal at least 3 loose stools per day - monitor renal function, consider resumption of low dose diuretics in the next 1-2 days - continue antibiotics - consider adding zinc supplement for history of encephalopathy (has allergy reported to Rifaximin)  Call with questions, we will continue to follow.  Maharishi [place] Cellar, MD [facility] Pager [phone]

## 2017-09-07 ENCOUNTER — Other Ambulatory Visit: Payer: Self-pay

## 2017-09-07 DIAGNOSIS — K7469 Other cirrhosis of liver: Secondary | ICD-10-CM

## 2017-09-07 LAB — BASIC METABOLIC PANEL
ANION GAP: 7 (ref 5–15)
BUN: 22 mg/dL — ABNORMAL HIGH (ref 6–20)
CALCIUM: 8.9 mg/dL (ref 8.9–10.3)
CHLORIDE: 102 mmol/L (ref 101–111)
CO2: 25 mmol/L (ref 22–32)
CREATININE: 1.44 mg/dL — AB (ref 0.61–1.24)
GFR calc Af Amer: >60 mL/min (ref >60)
GFR calc non Af Amer: 53 mL/min — ABNORMAL LOW (ref >60)
GLUCOSE: 149 mg/dL — AB (ref 65–99)
Potassium: 3.7 mmol/L (ref 3.5–5.1)
Sodium: 134 mmol/L — ABNORMAL LOW (ref 135–145)

## 2017-09-07 MED ORDER — BENZONATATE 100 MG PO CAPS: 100.0000 mg | ORAL_CAPSULE | Freq: Two times a day (BID) | ORAL | 0 refills | Status: DC

## 2017-09-07 NOTE — Progress Notes (Signed)
hepatic 

## 2017-09-07 NOTE — Progress Notes (Addendum)
Progress Note   Subjective  Patient reports feeling well today. Mother and wife state no obvious encephalopathy, he appears clear minded. No abdominal pain. No oxygen requirement.    Objective   Vital signs in last 24 hours: Temp:  [98.3 F (36.8 C)-98.6 F (37 C)] 98.6 F (37 C) (09/28 0502) Pulse Rate:  [80-84] 81 (09/28 0502) Resp:  [14-16] 16 (09/28 0502) BP: (105-107)/(59-76) 105/59 (09/28 0502) SpO2:  [97 %-99 %] 98 % (09/28 0502) Weight:  [171 lb 15.3 oz (78 kg)] 171 lb 15.3 oz (78 kg) (09/28 0502) Last BM Date: 09/05/17 General:    white male in NAD, jaundiced Heart:  Regular rate and rhythm; no murmurs Lungs: Respirations even and unlabored, decreased BS at R base Abdomen:  Soft, nontender, mildly distended.  Extremities:  LE edema. Neurologic:  Alert and oriented,  grossly normal neurologically. No asterixis Psych:  Cooperative. Normal mood and affect.  Intake/Output from previous day: 09/27 0701 - 09/28 0700 In: 700 [P.O.:700] Out: -  Intake/Output this shift: No intake/output data recorded.  Lab Results:  Recent Labs  09/04/17 1802 09/05/17 0611 09/06/17 0526  WBC 6.8 6.0 5.3  HGB 8.1* 6.9* 8.0*  HCT 23.3* 19.4* 23.2*  PLT 86* 62* 61*   BMET  Recent Labs  09/04/17 1802 09/05/17 0611 09/06/17 0526  NA 132* 134* 138  K 4.8 4.8 4.6  CL 98* 100* 105  CO2 23 26 26   GLUCOSE 112* 89 96  BUN 31* 30* 27*  CREATININE 1.84* 1.64* 1.34*  CALCIUM 9.9 9.4 9.4   LFT  Recent Labs  09/05/17 0611  PROT 5.4*  ALBUMIN 2.4*  AST 58*  ALT 30  ALKPHOS 81  BILITOT 8.3*   PT/INR  Recent Labs  09/04/17 1921 09/05/17 0611  LABPROT 25.4* 27.3*  INR 2.34 2.56    Studies/Results: Dg Chest 1 View  Result Date: 09/05/2017 CLINICAL DATA:  Post thoracentesis EXAM: CHEST 1 VIEW COMPARISON:  09/04/2017, 08/24/2017 FINDINGS: Decreased right pleural effusion post thoracentesis. No pneumothorax. Moderate residual effusion noted. Suspect tiny left  effusion. Low lung volumes. Enlarged cardiomediastinal silhouette with central vascular congestion. Atelectasis or infiltrate at the right middle lobe and right lung base. IMPRESSION: 1. Decreased right pleural effusion post thoracentesis with no pneumothorax. Moderate residual right pleural effusion noted 2. Consolidation in the right middle lobe and right lung base which may reflect atelectasis or pneumonia 3. Cardiomegaly with central vascular congestion. Electronically Signed   By: Donavan Foil M.D.   On: 09/05/2017 15:59   US Thoracentesis Asp Pleural Space W/img Guide  Result Date: 09/05/2017 INDICATION: Cirrhosis, encephalopathy, hepatic hydrothorax, renal insufficiency; request made for diagnostic and therapeutic right thoracentesis. EXAM: ULTRASOUND GUIDED DIAGNOSTIC AND THERAPEUTIC RIGHT THORACENTESIS MEDICATIONS: None. COMPLICATIONS: None immediate. PROCEDURE: An ultrasound guided thoracentesis was thoroughly discussed with the patient and questions answered. The benefits, risks, alternatives and complications were also discussed. The patient understands and wishes to proceed with the procedure. Written consent was obtained. Ultrasound was performed to localize and [place] an adequate pocket of fluid in the right chest. The area was then prepped and draped in the normal sterile fashion. 2% Lidocaine was used for local anesthesia. Under ultrasound guidance a Safe-T-Centesis catheter was introduced. Thoracentesis was performed. The catheter was removed and a dressing applied. FINDINGS: A total of approximately 1.1 liters of hazy, amber fluid was removed. Samples were sent to the laboratory as requested by the clinical team. Due to onset of patient coughing and  hypotension only the above amount of fluid was removed today. IMPRESSION: Successful ultrasound guided diagnostic and therapeutic right thoracentesis yielding 1.1 liters of pleural fluid. Read by: Rowe Robert, PA-C Electronically Signed   By: Jacqulynn Cadet M.D.   On: 09/05/2017 16:08       Assessment / Plan:   56 y/o male with severely decompensated alcoholic cirrhosis - admitted with worsening encephalopathy, renal insufficiency, and noted to have recurrent R sided effusion is consistent with hepatic hydrothorax, not infected. I suspect noncompliance with lactulose precipitated encephalopathy, along with renal injury / hyponatremia, perhaps from overdiuresis.   Doing much better over the past 48 hours. He wants to go home today which I think is reasonable today. Will await renal function labs to ensure it continues to improve. If it does, may resume a much low doser of diuretics and recheck BMET on Monday as outpatient. I explained with the patient / family that he has needs for high dose diuretics due to ascites/ hepatic hydrothorax, but this has worsened his renal function. We will have monitor renal function closely as outpatient. Unfortunately he is not a good TIPS candidate. He is due to follow up with his Hepatologist in the next few weeks as he continues evaluation for transplant.   Recommend: - continue low Na diet today - continue oral lactulose TID, goal at least 3 loose stools per day - await labs from this AM - BMET - if Cr normalizes, would start back aldactone 50mg  once daily and lasix 20mg  once daily. Repeat BMET on Monday. We will coordinate this  - resume ciprofloxacin as outpatient for SBP prophylaxis - consider adding zinc supplement for history of encephalopathy (has allergy reported to Rifaximin). Will discuss with Dr. Hilarie Fredrickson as outpatient  Call with questions.  [place] Cellar, MD [facility] Pager [phone]   UPDATE: Cr slightly higher than previous, I would continue to hold diuretics for now. Repeat BMET on Monday, consider resuming low dose at that time if tolerated. Otherwise okay to discharge.

## 2017-09-07 NOTE — Discharge Instructions (Signed)
Hepatic Encephalopathy °Hepatic encephalopathy is a loss of brain function from advanced liver disease. The effects of the condition depend on the type of liver damage and how severe it is. In some cases, hepatic encephalopathy can be reversed. °What are the causes? °The exact cause of hepatic encephalopathy is not known. °What increases the risk? °You have a higher risk of getting this condition if your liver is damaged. When the liver is damaged harmful substances called toxins can build up in the body. Certain toxins, such as ammonia, can harm your brain. Conditions that can cause liver damage include: °· An infection. °· Dehydration. °· Intestinal bleeding. °· Drinking too much alcohol. °· Taking certain medicines, including tranquilizers, water pills (diuretics), antidepressants, or sleeping pills. ° °What are the signs or symptoms? °Signs and symptoms may develop suddenly. Or, they may develop slowly and get worse gradually. Symptoms can range from mild to severe. °Mild Hepatic Encephalopathy °· Mild confusion. °· Personality and mood changes. °· Anxiety and agitation. °· Drowsiness. °· Loss of mental abilities. °· Musty or sweet-smelling breath. °Worsening or Severe Hepatic Encephalopathy °· Slowed movement. °· Slurred speech. °· Extreme personality changes. °· Disorientation. °· Abnormal shaking or flapping of the hands. °· Coma. °How is this diagnosed? °To make a diagnosis, your health care provider will do a physical exam. To rule out other causes of your signs and symptoms, he or she may order tests. You may have: °· Blood tests. These may be done to check your ammonia level, measure how long it takes your blood to clot, and check for infection. °· Liver function tests. These may be done to check how well your liver is working. °· MRI and CT scans. These may be done to check for a brain disorder. °· Electroencephalogram (EEG). This may be done to measure the electrical activity in your brain. ° °How is  this treated? °The first step in treatment is identifying and treating possible triggers. The next step is involves taking medicine to lower the level of toxins in the body and to prevent ammonia from building up. You may need to take: °· Antibiotics to reduce the ammonia-producing bacteria in your gut. °· Lactulose to help flush ammonia from the gut. ° °Follow these instructions at home: °Eating and drinking °· Follow a low-protein diet that includes plenty of fruits, vegetables, and whole grains, as directed by your health care provider. Ammonia is produced when you digest high-protein foods. °· Work with a dietitian or with your health care provider to make sure you are getting the right balance of protein and minerals. °· Drink enough fluids to keep your urine clear or pale yellow. Drinking plenty of water helps prevent constipation. °· Do not drink alcohol or use illegal drugs. °Medicines °· Only take medicine as directed by your health care provider. °· If you were prescribed an antibiotic medicine, finish it all even if you start to feel better. °· Do not start any new medicines, including over-the-counter medicines, without first checking with your health care provider. °Contact a health care provider if: °· You have new symptoms. °· Your symptoms change. °· Your symptoms get worse. °· You have a fever. °· You are constipated. °· You have persistent nausea, vomiting, or diarrhea. °Get help right away if: °· You become very confused or drowsy. °· You vomit blood or material that looks like coffee grounds. °· Your stool is bloody or black or looks like tar. °This information is not intended to replace advice given to   you by your health care provider. Make sure you discuss any questions you have with your health care provider. °Document Released: 02/06/2007 Document Revised: 05/04/2016 Document Reviewed: 07/15/2014 °Elsevier Interactive Patient Education © 2018 [employer] ° °

## 2017-09-07 NOTE — Discharge Summary (Signed)
Physician Discharge Summary  Thomas [name] YKD:[id number] DOB: 05/02/1961 DOA: 09/04/2017  PCP: Alroy Dust, L.Marlou Sa, MD  Admit date: 09/04/2017 Discharge date: 09/07/2017  Recommendations for Outpatient Follow-up:  1. Follow-up with primary care physician in one to 2 weeks to make sure symptoms are stable 2. Also follow-up on creatinine 3. Lasix and Aldactone on hold due to renal insufficiency  Discharge Diagnoses:  Active Problems:   Acute hepatic encephalopathy    Discharge Condition: stable   Diet recommendation: as tolerated   History of present illness:  56 year old male with end-stage liver disease, following up lipid U transplant, not on the list, recurrent hepatic encephalopathy, chronic blood loss anemia, recurrent SBP on Cipro prophylaxis. Patient presented to ED with complaints of nausea and vomiting and confusion. He is being treated for acute hepatic encephalopathy and was also found to have acute kidney injury. Last paracentesis was 08/25/2017 with 2.8 L of fluid removed. Patient had right thoracentesis 08/24/2017 with 1.7 L fluid removed.  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 - As noted above, has had thoracentesis 08/24/2017 yielding 1.7 L fluid - Thoracentesis 9/26 with 1.1 L fluid removed     Chronic kidney disease stage III - Creatinine in April 2018 was 2.42 - Cr 1.4 this am so  holding lasix and aldactone        DVT prophylaxis: SCD's Code Status: full code  Family Communication: wife at bedside    Consultants:   GI  PT  Procedures:   Thoracentesis 9/26 - 1.1 L removed  Antimicrobials:   Rocephin through 9/28  Cipro on discharge    Signed:  Leisa Lenz, MD  [facility] 09/07/2017, 12:49 PM  Pager #: [phone]  Time spent in minutes: more than 30 minutes  Discharge Exam: Vitals:   09/06/17 2209 09/07/17 0502  BP: 107/60 (!) 105/59  Pulse: 84 81  Resp: 16 16  Temp: 98.3 F (36.8 C) 98.6 F (37 C)  SpO2: 97% 98%   Vitals:   09/06/17 0529 09/06/17 1600 09/06/17 2209 09/07/17 0502  BP: 109/64 105/76 107/60 (!) 105/59  Pulse: 81 80 84 81  Resp: 18 14 16 16   Temp: 98.4 F (36.9 C) 98.4 F (36.9 C) 98.3 F (36.8 C) 98.6 F (37 C)  TempSrc: Oral Oral Oral Oral  SpO2: 94% 99% 97% 98%  Weight: 74.5 kg (164 lb 3.9 oz)   78 kg (171 lb 15.3 oz)  Height:        General: Pt is alert, follows commands appropriately, not in acute distress Cardiovascular: Regular rate and rhythm, S1/S2 +, no murmurs Respiratory: Clear to auscultation bilaterally, no wheezing, no crackles, no rhonchi Abdominal: Soft, non tender, non distended, bowel sounds +, no guarding Extremities: no edema, no cyanosis, pulses palpable bilaterally DP and PT Neuro: Grossly nonfocal  Discharge Instructions  Discharge Instructions    Call MD for:  persistant nausea and vomiting  Complete by:  As directed    Call MD for:  redness, tenderness, or signs of infection (pain, swelling, redness, odor or green/yellow discharge around incision site)    Complete by:  As directed    Call MD for:  severe uncontrolled pain    Complete by:  As directed    Diet - low sodium heart healthy    Complete by:  As directed    Discharge instructions    Complete by:  As directed    Hold Lasix and Aldactone per GI recommendations due to rising creatinine. Creatinine  is 1.44 prior to discharge and will need to be followed on outpatient basis.   Increase activity slowly    Complete by:  As directed      Allergies as of 09/07/2017      Reactions   Rifaximin Swelling, Other (See Comments)   Reaction:  All over body swelling       Medication List    STOP taking these medications   furosemide 20 MG tablet Commonly known as:  LASIX   spironolactone 100 MG tablet Commonly known as:  ALDACTONE     TAKE these medications   benzonatate 100 MG capsule Commonly known as:  TESSALON Take 1 capsule (100 mg total) by mouth 2 (two) times daily.   bismuth subsalicylate 614 MG chewable tablet Commonly known as:  PEPTO BISMOL Chew 262-524 mg by mouth 3 (three) times daily as needed for indigestion.   calcium carbonate 750 MG chewable tablet Commonly known as:  TUMS EX Chew 1-2 tablets by mouth 3 (three) times daily as needed for heartburn.   calcium-vitamin D 500-200 MG-UNIT tablet Commonly known as:  OSCAL WITH D Take 1 tablet by mouth 2 (two) times daily.   ciprofloxacin 500 MG tablet Commonly known as:  CIPRO TAKE 1 TABLET BY MOUTH EVERY EVENING   ergocalciferol 50000 units capsule Commonly known as:  VITAMIN D2 Take 50,000 Units by mouth once a week.   ferrous sulfate 325 (65 FE) MG tablet Take 1 tablet (325 mg total) by mouth 3 (three) times daily with meals. What changed:  when to take this   folic acid 1 MG tablet Commonly known as:  FOLVITE TAKE 1 TABLET BY MOUTH AT BEDTIME   lactulose 20 g packet Commonly known as:  CEPHULAC Take 1 packet (20 g total) by mouth 3 (three) times daily.   magnesium oxide 400 (241.3 Mg) MG tablet Commonly known as:  MAG-OX Take 0.5 tablets (200 mg total) by mouth 2 (two) times daily.   pantoprazole 40 MG tablet Commonly known as:  PROTONIX TAKE 1 TABLET (40 MG TOTAL) BY MOUTH 2 (TWO) TIMES DAILY.   sucralfate 1 g tablet Commonly known as:  CARAFATE TAKE 1 TABLET BY MOUTH 4 TIMES DAILY    thiamine 100 MG tablet Take 1 tablet (100 mg total) by mouth daily.   TUSSIN COUGH PO Take 2 capsules by mouth at bedtime.            Discharge Care Instructions        Start     Ordered   09/07/17 0000  benzonatate (TESSALON) 100 MG capsule  2 times daily     09/07/17 1248   09/07/17 0000  Increase activity slowly     09/07/17 1248   09/07/17 0000  Diet - low sodium heart healthy     09/07/17 1248   09/07/17 0000  Discharge instructions    Comments:  Hold Lasix and  Aldactone per GI recommendations due to rising creatinine. Creatinine is 1.44 prior to discharge and will need to be followed on outpatient basis.   09/07/17 1248   09/07/17 0000  Call MD for:  persistant nausea and vomiting     09/07/17 1248   09/07/17 0000  Call MD for:  severe uncontrolled pain     09/07/17 1248   09/07/17 0000  Call MD for:  redness, tenderness, or signs of infection (pain, swelling, redness, odor or green/yellow discharge around incision site)     09/07/17 1248     Follow-up Information    Alroy Dust, L.Marlou Sa, MD. Schedule an appointment as soon as possible for a visit.   Specialty:  Family Medicine Contact information: [address] [place] [address] [phone]            The results of significant diagnostics from this hospitalization (including imaging, microbiology, ancillary and laboratory) are listed below for reference.    Significant Diagnostic Studies: Dg Chest 1 View  Result Date: 09/05/2017 CLINICAL DATA:  Post thoracentesis EXAM: CHEST 1 VIEW COMPARISON:  09/04/2017, 08/24/2017 FINDINGS: Decreased right pleural effusion post thoracentesis. No pneumothorax. Moderate residual effusion noted. Suspect tiny left effusion. Low lung volumes. Enlarged cardiomediastinal silhouette with central vascular congestion. Atelectasis or infiltrate at the right middle lobe and right lung base. IMPRESSION: 1. Decreased right pleural effusion post thoracentesis with no  pneumothorax. Moderate residual right pleural effusion noted 2. Consolidation in the right middle lobe and right lung base which may reflect atelectasis or pneumonia 3. Cardiomegaly with central vascular congestion. Electronically Signed   By: Donavan Foil M.D.   On: 09/05/2017 15:59   Dg Chest 1 View  Result Date: 08/24/2017 CLINICAL DATA:  Status post right thoracentesis. EXAM: CHEST 1 VIEW COMPARISON:  08/23/2017 FINDINGS: The heart size and mediastinal contours are within normal limits. Significantly less right pleural fluid after thoracentesis. No pneumothorax. No edema. The heart size and mediastinal contours are within normal limits. The visualized skeletal structures are unremarkable. IMPRESSION: Decrease in right pleural fluid after thoracentesis. No pneumothorax. Electronically Signed   By: Aletta Edouard M.D.   On: 08/24/2017 17:43   Dg Chest 2 View  Result Date: 08/23/2017 CLINICAL DATA:  Cirrhosis.  Confusion. EXAM: CHEST  2 VIEW COMPARISON:  06/28/2017.  06/14/2017.  03/27/2017. FINDINGS: Mediastinum and hilar structures normal. Cardiomegaly with normal pulmonary vascularity . Atelectasis right lower lobe. Prominent right pleural effusion again note. Left lung clear. No acute bony abnormality. IMPRESSION: 1. Right lower lobe atelectasis and consolidation. Prominent right pleural effusion again noted. 2. Cardiomegaly. Electronically Signed   By: Marcello Moores  Register   On: 08/23/2017 12:48   US Paracentesis  Result Date: 08/25/2017 INDICATION: Alcoholic cirrhosis, ascites. Request for diagnostic and therapeutic right paracentesis. EXAM: ULTRASOUND GUIDED LEFT LATERAL ABDOMEN PARACENTESIS MEDICATIONS: 1% Lidocaine with epinephrine = 10 mL COMPLICATIONS: None immediate. PROCEDURE: Informed written consent was obtained from the patient after a discussion of the risks, benefits and alternatives to treatment. A timeout was performed prior to the initiation of the procedure. Initial ultrasound  scanning demonstrates a moderate amount of ascites within the left lateral abdomen. The left lateral abdomen was prepped and draped in the usual sterile fashion. 1% lidocaine with epinephrine was used for local anesthesia. Following this, a 19 gauge, 7-cm, Yueh catheter was introduced. An ultrasound image was saved for documentation purposes. The paracentesis was performed. The catheter was removed and a dressing was applied. The patient tolerated the procedure well  without immediate post procedural complication. FINDINGS: A total of approximately 2.8 liters of clear yellow fluid was removed. Samples were sent to the laboratory as requested by the clinical team. IMPRESSION: Successful ultrasound-guided paracentesis yielding 2.8 liters of peritoneal fluid. Read by:  Gareth Eagle, PA-C Electronically Signed   By: Corrie Mckusick D.O.   On: 08/25/2017 13:22   Dg Abd Acute W/chest  Result Date: 09/04/2017 CLINICAL DATA:  Initial evaluation for acute cough, nausea and vomiting. EXAM: DG ABDOMEN ACUTE W/ 1V CHEST COMPARISON:  Prior radiograph from 08/24/2017. FINDINGS: Mild cardiomegaly, stable. Mediastinal silhouette within normal limits. There has been reaccumulation of a right pleural effusion, now large in size, extending superiorly over the right lung apex. Associated volume loss/atelectasis within the right lung. Left lung is grossly clear. Underlying pulmonary vascular congestion without frank pulmonary edema. No pneumothorax. Visualized bowel gas pattern within normal limits without evidence for obstruction or ileus. No appreciable abnormal bowel wall thickening. No free air seen on decubitus view. Calcified gallstone overlies the right upper quadrant. Additional calcific density overlying the left sacrum of uncertain etiology, but may reflect a calcified diverticulum. No acute osseous abnormality. IMPRESSION: 1. Re-accumulation of a large right pleural effusion with associated right lung volume loss. 2.  Underlying pulmonary vascular congestion without frank pulmonary edema. 3. Nonobstructive bowel gas pattern. No radiographic evidence for acute intra-abdominal pathology. 4. Cholelithiasis. Electronically Signed   By: Jeannine Boga M.D.   On: 09/04/2017 22:36   Ir Paracentesis  Result Date: 08/16/2017 INDICATION: Alcoholic cirrhosis, ascites. Request for diagnostic and therapeutic right paracentesis. EXAM: ULTRASOUND GUIDED RIGHT LATERAL ABDOMEN PARACENTESIS MEDICATIONS: 1% Lidocaine = 10 mL COMPLICATIONS: None immediate. PROCEDURE: Informed written consent was obtained from the patient after a discussion of the risks, benefits and alternatives to treatment. A timeout was performed prior to the initiation of the procedure. Initial ultrasound scanning demonstrates a small amount of ascites within the right lateral abdomen. The right lateral abdomen was prepped and draped in the usual sterile fashion. 1% lidocaine with epinephrine was used for local anesthesia. Following this, a 19 gauge, 7-cm, Yueh catheter was introduced. An ultrasound image was saved for documentation purposes. The paracentesis was performed. The catheter was removed and a dressing was applied. The patient tolerated the procedure well without immediate post procedural complication. FINDINGS: A total of approximately 2.6 liters of clear yellow fluid was removed. Samples were sent to the laboratory as requested by the clinical team. IMPRESSION: Successful ultrasound-guided paracentesis yielding 2.6 liters of peritoneal fluid. Read by:  Gareth Eagle, PA-C Electronically Signed   By: Jacqulynn Cadet M.D.   On: 08/16/2017 10:38   US Thoracentesis Asp Pleural Space W/img Guide  Result Date: 09/05/2017 INDICATION: Cirrhosis, encephalopathy, hepatic hydrothorax, renal insufficiency; request made for diagnostic and therapeutic right thoracentesis. EXAM: ULTRASOUND GUIDED DIAGNOSTIC AND THERAPEUTIC RIGHT THORACENTESIS MEDICATIONS: None.  COMPLICATIONS: None immediate. PROCEDURE: An ultrasound guided thoracentesis was thoroughly discussed with the patient and questions answered. The benefits, risks, alternatives and complications were also discussed. The patient understands and wishes to proceed with the procedure. Written consent was obtained. Ultrasound was performed to localize and Jencarlo an adequate pocket of fluid in the right chest. The area was then prepped and draped in the normal sterile fashion. 2% Lidocaine was used for local anesthesia. Under ultrasound guidance a Safe-T-Centesis catheter was introduced. Thoracentesis was performed. The catheter was removed and a dressing applied. FINDINGS: A total of approximately 1.1 liters of hazy, amber fluid was removed. Samples were sent to  the laboratory as requested by the clinical team. Due to onset of patient coughing and hypotension only the above amount of fluid was removed today. IMPRESSION: Successful ultrasound guided diagnostic and therapeutic right thoracentesis yielding 1.1 liters of pleural fluid. Read by: Rowe Robert, PA-C Electronically Signed   By: Jacqulynn Cadet M.D.   On: 09/05/2017 16:08   US Thoracentesis Asp Pleural Space W/img Guide  Result Date: 08/24/2017 INDICATION: 56 year old male with a right-sided hepatic hydrothorax. EXAM: ULTRASOUND GUIDED RIGHT THORACENTESIS MEDICATIONS: None. COMPLICATIONS: None immediate. PROCEDURE: An ultrasound guided thoracentesis was thoroughly discussed with the patient and questions answered. The benefits, risks, alternatives and complications were also discussed. The patient understands and wishes to proceed with the procedure. Written consent was obtained. Ultrasound was performed to localize and Khaidyn an adequate pocket of fluid in the right chest. The area was then prepped and draped in the normal sterile fashion. 1% Lidocaine was used for local anesthesia. Under ultrasound guidance a 19 gauge, 7-cm, Yueh catheter was introduced.  Thoracentesis was performed. The catheter was removed and a dressing applied. FINDINGS: A total of approximately 800 mL of hazy amber colored fluid was removed. Samples were sent to the laboratory as requested by the clinical team. IMPRESSION: Successful ultrasound guided right thoracentesis yielding 800 mL of pleural fluid. Electronically Signed   By: Jacqulynn Cadet M.D.   On: 08/24/2017 17:40    Microbiology: Recent Results (from the past 240 hour(s))  Culture, body fluid-bottle     Status: None (Preliminary result)   Collection Time: 09/05/17  3:12 PM  Result Value Ref Range Status   Specimen Description FLUID RIGHT PLEURAL  Final   Special Requests BOTTLES DRAWN AEROBIC AND ANAEROBIC 10CC  Final   Culture   Final    NO GROWTH 2 DAYS Performed at [facility], [address] [place], [place] [address]    Report Status PENDING  Incomplete  Gram stain     Status: None   Collection Time: 09/05/17  3:12 PM  Result Value Ref Range Status   Specimen Description FLUID RIGHT PLEURAL  Final   Special Requests NONE  Final   Gram Stain   Final    ABUNDANT WBC PRESENT, PREDOMINANTLY MONONUCLEAR NO ORGANISMS SEEN Performed at [facility], [place] [address], [place], [place] [address]    Report Status 09/05/2017 FINAL  Final     Labs: Basic Metabolic Panel:  Recent Labs Lab 09/03/17 1524 09/04/17 1802 09/05/17 0611 09/06/17 0526 09/07/17 1101  NA 127* 132* 134* 138 134*  K 4.8 4.8 4.8 4.6 3.7  CL 96 98* 100* 105 102  CO2 25 23 26 26 25   GLUCOSE 159* 112* 89 96 149*  BUN 34* 31* 30* 27* 22*  CREATININE 2.03* 1.84* 1.64* 1.34* 1.44*  CALCIUM 9.0 9.9 9.4 9.4 8.9  MG  --  2.2  --   --   --   PHOS  --  4.8*  --   --   --    Liver Function Tests:  Recent Labs Lab 09/03/17 1524 09/04/17 1802 09/05/17 0611  AST 62* 79* 58*  ALT 29 42 30  ALKPHOS 93 116 81  BILITOT 8.8* 9.3* 8.3*  PROT 5.2* 6.3* 5.4*  ALBUMIN 2.2* 2.6* 2.4*   No results for input(s): LIPASE,  AMYLASE in the last 168 hours.  Recent Labs Lab 09/04/17 1920 09/06/17 0526  AMMONIA 86* 33   CBC:  Recent Labs Lab 09/03/17 1526 09/04/17 1802 09/05/17 4696  09/06/17 0526  WBC 5.7 6.8 6.0 5.3  NEUTROABS 4.2 4.7  --   --   HGB 8.1 aL* 8.1* 6.9* 8.0*  HCT 23.2 cL* 23.3* 19.4* 23.2*  MCV 103.2* 97.5 98.0 97.5  PLT 93.0* 86* 62* 61*   Cardiac Enzymes: No results for input(s): CKTOTAL, CKMB, CKMBINDEX, TROPONINI in the last 168 hours. BNP: BNP (last 3 results)  Recent Labs  03/27/17 2051 06/13/17 1856  BNP 106.7* 79.3    ProBNP (last 3 results) No results for input(s): PROBNP in the last 8760 hours.  CBG: No results for input(s): GLUCAP in the last 168 hours.

## 2017-09-07 NOTE — Care Management Note (Signed)
Case Management Note  Patient Details  Name: Thomas [name] MRN: [id number] Date of Birth: [birth year]  Subjective/Objective:56 y/o m admitted w/Acute hepatic encephalopathy. No PT/OT f/u needs. No further CM needs.                    Action/Plan:d/c plan home.   Expected Discharge Date:  09/07/17               Expected Discharge Plan:  Home/Self Care  In-House Referral:     Discharge planning Services  CM Consult  Post Acute Care Choice:    Choice offered to:     DME Arranged:    DME Agency:     HH Arranged:    HH Agency:     Status of Service:  Completed, signed off  If discussed at [employer] of Stay Meetings, dates discussed:    Additional Comments:  Dessa Phi, RN 09/07/2017, 1:15 PM

## 2017-09-10 ENCOUNTER — Encounter: Payer: Self-pay | Admitting: Internal Medicine

## 2017-09-10 ENCOUNTER — Ambulatory Visit (INDEPENDENT_AMBULATORY_CARE_PROVIDER_SITE_OTHER): Payer: BLUE CROSS/BLUE SHIELD | Admitting: Internal Medicine

## 2017-09-10 ENCOUNTER — Other Ambulatory Visit (INDEPENDENT_AMBULATORY_CARE_PROVIDER_SITE_OTHER): Payer: BLUE CROSS/BLUE SHIELD

## 2017-09-10 ENCOUNTER — Ambulatory Visit: Payer: BLUE CROSS/BLUE SHIELD | Admitting: Internal Medicine

## 2017-09-10 VITALS — BP 110/60 | HR 84 | Ht 64.5 in | Wt 180.5 lb

## 2017-09-10 DIAGNOSIS — J948 Other specified pleural conditions: Secondary | ICD-10-CM | POA: Diagnosis not present

## 2017-09-10 DIAGNOSIS — K7290 Hepatic failure, unspecified without coma: Secondary | ICD-10-CM | POA: Diagnosis not present

## 2017-09-10 DIAGNOSIS — N289 Disorder of kidney and ureter, unspecified: Secondary | ICD-10-CM

## 2017-09-10 DIAGNOSIS — K7031 Alcoholic cirrhosis of liver with ascites: Secondary | ICD-10-CM | POA: Diagnosis not present

## 2017-09-10 DIAGNOSIS — K7682 Hepatic encephalopathy: Secondary | ICD-10-CM

## 2017-09-10 DIAGNOSIS — Z8619 Personal history of other infectious and parasitic diseases: Secondary | ICD-10-CM

## 2017-09-10 DIAGNOSIS — D689 Coagulation defect, unspecified: Secondary | ICD-10-CM

## 2017-09-10 DIAGNOSIS — K7469 Other cirrhosis of liver: Secondary | ICD-10-CM | POA: Diagnosis not present

## 2017-09-10 DIAGNOSIS — K766 Portal hypertension: Secondary | ICD-10-CM | POA: Diagnosis not present

## 2017-09-10 DIAGNOSIS — K3189 Other diseases of stomach and duodenum: Secondary | ICD-10-CM

## 2017-09-10 LAB — CULTURE, BODY FLUID-BOTTLE: CULTURE: NO GROWTH

## 2017-09-10 LAB — BASIC METABOLIC PANEL
BUN: 22 mg/dL (ref 6–23)
CALCIUM: 8.3 mg/dL — AB (ref 8.4–10.5)
CO2: 24 mEq/L (ref 19–32)
Chloride: 98 mEq/L (ref 96–112)
Creatinine, Ser: 1.5 mg/dL (ref 0.40–1.50)
GFR: 51.59 mL/min — AB (ref >60.00)
GLUCOSE: 142 mg/dL — AB (ref 70–99)
Potassium: 3.9 mEq/L (ref 3.5–5.1)
Sodium: 129 mEq/L — ABNORMAL LOW (ref 135–145)

## 2017-09-10 LAB — CBC WITH DIFFERENTIAL/PLATELET
BASOS ABS: 0 10*3/uL (ref 0.0–0.1)
Basophils Relative: 0.6 % (ref 0.0–3.0)
EOS ABS: 0.1 10*3/uL (ref 0.0–0.7)
EOS PCT: 1.9 % (ref 0.0–5.0)
HCT: 23.2 % — CL (ref 39.0–52.0)
LYMPHS ABS: 0.7 10*3/uL (ref 0.7–4.0)
Lymphocytes Relative: 11.2 % — ABNORMAL LOW (ref 12.0–46.0)
MCHC: 33.9 g/dL (ref 30.0–36.0)
MCV: 103.1 fl — ABNORMAL HIGH (ref 78.0–100.0)
MONO ABS: 0.8 10*3/uL (ref 0.1–1.0)
Monocytes Relative: 13.3 % — ABNORMAL HIGH (ref 3.0–12.0)
NEUTROS PCT: 73 % (ref 43.0–77.0)
Neutro Abs: 4.6 10*3/uL (ref 1.4–7.7)
Platelets: 66 10*3/uL — ABNORMAL LOW (ref 150.0–400.0)
RDW: 24.4 % — ABNORMAL HIGH (ref 11.5–15.5)
WBC: 6.3 10*3/uL (ref 4.0–10.5)

## 2017-09-10 LAB — HEPATIC FUNCTION PANEL
ALT: 31 U/L (ref 0–53)
AST: 57 U/L — AB (ref 0–37)
Albumin: 2.3 g/dL — ABNORMAL LOW (ref 3.5–5.2)
Alkaline Phosphatase: 103 U/L (ref 39–117)
BILIRUBIN DIRECT: 2 mg/dL — AB (ref 0.0–0.3)
BILIRUBIN TOTAL: 5.8 mg/dL — AB (ref 0.2–1.2)
TOTAL PROTEIN: 5.5 g/dL — AB (ref 6.0–8.3)

## 2017-09-10 LAB — CULTURE, BODY FLUID W GRAM STAIN -BOTTLE

## 2017-09-10 LAB — PROTIME-INR
INR: 2.5 ratio — AB (ref 0.8–1.0)
PROTHROMBIN TIME: 26.7 s — AB (ref 9.6–13.1)

## 2017-09-10 LAB — PATHOLOGIST SMEAR REVIEW

## 2017-09-10 NOTE — Progress Notes (Signed)
Subjective:    Patient ID: Thomas [name], male    DOB: 11-Apr-1961, 56 y.o.   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 I saw him in the office 6 days ago after which he was admitted to the hospital with hepatic encephalopathy. He was discharged on Friday, 3 days ago. During this hospitalization he was treated with lactulose oral an enema preparation, he had a thoracentesis with removal of 1 L of pleural fluid, diuretics were held with improvement but not normalization of his serum creatinine, 1 unit of packed red cells for recurrent anemia without overt bleeding. He is here today with his wife.  He is feeling much better than he was last week. His confusion has resolved and he has been using his lactulose twice daily. He's having 3-5 soft but formed stools per day. His stools have been slightly red in color but neither he nor his wife have seen overt blood, melena or hematochezia. He has been drinking orange and red vitamin water on a regular basis. He did have one episode of nonbloody emesis this morning after taking his medications on an empty stomach. This was happening some last week but has only happened once since discharge. He reports better appetite. He will have nausea if he takes his medications on an empty stomach. Denies abdominal pain but feels increasing abdominal tightness. Last paracentesis has been several weeks ago. He is back on Cipro prophylaxis. He has continued pantoprazole twice a day.  He has his 3 of 4 necessary visits with his psychologist for alcohol relapse prevention therapy this Thursday. His fourth session is scheduled for shortly thereafter. I did speak by phone with Dr. Ouida Sills, his psychologist last week to request that we expedite  these visits if at all possible given his severe liver disease and the need for transplant as soon as possible.  He has follow-up at [facility] with hepatology on 09/26/2017  He has remained committed to alcohol abstinence.  Review of Systems As per HPI, otherwise negative  Current Medications, Allergies, Past Medical History, Past Surgical History, Family History and Social History were reviewed in [employer] record.     Objective:   Physical Exam BP 110/60 (BP Location: Left Arm, Patient Position: Sitting, Cuff Size: Normal)   Pulse 84   Ht 5' 4.5" (1.638 m)   Wt 180 lb 8 oz (81.9 kg)   BMI 30.50 kg/m  Constitutional: Chronically ill-appearing male, jaundice, walking today, alert and oriented. HEENT: Normocephalic and atraumatic. Oropharynx is clear and moist. Conjunctivae are pale.  Icteric sclera Neck: Neck supple. Trachea midline. Cardiovascular: Normal rate, regular rhythm and intact distal pulses.  Pulmonary/chest: Effort normal and breath sounds normal. No wheezing, rales or rhonchi. Abdominal: Soft, nontender, distended but not tense, Bowel sounds active throughout. Extremities: no clubbing, cyanosis, 2-3+ LE edema Neurological: Alert and oriented to person place and time. No asterixis today Skin: Skin is warm and dry. Psychiatric: Normal mood and affect. Behavior is normal.     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   1. Decompensated alcoholic cirrhosis/ascites/hepatic hydrothorax/hepatic encephalopathy/acute on chronic renal insufficiency/alcohol abuse in remission --[name]  was hospitalized last week with decompensated cirrhosis, recurrent hepatic hydrothorax and severe hepatic encephalopathy. He also had acute renal injury.  His diuretics have been held since hospitalization the week before while he was traveling in [place]. --Difficult situation given severely decompensated liver disease. He needs liver transplant as soon as possible. He has several hurdles to clear before he can be listed. He does have hepatology follow-up in 2 weeks at [facility]. He will complete his 4 necessary psychology visits prior to hepatology follow-up. He also plans to see his dentist as he needs to dental caries filled prior to transplant and the resultant immunosuppression.  Awaiting labs from today to decide when to resume diuretics. Given his renal insufficiency resuming diuretics may be difficult if not impossible. Certainly we do not want to precipitate hepatorenal syndrome. We may have to rely on intermittent thoracentesis and paracentesis as needed and low sodium diet. I do not think he is a TIPS candidate given his current level of decompensation.  He's had recurrent anemia felt secondary to portal gastropathy. We are going to support with transfusions. He is on twice a day PPI. CBC pending from today.  Coagulopathy related to decompensated liver disease not helping his anemia.  I will have him continue lactulose twice daily, his hepatic encephalopathy has improved. He knows to the third dose if he fails to have a bowel movement or if he becomes sleepy/somnolent or confused. Both his wife and his mother are actively involved and help monitor for this.  Continue Cipro daily for SBP prophylaxis  We will set him up for paracentesis with IV albumin later this week on Thursday or Friday  Repeat CBC, CMP and INR later this week. Will likely need eventual repeat blood transfusion  I expect he will be in and out of the hospital up until transplant. We will do our best to see him frequently and try to keep him out of the hospital whenever possible.  40 minutes spent with the patient today. Greater than 50% was spent in counseling and  coordination of care with the patient

## 2017-09-10 NOTE — Patient Instructions (Addendum)
You have been scheduled for an abdominal paracentesis at [facility] radiology (1st floor of hospital) on Friday,09/14/17  at 1:00 pm. Please arrive at least 15 minutes prior to your appointment time for registration. Should you need to reschedule this appointment for any reason, please call our office at [phone]. You will have a CBC, CMP and INR completed with this appointment.  Stay off of your diuretics until we get your labs back from today and have contacted you.  Stay on a low sodium diet (2 grams or less per day).  Take your lactulose twice daily . If you have any confusion or do not have any bowel movements, please increase your dose to three times daily dosing.  Continue cipro.  Should you have any significant bleeding or confusion, return to the hospital!  Follow up with Nicoletta Ba, PA-C 09/20/17 @ 10:30 am  Follow up with Dr Derrek Monaco 09/26/17.  Keep appointment with Behavioral health.

## 2017-09-10 NOTE — Addendum Note (Signed)
Addended by: Kathlyn Sacramento on: 09/10/2017 02:31 PM   Modules accepted: Orders

## 2017-09-13 ENCOUNTER — Ambulatory Visit (INDEPENDENT_AMBULATORY_CARE_PROVIDER_SITE_OTHER): Payer: BLUE CROSS/BLUE SHIELD | Admitting: Psychiatry

## 2017-09-13 DIAGNOSIS — F1020 Alcohol dependence, uncomplicated: Secondary | ICD-10-CM

## 2017-09-14 ENCOUNTER — Telehealth: Payer: Self-pay

## 2017-09-14 ENCOUNTER — Observation Stay (HOSPITAL_COMMUNITY)
Admission: EM | Admit: 2017-09-14 | Discharge: 2017-09-15 | Disposition: A | Discharge status: Home / Self Care | Payer: BLUE CROSS/BLUE SHIELD | Attending: Internal Medicine | Admitting: Internal Medicine

## 2017-09-14 ENCOUNTER — Encounter (HOSPITAL_COMMUNITY): Payer: Self-pay | Admitting: Radiology

## 2017-09-14 ENCOUNTER — Ambulatory Visit (HOSPITAL_COMMUNITY)
Admission: RE | Admit: 2017-09-14 | Discharge: 2017-09-14 | Disposition: A | Discharge status: Home / Self Care | Payer: BLUE CROSS/BLUE SHIELD | Source: Ambulatory Visit | Attending: Internal Medicine | Admitting: Internal Medicine

## 2017-09-14 DIAGNOSIS — K7290 Hepatic failure, unspecified without coma: Secondary | ICD-10-CM | POA: Insufficient documentation

## 2017-09-14 DIAGNOSIS — K7031 Alcoholic cirrhosis of liver with ascites: Secondary | ICD-10-CM

## 2017-09-14 DIAGNOSIS — K3189 Other diseases of stomach and duodenum: Secondary | ICD-10-CM | POA: Insufficient documentation

## 2017-09-14 DIAGNOSIS — D696 Thrombocytopenia, unspecified: Secondary | ICD-10-CM | POA: Diagnosis not present

## 2017-09-14 DIAGNOSIS — D500 Iron deficiency anemia secondary to blood loss (chronic): Secondary | ICD-10-CM | POA: Diagnosis not present

## 2017-09-14 DIAGNOSIS — K922 Gastrointestinal hemorrhage, unspecified: Secondary | ICD-10-CM | POA: Insufficient documentation

## 2017-09-14 DIAGNOSIS — N183 Chronic kidney disease, stage 3 (moderate): Secondary | ICD-10-CM | POA: Diagnosis not present

## 2017-09-14 DIAGNOSIS — E871 Hypo-osmolality and hyponatremia: Secondary | ICD-10-CM | POA: Diagnosis not present

## 2017-09-14 DIAGNOSIS — K7460 Unspecified cirrhosis of liver: Secondary | ICD-10-CM | POA: Diagnosis present

## 2017-09-14 DIAGNOSIS — R188 Other ascites: Secondary | ICD-10-CM | POA: Diagnosis not present

## 2017-09-14 DIAGNOSIS — E785 Hyperlipidemia, unspecified: Secondary | ICD-10-CM | POA: Insufficient documentation

## 2017-09-14 DIAGNOSIS — F1011 Alcohol abuse, in remission: Secondary | ICD-10-CM | POA: Insufficient documentation

## 2017-09-14 DIAGNOSIS — I959 Hypotension, unspecified: Secondary | ICD-10-CM | POA: Diagnosis not present

## 2017-09-14 DIAGNOSIS — D689 Coagulation defect, unspecified: Secondary | ICD-10-CM | POA: Diagnosis not present

## 2017-09-14 DIAGNOSIS — N179 Acute kidney failure, unspecified: Secondary | ICD-10-CM | POA: Diagnosis not present

## 2017-09-14 DIAGNOSIS — I129 Hypertensive chronic kidney disease with stage 1 through stage 4 chronic kidney disease, or unspecified chronic kidney disease: Secondary | ICD-10-CM | POA: Insufficient documentation

## 2017-09-14 DIAGNOSIS — K7210 Chronic hepatic failure without coma: Secondary | ICD-10-CM | POA: Diagnosis present

## 2017-09-14 DIAGNOSIS — Z79899 Other long term (current) drug therapy: Secondary | ICD-10-CM | POA: Diagnosis not present

## 2017-09-14 DIAGNOSIS — D649 Anemia, unspecified: Secondary | ICD-10-CM | POA: Diagnosis present

## 2017-09-14 DIAGNOSIS — K766 Portal hypertension: Secondary | ICD-10-CM

## 2017-09-14 HISTORY — PX: IR PARACENTESIS: IMG2679

## 2017-09-14 LAB — BODY FLUID CELL COUNT WITH DIFFERENTIAL
LYMPHS FL: 60 %
MONOCYTE-MACROPHAGE-SEROUS FLUID: 35 % — AB (ref 50–90)
Neutrophil Count, Fluid: 5 % (ref 0–25)
Total Nucleated Cell Count, Fluid: 30 cu mm (ref 0–1000)

## 2017-09-14 LAB — CBC WITH DIFFERENTIAL/PLATELET
BASOS PCT: 1 %
Basophils Absolute: 0.1 10*3/uL (ref 0.0–0.1)
EOS PCT: 3 %
Eosinophils Absolute: 0.2 10*3/uL (ref 0.0–0.7)
HEMATOCRIT: 16.5 % — AB (ref 39.0–52.0)
Hemoglobin: 5.5 g/dL — CL (ref 13.0–17.0)
LYMPHS ABS: 0.5 10*3/uL — AB (ref 0.7–4.0)
Lymphocytes Relative: 8 %
MCH: 33.3 pg (ref 26.0–34.0)
MCHC: 33.3 g/dL (ref 30.0–36.0)
MCV: 100 fL (ref 78.0–100.0)
MONO ABS: 1.3 10*3/uL — AB (ref 0.1–1.0)
MONOS PCT: 19 %
NEUTROS PCT: 69 %
Neutro Abs: 4.7 10*3/uL (ref 1.7–7.7)
PLATELETS: 93 10*3/uL — AB (ref 150–400)
RBC: 1.65 MIL/uL — AB (ref 4.22–5.81)
RDW: 23 % — ABNORMAL HIGH (ref 11.5–15.5)
WBC: 6.8 10*3/uL (ref 4.0–10.5)

## 2017-09-14 LAB — COMPREHENSIVE METABOLIC PANEL
ALT: 34 U/L (ref 17–63)
AST: 75 U/L — AB (ref 15–41)
Albumin: 2.1 g/dL — ABNORMAL LOW (ref 3.5–5.0)
Alkaline Phosphatase: 80 U/L (ref 38–126)
Anion gap: 8 (ref 5–15)
BILIRUBIN TOTAL: 6.4 mg/dL — AB (ref 0.3–1.2)
BUN: 33 mg/dL — AB (ref 6–20)
CALCIUM: 8.3 mg/dL — AB (ref 8.9–10.3)
CHLORIDE: 96 mmol/L — AB (ref 101–111)
CO2: 23 mmol/L (ref 22–32)
CREATININE: 2.43 mg/dL — AB (ref 0.61–1.24)
GFR, EST AFRICAN AMERICAN: 33 mL/min — AB (ref >60)
GFR, EST NON AFRICAN AMERICAN: 28 mL/min — AB (ref >60)
Glucose, Bld: 128 mg/dL — ABNORMAL HIGH (ref 65–99)
Potassium: 4.5 mmol/L (ref 3.5–5.1)
Sodium: 127 mmol/L — ABNORMAL LOW (ref 135–145)
TOTAL PROTEIN: 5.3 g/dL — AB (ref 6.5–8.1)

## 2017-09-14 LAB — PREPARE RBC (CROSSMATCH)

## 2017-09-14 LAB — PROTIME-INR
INR: 2.58
PROTHROMBIN TIME: 27.5 s — AB (ref 11.4–15.2)

## 2017-09-14 MED ORDER — DEXTROSE 5 % IV SOLN
1.0000 g | Freq: Once | INTRAVENOUS | Status: AC
  Administered 2017-09-14: 1 g via INTRAVENOUS
  Filled 2017-09-14: qty 10

## 2017-09-14 MED ORDER — LACTULOSE 10 GM/15ML PO SOLN
20.0000 g | Freq: Three times a day (TID) | ORAL | Status: DC
  Administered 2017-09-14 – 2017-09-15 (×2): 20 g via ORAL
  Filled 2017-09-14 (×2): qty 30

## 2017-09-14 MED ORDER — LIDOCAINE HCL 1 % IJ SOLN
INTRAMUSCULAR | Status: AC
  Filled 2017-09-14: qty 20

## 2017-09-14 MED ORDER — DEXTROSE 5 % IV SOLN
2.0000 g | INTRAVENOUS | Status: DC
  Filled 2017-09-14: qty 2

## 2017-09-14 MED ORDER — MAGNESIUM OXIDE 400 (241.3 MG) MG PO TABS
200.0000 mg | ORAL_TABLET | Freq: Two times a day (BID) | ORAL | Status: DC
  Administered 2017-09-14 – 2017-09-15 (×2): 200 mg via ORAL
  Filled 2017-09-14 (×2): qty 1

## 2017-09-14 MED ORDER — PANTOPRAZOLE SODIUM 40 MG IV SOLR
40.0000 mg | Freq: Two times a day (BID) | INTRAVENOUS | Status: DC
  Administered 2017-09-14 – 2017-09-15 (×2): 40 mg via INTRAVENOUS
  Filled 2017-09-14 (×2): qty 40

## 2017-09-14 MED ORDER — ALBUMIN HUMAN 25 % IV SOLN
75.0000 g | Freq: Once | INTRAVENOUS | Status: AC
  Administered 2017-09-14: 75 g via INTRAVENOUS
  Filled 2017-09-14: qty 300

## 2017-09-14 MED ORDER — SODIUM CHLORIDE 0.9 % IV SOLN
Freq: Once | INTRAVENOUS | Status: AC
  Administered 2017-09-14: 17:00:00 via INTRAVENOUS

## 2017-09-14 MED ORDER — FERROUS SULFATE 325 (65 FE) MG PO TABS
325.0000 mg | ORAL_TABLET | Freq: Every evening | ORAL | Status: DC
  Administered 2017-09-14: 325 mg via ORAL
  Filled 2017-09-14: qty 1

## 2017-09-14 MED ORDER — LIDOCAINE HCL 1 % IJ SOLN
INTRAMUSCULAR | Status: DC | PRN
  Administered 2017-09-14: 8 mL

## 2017-09-14 MED ORDER — DEXTROSE 5 % IV SOLN
1.0000 g | INTRAVENOUS | Status: AC
  Administered 2017-09-15: 1 g via INTRAVENOUS
  Filled 2017-09-14: qty 10

## 2017-09-14 MED ORDER — ALBUMIN HUMAN 25 % IV SOLN
25.0000 g | Freq: Once | INTRAVENOUS | Status: DC
  Filled 2017-09-14: qty 100

## 2017-09-14 NOTE — ED Notes (Signed)
Contacted [facility] to tx to [place]

## 2017-09-14 NOTE — Telephone Encounter (Signed)
Pt had US guided para today at [facility], labs were drawn. Pts Hgb today is 5.5. Spoke with Nicoletta Ba PA and pt should be sent to the ER with severe anemia and receive 2 units blood, may need to stay over night due to time of day and length of transfusion. Azucena Freed PA notified as she is at [facility].

## 2017-09-14 NOTE — ED Provider Notes (Signed)
[facility] DEPT Provider Note   CSN: [id number] Arrival date & time: 09/14/17  1550     History   Chief Complaint Chief Complaint  Patient presents with  . Abnormal Lab    HPI Thomas [name] is a 56 y.o. male.  56 yo M with a significant past medical history of alcoholic cirrhosis comes in with a chief complaint of a hemoglobin of 5-1/2. This was noted in GI clinic this morning. The patient has been having some cheer wine colored stools. He has been mildly weak but not much more than his baseline. Denies abdominal pain fever vomiting. Has been having dark stools longer than that there was on iron infusions. Had a paracentesis today and afterwards was sent down to the ED for transfusion and possible admission. They have been holding his diuretics due to some worsening renal function.   The history is provided by the patient and the spouse.  Illness  This is a new problem. The current episode started more than 2 days ago. The problem occurs constantly. The problem has not changed since onset.Pertinent negatives include no chest pain, no abdominal pain, no headaches and no shortness of breath. Nothing aggravates the symptoms. Nothing relieves the symptoms. He has tried nothing for the symptoms. The treatment provided no relief.    Past Medical History:  Diagnosis Date  . Alcohol abuse 2013  . Alcoholic cirrhosis ([place]) 02/7168  . Anemia 02/2016   in setting of GI blood loss, but MCV macrocytic.   . Coagulopathy ([place]) 05/2015  . Diverticulosis 09/2015  . Esophageal varices ([place]) 09/2015   small  . GERD (gastroesophageal reflux disease)   . Hyperlipidemia   . Hypertension   . Pneumonia   . Portal hypertension ([place]) 09/2015   with portal gastropathy  . Thrombocytopenia ([place]) 02/2016  . Tubular adenoma of colon 09/2015    Patient Active Problem List   Diagnosis Date Noted  . GI bleed 09/14/2017  . Acute hepatic encephalopathy 09/04/2017  . Acute upper GI bleed 06/13/2017  .  HCAP (healthcare-associated pneumonia) 04/27/2017  . Acute renal failure (ARF) ([place]) 03/28/2017  . Hyperglycemia 03/28/2017  . Hypoalbuminemia 03/28/2017  . Edema 03/28/2017  . S/P thoracentesis   . Symptomatic anemia   . Dyspnea 03/03/2017  . Hypomagnesemia 11/26/2016  . Acute respiratory failure with hypoxia ([place]) 11/24/2016  . Pleural effusion associated with hepatic disorder 11/24/2016  . Hypokalemia 11/24/2016  . SBP (spontaneous bacterial peritonitis) ([place]) 05/09/2016  . Severe sepsis ([place]) 05/09/2016  . Decompensation of cirrhosis of liver ([place]) 05/07/2016  . Hypotension 05/07/2016  . Ascites due to alcoholic cirrhosis ([place]) 67/89/3810  . Localized edema 04/17/2016  . Alcoholic cirrhosis of liver with ascites ([place])   . End stage liver disease ([place])   . Upper GI bleed 03/05/2016  . Coagulopathy ([place]) 03/05/2016  . Acute kidney injury ([place]) 03/05/2016  . Hepatic encephalopathy ([place]) 03/05/2016  . Macrocytic anemia 03/05/2016  . Thrombocytopenia ([place]) 03/05/2016  . Esophageal varices ([place]) 03/05/2016  . Iron deficiency anemia due to chronic blood loss 03/05/2016  . Idiopathic esophageal varices without bleeding ([place])   . Portal hypertensive gastropathy [facility])     Past Surgical History:  Procedure Laterality Date  . ESOPHAGOGASTRODUODENOSCOPY N/A 03/05/2016   Procedure: ESOPHAGOGASTRODUODENOSCOPY (EGD);  Surgeon: Ladene Artist, MD;  Location: [name];  Service: Endoscopy;  Laterality: N/A;  . ESOPHAGOGASTRODUODENOSCOPY N/A 04/29/2017   Procedure: ESOPHAGOGASTRODUODENOSCOPY (EGD);  Surgeon: Milus Banister, MD;  Location: [name];  Service: Endoscopy;  Laterality: N/A;  . ESOPHAGOGASTRODUODENOSCOPY (EGD) WITH PROPOFOL N/A 03/29/2017   Procedure: ESOPHAGOGASTRODUODENOSCOPY (EGD) WITH PROPOFOL;  Surgeon: Doran Stabler, MD;  Location: [facility] ENDOSCOPY;  Service: Endoscopy;  Laterality: N/A;  . IR PARACENTESIS  05/15/2017  . IR PARACENTESIS  06/11/2017  . IR PARACENTESIS   06/26/2017  . IR PARACENTESIS  07/10/2017  . IR PARACENTESIS  08/16/2017  . IR PARACENTESIS  09/14/2017  . IR THORACENTESIS ASP PLEURAL SPACE W/IMG GUIDE  06/28/2017  . testicle prosthesis         Home Medications    Prior to Admission medications   Medication Sig Start Date End Date Taking? Authorizing Provider  benzonatate (TESSALON) 100 MG capsule Take 1 capsule (100 mg total) by mouth 2 (two) times daily. 09/07/17   Robbie Lis, MD  bismuth subsalicylate (PEPTO BISMOL) 262 MG chewable tablet Chew 262-524 mg by mouth 3 (three) times daily as needed for indigestion.    [provider]  calcium carbonate (TUMS EX) 750 MG chewable tablet Chew 1-2 tablets by mouth 3 (three) times daily as needed for heartburn.    [provider]  calcium-vitamin D (OSCAL WITH D) 500-200 MG-UNIT tablet Take 1 tablet by mouth 2 (two) times daily.    [provider]  ciprofloxacin (CIPRO) 500 MG tablet TAKE 1 TABLET BY MOUTH EVERY EVENING 08/20/17   Pyrtle, Lajuan Lines, MD  Dextromethorphan HBr (TUSSIN COUGH PO) Take 2 capsules by mouth at bedtime.     [provider]  ergocalciferol (VITAMIN D2) 50000 units capsule Take 50,000 Units by mouth once a week.    [provider]  ferrous sulfate 325 (65 FE) MG tablet Take 1 tablet (325 mg total) by mouth 3 (three) times daily with meals. Patient taking differently: Take 325 mg by mouth every evening.  04/30/17   Patrecia Pour, Christean Grief, MD  folic acid (FOLVITE) 1 MG tablet TAKE 1 TABLET BY MOUTH AT BEDTIME 08/20/17   Pyrtle, Lajuan Lines, MD  lactulose (CEPHULAC) 20 g packet Take 1 packet (20 g total) by mouth 3 (three) times daily. 08/25/17   Regalado, Belkys A, MD  magnesium oxide (MAG-OX) 400 (241.3 Mg) MG tablet Take 0.5 tablets (200 mg total) by mouth 2 (two) times daily. 08/25/17   Regalado, Belkys A, MD  pantoprazole (PROTONIX) 40 MG tablet TAKE 1 TABLET (40 MG TOTAL) BY MOUTH 2 (TWO) TIMES DAILY. 07/06/17   Pyrtle, Lajuan Lines, MD  sucralfate  (CARAFATE) 1 g tablet TAKE 1 TABLET BY MOUTH 4 TIMES DAILY 08/23/17   Pyrtle, Lajuan Lines, MD  thiamine 100 MG tablet Take 1 tablet (100 mg total) by mouth daily. 06/30/17   Robbie Lis, MD    Family History Family History  Problem Relation Age of Onset  . Diabetes Father   . Parkinson's disease Father   . Diabetes Paternal Aunt   . Glaucoma Mother     Social History Social History  Substance Use Topics  . Smoking status: Never Smoker  . Smokeless tobacco: Never Used  . Alcohol use No     Comment: Quit 2/17, relapse in 1/18 for short period of time     Allergies   Xifaxan [rifaximin]   Review of Systems Review of Systems  Constitutional: Negative for chills and fever.  HENT: Negative for congestion and facial swelling.   Eyes: Negative for discharge and visual disturbance.  Respiratory: Negative for shortness of breath.   Cardiovascular: Negative for chest pain and palpitations.  Gastrointestinal: Negative  for abdominal pain, diarrhea and vomiting.  Musculoskeletal: Negative for arthralgias and myalgias.  Skin: Negative for color change and rash.  Neurological: Negative for tremors, syncope and headaches.  Psychiatric/Behavioral: Negative for confusion and dysphoric mood.     Physical Exam Updated Vital Signs BP (!) 100/50   Pulse 81   Temp 97.8 F (36.6 C) (Oral)   Ht 5' 4.5" (1.638 m)   Wt 81.6 kg (180 lb)   SpO2 91%   BMI 30.42 kg/m   Physical Exam  Constitutional: He is oriented to person, place, and time. He appears well-developed and well-nourished.  jaundice  HENT:  Head: Normocephalic and atraumatic.  Eyes: Pupils are equal, round, and reactive to light. EOM are normal.  Neck: Normal range of motion. Neck supple. No JVD present.  Cardiovascular: Normal rate and regular rhythm.  Exam reveals no gallop and no friction rub.   No murmur heard. Pulmonary/Chest: No respiratory distress. He has no wheezes.  Abdominal: He exhibits distension (mild). He  exhibits no mass. There is no tenderness. There is no rebound and no guarding.  Musculoskeletal: Normal range of motion.  Neurological: He is alert and oriented to person, place, and time.  Skin: No rash noted. No pallor.  Psychiatric: He has a normal mood and affect. His behavior is normal.  Nursing note and vitals reviewed.    ED Treatments / Results  Labs (all labs ordered are listed, but only abnormal results are displayed) Labs Reviewed  CBC  URINALYSIS, ROUTINE W REFLEX MICROSCOPIC  SODIUM, URINE, RANDOM  TYPE AND SCREEN  PREPARE RBC (CROSSMATCH)    EKG  EKG Interpretation None       Radiology Ir Paracentesis  Result Date: 09/14/2017 INDICATION: Abdominal distention secondary recurrent ascites. Request for diagnostic and therapeutic paracentesis. EXAM: ULTRASOUND GUIDED LEFT LOWER QUADRANT PARACENTESIS MEDICATIONS: None. COMPLICATIONS: None immediate. PROCEDURE: Informed written consent was obtained from the patient after a discussion of the risks, benefits and alternatives to treatment. A timeout was performed prior to the initiation of the procedure. Initial ultrasound scanning demonstrates a large amount of ascites within the the left lower abdominal quadrant. The left lower abdomen was prepped and draped in the usual sterile fashion. 1% lidocaine with epinephrine was used for local anesthesia. Following this, a 19 gauge, 7-cm, Yueh catheter was introduced. An ultrasound image was saved for documentation purposes. The paracentesis was performed. The catheter was removed and a dressing was applied. The patient tolerated the procedure well without immediate post procedural complication. FINDINGS: A total of approximately 3.75 L of clear yellow fluid was removed. Samples were sent to the laboratory as requested by the clinical team. IMPRESSION: Successful ultrasound-guided paracentesis yielding 3.75 liters of peritoneal fluid. Read by: Ascencion Dike PA-C Electronically Signed    By: Jerilynn Mages.  Shick M.D.   On: 09/14/2017 15:04    Procedures Procedures (including critical care time)  Medications Ordered in ED Medications  0.9 %  sodium chloride infusion (not administered)  pantoprazole (PROTONIX) injection 40 mg (not administered)  cefTRIAXone (ROCEPHIN) 2 g in dextrose 5 % 50 mL IVPB (not administered)  cefTRIAXone (ROCEPHIN) 1 g in dextrose 5 % 50 mL IVPB (not administered)  cefTRIAXone (ROCEPHIN) 1 g in dextrose 5 % 50 mL IVPB (0 g Intravenous Stopped 09/14/17 1717)     Initial Impression / Assessment and Plan / ED Course  I have reviewed the triage vital signs and the nursing notes.  Pertinent labs & imaging results that were available during my care of  the patient were reviewed by me and considered in my medical decision making (see chart for details).     56 yo M with a chief complaint of a possible upper GI bleed. He was sent from GI clinic for transfusion and possible admission. Will discuss with GI. Discussed with Azucena Freed, feels the patient likely needs to be admitted for liver failure with an upper GI bleed. Will give Rocephin with his history of cirrhosis. protonix. Discussed the hospitalist.  CRITICAL CARE Performed by: Cecilio Asper   Total critical care time: 35 minutes  Critical care time was exclusive of separately billable procedures and treating other patients.  Critical care was necessary to treat or prevent imminent or life-threatening deterioration.  Critical care was time spent personally by me on the following activities: development of treatment plan with patient and/or surrogate as well as nursing, discussions with consultants, evaluation of patient's response to treatment, examination of patient, obtaining history from patient or surrogate, ordering and performing treatments and interventions, ordering and review of laboratory studies, ordering and review of radiographic studies, pulse oximetry and re-evaluation of patient's  condition.  The patients results and plan were reviewed and discussed.   Any x-rays performed were independently reviewed by myself.   Differential diagnosis were considered with the presenting HPI.  Medications  0.9 %  sodium chloride infusion (not administered)  pantoprazole (PROTONIX) injection 40 mg (not administered)  cefTRIAXone (ROCEPHIN) 2 g in dextrose 5 % 50 mL IVPB (not administered)  cefTRIAXone (ROCEPHIN) 1 g in dextrose 5 % 50 mL IVPB (not administered)  cefTRIAXone (ROCEPHIN) 1 g in dextrose 5 % 50 mL IVPB (0 g Intravenous Stopped 09/14/17 1717)    Vitals:   09/14/17 1648 09/14/17 1700 09/14/17 1715 09/14/17 1730  BP:  (!) 101/54 (!) 101/55 (!) 100/50  Pulse:  86 84 81  Temp: 97.8 F (36.6 C)   97.8 F (36.6 C)  TempSrc: Oral   Oral  SpO2:  94% 96% 91%  Weight:      Height:        Final diagnoses:  Upper GI bleed    Admission/ observation were discussed with the admitting physician, patient and/or family and they are comfortable with the plan.    Final Clinical Impressions(s) / ED Diagnoses   Final diagnoses:  Upper GI bleed    New Prescriptions New Prescriptions   No medications on file     Deno Etienne, DO 09/14/17 1736

## 2017-09-14 NOTE — ED Notes (Signed)
2 units blood cells ready

## 2017-09-14 NOTE — ED Triage Notes (Signed)
Pt arrived on stretcher from IR.  Transporter reports pt got 4014ml taken off by thoracentesis given 75grams Albumin.  Hgb 5.5.

## 2017-09-14 NOTE — H&P (Signed)
History and Physical    Thomas [name] NID:[id number] DOB: 08-13-1961 DOA: 09/14/2017  I have briefly reviewed the patient's prior medical records in [facility]  PCP: Alroy Dust, L.Marlou Sa, MD  Patient coming from: Home  Chief Complaint: Symptomatic anemia  HPI: Thomas [name] is a 56 y.o. male with medical history significant of alcoholic cirrhosis with end-stage liver disease currently being evaluated for transplant, who was seen in the interventional radiology today for regular paracentesis, and on blood work he was found to have a hemoglobin of 5.5, previous value was done 4 days ago and it was 7.9.  Patient was directed for admission for gastroenterology consultation and blood transfusions.  Patient's cirrhosis was comp gated by portal hypertension with ascites, history of hepatic hydrothorax, severe portal gastropathy, hepatic encephalopathy and prior SBP on SBP prophylaxis.  He was recently hospitalized and discharged last week with hepatic encephalopathy, saw Dr. Hilarie Fredrickson in office just 4 days ago when he was doing good at that time.  It was noted that he saw that his stools at that time was slightly red in color but no overt blood, melena or hematochezia.  Patient denies for me any active bright red blood per rectum or significant melena.  Of note, he is on iron supplementation.  He has no chest pain, no shortness of breath.  He denies any fever or chills.  He denies any abdominal pain.  He just had 3.75 L removed in the interventional radiology and this was followed by albumin administration.  He denies any lightheadedness or dizziness.  Is also been complaining of worsening lower extremity swelling after he his diuretics were stopped last week when he was discharged, this was in light of his kidney disease.  ED Course: In the emergency room his blood pressure is soft, 14E-315 systolic, he is not tachycardic, he is satting well on room air.  His blood work reveals hyponatremia with a sodium of 127,  creatinine of 2.43, with a BUN of 33.  His hemoglobin was down at 5.5.  His platelets are 93.  He was ordered to be transfused 2 units of packed red blood cells and we were asked to admit.  EDP consulted gastroenterology who will see patient on 10/6 in the morning  Review of Systems: As per HPI otherwise 10 point review of systems negative.   Past Medical History:  Diagnosis Date  . Alcohol abuse 2013  . Alcoholic cirrhosis ([place]) 03/85  . Anemia 02/2016   in setting of GI blood loss, but MCV macrocytic.   . Coagulopathy ([place]) 05/2015  . Diverticulosis 09/2015  . Esophageal varices ([place]) 09/2015   small  . GERD (gastroesophageal reflux disease)   . Hyperlipidemia   . Hypertension   . Pneumonia   . Portal hypertension ([place]) 09/2015   with portal gastropathy  . Thrombocytopenia ([place]) 02/2016  . Tubular adenoma of colon 09/2015    Past Surgical History:  Procedure Laterality Date  . ESOPHAGOGASTRODUODENOSCOPY N/A 03/05/2016   Procedure: ESOPHAGOGASTRODUODENOSCOPY (EGD);  Surgeon: Ladene Artist, MD;  Location: [name];  Service: Endoscopy;  Laterality: N/A;  . ESOPHAGOGASTRODUODENOSCOPY N/A 04/29/2017   Procedure: ESOPHAGOGASTRODUODENOSCOPY (EGD);  Surgeon: Milus Banister, MD;  Location: [name];  Service: Endoscopy;  Laterality: N/A;  . ESOPHAGOGASTRODUODENOSCOPY (EGD) WITH PROPOFOL N/A 03/29/2017   Procedure: ESOPHAGOGASTRODUODENOSCOPY (EGD) WITH PROPOFOL;  Surgeon: Doran Stabler, MD;  Location: [facility];  Service: Endoscopy;  Laterality: N/A;  . IR PARACENTESIS  05/15/2017  . IR PARACENTESIS  06/11/2017  .  IR PARACENTESIS  06/26/2017  . IR PARACENTESIS  07/10/2017  . IR PARACENTESIS  08/16/2017  . IR PARACENTESIS  09/14/2017  . IR THORACENTESIS ASP PLEURAL SPACE W/IMG GUIDE  06/28/2017  . testicle prosthesis       reports that he has never smoked. He has never used smokeless tobacco. He reports that he does not drink alcohol or use drugs.  Allergies  Allergen  Reactions  . Xifaxan [Rifaximin] Swelling and Other (See Comments)    Reaction:  All over body swelling     Family History  Problem Relation Age of Onset  . Diabetes Father   . Parkinson's disease Father   . Diabetes Paternal Aunt   . Glaucoma Mother     Prior to Admission medications   Medication Sig Start Date End Date Taking? Authorizing Provider  benzonatate (TESSALON) 100 MG capsule Take 1 capsule (100 mg total) by mouth 2 (two) times daily. 09/07/17   Robbie Lis, MD  bismuth subsalicylate (PEPTO BISMOL) 262 MG chewable tablet Chew 262-524 mg by mouth 3 (three) times daily as needed for indigestion.    [provider]  calcium carbonate (TUMS EX) 750 MG chewable tablet Chew 1-2 tablets by mouth 3 (three) times daily as needed for heartburn.    [provider]  calcium-vitamin D (OSCAL WITH D) 500-200 MG-UNIT tablet Take 1 tablet by mouth 2 (two) times daily.    [provider]  ciprofloxacin (CIPRO) 500 MG tablet TAKE 1 TABLET BY MOUTH EVERY EVENING 08/20/17   Pyrtle, Lajuan Lines, MD  Dextromethorphan HBr (TUSSIN COUGH PO) Take 2 capsules by mouth at bedtime.     [provider]  ergocalciferol (VITAMIN D2) 50000 units capsule Take 50,000 Units by mouth once a week.    [provider]  ferrous sulfate 325 (65 FE) MG tablet Take 1 tablet (325 mg total) by mouth 3 (three) times daily with meals. Patient taking differently: Take 325 mg by mouth every evening.  04/30/17   Patrecia Pour, Christean Grief, MD  folic acid (FOLVITE) 1 MG tablet TAKE 1 TABLET BY MOUTH AT BEDTIME 08/20/17   Pyrtle, Lajuan Lines, MD  lactulose (CEPHULAC) 20 g packet Take 1 packet (20 g total) by mouth 3 (three) times daily. 08/25/17   Regalado, Belkys A, MD  magnesium oxide (MAG-OX) 400 (241.3 Mg) MG tablet Take 0.5 tablets (200 mg total) by mouth 2 (two) times daily. 08/25/17   Regalado, Belkys A, MD  pantoprazole (PROTONIX) 40 MG tablet TAKE 1 TABLET (40 MG TOTAL) BY MOUTH 2 (TWO) TIMES  DAILY. 07/06/17   Pyrtle, Lajuan Lines, MD  sucralfate (CARAFATE) 1 g tablet TAKE 1 TABLET BY MOUTH 4 TIMES DAILY 08/23/17   Pyrtle, Lajuan Lines, MD  thiamine 100 MG tablet Take 1 tablet (100 mg total) by mouth daily. 06/30/17   Robbie Lis, MD    Physical Exam: Vitals:   09/14/17 1600 09/14/17 1630 09/14/17 1645 09/14/17 1648  BP: (!) 95/53 (!) 98/55 (!) 100/53   Pulse: 83 83 83   Temp:    97.8 F (36.6 C)  TempSrc:    Oral  SpO2: 93% 94% 93%   Weight:      Height:          Constitutional: NAD, calm, comfortable, visibly icteric Caucasian male Eyes: PERRL, lids and conjunctivae icteric ENMT: Mucous membranes are moist. Posterior pharynx clear of any exudate or lesions. Neck: normal, supple Respiratory: clear to auscultation bilaterally, no wheezing, no crackles. Normal respiratory  effort. No accessory muscle use.  Cardiovascular: Regular rate and rhythm, no murmurs / rubs / gallops.  2+ extremity edema. 2+ pedal pulses.  Abdomen: Soft, mildly distended, no tenderness.  Bowel sounds positive. Musculoskeletal: no clubbing / cyanosis.  Decreased muscle tone.  Skin: no rashes, lesions, ulcers. No induration Neurologic: CN 2-12 grossly intact. Strength 5/5 in all 4.  No asterixis Psychiatric: Normal judgment and insight. Alert and oriented x 3. Normal mood.   Labs on Admission: I have personally reviewed following labs and imaging studies  CBC:  Recent Labs Lab 09/10/17 1431 09/14/17 1350  WBC 6.3 6.8  NEUTROABS 4.6 4.7  HGB 7.9 Repeated and verified X2.* 5.5*  HCT 23.2 Repeated and verified X2.* 16.5*  MCV 103.1* 100.0  PLT 66.0* 93*   Basic Metabolic Panel:  Recent Labs Lab 09/10/17 1431 09/14/17 1350  NA 129* 127*  K 3.9 4.5  CL 98 96*  CO2 24 23  GLUCOSE 142* 128*  BUN 22 33*  CREATININE 1.50 2.43*  CALCIUM 8.3* 8.3*   GFR: Estimated Creatinine Clearance: 33.5 mL/min (A) (by C-G formula based on SCr of 2.43 mg/dL (H)). Liver Function Tests:  Recent Labs Lab  09/10/17 1407 09/14/17 1350  AST 57* 75*  ALT 31 34  ALKPHOS 103 80  BILITOT 5.8* 6.4*  PROT 5.5* 5.3*  ALBUMIN 2.3* 2.1*   No results for input(s): LIPASE, AMYLASE in the last 168 hours. No results for input(s): AMMONIA in the last 168 hours. Coagulation Profile:  Recent Labs Lab 09/10/17 1431 09/14/17 1350  INR 2.5* 2.58   Cardiac Enzymes: No results for input(s): CKTOTAL, CKMB, CKMBINDEX, TROPONINI in the last 168 hours. BNP (last 3 results) No results for input(s): PROBNP in the last 8760 hours. HbA1C: No results for input(s): HGBA1C in the last 72 hours. CBG: No results for input(s): GLUCAP in the last 168 hours. Lipid Profile: No results for input(s): CHOL, HDL, LDLCALC, TRIG, CHOLHDL, LDLDIRECT in the last 72 hours. Thyroid Function Tests: No results for input(s): TSH, T4TOTAL, FREET4, T3FREE, THYROIDAB in the last 72 hours. Anemia Panel: No results for input(s): VITAMINB12, FOLATE, FERRITIN, TIBC, IRON, RETICCTPCT in the last 72 hours. Urine analysis:    Component Value Date/Time   COLORURINE AMBER (A) 08/23/2017 0011   APPEARANCEUR CLEAR 08/23/2017 0011   LABSPEC 1.016 08/23/2017 0011   PHURINE 6.0 08/23/2017 0011   GLUCOSEU NEGATIVE 08/23/2017 0011   HGBUR NEGATIVE 08/23/2017 0011   BILIRUBINUR NEGATIVE 08/23/2017 0011   KETONESUR NEGATIVE 08/23/2017 0011   PROTEINUR NEGATIVE 08/23/2017 0011   UROBILINOGEN 0.2 11/22/2012 0856   NITRITE NEGATIVE 08/23/2017 0011   LEUKOCYTESUR NEGATIVE 08/23/2017 0011     Radiological Exams on Admission: Ir Paracentesis  Result Date: 09/14/2017 INDICATION: Abdominal distention secondary recurrent ascites. Request for diagnostic and therapeutic paracentesis. EXAM: ULTRASOUND GUIDED LEFT LOWER QUADRANT PARACENTESIS MEDICATIONS: None. COMPLICATIONS: None immediate. PROCEDURE: Informed written consent was obtained from the patient after a discussion of the risks, benefits and alternatives to treatment. A timeout was  performed prior to the initiation of the procedure. Initial ultrasound scanning demonstrates a large amount of ascites within the the left lower abdominal quadrant. The left lower abdomen was prepped and draped in the usual sterile fashion. 1% lidocaine with epinephrine was used for local anesthesia. Following this, a 19 gauge, 7-cm, Yueh catheter was introduced. An ultrasound image was saved for documentation purposes. The paracentesis was performed. The catheter was removed and a dressing was applied. The patient tolerated the procedure well  without immediate post procedural complication. FINDINGS: A total of approximately 3.75 L of clear yellow fluid was removed. Samples were sent to the laboratory as requested by the clinical team. IMPRESSION: Successful ultrasound-guided paracentesis yielding 3.75 liters of peritoneal fluid. Read by: Ascencion Dike PA-C Electronically Signed   By: Jerilynn Mages.  Shick M.D.   On: 09/14/2017 15:04    Assessment/Plan Active Problems:   Upper GI bleed   Coagulopathy (HCC)   Acute kidney injury (HCC)   Thrombocytopenia (HCC)   Iron deficiency anemia due to chronic blood loss   Portal hypertensive gastropathy (HCC)   End stage liver disease (HCC)   Alcoholic cirrhosis of liver with ascites (HCC)   Symptomatic anemia   Ascites due to alcoholic cirrhosis (HCC)   Decompensation of cirrhosis of liver (HCC)   Hypotension   Acute upper GI bleed   GI bleed   Symptomatic anemia -Concern for upper GI bleed, patient with known portal gastropathy, gastroenterology consulted, discussed over the phone with Azucena Freed, PA, they will see in consultation tomorrow morning.  For now place patient on Protonix, ceftriaxone, transfuse 2 units of packed red blood cells to bring hemoglobin above 7.  His bleed seems to be chronic in nature and no overt bleeding is clinically apparent at this point. -Per GI, allow clear liquid diet for now, make n.p.o. after midnight in case they decide to do  EGD tomorrow  End-stage liver disease in the setting of alcoholic cirrhosis with ascites and prior history of hepatic encephalopathy -Patient currently being evaluated for liver transplantation, his MELD score on admission is 35 confirming a very poor prognosis -Continue home medications of lactulose -She he is status post paracentesis with 3.75 L removed followed by albumin administration  Acute kidney injury with underlying chronic kidney disease stage III -Patient with worsening creatinine of 2.43, likely multifactorial due to repeat paracentesis and concern for hepatorenal syndrome as well as anemia -his diuretics have been on hold since his last hospital stay, and he is fluid overloaded.  He will receive blood transfusions which will correct anemia and provide some intravascular volume, will recheck creatinine in the morning -check a urine sodium and a urinalysis  Hyponatremia -Likely in the setting of fluid overload, repeat in the morning  Hypotension -Patient's blood pressures chronically running low, he is asymptomatic with this  Thrombocytopenia -In the setting of liver disease, monitor CBC  Iron deficiency anemia due to chronic GI blood loss -Continue home iron supplementation   DVT prophylaxis: SCDs  Code Status: Full code  Family Communication: wife at bedside Disposition Plan: admit to [place] called: GI     Admission status: Observation   At the point of initial evaluation, it is my clinical opinion that admission for OBSERVATION is reasonable and necessary because the patient's presenting complaints in the context of their chronic conditions represent sufficient risk of deterioration or significant morbidity to constitute reasonable grounds for close observation in the hospital setting, but that the patient may be medically stable for discharge from the hospital within 24 to 48 hours.     Marzetta Board, MD [facility] Pager [phone]  If 7PM-7AM, please contact night-coverage [email or web address] Password [email or web address]  09/14/2017, 5:13 PM

## 2017-09-14 NOTE — Progress Notes (Signed)
Critical value received from lab hgb 5.5. Spoke with Dr Vena Rua nurse she states that per Nicoletta Ba, NP pt should go to ED for severe anemia and blood transfusions. Pt and family verbalized understanding.

## 2017-09-14 NOTE — Procedures (Signed)
PROCEDURE SUMMARY:  Successful US guided paracentesis from [facility].  Yielded 3.75 L of clear yellow fluid.  No immediate complications.  Pt tolerated well.   Specimen was sent for labs.  Ascencion Dike PA-C 09/14/2017 3:01 PM

## 2017-09-15 DIAGNOSIS — K7031 Alcoholic cirrhosis of liver with ascites: Secondary | ICD-10-CM | POA: Diagnosis not present

## 2017-09-15 DIAGNOSIS — D62 Acute posthemorrhagic anemia: Secondary | ICD-10-CM

## 2017-09-15 DIAGNOSIS — K7290 Hepatic failure, unspecified without coma: Secondary | ICD-10-CM

## 2017-09-15 DIAGNOSIS — K922 Gastrointestinal hemorrhage, unspecified: Secondary | ICD-10-CM | POA: Diagnosis not present

## 2017-09-15 DIAGNOSIS — R710 Precipitous drop in hematocrit: Secondary | ICD-10-CM | POA: Diagnosis not present

## 2017-09-15 DIAGNOSIS — K319 Disease of stomach and duodenum, unspecified: Secondary | ICD-10-CM

## 2017-09-15 DIAGNOSIS — D689 Coagulation defect, unspecified: Secondary | ICD-10-CM | POA: Diagnosis not present

## 2017-09-15 DIAGNOSIS — N179 Acute kidney failure, unspecified: Secondary | ICD-10-CM | POA: Diagnosis not present

## 2017-09-15 LAB — TYPE AND SCREEN
ABO/RH(D): A POS
ANTIBODY SCREEN: NEGATIVE
Unit division: 0
Unit division: 0

## 2017-09-15 LAB — COMPREHENSIVE METABOLIC PANEL
ALT: 27 U/L (ref 17–63)
ANION GAP: 10 (ref 5–15)
AST: 48 U/L — ABNORMAL HIGH (ref 15–41)
Albumin: 3.2 g/dL — ABNORMAL LOW (ref 3.5–5.0)
Alkaline Phosphatase: 67 U/L (ref 38–126)
BUN: 34 mg/dL — ABNORMAL HIGH (ref 6–20)
CHLORIDE: 96 mmol/L — AB (ref 101–111)
CO2: 22 mmol/L (ref 22–32)
CREATININE: 2.08 mg/dL — AB (ref 0.61–1.24)
Calcium: 8.6 mg/dL — ABNORMAL LOW (ref 8.9–10.3)
GFR, EST AFRICAN AMERICAN: 40 mL/min — AB (ref >60)
GFR, EST NON AFRICAN AMERICAN: 34 mL/min — AB (ref >60)
Glucose, Bld: 103 mg/dL — ABNORMAL HIGH (ref 65–99)
Potassium: 3.8 mmol/L (ref 3.5–5.1)
SODIUM: 128 mmol/L — AB (ref 135–145)
Total Bilirubin: 9.2 mg/dL — ABNORMAL HIGH (ref 0.3–1.2)
Total Protein: 5.7 g/dL — ABNORMAL LOW (ref 6.5–8.1)

## 2017-09-15 LAB — BPAM RBC
BLOOD PRODUCT EXPIRATION DATE: 201810122359
Blood Product Expiration Date: 201810172359
ISSUE DATE / TIME: 201810051734
ISSUE DATE / TIME: 201810051918
UNIT TYPE AND RH: 600
UNIT TYPE AND RH: 6200

## 2017-09-15 LAB — CBC
HCT: 20 % — ABNORMAL LOW (ref 39.0–52.0)
HEMOGLOBIN: 7.1 g/dL — AB (ref 13.0–17.0)
MCH: 33.6 pg (ref 26.0–34.0)
MCHC: 35.5 g/dL (ref 30.0–36.0)
MCV: 94.8 fL (ref 78.0–100.0)
PLATELETS: 78 10*3/uL — AB (ref 150–400)
RBC: 2.11 MIL/uL — AB (ref 4.22–5.81)
RDW: 21.9 % — ABNORMAL HIGH (ref 11.5–15.5)
WBC: 6.3 10*3/uL (ref 4.0–10.5)

## 2017-09-15 LAB — URINALYSIS, ROUTINE W REFLEX MICROSCOPIC
BILIRUBIN URINE: NEGATIVE
GLUCOSE, UA: NEGATIVE mg/dL
HGB URINE DIPSTICK: NEGATIVE
Ketones, ur: 5 mg/dL — AB
NITRITE: NEGATIVE
PH: 5 (ref 5.0–8.0)
Protein, ur: NEGATIVE mg/dL
Specific Gravity, Urine: 1.015 (ref 1.005–1.030)

## 2017-09-15 LAB — PROTIME-INR
INR: 2.74
PROTHROMBIN TIME: 28.8 s — AB (ref 11.4–15.2)

## 2017-09-15 LAB — SODIUM, URINE, RANDOM

## 2017-09-15 NOTE — Progress Notes (Signed)
Pt leaving this afternoon with his spouse. Alert, oriented, and without c/o. Discharge instructions/prescriptions given/explained with pt and spouse verbalizing understanding.  Followup appt noted.

## 2017-09-15 NOTE — Discharge Summary (Signed)
Discharge Summary  Thomas [name] ZES:[id number] DOB: 03/26/1961  PCP: Alroy Dust, L.Marlou Sa, MD  Admit date: 09/14/2017 Discharge date: 09/15/2017  Time spent: <54mins  Recommendations for Outpatient Follow-up:  1. F/u with PMD within a week  for hospital discharge follow up, repeat cbc/bmp at follow up 2. F/u with [facility]gi on 10/11 3. F/u with [facility] on 10/17  Hold diuretics at discharge  Discharge Diagnoses:  Active Hospital Problems   Diagnosis Date Noted  . GI bleed 09/14/2017  . Acute upper GI bleed 06/13/2017  . Symptomatic anemia   . Decompensation of cirrhosis of liver ([place]) 05/07/2016  . Hypotension 05/07/2016  . Ascites due to alcoholic cirrhosis ([place]) 26/33/3545  . Alcoholic cirrhosis of liver with ascites ([place])   . End stage liver disease ([place])   . Acute kidney injury ([place]) 03/05/2016  . Coagulopathy ([place]) 03/05/2016  . Iron deficiency anemia due to chronic blood loss 03/05/2016  . Thrombocytopenia ([place]) 03/05/2016  . Upper GI bleed 03/05/2016  . Portal hypertensive gastropathy [facility])     Resolved Hospital Problems   Diagnosis Date Noted Date Resolved  No resolved problems to display.    Discharge Condition: stable  Diet recommendation: heart healthy  Filed Weights   09/14/17 1559  Weight: 81.6 kg (180 lb)    History of present illness:  PCP: Alroy Dust, L.Marlou Sa, MD  Patient coming from: Home  Chief Complaint: Symptomatic anemia  HPI: Thomas [name] is a 56 y.o. male with medical history significant of alcoholic cirrhosis with end-stage liver disease currently being evaluated for transplant, who was seen in the interventional radiology today for regular paracentesis, and on blood work he was found to have a hemoglobin of 5.5, previous value was done 4 days ago and it was 7.9.  Patient was directed for admission for gastroenterology consultation and blood transfusions.  Patient's cirrhosis was comp gated by portal hypertension with ascites,  history of hepatic hydrothorax, severe portal gastropathy, hepatic encephalopathy and prior SBP on SBP prophylaxis.  He was recently hospitalized and discharged last week with hepatic encephalopathy, saw Dr. Hilarie Fredrickson in office just 4 days ago when he was doing good at that time.  It was noted that he saw that his stools at that time was slightly red in color but no overt blood, melena or hematochezia.  Patient denies for me any active bright red blood per rectum or significant melena.  Of note, he is on iron supplementation.  He has no chest pain, no shortness of breath.  He denies any fever or chills.  He denies any abdominal pain.  He just had 3.75 L removed in the interventional radiology and this was followed by albumin administration.  He denies any lightheadedness or dizziness.  Is also been complaining of worsening lower extremity swelling after he his diuretics were stopped last week when he was discharged, this was in light of his kidney disease.  ED Course: In the emergency room his blood pressure is soft, 62B-638 systolic, he is not tachycardic, he is satting well on room air.  His blood work reveals hyponatremia with a sodium of 127, creatinine of 2.43, with a BUN of 33.  His hemoglobin was down at 5.5.  His platelets are 93.  He was ordered to be transfused 2 units of packed red blood cells and we were asked to admit.  EDP consulted gastroenterology who will see patient on 10/6 in the morning  Hospital Course:  Active Problems:   Upper GI bleed   Coagulopathy (  [place])   Acute kidney injury ([place])   Thrombocytopenia ([place])   Iron deficiency anemia due to chronic blood loss   Portal hypertensive gastropathy ([facility])   End stage liver disease ([facility])   Alcoholic cirrhosis of liver with ascites ([facility])   Symptomatic anemia   Ascites due to alcoholic cirrhosis ([facility])   Decompensation of cirrhosis of liver ([facility])   Hypotension   Acute upper GI bleed   GI bleed  Symptomatic anemia -Concern for upper GI  bleed, patient with known portal gastropathy, gastroenterology consulted, discussed over the phone with Azucena Freed, PA, they will see in consultation tomorrow morning.  For now place patient on Protonix, ceftriaxone, transfuse 2 units of packed red blood cells to bring hemoglobin above 7.  His bleed seems to be chronic in nature and no overt bleeding is clinically apparent at this point. -hgb increase from 5.5 to 7.1 after two units of prbc. Patient denies chest pain, no sob, no dizziness. He is cleared to discharge home by gi. He usually gets prbc transfusion and paracentesis regularly on outpatient basis.  End-stage liver disease in the setting of alcoholic cirrhosis with ascites and prior history of hepatic encephalopathy -Patient currently being evaluated for liver transplantation, his MELD score on admission is 35 confirming a very poor prognosis -Continue home medications of lactulose -She he is status post paracentesis with 3.75 L removed followed by albumin administration  Acute kidney injury with underlying chronic kidney disease stage III -Patient with worsening creatinine of 2.43, likely multifactorial due to repeat paracentesis and concern for hepatorenal syndrome as well as anemia -ua no acute findings -his diuretics have been on hold since his last hospital stay.  He will receive blood transfusions which will correct anemia and provide some intravascular volume,  -repeat cr improved to 2.08 at discharge. -his diuretics remain on hold at discharge. Gi will follow up with patient next week to decide diuretics   Hyponatremia -Likely in the setting of fluid overload, asymptomatic, -diuretic held.  Hypotension -Patient's blood pressures chronically running low, he is asymptomatic with this  Thrombocytopenia -In the setting of liver disease, monitor CBC  Iron deficiency anemia due to chronic GI blood loss -Continue home iron supplementation   DVT prophylaxis: SCDs    Code Status: Full code  Family Communication: wife at bedside Disposition Plan: home Consults called: GI    Procedures:  prbc transfusion  paracentesis   Discharge Exam: BP (!) 112/58 (BP Location: Left Arm)   Pulse 76   Temp 98.4 F (36.9 C) (Oral)   Resp 16   Ht 5' 4.5" (1.638 m)   Wt 81.6 kg (180 lb)   SpO2 95%   BMI 30.42 kg/m   General: NAD Cardiovascular: RRR Respiratory: abdomen less distended, nontender, soft, +bs Extremity: less edema  Discharge Instructions You were cared for by a hospitalist during your hospital stay. If you have any questions about your discharge medications or the care you received while you were in the hospital after you are discharged, you can call the unit and asked to speak with the hospitalist on call if the hospitalist that took care of you is not available. Once you are discharged, your primary care physician will handle any further medical issues. Please note that NO REFILLS for any discharge medications will be authorized once you are discharged, as it is imperative that you return to your primary care physician (or establish a relationship with a primary care physician if you do not have one) for your  aftercare needs so that they can reassess your need for medications and monitor your lab values.  Discharge Instructions    Diet - low sodium heart healthy    Complete by:  As directed    Increase activity slowly    Complete by:  As directed      Allergies as of 09/15/2017      Reactions   Xifaxan [rifaximin] Swelling, Other (See Comments)   Reaction:  All over body swelling       Medication List    STOP taking these medications   furosemide 20 MG tablet Commonly known as:  LASIX   spironolactone 100 MG tablet Commonly known as:  ALDACTONE     TAKE these medications   benzonatate 100 MG capsule Commonly known as:  TESSALON Take 1 capsule (100 mg total) by mouth 2 (two) times daily. What changed:  when to take  this  reasons to take this   bismuth subsalicylate 425 MG chewable tablet Commonly known as:  PEPTO BISMOL Chew 262-524 mg by mouth 3 (three) times daily as needed for indigestion.   calcium carbonate 750 MG chewable tablet Commonly known as:  TUMS EX Chew 1-2 tablets by mouth 3 (three) times daily as needed for heartburn.   calcium-vitamin D 500-200 MG-UNIT tablet Commonly known as:  OSCAL WITH D Take 1 tablet by mouth 2 (two) times daily.   ciprofloxacin 500 MG tablet Commonly known as:  CIPRO TAKE 1 TABLET BY MOUTH EVERY EVENING   ergocalciferol 50000 units capsule Commonly known as:  VITAMIN D2 Take 50,000 Units by mouth once a week.   ferrous sulfate 325 (65 FE) MG tablet Take 1 tablet (325 mg total) by mouth 3 (three) times daily with meals. What changed:  when to take this   folic acid 1 MG tablet Commonly known as:  FOLVITE TAKE 1 TABLET BY MOUTH AT BEDTIME   lactulose 20 g packet Commonly known as:  CEPHULAC Take 1 packet (20 g total) by mouth 3 (three) times daily. What changed:  when to take this   magnesium oxide 400 (241.3 Mg) MG tablet Commonly known as:  MAG-OX Take 0.5 tablets (200 mg total) by mouth 2 (two) times daily.   pantoprazole 40 MG tablet Commonly known as:  PROTONIX TAKE 1 TABLET (40 MG TOTAL) BY MOUTH 2 (TWO) TIMES DAILY.   sucralfate 1 g tablet Commonly known as:  CARAFATE TAKE 1 TABLET BY MOUTH 4 TIMES DAILY   thiamine 100 MG tablet Take 1 tablet (100 mg total) by mouth daily.   TUSSIN COUGH PO Take 2 capsules by mouth at bedtime.      Allergies  Allergen Reactions  . Xifaxan [Rifaximin] Swelling and Other (See Comments)    Reaction:  All over body swelling    Follow-up Information    Mitchell, L.Marlou Sa, MD Follow up.   Specialty:  Family Medicine Contact information: [address] [place] [address] [phone]            The results of significant diagnostics from this hospitalization  (including imaging, microbiology, ancillary and laboratory) are listed below for reference.    Significant Diagnostic Studies: Dg Chest 1 View  Result Date: 09/05/2017 CLINICAL DATA:  Post thoracentesis EXAM: CHEST 1 VIEW COMPARISON:  09/04/2017, 08/24/2017 FINDINGS: Decreased right pleural effusion post thoracentesis. No pneumothorax. Moderate residual effusion noted. Suspect tiny left effusion. Low lung volumes. Enlarged cardiomediastinal silhouette with central vascular congestion. Atelectasis or infiltrate at the right middle  lobe and right lung base. IMPRESSION: 1. Decreased right pleural effusion post thoracentesis with no pneumothorax. Moderate residual right pleural effusion noted 2. Consolidation in the right middle lobe and right lung base which may reflect atelectasis or pneumonia 3. Cardiomegaly with central vascular congestion. Electronically Signed   By: Donavan Foil M.D.   On: 09/05/2017 15:59   Dg Chest 1 View  Result Date: 08/24/2017 CLINICAL DATA:  Status post right thoracentesis. EXAM: CHEST 1 VIEW COMPARISON:  08/23/2017 FINDINGS: The heart size and mediastinal contours are within normal limits. Significantly less right pleural fluid after thoracentesis. No pneumothorax. No edema. The heart size and mediastinal contours are within normal limits. The visualized skeletal structures are unremarkable. IMPRESSION: Decrease in right pleural fluid after thoracentesis. No pneumothorax. Electronically Signed   By: Aletta Edouard M.D.   On: 08/24/2017 17:43   Dg Chest 2 View  Result Date: 08/23/2017 CLINICAL DATA:  Cirrhosis.  Confusion. EXAM: CHEST  2 VIEW COMPARISON:  06/28/2017.  06/14/2017.  03/27/2017. FINDINGS: Mediastinum and hilar structures normal. Cardiomegaly with normal pulmonary vascularity . Atelectasis right lower lobe. Prominent right pleural effusion again note. Left lung clear. No acute bony abnormality. IMPRESSION: 1. Right lower lobe atelectasis and consolidation.  Prominent right pleural effusion again noted. 2. Cardiomegaly. Electronically Signed   By: Marcello Moores  Register   On: 08/23/2017 12:48   US Paracentesis  Result Date: 08/25/2017 INDICATION: Alcoholic cirrhosis, ascites. Request for diagnostic and therapeutic right paracentesis. EXAM: ULTRASOUND GUIDED LEFT LATERAL ABDOMEN PARACENTESIS MEDICATIONS: 1% Lidocaine with epinephrine = 10 mL COMPLICATIONS: None immediate. PROCEDURE: Informed written consent was obtained from the patient after a discussion of the risks, benefits and alternatives to treatment. A timeout was performed prior to the initiation of the procedure. Initial ultrasound scanning demonstrates a moderate amount of ascites within the left lateral abdomen. The left lateral abdomen was prepped and draped in the usual sterile fashion. 1% lidocaine with epinephrine was used for local anesthesia. Following this, a 19 gauge, 7-cm, Yueh catheter was introduced. An ultrasound image was saved for documentation purposes. The paracentesis was performed. The catheter was removed and a dressing was applied. The patient tolerated the procedure well without immediate post procedural complication. FINDINGS: A total of approximately 2.8 liters of clear yellow fluid was removed. Samples were sent to the laboratory as requested by the clinical team. IMPRESSION: Successful ultrasound-guided paracentesis yielding 2.8 liters of peritoneal fluid. Read by:  Gareth Eagle, PA-C Electronically Signed   By: Corrie Mckusick D.O.   On: 08/25/2017 13:22   Dg Abd Acute W/chest  Result Date: 09/04/2017 CLINICAL DATA:  Initial evaluation for acute cough, nausea and vomiting. EXAM: DG ABDOMEN ACUTE W/ 1V CHEST COMPARISON:  Prior radiograph from 08/24/2017. FINDINGS: Mild cardiomegaly, stable. Mediastinal silhouette within normal limits. There has been reaccumulation of a right pleural effusion, now large in size, extending superiorly over the right lung apex. Associated volume  loss/atelectasis within the right lung. Left lung is grossly clear. Underlying pulmonary vascular congestion without frank pulmonary edema. No pneumothorax. Visualized bowel gas pattern within normal limits without evidence for obstruction or ileus. No appreciable abnormal bowel wall thickening. No free air seen on decubitus view. Calcified gallstone overlies the right upper quadrant. Additional calcific density overlying the left sacrum of uncertain etiology, but may reflect a calcified diverticulum. No acute osseous abnormality. IMPRESSION: 1. Re-accumulation of a large right pleural effusion with associated right lung volume loss. 2. Underlying pulmonary vascular congestion without frank pulmonary edema. 3. Nonobstructive bowel gas  pattern. No radiographic evidence for acute intra-abdominal pathology. 4. Cholelithiasis. Electronically Signed   By: Jeannine Boga M.D.   On: 09/04/2017 22:36   Ir Paracentesis  Result Date: 09/14/2017 INDICATION: Abdominal distention secondary recurrent ascites. Request for diagnostic and therapeutic paracentesis. EXAM: ULTRASOUND GUIDED LEFT LOWER QUADRANT PARACENTESIS MEDICATIONS: None. COMPLICATIONS: None immediate. PROCEDURE: Informed written consent was obtained from the patient after a discussion of the risks, benefits and alternatives to treatment. A timeout was performed prior to the initiation of the procedure. Initial ultrasound scanning demonstrates a large amount of ascites within the the left lower abdominal quadrant. The left lower abdomen was prepped and draped in the usual sterile fashion. 1% lidocaine with epinephrine was used for local anesthesia. Following this, a 19 gauge, 7-cm, Yueh catheter was introduced. An ultrasound image was saved for documentation purposes. The paracentesis was performed. The catheter was removed and a dressing was applied. The patient tolerated the procedure well without immediate post procedural complication. FINDINGS: A total  of approximately 3.75 L of clear yellow fluid was removed. Samples were sent to the laboratory as requested by the clinical team. IMPRESSION: Successful ultrasound-guided paracentesis yielding 3.75 liters of peritoneal fluid. Read by: Ascencion Dike PA-C Electronically Signed   By: Jerilynn Mages.  Shick M.D.   On: 09/14/2017 15:04   US Thoracentesis Asp Pleural Space W/img Guide  Result Date: 09/05/2017 INDICATION: Cirrhosis, encephalopathy, hepatic hydrothorax, renal insufficiency; request made for diagnostic and therapeutic right thoracentesis. EXAM: ULTRASOUND GUIDED DIAGNOSTIC AND THERAPEUTIC RIGHT THORACENTESIS MEDICATIONS: None. COMPLICATIONS: None immediate. PROCEDURE: An ultrasound guided thoracentesis was thoroughly discussed with the patient and questions answered. The benefits, risks, alternatives and complications were also discussed. The patient understands and wishes to proceed with the procedure. Written consent was obtained. Ultrasound was performed to localize and Brentyn an adequate pocket of fluid in the right chest. The area was then prepped and draped in the normal sterile fashion. 2% Lidocaine was used for local anesthesia. Under ultrasound guidance a Safe-T-Centesis catheter was introduced. Thoracentesis was performed. The catheter was removed and a dressing applied. FINDINGS: A total of approximately 1.1 liters of hazy, amber fluid was removed. Samples were sent to the laboratory as requested by the clinical team. Due to onset of patient coughing and hypotension only the above amount of fluid was removed today. IMPRESSION: Successful ultrasound guided diagnostic and therapeutic right thoracentesis yielding 1.1 liters of pleural fluid. Read by: Rowe Robert, PA-C Electronically Signed   By: Jacqulynn Cadet M.D.   On: 09/05/2017 16:08   US Thoracentesis Asp Pleural Space W/img Guide  Result Date: 08/24/2017 INDICATION: 56 year old male with a right-sided hepatic hydrothorax. EXAM: ULTRASOUND GUIDED  RIGHT THORACENTESIS MEDICATIONS: None. COMPLICATIONS: None immediate. PROCEDURE: An ultrasound guided thoracentesis was thoroughly discussed with the patient and questions answered. The benefits, risks, alternatives and complications were also discussed. The patient understands and wishes to proceed with the procedure. Written consent was obtained. Ultrasound was performed to localize and Hanz an adequate pocket of fluid in the right chest. The area was then prepped and draped in the normal sterile fashion. 1% Lidocaine was used for local anesthesia. Under ultrasound guidance a 19 gauge, 7-cm, Yueh catheter was introduced. Thoracentesis was performed. The catheter was removed and a dressing applied. FINDINGS: A total of approximately 800 mL of hazy amber colored fluid was removed. Samples were sent to the laboratory as requested by the clinical team. IMPRESSION: Successful ultrasound guided right thoracentesis yielding 800 mL of pleural fluid. Electronically Signed   By: Myrle Sheng  Laurence Ferrari M.D.   On: 08/24/2017 17:40    Microbiology: Recent Results (from the past 240 hour(s))  Culture, body fluid-bottle     Status: None   Collection Time: 09/05/17  3:12 PM  Result Value Ref Range Status   Specimen Description FLUID RIGHT PLEURAL  Final   Special Requests BOTTLES DRAWN AEROBIC AND ANAEROBIC 10CC  Final   Culture   Final    NO GROWTH 5 DAYS Performed at [facility], [place] [address] [place], [place] [address]    Report Status 09/10/2017 FINAL  Final  Gram stain     Status: None   Collection Time: 09/05/17  3:12 PM  Result Value Ref Range Status   Specimen Description FLUID RIGHT PLEURAL  Final   Special Requests NONE  Final   Gram Stain   Final    ABUNDANT WBC PRESENT, PREDOMINANTLY MONONUCLEAR NO ORGANISMS SEEN Performed at [facility], [place] [address] [place], [place] [address]    Report Status 09/05/2017 FINAL  Final     Labs: Basic Metabolic Panel:  Recent Labs Lab  09/10/17 1431 09/14/17 1350 09/15/17 0056  NA 129* 127* 128*  K 3.9 4.5 3.8  CL 98 96* 96*  CO2 24 23 22   GLUCOSE 142* 128* 103*  BUN 22 33* 34*  CREATININE 1.50 2.43* 2.08*  CALCIUM 8.3* 8.3* 8.6*   Liver Function Tests:  Recent Labs Lab 09/10/17 1407 09/14/17 1350 09/15/17 0056  AST 57* 75* 48*  ALT 31 34 27  ALKPHOS 103 80 67  BILITOT 5.8* 6.4* 9.2*  PROT 5.5* 5.3* 5.7*  ALBUMIN 2.3* 2.1* 3.2*   No results for input(s): LIPASE, AMYLASE in the last 168 hours. No results for input(s): AMMONIA in the last 168 hours. CBC:  Recent Labs Lab 09/10/17 1431 09/14/17 1350 09/15/17 0056  WBC 6.3 6.8 6.3  NEUTROABS 4.6 4.7  --   HGB 7.9 Repeated and verified X2.* 5.5* 7.1*  HCT 23.2 Repeated and verified X2.* 16.5* 20.0*  MCV 103.1* 100.0 94.8  PLT 66.0* 93* 78*   Cardiac Enzymes: No results for input(s): CKTOTAL, CKMB, CKMBINDEX, TROPONINI in the last 168 hours. BNP: BNP (last 3 results)  Recent Labs  03/27/17 2051 06/13/17 1856  BNP 106.7* 79.3    ProBNP (last 3 results) No results for input(s): PROBNP in the last 8760 hours.  CBG: No results for input(s): GLUCAP in the last 168 hours.     SignedFlorencia Reasons MD, PhD  [facility] 09/15/2017, 11:07 AM

## 2017-09-15 NOTE — Consult Note (Signed)
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 He has hx of SBP on chronic prophylaxis now. He has chronic GI blood loss from portal gastropathy and has required frequent blood transfusions. Followed closely by [facility], next appt 10/17. ETOH in remission. Currently readmitted with recurrent decline in hgb. From 7.9 to 5.5. Drop in hgb not associated with any increase in his chronic GI blood loss. Additionally he anemia is asymptomatic. No chest pain or SOB. He is chronically tired.  -Post 2 units of blood his hgb is up to 7.1. He can be discharged today. We are following him closely with weekly labs and LVP as needed. He had 3.75 L removed yesteterday.  No diuretics at discharge, just 2 gram sodium diet.    Attending physician's note   I have taken a history, examined the patient and reviewed the chart. I agree with the Advanced Practitioner's note, impression and recommendations. He feels better after LVP and blood transfusions. Follow up with [facility], [facility] and Dr. Alroy Dust that is already scheduled. No change in medications or follow up plans. OK for discharge today from out standpoint.    Lucio Edward, MD [facility] [phone] Mon-Fri 8a-5p [phone] after 5p, weekends, holidays   HPI: Thomas [name] is a 56 y.o. male followed by Dr. Hilarie Fredrickson for ETOH cirrhosis complicated by portal HTN with ascites, hepatic hydrothorax, PSE, and his SBP. He has had several admissions for recurrent anemia and AKI . He was hospitalized several days ago with decompensation. He has a liter of pleural fluid removed.  We saw him on 10/1 for hospital follow up. His diuretics were stopped because  of ongoing renal failure. Therapeutic paracentesis was ordered,  he had yesterday. No evidence for SBP. He is on chronic antibiotics for SBP prophylaxis. Routine labs yesterday revealed hgb of 5.5, down from 7.9. He was sent to ED where two units of blood given, hgb now 7.1. He has chronic, intermittent "cheerwine" colored stools. No recent increase in his chronic GI blood loss from portal gastropathy. He was given two units of blood in [place] a few weeks ago for another drop in hgb. He is generally asymptomatic when hgb declines. His  last EGD in May showed trace esophageal varices / portal gastropathy.  Thomas [name] is undergoing transplant evaluation at [facility] and next appt there is 10/17. ETOH abuse in remission, will soon have 4 of the 6 mandatory visits with Psychologist completed.    Past Medical History:  Diagnosis Date  . Alcohol abuse 2013  . Alcoholic cirrhosis ([place]) 08/3809  . Anemia 02/2016   in setting of GI blood loss, but MCV macrocytic.   . Coagulopathy ([place]) 05/2015  . Diverticulosis 09/2015  . Esophageal varices ([place]) 09/2015   small  . GERD (gastroesophageal reflux disease)   . Hyperlipidemia   . Hypertension   . Pneumonia   . Portal hypertension ([place]) 09/2015   with portal gastropathy  . Thrombocytopenia ([place]) 02/2016  . Tubular adenoma of colon 09/2015    Past Surgical History:  Procedure Laterality Date  . ESOPHAGOGASTRODUODENOSCOPY N/A 03/05/2016   Procedure: ESOPHAGOGASTRODUODENOSCOPY (EGD);  Surgeon: Ladene Artist, MD;  Location: [name];  Service: Endoscopy;  Laterality: N/A;  .  ESOPHAGOGASTRODUODENOSCOPY N/A 04/29/2017   Procedure: ESOPHAGOGASTRODUODENOSCOPY (EGD);  Surgeon: Milus Banister, MD;  Location: [name];  Service: Endoscopy;  Laterality: N/A;  . ESOPHAGOGASTRODUODENOSCOPY (EGD) WITH PROPOFOL N/A 03/29/2017   Procedure: ESOPHAGOGASTRODUODENOSCOPY (EGD) WITH PROPOFOL;  Surgeon: Doran Stabler, MD;  Location: [facility];  Service: Endoscopy;   Laterality: N/A;  . IR PARACENTESIS  05/15/2017  . IR PARACENTESIS  06/11/2017  . IR PARACENTESIS  06/26/2017  . IR PARACENTESIS  07/10/2017  . IR PARACENTESIS  08/16/2017  . IR PARACENTESIS  09/14/2017  . IR THORACENTESIS ASP PLEURAL SPACE W/IMG GUIDE  06/28/2017  . testicle prosthesis      Prior to Admission medications   Medication Sig Start Date End Date Taking? Authorizing Provider  benzonatate (TESSALON) 100 MG capsule Take 1 capsule (100 mg total) by mouth 2 (two) times daily. Patient taking differently: Take 100 mg by mouth 2 (two) times daily as needed for cough.  09/07/17  Yes Robbie Lis, MD  bismuth subsalicylate (PEPTO BISMOL) 262 MG chewable tablet Chew 262-524 mg by mouth 3 (three) times daily as needed for indigestion.   Yes [provider]  calcium-vitamin D (OSCAL WITH D) 500-200 MG-UNIT tablet Take 1 tablet by mouth 2 (two) times daily.   Yes [provider]  ciprofloxacin (CIPRO) 500 MG tablet TAKE 1 TABLET BY MOUTH EVERY EVENING 08/20/17  Yes Pyrtle, Lajuan Lines, MD  Dextromethorphan HBr (TUSSIN COUGH PO) Take 2 capsules by mouth at bedtime.    Yes [provider]  ferrous sulfate 325 (65 FE) MG tablet Take 1 tablet (325 mg total) by mouth 3 (three) times daily with meals. Patient taking differently: Take 325 mg by mouth every evening.  04/30/17  Yes Patrecia Pour, Christean Grief, MD  folic acid (FOLVITE) 1 MG tablet TAKE 1 TABLET BY MOUTH AT BEDTIME 08/20/17  Yes Pyrtle, Lajuan Lines, MD  furosemide (LASIX) 20 MG tablet Take 40 mg by mouth 2 (two) times daily. 08/18/17  Yes [provider]  lactulose (CEPHULAC) 20 g packet Take 1 packet (20 g total) by mouth 3 (three) times daily. Patient taking differently: Take 20 g by mouth 2 (two) times daily.  08/25/17  Yes Regalado, Belkys A, MD  magnesium oxide (MAG-OX) 400 (241.3 Mg) MG tablet Take 0.5 tablets (200 mg total) by mouth 2 (two) times daily. 08/25/17  Yes Regalado, Belkys A, MD  pantoprazole (PROTONIX) 40 MG tablet  TAKE 1 TABLET (40 MG TOTAL) BY MOUTH 2 (TWO) TIMES DAILY. 07/06/17  Yes Pyrtle, Lajuan Lines, MD  spironolactone (ALDACTONE) 100 MG tablet Take 150 mg by mouth daily.   Yes [provider]  sucralfate (CARAFATE) 1 g tablet TAKE 1 TABLET BY MOUTH 4 TIMES DAILY 08/23/17  Yes Pyrtle, Lajuan Lines, MD  thiamine 100 MG tablet Take 1 tablet (100 mg total) by mouth daily. 06/30/17  Yes Robbie Lis, MD  calcium carbonate (TUMS EX) 750 MG chewable tablet Chew 1-2 tablets by mouth 3 (three) times daily as needed for heartburn.    [provider]  ergocalciferol (VITAMIN D2) 50000 units capsule Take 50,000 Units by mouth once a week.    [provider]    Current Facility-Administered Medications  Medication Dose Route Frequency Provider Last Rate Last Dose  . cefTRIAXone (ROCEPHIN) 2 g in dextrose 5 % 50 mL IVPB  2 g Intravenous Q24H Gherghe, Costin M, MD      . ferrous sulfate tablet 325 mg  325 mg Oral QPM Gherghe,  Vella Redhead, MD   325 mg at 09/14/17 2206  . lactulose (CHRONULAC) 10 GM/15ML solution 20 g  20 g Oral TID Caren Griffins, MD   20 g at 09/14/17 2205  . magnesium oxide (MAG-OX) tablet 200 mg  200 mg Oral BID Caren Griffins, MD   200 mg at 09/14/17 2206  . pantoprazole (PROTONIX) injection 40 mg  40 mg Intravenous Q12H Deno Etienne, DO   40 mg at 09/14/17 2206    Allergies as of 09/14/2017 - Review Complete 09/14/2017  Allergen Reaction Noted  . Xifaxan [rifaximin] Swelling and Other (See Comments) 11/25/2016    Family History  Problem Relation Age of Onset  . Diabetes Father   . Parkinson's disease Father   . Diabetes Paternal Aunt   . Glaucoma Mother     Social History   Social History  . Marital status: Married    Spouse name: Lelan Pons  . Number of children: 3  . Years of education: N/A   Occupational History  . Sales    Social History Main Topics  . Smoking status: Never Smoker  . Smokeless tobacco: Never Used  . Alcohol use No     Comment: Quit 2/17,  relapse in 1/18 for short period of time  . Drug use: No  . Sexual activity: Not Currently   Other Topics Concern  . Not on file   Social History Narrative   Lives with wife.  Normally able to ambulate without assistance.    Review of Systems: All systems reviewed and negative except where noted in HPI.  Physical Exam: Vital signs in last 24 hours: Temp:  [97.8 F (36.6 C)-98.8 F (37.1 C)] 98.3 F (36.8 C) (10/06 0536) Pulse Rate:  [76-86] 77 (10/06 0536) Resp:  [16-20] 18 (10/06 0536) BP: (90-114)/(44-77) 112/51 (10/06 0536) SpO2:  [91 %-100 %] 96 % (10/06 0536) Weight:  [180 lb (81.6 kg)] 180 lb (81.6 kg) (10/05 1559) Last BM Date: 09/15/17 General:   Alert, white male in NAD Psych:  Pleasant, cooperative. Normal mood and affect. Eyes:  Pupils equal, sclera icteric. Ears:  Normal auditory acuity. Nose:  No deformity, discharge,  or lesions. Neck:  Supple; no masses Lungs:  Decreased breath sounds in right lung. No wheezes, crackles, or rhonchi.  Heart:  Regular rate and rhythm;  BLE 1+ edema Abdomen:  Soft, moderately distended, nontender, BS active   Rectal:  Deferred  Msk:  Symmetrical without gross deformities. . Pulses:  Normal pulses noted. Neurologic:  Alert and  oriented x4;  grossly normal neurologically. Skin:  Intact without significant lesions or rashes..   Intake/Output from previous day: 10/05 0701 - 10/06 0700 In: 730 [I.V.:50; Blood:630; IV Piggyback:50] Out: -  Intake/Output this shift: No intake/output data recorded.  Lab Results:  Recent Labs  09/14/17 1350 09/15/17 0056  WBC 6.8 6.3  HGB 5.5* 7.1*  HCT 16.5* 20.0*  PLT 93* 78*   BMET  Recent Labs  09/14/17 1350 09/15/17 0056  NA 127* 128*  K 4.5 3.8  CL 96* 96*  CO2 23 22  GLUCOSE 128* 103*  BUN 33* 34*  CREATININE 2.43* 2.08*  CALCIUM 8.3* 8.6*   LFT  Recent Labs  09/15/17 0056  PROT 5.7*  ALBUMIN 3.2*  AST 48*  ALT 27  ALKPHOS 67  BILITOT 9.2*    PT/INR  Recent Labs  09/14/17 1350 09/15/17 0056  LABPROT 27.5* 28.8*  INR 2.58 2.74   Hepatitis Panel No results for input(s):  HEPBSAG, HCVAB, HEPAIGM, HEPBIGM in the last 72 hours.   Studies/Results: Ir Paracentesis  Result Date: 09/14/2017 INDICATION: Abdominal distention secondary recurrent ascites. Request for diagnostic and therapeutic paracentesis. EXAM: ULTRASOUND GUIDED LEFT LOWER QUADRANT PARACENTESIS MEDICATIONS: None. COMPLICATIONS: None immediate. PROCEDURE: Informed written consent was obtained from the patient after a discussion of the risks, benefits and alternatives to treatment. A timeout was performed prior to the initiation of the procedure. Initial ultrasound scanning demonstrates a large amount of ascites within the the left lower abdominal quadrant. The left lower abdomen was prepped and draped in the usual sterile fashion. 1% lidocaine with epinephrine was used for local anesthesia. Following this, a 19 gauge, 7-cm, Yueh catheter was introduced. An ultrasound image was saved for documentation purposes. The paracentesis was performed. The catheter was removed and a dressing was applied. The patient tolerated the procedure well without immediate post procedural complication. FINDINGS: A total of approximately 3.75 L of clear yellow fluid was removed. Samples were sent to the laboratory as requested by the clinical team. IMPRESSION: Successful ultrasound-guided paracentesis yielding 3.75 liters of peritoneal fluid. Read by: Ascencion Dike PA-C Electronically Signed   By: Jerilynn Mages.  Shick M.D.   On: 09/14/2017 15:04     [name], NP-C @  09/15/2017, 9:28 AM  Pager number [phone]

## 2017-09-17 DIAGNOSIS — K3189 Other diseases of stomach and duodenum: Secondary | ICD-10-CM | POA: Diagnosis not present

## 2017-09-17 LAB — PATHOLOGIST SMEAR REVIEW

## 2017-09-20 ENCOUNTER — Other Ambulatory Visit (INDEPENDENT_AMBULATORY_CARE_PROVIDER_SITE_OTHER): Payer: BLUE CROSS/BLUE SHIELD

## 2017-09-20 ENCOUNTER — Encounter: Payer: Self-pay | Admitting: Physician Assistant

## 2017-09-20 ENCOUNTER — Ambulatory Visit (INDEPENDENT_AMBULATORY_CARE_PROVIDER_SITE_OTHER): Payer: BLUE CROSS/BLUE SHIELD | Admitting: Physician Assistant

## 2017-09-20 ENCOUNTER — Other Ambulatory Visit: Payer: Self-pay | Admitting: *Deleted

## 2017-09-20 VITALS — BP 110/56 | HR 68 | Ht 65.0 in | Wt 177.4 lb

## 2017-09-20 DIAGNOSIS — D500 Iron deficiency anemia secondary to blood loss (chronic): Secondary | ICD-10-CM

## 2017-09-20 DIAGNOSIS — K7031 Alcoholic cirrhosis of liver with ascites: Secondary | ICD-10-CM

## 2017-09-20 LAB — CBC WITH DIFFERENTIAL/PLATELET
Basophils Absolute: 0.1 10*3/uL (ref 0.0–0.1)
Basophils Relative: 1.8 % (ref 0.0–3.0)
EOS PCT: 4 % (ref 0.0–5.0)
Eosinophils Absolute: 0.2 10*3/uL (ref 0.0–0.7)
HCT: 23.3 % — CL (ref 39.0–52.0)
Hemoglobin: 7.9 g/dL — CL (ref 13.0–17.0)
LYMPHS ABS: 0.6 10*3/uL — AB (ref 0.7–4.0)
Lymphocytes Relative: 11.1 % — ABNORMAL LOW (ref 12.0–46.0)
MCHC: 33.9 g/dL (ref 30.0–36.0)
MCV: 105.5 fl — AB (ref 78.0–100.0)
MONOS PCT: 16.2 % — AB (ref 3.0–12.0)
Monocytes Absolute: 0.9 10*3/uL (ref 0.1–1.0)
NEUTROS ABS: 3.5 10*3/uL (ref 1.4–7.7)
NEUTROS PCT: 66.9 % (ref 43.0–77.0)
Platelets: 88 10*3/uL — ABNORMAL LOW (ref 150.0–400.0)
RBC: 2.2 Mil/uL — ABNORMAL LOW (ref 4.22–5.81)
RDW: 25.1 % — AB (ref 11.5–15.5)
WBC: 5.3 10*3/uL (ref 4.0–10.5)

## 2017-09-20 LAB — COMPREHENSIVE METABOLIC PANEL
ALK PHOS: 84 U/L (ref 39–117)
ALT: 28 U/L (ref 0–53)
AST: 52 U/L — ABNORMAL HIGH (ref 0–37)
Albumin: 2.6 g/dL — ABNORMAL LOW (ref 3.5–5.2)
BUN: 21 mg/dL (ref 6–23)
CO2: 25 mEq/L (ref 19–32)
Calcium: 8 mg/dL — ABNORMAL LOW (ref 8.4–10.5)
Chloride: 102 mEq/L (ref 96–112)
Creatinine, Ser: 1.72 mg/dL — ABNORMAL HIGH (ref 0.40–1.50)
GFR: 44.05 mL/min — AB (ref >60.00)
Glucose, Bld: 206 mg/dL — ABNORMAL HIGH (ref 70–99)
POTASSIUM: 4 meq/L (ref 3.5–5.1)
Sodium: 133 mEq/L — ABNORMAL LOW (ref 135–145)
Total Bilirubin: 6.9 mg/dL — ABNORMAL HIGH (ref 0.2–1.2)
Total Protein: 5.5 g/dL — ABNORMAL LOW (ref 6.0–8.3)

## 2017-09-20 LAB — PROTIME-INR
INR: 2.6 ratio — AB (ref 0.8–1.0)
PROTHROMBIN TIME: 27.8 s — AB (ref 9.6–13.1)

## 2017-09-20 LAB — AMMONIA: AMMONIA: 63 umol/L — AB (ref 11–35)

## 2017-09-20 LAB — MAGNESIUM: Magnesium: 2.2 mg/dL (ref 1.5–2.5)

## 2017-09-20 MED ORDER — THIAMINE HCL 100 MG PO TABS: 100.0000 mg | ORAL_TABLET | Freq: Every day | ORAL | 2 refills | Status: DC

## 2017-09-20 MED ORDER — MAGNESIUM OXIDE 400 (241.3 MG) MG PO TABS: ORAL_TABLET | ORAL | 2 refills | Status: DC

## 2017-09-20 NOTE — Patient Instructions (Signed)
Please go to the basement level to have your labs drawn.   We sent refills to your pharmacy : 1. Magnesium oxide 2. Thiamin ( Vit B)   You have been scheduled for an endoscopy at [facility] with Dr. Zenovia Jarred.Marland Kitchen Please follow written instructions given to you at your visit today.

## 2017-09-20 NOTE — Progress Notes (Addendum)
Subjective:    Patient ID: Thomas [name], male    DOB: August 02, 1961, 56 y.o.   MRN: [id number]  HPI Thomas [name] is a pleasant 56 year old white male, known to Dr. Hilarie Fredrickson with severely decompensated alcoholic cirrhosis which has been complicated by portal hypertension, ascites hepatic hydrothorax, severe portal gastropathy, recurrent severe anemia, hepatic encephalopathy and previous history of SBP He has had several recent hospitalizations, has required repeated paracenteses and intermittent thoracenteses. He has been evaluated at [facility] and is hoping to be listed for liver transplant. He has follow-up there nest week. He comes in today for office follow-up. He was hospitalized 10 5 through 09/15/2017 because of recurrent severe anemia with hemoglobin of 5.9 which is been presumed secondary to oozing from portal gastropathy. Last EGD was done in May 2018 with trace esophageal varices and diffuse portal gastropathy. He received 2 units of packed RBCs. We have had difficulty with tolerance of diuretics due to decompensation in kidney function. His last creatinine was 2 despite being off of diuretics over the past couple of weeks. He did have a paracentesis done one week ago with removal of 3.5 L. He feels that he has reaccumulated most of that. He has completed his third session of alcohol counseling, has his fourth session scheduled for early next week prior to returning to [facility]. Today he says he feels okay, denies any excessive weakness and lightheadedness shortness of breath. No nausea or vomiting. No fevers. He says his stool stayed dark on iron no overt melena. Most recent labs done on 09/15/2017 hemoglobin 7.1 hematocrit of 20 platelets 78 INR of 2.7 for urine sodium was less than 10., BUN 34/creatinine 2.08, T bili 9.2, alkaline phosphatase 67, AST 48, ALT of 27, albumin 3.2 and sodium 128 Peritoneal fluid from 09/14/2017 30 WBCs and 5 neutrophils. He has been on SBP prophylaxis..   Review of  Systems Pertinent positive and negative review of systems were noted in the above HPI section.  All other review of systems was otherwise negative.  Outpatient Encounter Prescriptions as of 09/20/2017  Medication Sig  . benzonatate (TESSALON) 100 MG capsule Take 1 capsule (100 mg total) by mouth 2 (two) times daily. (Patient taking differently: Take 100 mg by mouth 2 (two) times daily as needed for cough. )  . bismuth subsalicylate (PEPTO BISMOL) 262 MG chewable tablet Chew 262-524 mg by mouth 3 (three) times daily as needed for indigestion.  . calcium carbonate (TUMS EX) 750 MG chewable tablet Chew 1-2 tablets by mouth 3 (three) times daily as needed for heartburn.  . calcium-vitamin D (OSCAL WITH D) 500-200 MG-UNIT tablet Take 1 tablet by mouth 2 (two) times daily.  . ciprofloxacin (CIPRO) 500 MG tablet TAKE 1 TABLET BY MOUTH EVERY EVENING  . Dextromethorphan HBr (TUSSIN COUGH PO) Take 2 capsules by mouth at bedtime.   . ergocalciferol (VITAMIN D2) 50000 units capsule Take 50,000 Units by mouth once a week.  . ferrous sulfate 325 (65 FE) MG tablet Take 1 tablet (325 mg total) by mouth 3 (three) times daily with meals. (Patient taking differently: Take 325 mg by mouth every evening. )  . folic acid (FOLVITE) 1 MG tablet TAKE 1 TABLET BY MOUTH AT BEDTIME  . furosemide (LASIX) 20 MG tablet Take 40 mg by mouth 2 (two) times daily.  Marland Kitchen lactulose (CEPHULAC) 20 g packet Take 1 packet (20 g total) by mouth 3 (three) times daily. (Patient taking differently: Take 20 g by mouth 2 (two) times daily. )  .  magnesium oxide (MAG-OX) 400 (241.3 Mg) MG tablet Take 1/2 tablet twice daily.  . pantoprazole (PROTONIX) 40 MG tablet TAKE 1 TABLET (40 MG TOTAL) BY MOUTH 2 (TWO) TIMES DAILY.  Marland Kitchen spironolactone (ALDACTONE) 100 MG tablet Take 100 mg by mouth daily.  . sucralfate (CARAFATE) 1 g tablet TAKE 1 TABLET BY MOUTH 4 TIMES DAILY  . thiamine 100 MG tablet Take 1 tablet (100 mg total) by mouth daily.  .  [DISCONTINUED] magnesium oxide (MAG-OX) 400 (241.3 Mg) MG tablet Take 0.5 tablets (200 mg total) by mouth 2 (two) times daily.  . [DISCONTINUED] thiamine 100 MG tablet Take 1 tablet (100 mg total) by mouth daily.   No facility-administered encounter medications on file as of 09/20/2017.    Allergies  Allergen Reactions  . Xifaxan [Rifaximin] Swelling and Other (See Comments)    Reaction:  All over body swelling    Patient Active Problem List   Diagnosis Date Noted  . GI bleed 09/14/2017  . Acute hepatic encephalopathy 09/04/2017  . Acute upper GI bleed 06/13/2017  . HCAP (healthcare-associated pneumonia) 04/27/2017  . Acute renal failure (ARF) ([place]) 03/28/2017  . Hyperglycemia 03/28/2017  . Hypoalbuminemia 03/28/2017  . Edema 03/28/2017  . S/P thoracentesis   . Symptomatic anemia   . Dyspnea 03/03/2017  . Hypomagnesemia 11/26/2016  . Acute respiratory failure with hypoxia ([place]) 11/24/2016  . Pleural effusion associated with hepatic disorder 11/24/2016  . Hypokalemia 11/24/2016  . SBP (spontaneous bacterial peritonitis) ([place]) 05/09/2016  . Severe sepsis ([place]) 05/09/2016  . Decompensation of cirrhosis of liver ([place]) 05/07/2016  . Hypotension 05/07/2016  . Ascites due to alcoholic cirrhosis ([place]) 13/24/4010  . Localized edema 04/17/2016  . Alcoholic cirrhosis of liver with ascites ([place])   . End stage liver disease ([place])   . Upper GI bleed 03/05/2016  . Coagulopathy ([place]) 03/05/2016  . Acute kidney injury ([place]) 03/05/2016  . Hepatic encephalopathy ([place]) 03/05/2016  . Macrocytic anemia 03/05/2016  . Thrombocytopenia ([place]) 03/05/2016  . Esophageal varices ([place]) 03/05/2016  . Iron deficiency anemia due to chronic blood loss 03/05/2016  . Idiopathic esophageal varices without bleeding ([place])   . Portal hypertensive gastropathy [facility])    Social History   Social History  . Marital status: Married    Spouse name: Lelan Pons  . Number of children: 3  . Years of education: N/A    Occupational History  . Sales    Social History Main Topics  . Smoking status: Never Smoker  . Smokeless tobacco: Never Used  . Alcohol use No     Comment: Quit 2/17, relapse in 1/18 for short period of time  . Drug use: No  . Sexual activity: Not Currently   Other Topics Concern  . Not on file   Social History Narrative   Lives with wife.  Normally able to ambulate without assistance.    Mr. Fearnow family history includes Diabetes in his father and paternal aunt; Glaucoma in his mother; Parkinson's disease in his father.      Objective:    Vitals:   09/20/17 1033  BP: (!) 110/56  Pulse: 68    Physical Exam  well-developed white male in no acute distress, accompanied by his wife, both pleasant blood pressure 110/56 pulse 68, height 5 foot 5, weight 177, BMI 29.5. HEENT; nontraumatic normocephalic EOMI PERRLA sclera are anicteric cardiovascular regular rate and rhythm with S1-S2, Pulmonary ;somewhat decreased breath sounds right base, Abdomen; patient has ascites which is currently non-tense nontender bowel sounds are  present no palpable mass or hepatosplenomegaly, Extremities ;2+ pitting edema to the knees bilaterally, Neuropsych ;mood and affect appropriate, no asterixis and he is mentating well       Assessment & Plan:   #33 56 year old white male with severely decompensated alcoholic cirrhosis complicated by ascites, hepatic hydrothorax, hepatic encephalopathy, acute on chronic renal insufficiency question early hepatorenal, previous EtOH abuse which has been in remission over the past 8 months. Patient is requiring repeated hospitalizations, serial paracenteses and intermittent thoracenteses. Renal function will not allow for continuation of diuretics at this point, and we will plan paracenteses as needed. He has scheduled follow-up with [facility]/transplant service next week. He needs to be listed ASAP, he will complete 4 sessions of alcohol counseling by early  next week. #2 recurrent severe anemia requiring transfusions-felt secondary to portal gastropathy. He was required several units of blood over the past 3 weeks. #3 history of SBP on chronic prophylaxis with Cipro  Plan; CBC, CMET, INR, venous ammonia, EtOH level and magnesium today Patient will be transfused as needed for hemoglobin 6.5 or less Will schedule for EGD with Dr. Hilarie Fredrickson on November 6 at [facility] at 8:30 AM. Procedure discussed in detail with patient and his wife including risks and benefits and they're agreeable to proceed. They were advised that if [facility] wishes to do his endoscopy that is certainly fine and can cancel procedure here. Patient is advised to call should he develop increased symptoms from ascites, expect he will need another paracentesis in a week or so Continue 2 g sodium diet, discussed again with patient Stay off diuretics Patient is not currently on a beta blocker, will discuss with Dr. Hilarie Fredrickson question previous intolerance.   Markiesha Delia S Banner Huckaba PA-C 09/20/2017   Cc: Alroy Dust, L.Marlou Sa, MD   Addendum: Reviewed and agree with ongoing management. Pt remains critically ill. I agree that getting him listed for OLTx ASAP is paramount.  Hopefully he will be able to make follow-up with [facility] this week. NSBBs are not contraindicated with his ascites, but higher doses can worsen circulatory dysfunction and end-organ profusion That said, given his need for frequent transfusion, known small varices and severe portal HTN gastropathy, I favor starting low dose NSBB Would start propranolol 20 mg BID Agree with completely holding diuretics as his renal function has not returned to baseline and remains abnormal.  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  PRN LVP - always with IV alb when done.  PRN thoracentesis for hepatic hydrothorax Low Na diet He is not drinking Close follow-up here and at [facility] Repeat CBC next week. Pyrtle, Lajuan Lines, MD

## 2017-09-21 ENCOUNTER — Other Ambulatory Visit: Payer: Self-pay | Admitting: Internal Medicine

## 2017-09-21 ENCOUNTER — Ambulatory Visit (HOSPITAL_COMMUNITY): Payer: BLUE CROSS/BLUE SHIELD

## 2017-09-21 ENCOUNTER — Other Ambulatory Visit: Payer: Self-pay

## 2017-09-21 MED ORDER — PROPRANOLOL HCL 20 MG PO TABS: 20.0000 mg | ORAL_TABLET | Freq: Two times a day (BID) | ORAL | 6 refills | Status: DC

## 2017-09-21 NOTE — Progress Notes (Signed)
Script sent to pharmacy and record faxed to [facility].

## 2017-09-23 ENCOUNTER — Inpatient Hospital Stay (HOSPITAL_COMMUNITY): Payer: BLUE CROSS/BLUE SHIELD

## 2017-09-23 ENCOUNTER — Inpatient Hospital Stay (HOSPITAL_COMMUNITY)
Admission: EM | Admit: 2017-09-23 | Discharge: 2017-09-25 | DRG: 432 | Disposition: A | Discharge status: Other Acute Inpatient Hospital | Payer: BLUE CROSS/BLUE SHIELD | Attending: Internal Medicine | Admitting: Internal Medicine

## 2017-09-23 ENCOUNTER — Encounter (HOSPITAL_COMMUNITY): Payer: Self-pay

## 2017-09-23 ENCOUNTER — Emergency Department (HOSPITAL_COMMUNITY): Payer: BLUE CROSS/BLUE SHIELD

## 2017-09-23 DIAGNOSIS — K3189 Other diseases of stomach and duodenum: Secondary | ICD-10-CM

## 2017-09-23 DIAGNOSIS — N183 Chronic kidney disease, stage 3 (moderate): Secondary | ICD-10-CM | POA: Diagnosis present

## 2017-09-23 DIAGNOSIS — I129 Hypertensive chronic kidney disease with stage 1 through stage 4 chronic kidney disease, or unspecified chronic kidney disease: Secondary | ICD-10-CM | POA: Diagnosis present

## 2017-09-23 DIAGNOSIS — K7031 Alcoholic cirrhosis of liver with ascites: Secondary | ICD-10-CM | POA: Diagnosis present

## 2017-09-23 DIAGNOSIS — K767 Hepatorenal syndrome: Secondary | ICD-10-CM | POA: Diagnosis present

## 2017-09-23 DIAGNOSIS — D649 Anemia, unspecified: Secondary | ICD-10-CM

## 2017-09-23 DIAGNOSIS — K766 Portal hypertension: Secondary | ICD-10-CM | POA: Diagnosis not present

## 2017-09-23 DIAGNOSIS — K7290 Hepatic failure, unspecified without coma: Secondary | ICD-10-CM | POA: Diagnosis not present

## 2017-09-23 DIAGNOSIS — I4581 Long QT syndrome: Secondary | ICD-10-CM | POA: Diagnosis present

## 2017-09-23 DIAGNOSIS — K219 Gastro-esophageal reflux disease without esophagitis: Secondary | ICD-10-CM | POA: Diagnosis present

## 2017-09-23 DIAGNOSIS — Z833 Family history of diabetes mellitus: Secondary | ICD-10-CM

## 2017-09-23 DIAGNOSIS — J90 Pleural effusion, not elsewhere classified: Secondary | ICD-10-CM

## 2017-09-23 DIAGNOSIS — F1021 Alcohol dependence, in remission: Secondary | ICD-10-CM | POA: Diagnosis not present

## 2017-09-23 DIAGNOSIS — J918 Pleural effusion in other conditions classified elsewhere: Secondary | ICD-10-CM | POA: Diagnosis present

## 2017-09-23 DIAGNOSIS — Z83511 Family history of glaucoma: Secondary | ICD-10-CM

## 2017-09-23 DIAGNOSIS — Z79899 Other long term (current) drug therapy: Secondary | ICD-10-CM | POA: Diagnosis not present

## 2017-09-23 DIAGNOSIS — N179 Acute kidney failure, unspecified: Secondary | ICD-10-CM | POA: Diagnosis not present

## 2017-09-23 DIAGNOSIS — I9589 Other hypotension: Secondary | ICD-10-CM | POA: Diagnosis present

## 2017-09-23 DIAGNOSIS — D696 Thrombocytopenia, unspecified: Secondary | ICD-10-CM | POA: Diagnosis not present

## 2017-09-23 DIAGNOSIS — R918 Other nonspecific abnormal finding of lung field: Secondary | ICD-10-CM | POA: Diagnosis not present

## 2017-09-23 DIAGNOSIS — Z820 Family history of epilepsy and other diseases of the nervous system: Secondary | ICD-10-CM

## 2017-09-23 DIAGNOSIS — D689 Coagulation defect, unspecified: Secondary | ICD-10-CM | POA: Diagnosis not present

## 2017-09-23 DIAGNOSIS — K7682 Hepatic encephalopathy: Secondary | ICD-10-CM | POA: Diagnosis present

## 2017-09-23 DIAGNOSIS — K7030 Alcoholic cirrhosis of liver without ascites: Secondary | ICD-10-CM | POA: Diagnosis not present

## 2017-09-23 DIAGNOSIS — K7210 Chronic hepatic failure without coma: Secondary | ICD-10-CM | POA: Diagnosis present

## 2017-09-23 DIAGNOSIS — R140 Abdominal distension (gaseous): Secondary | ICD-10-CM | POA: Diagnosis not present

## 2017-09-23 DIAGNOSIS — R4182 Altered mental status, unspecified: Secondary | ICD-10-CM | POA: Diagnosis not present

## 2017-09-23 DIAGNOSIS — Z792 Long term (current) use of antibiotics: Secondary | ICD-10-CM

## 2017-09-23 DIAGNOSIS — J690 Pneumonitis due to inhalation of food and vomit: Secondary | ICD-10-CM | POA: Diagnosis present

## 2017-09-23 DIAGNOSIS — D62 Acute posthemorrhagic anemia: Secondary | ICD-10-CM | POA: Diagnosis not present

## 2017-09-23 DIAGNOSIS — R1312 Dysphagia, oropharyngeal phase: Secondary | ICD-10-CM | POA: Diagnosis not present

## 2017-09-23 DIAGNOSIS — R1110 Vomiting, unspecified: Secondary | ICD-10-CM | POA: Diagnosis not present

## 2017-09-23 DIAGNOSIS — K7040 Alcoholic hepatic failure without coma: Principal | ICD-10-CM | POA: Diagnosis present

## 2017-09-23 LAB — CBC WITH DIFFERENTIAL/PLATELET
Basophils Absolute: 0 10*3/uL (ref 0.0–0.1)
Basophils Relative: 0 %
Eosinophils Absolute: 0.2 10*3/uL (ref 0.0–0.7)
Eosinophils Relative: 4 %
HCT: 20 % — ABNORMAL LOW (ref 39.0–52.0)
Hemoglobin: 7 g/dL — ABNORMAL LOW (ref 13.0–17.0)
Lymphocytes Relative: 10 %
Lymphs Abs: 0.6 10*3/uL — ABNORMAL LOW (ref 0.7–4.0)
MCH: 35.7 pg — ABNORMAL HIGH (ref 26.0–34.0)
MCHC: 35 g/dL (ref 30.0–36.0)
MCV: 102 fL — ABNORMAL HIGH (ref 78.0–100.0)
Monocytes Absolute: 1.1 10*3/uL — ABNORMAL HIGH (ref 0.1–1.0)
Monocytes Relative: 18 %
Neutro Abs: 4 10*3/uL (ref 1.7–7.7)
Neutrophils Relative %: 68 %
Platelets: 86 10*3/uL — ABNORMAL LOW (ref 150–400)
RBC: 1.96 MIL/uL — ABNORMAL LOW (ref 4.22–5.81)
RDW: 21.9 % — ABNORMAL HIGH (ref 11.5–15.5)
WBC: 5.9 10*3/uL (ref 4.0–10.5)

## 2017-09-23 LAB — COMPREHENSIVE METABOLIC PANEL
ALT: 33 U/L (ref 17–63)
AST: 66 U/L — ABNORMAL HIGH (ref 15–41)
Albumin: 2.5 g/dL — ABNORMAL LOW (ref 3.5–5.0)
Alkaline Phosphatase: 91 U/L (ref 38–126)
Anion gap: 9 (ref 5–15)
BUN: 22 mg/dL — ABNORMAL HIGH (ref 6–20)
CO2: 24 mmol/L (ref 22–32)
Calcium: 8.3 mg/dL — ABNORMAL LOW (ref 8.9–10.3)
Chloride: 101 mmol/L (ref 101–111)
Creatinine, Ser: 2.44 mg/dL — ABNORMAL HIGH (ref 0.61–1.24)
GFR calc Af Amer: 33 mL/min — ABNORMAL LOW (ref >60)
GFR calc non Af Amer: 28 mL/min — ABNORMAL LOW (ref >60)
Glucose, Bld: 121 mg/dL — ABNORMAL HIGH (ref 65–99)
Potassium: 4.4 mmol/L (ref 3.5–5.1)
Sodium: 134 mmol/L — ABNORMAL LOW (ref 135–145)
Total Bilirubin: 5.4 mg/dL — ABNORMAL HIGH (ref 0.3–1.2)
Total Protein: 5.4 g/dL — ABNORMAL LOW (ref 6.5–8.1)

## 2017-09-23 LAB — VITAMIN D 1,25 DIHYDROXY: Vitamin D 1, 25 (OH)2 Total: <8 pg/mL — ABNORMAL LOW (ref 18–72)

## 2017-09-23 LAB — GLUCOSE, PLEURAL OR PERITONEAL FLUID: Glucose, Fluid: 121 mg/dL

## 2017-09-23 LAB — LACTATE DEHYDROGENASE, PLEURAL OR PERITONEAL FLUID: LD, Fluid: 20 U/L (ref 3–23)

## 2017-09-23 LAB — LIPASE, BLOOD: Lipase: 27 U/L (ref 11–51)

## 2017-09-23 LAB — BODY FLUID CELL COUNT WITH DIFFERENTIAL
Eos, Fluid: 0 %
Lymphs, Fluid: 77 %
Monocyte-Macrophage-Serous Fluid: 17 % — ABNORMAL LOW (ref 50–90)
Neutrophil Count, Fluid: 6 % (ref 0–25)
Total Nucleated Cell Count, Fluid: 37 cu mm (ref 0–1000)

## 2017-09-23 LAB — PROTIME-INR
INR: 2.79
Prothrombin Time: 29.2 seconds — ABNORMAL HIGH (ref 11.4–15.2)

## 2017-09-23 LAB — ALBUMIN, PLEURAL OR PERITONEAL FLUID: Albumin, Fluid: <1 g/dL

## 2017-09-23 LAB — PROTEIN, PLEURAL OR PERITONEAL FLUID: Total protein, fluid: <3 g/dL

## 2017-09-23 LAB — PREPARE RBC (CROSSMATCH)

## 2017-09-23 LAB — AMMONIA: Ammonia: 136 umol/L — ABNORMAL HIGH (ref 9–35)

## 2017-09-23 MED ORDER — BISMUTH SUBSALICYLATE 262 MG PO CHEW
262.0000 mg | CHEWABLE_TABLET | Freq: Three times a day (TID) | ORAL | Status: DC | PRN
  Filled 2017-09-23: qty 2

## 2017-09-23 MED ORDER — SODIUM CHLORIDE 0.9 % IV SOLN: Freq: Once | INTRAVENOUS | Status: DC

## 2017-09-23 MED ORDER — CIPROFLOXACIN HCL 500 MG PO TABS
500.0000 mg | ORAL_TABLET | Freq: Every evening | ORAL | Status: DC
  Administered 2017-09-23: 500 mg via ORAL
  Filled 2017-09-23: qty 1

## 2017-09-23 MED ORDER — ONDANSETRON HCL 4 MG/2ML IJ SOLN
4.0000 mg | Freq: Once | INTRAMUSCULAR | Status: AC
  Administered 2017-09-23: 4 mg via INTRAVENOUS
  Filled 2017-09-23: qty 2

## 2017-09-23 MED ORDER — FOLIC ACID 1 MG PO TABS
1.0000 mg | ORAL_TABLET | Freq: Every day | ORAL | Status: DC
  Administered 2017-09-23 – 2017-09-24 (×2): 1 mg via ORAL
  Filled 2017-09-23 (×2): qty 1

## 2017-09-23 MED ORDER — FERROUS SULFATE 325 (65 FE) MG PO TABS
325.0000 mg | ORAL_TABLET | Freq: Three times a day (TID) | ORAL | Status: DC
  Administered 2017-09-23 – 2017-09-24 (×3): 325 mg via ORAL
  Filled 2017-09-23 (×3): qty 1

## 2017-09-23 MED ORDER — DEXTROSE 5 % IV SOLN: 1.0000 g | INTRAVENOUS | Status: DC

## 2017-09-23 MED ORDER — MAGNESIUM OXIDE 400 (241.3 MG) MG PO TABS
200.0000 mg | ORAL_TABLET | Freq: Two times a day (BID) | ORAL | Status: DC
  Administered 2017-09-23 – 2017-09-24 (×3): 200 mg via ORAL
  Filled 2017-09-23 (×3): qty 1

## 2017-09-23 MED ORDER — ALBUMIN HUMAN 25 % IV SOLN
120.0000 g | Freq: Once | INTRAVENOUS | Status: DC
Start: 2017-09-23 — End: 2017-09-23

## 2017-09-23 MED ORDER — LACTULOSE 10 GM/15ML PO SOLN
30.0000 g | Freq: Four times a day (QID) | ORAL | Status: DC
  Administered 2017-09-23 – 2017-09-24 (×3): 30 g via ORAL
  Filled 2017-09-23 (×3): qty 45

## 2017-09-23 MED ORDER — LACTULOSE 10 GM/15ML PO SOLN
30.0000 g | Freq: Once | ORAL | Status: AC
  Administered 2017-09-23: 30 g via ORAL
  Filled 2017-09-23: qty 60

## 2017-09-23 MED ORDER — VITAMIN B-1 100 MG PO TABS
100.0000 mg | ORAL_TABLET | Freq: Every day | ORAL | Status: DC
  Administered 2017-09-23 – 2017-09-24 (×2): 100 mg via ORAL
  Filled 2017-09-23 (×2): qty 1

## 2017-09-23 MED ORDER — SUCRALFATE 1 G PO TABS
1.0000 g | ORAL_TABLET | Freq: Four times a day (QID) | ORAL | Status: DC
  Administered 2017-09-23 – 2017-09-24 (×5): 1 g via ORAL
  Filled 2017-09-23 (×5): qty 1

## 2017-09-23 MED ORDER — DEXTROSE 5 % IV SOLN: 2.0000 g | INTRAVENOUS | Status: DC

## 2017-09-23 MED ORDER — PANTOPRAZOLE SODIUM 40 MG PO TBEC
40.0000 mg | DELAYED_RELEASE_TABLET | Freq: Every day | ORAL | Status: DC
  Administered 2017-09-23 – 2017-09-24 (×2): 40 mg via ORAL
  Filled 2017-09-23 (×2): qty 1

## 2017-09-23 MED ORDER — SODIUM CHLORIDE 0.9 % IV SOLN
3.0000 g | Freq: Four times a day (QID) | INTRAVENOUS | Status: DC
  Administered 2017-09-23 – 2017-09-24 (×2): 3 g via INTRAVENOUS
  Filled 2017-09-23 (×3): qty 3

## 2017-09-23 MED ORDER — SODIUM CHLORIDE 0.9 % IV SOLN: 3.0000 g | Freq: Once | INTRAVENOUS | Status: DC

## 2017-09-23 NOTE — H&P (Addendum)
History and Physical    Thomas [name] HYW:[id number] DOB: 06-30-1961 DOA: 09/23/2017  PCP: Thomas [name], ThomasMarlou Sa, MD  Patient coming from: home  I have personally briefly reviewed patient'[name] old medical records in Thomas [name]  Chief Complaint: confusion  HPI: Thomas [name] is Thomas [name] 56 y.o. male with medical history significant of alcoholic cirrhosis with end-stage liver disease being evaluated for transplant, anemia, GERD, portal hypertension presenting with confusion.  History is obtained with the assistance of his wife due to mental status.  Mr. Thomas [name] symptoms started this morning. He started to cough and then had an episode of emesis. There is no blood. He lives unassigned for Thomas [name] while after this and his wife noticed that he seemed out of it. The occurred around 6:30 this morning.  His wife denies that he'[name] had any recent fevers, chills, chest pain, shortness of breath, abdominal pain. Yesterday he was pretty normal, although she did notice him positive go blank for second. She tried to give him extra doses of lactulose yesterday and on Friday due to an elevated ammonia that was seen in clinic, but he did not take these extra doses. She does not think he had Thomas [name] bowel movement yesterday.  He'[name] had multiple recent admissions related to cirrhosis.    ED Course: labs, imaging, call for admission for hepatic encephalopathy.  Review of Systems: Unable to perform except as above for mental status  Past Medical History:  Diagnosis Date  . Alcohol abuse 2013  . Alcoholic cirrhosis ([place]) 03/8545  . Anemia 02/2016   in setting of GI blood loss, but MCV macrocytic.   . Coagulopathy ([place]) 05/2015  . Diverticulosis 09/2015  . 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 GERD (gastroesophageal reflux disease)   . Hyperlipidemia   . Hypertension   . Pneumonia   . Portal hypertension ([place]) 09/2015   with portal gastropathy  . Thrombocytopenia ([place]) 02/2016  . Tubular adenoma of colon 09/2015    Past  Surgical History:  Procedure Laterality Date  . ESOPHAGOGASTRODUODENOSCOPY N/Tyliek Timberman 03/05/2016   Procedure: ESOPHAGOGASTRODUODENOSCOPY (EGD);  Surgeon: Ladene Artist, MD;  Location: [name];  Service: Endoscopy;  Laterality: N/Mahreen Schewe;  . ESOPHAGOGASTRODUODENOSCOPY N/Dinesha Twiggs 04/29/2017   Procedure: ESOPHAGOGASTRODUODENOSCOPY (EGD);  Surgeon: Milus Banister, MD;  Location: [name];  Service: Endoscopy;  Laterality: N/Reia Viernes;  . ESOPHAGOGASTRODUODENOSCOPY (EGD) WITH PROPOFOL N/Kiyah Demartini 03/29/2017   Procedure: ESOPHAGOGASTRODUODENOSCOPY (EGD) WITH PROPOFOL;  Surgeon: Doran Stabler, MD;  Location: [facility];  Service: Endoscopy;  Laterality: N/Binyamin Nelis;  . IR PARACENTESIS  05/15/2017  . IR PARACENTESIS  06/11/2017  . IR PARACENTESIS  06/26/2017  . IR PARACENTESIS  07/10/2017  . IR PARACENTESIS  08/16/2017  . IR PARACENTESIS  09/14/2017  . IR THORACENTESIS ASP PLEURAL SPACE W/IMG GUIDE  06/28/2017  . testicle prosthesis       reports that he has never smoked. He has never used smokeless tobacco. He reports that he does not drink alcohol or use drugs.  Allergies  Allergen Reactions  . Xifaxan [Rifaximin] Swelling and Other (See Comments)    Reaction:  All over body swelling     Family History  Problem Relation Age of Onset  . Diabetes Father   . Parkinson'[name] disease Father   . Diabetes Paternal Aunt   . Glaucoma Mother    Prior to Admission medications   Medication Sig Start Date End Date Taking? Authorizing Provider  benzonatate (TESSALON) 100 MG capsule Take 1 capsule (100 mg total) by mouth 2 (two)  times daily. Patient taking differently: Take 100 mg by mouth 2 (two) times daily as needed for cough.  09/07/17  Yes Thomas Lis, MD  bismuth subsalicylate (PEPTO BISMOL) 262 MG chewable tablet Chew 262-524 mg by mouth 3 (three) times daily as needed for indigestion.   Yes [provider]  calcium carbonate (TUMS EX) 750 MG chewable tablet Chew 1-2 tablets by mouth 3 (three) times daily as needed for  heartburn.   Yes [provider]  calcium-vitamin D (OSCAL WITH D) 500-200 MG-UNIT tablet Take 1 tablet by mouth 2 (two) times daily.   Yes [provider]  ciprofloxacin (CIPRO) 500 MG tablet TAKE 1 TABLET BY MOUTH EVERY EVENING 08/20/17  Yes Thomas [name], Thomas Lines, MD  Dextromethorphan HBr (TUSSIN COUGH PO) Take 2 capsules by mouth at bedtime.    Yes [provider]  ergocalciferol (VITAMIN D2) 50000 units capsule Take 50,000 Units by mouth once Thomas [name] week.   Yes [provider]  ferrous sulfate 325 (65 FE) MG tablet Take 1 tablet (325 mg total) by mouth 3 (three) times daily with meals. Patient taking differently: Take 325 mg by mouth every evening.  04/30/17  Yes Patrecia Pour, Christean Grief, MD  folic acid (FOLVITE) 1 MG tablet TAKE 1 TABLET BY MOUTH AT BEDTIME 08/20/17  Yes Thomas [name], Thomas Lines, MD  lactulose (CEPHULAC) 20 g packet Take 1 packet (20 g total) by mouth 3 (three) times daily. Patient taking differently: Take 20 g by mouth 2 (two) times daily.  08/25/17  Yes Thomas [name], Thomas Woodrow Drab, MD  magnesium oxide (MAG-OX) 400 (241.3 Mg) MG tablet Take 1/2 tablet twice daily. 09/20/17  Yes Thomas [name], Thomas S, PA-C  pantoprazole (PROTONIX) 40 MG tablet TAKE 1 TABLET (40 MG TOTAL) BY MOUTH 2 (TWO) TIMES DAILY. 07/06/17  Yes Thomas [name], Thomas Lines, MD  propranolol (INDERAL) 20 MG tablet Take 1 tablet (20 mg total) by mouth 2 (two) times daily. Patient taking differently: Take 20 mg by mouth daily.  09/21/17  Yes Thomas [name], Thomas Lines, MD  sucralfate (CARAFATE) 1 g tablet TAKE 1 TABLET BY MOUTH 4 TIMES DAILY 08/23/17  Yes Thomas [name], Thomas Lines, MD  thiamine 100 MG tablet Take 1 tablet (100 mg total) by mouth daily. 09/20/17  Yes Thomas [name], Thomas S, PA-C  furosemide (LASIX) 20 MG tablet Take 40 mg by mouth 2 (two) times daily.    [provider]  spironolactone (ALDACTONE) 100 MG tablet Take 100 mg by mouth daily.    [provider]    Physical Exam: Vitals:   09/23/17 1000 09/23/17 1141 09/23/17 1142  09/23/17 1300  BP: 102/61  102/61 (!) 95/56  Pulse: 63  61 63  Resp: (!) 21  (!) 23 (!) 21  Temp:  (!) 97.3 F (36.3 C)    TempSrc:  Rectal    SpO2: 97%  97% 96%  Height:        Constitutional: Sleepy, NA Vitals:   09/23/17 1000 09/23/17 1141 09/23/17 1142 09/23/17 1300  BP: 102/61  102/61 (!) 95/56  Pulse: 63  61 63  Resp: (!) 21  (!) 23 (!) 21  Temp:  (!) 97.3 F (36.3 C)    TempSrc:  Rectal    SpO2: 97%  97% 96%  Height:       Eyes: PERRL, lids and conjunctivae normal ENMT: Mucous membranes are moist. Posterior pharynx clear of any exudate or lesions.Normal dentition.  Neck: normal, supple, no masses, no thyromegaly Respiratory: clear to auscultation bilaterally, no wheezing,  no crackles. Normal respiratory effort. No accessory muscle use.  Cardiovascular: Regular rate and rhythm, no murmurs / rubs / gallops. 2+ LEE bilaterally. 2+ pedal pulses.  Abdomen: Initially grimaced with palpation diffusely (later when exam repeated he did not do this).  Distended.   Musculoskeletal: no clubbing / cyanosis. No joint deformity upper and lower extremities. Good ROM, no contractures. Normal muscle tone.  Skin: no rashes, lesions, ulcers. No induration.  Icteric Neurologic: CN 2-12 grossly intact. Moving all extremities.  Follows simple instructions.  Opens eyes to voice, but quickly falls alseep.  Psychiatric: Impaired.   Labs on Admission: I have personally reviewed following labs and imaging studies  CBC:  Recent Labs Lab 09/20/17 1143 09/23/17 0959  WBC 5.3 5.9  NEUTROABS 3.5 4.0  HGB 7.9 Repeated and verified X2.* 7.0*  HCT 23.3 Repeated and verified X2.* 20.0*  MCV 105.5* 102.0*  PLT 88.0* 86*   Basic Metabolic Panel:  Recent Labs Lab 09/20/17 1143 09/23/17 0959  NA 133* 134*  K 4.0 4.4  CL 102 101  CO2 25 24  GLUCOSE 206* 121*  BUN 21 22*  CREATININE 1.72* 2.44*  CALCIUM 8.0* 8.3*  MG 2.2  --    GFR: Estimated Creatinine Clearance: 33.4 mL/min (Davied Nocito) (by  C-G formula based on SCr of 2.44 mg/dL (H)). Liver Function Tests:  Recent Labs Lab 09/20/17 1143 09/23/17 0959  AST 52* 66*  ALT 28 33  ALKPHOS 84 91  BILITOT 6.9* 5.4*  PROT 5.5* 5.4*  ALBUMIN 2.6* 2.5*   No results for input([name]): LIPASE, AMYLASE in the last 168 hours.  Recent Labs Lab 09/20/17 1143 09/23/17 0959  AMMONIA 63* 136*   Coagulation Profile:  Recent Labs Lab 09/20/17 1143 09/23/17 0959  INR 2.6* 2.79   Cardiac Enzymes: No results for input([name]): CKTOTAL, CKMB, CKMBINDEX, TROPONINI in the last 168 hours. BNP (last 3 results) No results for input([name]): PROBNP in the last 8760 hours. HbA1C: No results for input([name]): HGBA1C in the last 72 hours. CBG: No results for input([name]): GLUCAP in the last 168 hours. Lipid Profile: No results for input([name]): CHOL, HDL, LDLCALC, TRIG, CHOLHDL, LDLDIRECT in the last 72 hours. Thyroid Function Tests: No results for input([name]): TSH, T4TOTAL, FREET4, T3FREE, THYROIDAB in the last 72 hours. Anemia Panel: No results for input([name]): VITAMINB12, FOLATE, FERRITIN, TIBC, IRON, RETICCTPCT in the last 72 hours. Urine analysis:    Component Value Date/Time   COLORURINE YELLOW 09/15/2017 0711   APPEARANCEUR CLEAR 09/15/2017 0711   LABSPEC 1.015 09/15/2017 0711   PHURINE 5.0 09/15/2017 0711   GLUCOSEU NEGATIVE 09/15/2017 0711   HGBUR NEGATIVE 09/15/2017 0711   BILIRUBINUR NEGATIVE 09/15/2017 0711   KETONESUR 5 (Osiris Odriscoll) 09/15/2017 0711   PROTEINUR NEGATIVE 09/15/2017 0711   UROBILINOGEN 0.2 11/22/2012 0856   NITRITE NEGATIVE 09/15/2017 0711   LEUKOCYTESUR TRACE (Haely Leyland) 09/15/2017 0711    Radiological Exams on Admission: Dg Chest Portable 1 View  Result Date: 09/23/2017 CLINICAL DATA:  Lethargic.  Vomiting. EXAM: PORTABLE CHEST 1 VIEW COMPARISON:  09/05/2017 FINDINGS: The cardiac silhouette is enlarged. Mediastinal contours appear intact. There is no evidence of pneumothorax. Interval increase in right pleural effusion which occupies  approximately 60% of the volume of the right hemithorax. Suspected right lower lobe airspace consolidation. Increased interstitial markings in the aerated lung. Osseous structures are without acute abnormality. Soft tissues are grossly normal. IMPRESSION: Persistent enlargement of the cardiac silhouette with pulmonary vascular congestion. Enlarged right pleural effusion. Suspected right lower lobe airspace consolidation versus atelectasis.  Electronically Signed   By: Fidela Salisbury M.D.   On: 09/23/2017 10:08    EKG: Independently reviewed. Appears similar to priors, RBBB, prolonged qt  Assessment/Plan Active Problems:   Hepatic encephalopathy (HCC)   Hepatic Encephalopathy:  Ammonia elevated on 10/11 to ~60'[name].  Today 136.  Was normal until this morning when he had episode of emesis x 1 after Ashonti Leandro coughing fit and then was more confused and altered.  No fevers, chills, or infectious sx.  He did not have Tan Clopper BM yesterday. [ ]  Diagnostic para to r/o SBP [ ]  blood cx  Lactulose QID (allergic to rifaximin) GI consult   Nausea/Vomiting:  Occurred x 1 prior to his becoming more confused.  Will check KUB.  R/o SBP as above.  Initially grimaced Desera Graffeo bit with my abdominal exam, but on reevaluation seemed less uncomfortable.   Likely Aspiration Pneumonia: With coughing fit then N/V as above.  R sided pleural effusion and RLL consolidation vs atelectasis.  In setting of N/V above, possible aspiration and AMS above, will start unasyn.  Currently on room air.  Bcx as above.  [ ]  speech c/[name] ordered for swallow eval (currently NPO except sips with med, discussed with nurse who notes he'[name] been able to take crushed meds)  Acute kidney injury on CKD: Elevated from baseline of around 1.3-1.5. To 2.4 on presentation.  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  Diuretics are currently being held. - f/u Cr post transfusion - urine sodium - continue to hold diuretics  Anemia:   Similar to prior admissions.  No overt bleeding.  Hx of portal gastropathathy described as severe and thought to be source of his chronic GI bleeding on 5/20 endoscopy.  Down from 7.9 at last d/c on 10/6 to 7.0 today.  Discussed risks/benefits with wife.  - will transfuse 1 unit pRBC'[name] - continue PPI, carafate [ ]  GI c/[name] as above  Alcoholic Cirrhosis with ascites and hepatic encephalopathy  Portal gastropathy:  Meld 34.  Being evaluated for transplant.  - home cipro - holding propanolol for now with soft BP'[name] - holding diuretics [ ]  GI c/[name] as above  Hypotension:  Maps appropriate.  He has chronically low BP'[name].   Thrombocytopenia: likely 2/2 above  Prolonged QT: caution with qt prolonging meds [ ]  repeat AM ekg, of note is on cipro for SBP prophylaxis (consider alternative if persistently prolonged)  Of note his wife is very concerned about whether or not he'll make an upcoming appointment which is important for his transplant evaluation.  I discussed the importance of our treatment of his current illness which she understood.   DVT prophylaxis: SCD  Code Status: full  Family Communication: wife  Disposition Plan: after improvement  Consults called: GI, Dr. Collene Mares  Admission status: tele     Fayrene Helper MD [facility] [phone]  If 7PM-7AM, please contact night-coverage [email or web address] Password [email or web address]  09/23/2017, 2:24 PM

## 2017-09-23 NOTE — ED Triage Notes (Signed)
Patient had an elevated ammonia level 2 days ago and patient's wife reports that he is more lethargic today and confused. Patient also has been vomiting since 0600 today.

## 2017-09-23 NOTE — ED Provider Notes (Signed)
[facility] DEPT Provider Note   CSN: [id number] Arrival date & time: 09/23/17  4081     History   Chief Complaint Chief Complaint  Patient presents with  . letharagic  . Abnormal Lab    elevated ammonia  . Emesis    HPI Thomas [name] is a 56 y.o. male.  HPI   15yM accompanied by wife. He is very drowsy. Hx from wife and review of records. Increasing cough and confusion in past day or so. This morning episode of coughing and then what sounds like post tussive emesis. No blood or coffee grounds. Very drowsy and not talking much. Able to say that at [facility] and acknowledges that [employer] (Tigers hat and jacket with personal items). Not responding much to questioning otherwise. Told wife he feels tired. No acute pain complaints. She is concerned is ammonia level is elevated. She reports compliance with meds.   Past Medical History:  Diagnosis Date  . Alcohol abuse 2013  . Alcoholic cirrhosis ([place]) 03/4817  . Anemia 02/2016   in setting of GI blood loss, but MCV macrocytic.   . Coagulopathy ([place]) 05/2015  . Diverticulosis 09/2015  . Esophageal varices ([place]) 09/2015   small  . GERD (gastroesophageal reflux disease)   . Hyperlipidemia   . Hypertension   . Pneumonia   . Portal hypertension ([place]) 09/2015   with portal gastropathy  . Thrombocytopenia ([place]) 02/2016  . Tubular adenoma of colon 09/2015    Patient Active Problem List   Diagnosis Date Noted  . GI bleed 09/14/2017  . Acute hepatic encephalopathy 09/04/2017  . Acute upper GI bleed 06/13/2017  . HCAP (healthcare-associated pneumonia) 04/27/2017  . Acute renal failure (ARF) ([place]) 03/28/2017  . Hyperglycemia 03/28/2017  . Hypoalbuminemia 03/28/2017  . Edema 03/28/2017  . S/P thoracentesis   . Symptomatic anemia   . Dyspnea 03/03/2017  . Hypomagnesemia 11/26/2016  . Acute respiratory failure with hypoxia ([place]) 11/24/2016  . Pleural effusion associated with hepatic disorder 11/24/2016  .  Hypokalemia 11/24/2016  . SBP (spontaneous bacterial peritonitis) ([place]) 05/09/2016  . Severe sepsis ([place]) 05/09/2016  . Decompensation of cirrhosis of liver ([place]) 05/07/2016  . Hypotension 05/07/2016  . Ascites due to alcoholic cirrhosis ([place]) 56/31/4970  . Localized edema 04/17/2016  . Alcoholic cirrhosis of liver with ascites ([place])   . End stage liver disease ([place])   . Upper GI bleed 03/05/2016  . Coagulopathy ([place]) 03/05/2016  . Acute kidney injury ([place]) 03/05/2016  . Hepatic encephalopathy ([place]) 03/05/2016  . Macrocytic anemia 03/05/2016  . Thrombocytopenia ([place]) 03/05/2016  . Esophageal varices ([place]) 03/05/2016  . Iron deficiency anemia due to chronic blood loss 03/05/2016  . Idiopathic esophageal varices without bleeding ([place])   . Portal hypertensive gastropathy [facility])     Past Surgical History:  Procedure Laterality Date  . ESOPHAGOGASTRODUODENOSCOPY N/A 03/05/2016   Procedure: ESOPHAGOGASTRODUODENOSCOPY (EGD);  Surgeon: Ladene Artist, MD;  Location: [name];  Service: Endoscopy;  Laterality: N/A;  . ESOPHAGOGASTRODUODENOSCOPY N/A 04/29/2017   Procedure: ESOPHAGOGASTRODUODENOSCOPY (EGD);  Surgeon: Milus Banister, MD;  Location: [name];  Service: Endoscopy;  Laterality: N/A;  . ESOPHAGOGASTRODUODENOSCOPY (EGD) WITH PROPOFOL N/A 03/29/2017   Procedure: ESOPHAGOGASTRODUODENOSCOPY (EGD) WITH PROPOFOL;  Surgeon: Doran Stabler, MD;  Location: [facility] ENDOSCOPY;  Service: Endoscopy;  Laterality: N/A;  . IR PARACENTESIS  05/15/2017  . IR PARACENTESIS  06/11/2017  . IR PARACENTESIS  06/26/2017  . IR PARACENTESIS  07/10/2017  . IR PARACENTESIS  08/16/2017  .  IR PARACENTESIS  09/14/2017  . IR THORACENTESIS ASP PLEURAL SPACE W/IMG GUIDE  06/28/2017  . testicle prosthesis         Home Medications    Prior to Admission medications   Medication Sig Start Date End Date Taking? Authorizing Provider  benzonatate (TESSALON) 100 MG capsule Take 1 capsule (100 mg total) by mouth 2  (two) times daily. Patient taking differently: Take 100 mg by mouth 2 (two) times daily as needed for cough.  09/07/17  Yes Robbie Lis, MD  bismuth subsalicylate (PEPTO BISMOL) 262 MG chewable tablet Chew 262-524 mg by mouth 3 (three) times daily as needed for indigestion.   Yes [provider]  calcium carbonate (TUMS EX) 750 MG chewable tablet Chew 1-2 tablets by mouth 3 (three) times daily as needed for heartburn.   Yes [provider]  calcium-vitamin D (OSCAL WITH D) 500-200 MG-UNIT tablet Take 1 tablet by mouth 2 (two) times daily.   Yes [provider]  ciprofloxacin (CIPRO) 500 MG tablet TAKE 1 TABLET BY MOUTH EVERY EVENING 08/20/17  Yes Pyrtle, Lajuan Lines, MD  Dextromethorphan HBr (TUSSIN COUGH PO) Take 2 capsules by mouth at bedtime.    Yes [provider]  ergocalciferol (VITAMIN D2) 50000 units capsule Take 50,000 Units by mouth once a week.   Yes [provider]  ferrous sulfate 325 (65 FE) MG tablet Take 1 tablet (325 mg total) by mouth 3 (three) times daily with meals. Patient taking differently: Take 325 mg by mouth every evening.  04/30/17  Yes Patrecia Pour, Christean Grief, MD  folic acid (FOLVITE) 1 MG tablet TAKE 1 TABLET BY MOUTH AT BEDTIME 08/20/17  Yes Pyrtle, Lajuan Lines, MD  lactulose (CEPHULAC) 20 g packet Take 1 packet (20 g total) by mouth 3 (three) times daily. Patient taking differently: Take 20 g by mouth 2 (two) times daily.  08/25/17  Yes Regalado, Belkys A, MD  magnesium oxide (MAG-OX) 400 (241.3 Mg) MG tablet Take 1/2 tablet twice daily. 09/20/17  Yes Esterwood, Amy S, PA-C  pantoprazole (PROTONIX) 40 MG tablet TAKE 1 TABLET (40 MG TOTAL) BY MOUTH 2 (TWO) TIMES DAILY. 07/06/17  Yes Pyrtle, Lajuan Lines, MD  propranolol (INDERAL) 20 MG tablet Take 1 tablet (20 mg total) by mouth 2 (two) times daily. Patient taking differently: Take 20 mg by mouth daily.  09/21/17  Yes Pyrtle, Lajuan Lines, MD  sucralfate (CARAFATE) 1 g tablet TAKE 1 TABLET BY MOUTH 4 TIMES  DAILY 08/23/17  Yes Pyrtle, Lajuan Lines, MD  thiamine 100 MG tablet Take 1 tablet (100 mg total) by mouth daily. 09/20/17  Yes Esterwood, Amy S, PA-C  furosemide (LASIX) 20 MG tablet Take 40 mg by mouth 2 (two) times daily.    [provider]  spironolactone (ALDACTONE) 100 MG tablet Take 100 mg by mouth daily.    [provider]    Family History Family History  Problem Relation Age of Onset  . Diabetes Father   . Parkinson's disease Father   . Diabetes Paternal Aunt   . Glaucoma Mother     Social History Social History  Substance Use Topics  . Smoking status: Never Smoker  . Smokeless tobacco: Never Used  . Alcohol use No     Comment: Quit 2/17, relapse in 1/18 for short period of time     Allergies   Xifaxan [rifaximin]   Review of Systems Review of Systems  All systems reviewed and negative, other than as noted in HPI.  Physical Exam Updated Vital Signs BP 102/61   Pulse 61   Temp (!) 97.3 F (36.3 C) (Rectal)   Resp (!) 23   Ht 5\' 5"  (1.651 m)   SpO2 97%   Physical Exam  Constitutional: He appears well-developed and well-nourished. No distress.  Laying in bed. Chronically ill appearing, but not toxic.   HENT:  Head: Normocephalic and atraumatic.  Eyes: Conjunctivae are normal. Right eye exhibits no discharge. Left eye exhibits no discharge.  Neck: Neck supple.  Cardiovascular: Normal rate, regular rhythm and normal heart sounds.  Exam reveals no gallop and no friction rub.   No murmur heard. Pulmonary/Chest: Effort normal and breath sounds normal. No respiratory distress.  Abdominal: Soft. He exhibits distension. There is no tenderness.  Mild distension. Soft. TTP? Hard to get reliable exam but possibly grimacing with palpation diffusely.   Musculoskeletal: He exhibits edema. He exhibits no tenderness.  Neurological:  Drowsy. Opens eyes to voice. Will answer some simple questions. Seems to respond to wife better than myself.   Skin: Skin is  warm and dry.  Psychiatric: He has a normal mood and affect. His behavior is normal. Thought content normal.  Nursing note and vitals reviewed.    ED Treatments / Results  Labs (all labs ordered are listed, but only abnormal results are displayed) Labs Reviewed  COMPREHENSIVE METABOLIC PANEL - Abnormal; Notable for the following:       Result Value   Sodium 134 (*)    Glucose, Bld 121 (*)    BUN 22 (*)    Creatinine, Ser 2.44 (*)    Calcium 8.3 (*)    Total Protein 5.4 (*)    Albumin 2.5 (*)    AST 66 (*)    Total Bilirubin 5.4 (*)    GFR calc non Af Amer 28 (*)    GFR calc Af Amer 33 (*)    All other components within normal limits  CBC WITH DIFFERENTIAL/PLATELET - Abnormal; Notable for the following:    RBC 1.96 (*)    Hemoglobin 7.0 (*)    HCT 20.0 (*)    MCV 102.0 (*)    MCH 35.7 (*)    RDW 21.9 (*)    Platelets 86 (*)    Lymphs Abs 0.6 (*)    Monocytes Absolute 1.1 (*)    All other components within normal limits  PROTIME-INR - Abnormal; Notable for the following:    Prothrombin Time 29.2 (*)    All other components within normal limits  AMMONIA - Abnormal; Notable for the following:    Ammonia 136 (*)    All other components within normal limits  BODY FLUID CULTURE  CULTURE, BLOOD (ROUTINE X 2)  CULTURE, BLOOD (ROUTINE X 2)  URINALYSIS, ROUTINE W REFLEX MICROSCOPIC  LACTATE DEHYDROGENASE, PLEURAL OR PERITONEAL FLUID  GLUCOSE, PLEURAL OR PERITONEAL FLUID  PROTEIN, PLEURAL OR PERITONEAL FLUID  ALBUMIN, PLEURAL OR PERITONEAL FLUID  BODY FLUID CELL COUNT WITH DIFFERENTIAL  TYPE AND SCREEN  PREPARE RBC (CROSSMATCH)    EKG  EKG Interpretation  Date/Time:  Sunday September 23 2017 08:07:37 EDT Ventricular Rate:  60 PR Interval:    QRS Duration: 163 QT Interval:  536 QTC Calculation: 536 R Axis:   87 Text Interpretation:  Sinus rhythm Right bundle branch block Since last tracing rate slower Confirmed by Orlie Dakin [phone]) on 09/23/2017 8:20:03 AM        Radiology Dg Chest Portable 1 View  Result Date: 09/23/2017 CLINICAL DATA:  Lethargic.  Vomiting. EXAM: PORTABLE CHEST 1 VIEW COMPARISON:  09/05/2017 FINDINGS: The cardiac silhouette is enlarged. Mediastinal contours appear intact. There is no evidence of pneumothorax. Interval increase in right pleural effusion which occupies approximately 60% of the volume of the right hemithorax. Suspected right lower lobe airspace consolidation. Increased interstitial markings in the aerated lung. Osseous structures are without acute abnormality. Soft tissues are grossly normal. IMPRESSION: Persistent enlargement of the cardiac silhouette with pulmonary vascular congestion. Enlarged right pleural effusion. Suspected right lower lobe airspace consolidation versus atelectasis. Electronically Signed   By: Fidela Salisbury M.D.   On: 09/23/2017 10:08    Procedures .Paracentesis Date/Time: 09/23/2017 2:01 PM Performed by: Virgel Manifold Authorized by: Virgel Manifold   Consent:    Consent obtained:  Verbal   Consent given by:  Patient and spouse   Risks discussed:  Bleeding, bowel perforation, infection and pain Pre-procedure details:    Procedure purpose:  Diagnostic   Preparation: Patient was prepped and draped in usual sterile fashion   Anesthesia (see MAR for exact dosages):    Anesthesia method:  Local infiltration   Local anesthetic:  Lidocaine 1% w/o epi Procedure details:    Ultrasound guidance: yes     Puncture site:  L lower quadrant   Fluid appearance:  Clear and yellow   Dressing:  Adhesive bandage Post-procedure details:    Patient tolerance of procedure:  Tolerated well, no immediate complications Comments:     ~30cc of clear yellow ascitic fluid removed for diagnostic testing.    (including critical care time)  Medications Ordered in ED Medications  lactulose (CHRONULAC) 10 GM/15ML solution 30 g (not administered)  0.9 %  sodium chloride infusion (not administered)    Ampicillin-Sulbactam (UNASYN) 3 g in sodium chloride 0.9 % 100 mL IVPB (not administered)  Ampicillin-Sulbactam (UNASYN) 3 g in sodium chloride 0.9 % 100 mL IVPB (not administered)  lactulose (CHRONULAC) 10 GM/15ML solution 30 g (30 g Oral Given 09/23/17 1150)  ondansetron (ZOFRAN) injection 4 mg (4 mg Intravenous Given 09/23/17 1316)     Initial Impression / Assessment and Plan / ED Course  I have reviewed the triage vital signs and the nursing notes.  Pertinent labs & imaging results that were available during my care of the patient were reviewed by me and considered in my medical decision making (see chart for details).     56yM with increasing confusion. Likely hepatic encephalopathy. Ammonia >100. Lactulose.   Decreased breath sounds on R and large pleural effusion on imaging. Has previously needed thoracentesis. No respiratory distress and not hypoxic. No emergent indication for one currently. Has a cough but chronic to some degree. Deferring tx in ED for infectious process. Suspect that atelectasis.  Admitting provider concerned about abdominal exam. Distended but soft. Hard to get reliable exam, but does seem to react with abdominal palpation. On cipro prophylactically for SBP. Diagnostic tap done. Labs pending.   Anemic, but chronic and not quite to degree that I would transfuse currently. Trend.   Needs admitted for ongoing tx/testing.   Final Clinical Impressions(s) / ED Diagnoses   Final diagnoses:  Encephalopathy, hepatic (HCC)  Anemia, unspecified type  Pleural effusion    New Prescriptions New Prescriptions   No medications on file     Virgel Manifold, MD 09/24/17 1234

## 2017-09-23 NOTE — Progress Notes (Signed)
BLood that was ordered in ED not given per day shift nurse due to bad IV. Order was placed for IV to insert IV so that blood can be given as well as IV atb. Still waiting for IV team to insert IV. Will c/t monitor.

## 2017-09-23 NOTE — Progress Notes (Signed)
IV placed by IV team, atb started. Will infuse one unit of blood once IV ATB completed. Wife at bedside. Will c/t monitor.

## 2017-09-23 NOTE — ED Notes (Signed)
ED TO INPATIENT HANDOFF REPORT  Name/Age/Gender Thomas [name] 56 y.o. male  Code Status Code Status History    Date Active Date Inactive Code Status Order ID Comments User Context   09/14/2017  8:34 PM 09/15/2017  3:15 PM Full Code [id number]  Caren Griffins, MD Inpatient   09/04/2017  5:22 PM 09/07/2017  5:21 PM Full Code [id number]  Lavina Hamman, MD Inpatient   08/23/2017  4:45 AM 08/25/2017  8:07 PM Full Code [id number]  Edwin Dada, MD Inpatient   06/26/2017  5:24 PM 06/29/2017  1:45 PM Partial Code [id number] Stop Code Blue if survival seems unlikely per pt and wife Waldemar Dickens, MD Inpatient   06/26/2017  3:12 PM 06/26/2017  5:24 PM Full Code [id number]  Waldemar Dickens, MD ED   06/13/2017  8:58 PM 06/16/2017 11:23 PM DNR [id number]  Vianne Bulls, MD ED   06/13/2017  8:50 PM 06/13/2017  8:58 PM DNR [id number]  Vianne Bulls, MD ED   04/27/2017  5:17 PM 04/30/2017  6:52 PM DNR [id number]  Jonetta Osgood, MD Inpatient   04/27/2017  1:26 PM 04/27/2017  5:17 PM Full Code [id number]  Jonetta Osgood, MD Inpatient   03/28/2017 12:44 AM 03/30/2017  2:20 PM Full Code [id number]  Karmen Bongo, MD Inpatient   03/03/2017  3:02 PM 03/06/2017 10:49 PM Full Code [id number]  Caren Griffins, MD Inpatient   11/24/2016 11:26 PM 11/26/2016  5:45 PM Full Code [id number]  Benito Mccreedy, MD Inpatient   03/05/2016  7:04 PM 03/07/2016  4:51 PM Full Code [id number]  Rama, Venetia Maxon, MD Inpatient   11/22/2012  8:22 AM 11/22/2012  6:51 PM Full Code [id number]  Delora Fuel, MD ED    Advance Directive Documentation     Most Recent Value  Type of Advance Directive  Living will  Pre-existing out of facility DNR order (yellow form or pink MOST form)  -  "MOST" Form in Place?  -      Home/SNF/Other Home  Chief Complaint disoriented,vomiting  Level of Care/Admitting Diagnosis ED Disposition    ED Disposition Condition [place]: [place] [[id number]]  Level of  Care: Telemetry [5]  Admit to tele based on following criteria: Other see comments  Comments: anemia  Diagnosis: Hepatic encephalopathy ([place]) [572.2.ICD-9-CM]  Admitting Physician: Elodia Florence [[id number]]  Attending Physician: Cephus Slater, A CALDWELL [phone]  Estimated length of stay: past midnight tomorrow  Certification:: I certify this patient will need inpatient services for at least 2 midnights  PT Class (Do Not Modify): Inpatient [101]  PT Acc Code (Do Not Modify): Private [1]       Medical History Past Medical History:  Diagnosis Date  . Alcohol abuse 2013  . Alcoholic cirrhosis ([place]) 08/4173  . Anemia 02/2016   in setting of GI blood loss, but MCV macrocytic.   . Coagulopathy ([place]) 05/2015  . Diverticulosis 09/2015  . Esophageal varices ([place]) 09/2015   small  . GERD (gastroesophageal reflux disease)   . Hyperlipidemia   . Hypertension   . Pneumonia   . Portal hypertension ([place]) 09/2015   with portal gastropathy  . Thrombocytopenia ([place]) 02/2016  . Tubular adenoma of colon 09/2015    Allergies Allergies  Allergen Reactions  . Xifaxan [Rifaximin] Swelling and Other (See Comments)    Reaction:  All over body swelling     IV Location/Drains/Wounds Patient Lines/Drains/Airways Status   Active Line/Drains/Airways  Name:   Placement date:   Placement time:   Site:   Days:   Peripheral IV 09/23/17 Left;Upper Arm  09/23/17    0932    Arm    less than 1          Labs/Imaging Results for orders placed or performed during the hospital encounter of 09/23/17 (from the past 48 hour(s))  Comprehensive metabolic panel     Status: Abnormal   Collection Time: 09/23/17  9:59 AM  Result Value Ref Range   Sodium 134 (L) 135 - 145 mmol/L   Potassium 4.4 3.5 - 5.1 mmol/L   Chloride 101 101 - 111 mmol/L   CO2 24 22 - 32 mmol/L   Glucose, Bld 121 (H) 65 - 99 mg/dL   BUN 22 (H) 6 - 20 mg/dL   Creatinine, Ser 2.44 (H) 0.61 - 1.24 mg/dL   Calcium 8.3 (L) 8.9 - 10.3  mg/dL   Total Protein 5.4 (L) 6.5 - 8.1 g/dL   Albumin 2.5 (L) 3.5 - 5.0 g/dL   AST 66 (H) 15 - 41 U/L   ALT 33 17 - 63 U/L   Alkaline Phosphatase 91 38 - 126 U/L   Total Bilirubin 5.4 (H) 0.3 - 1.2 mg/dL   GFR calc non Af Amer 28 (L) >60 mL/min   GFR calc Af Amer 33 (L) >60 mL/min    Comment: (NOTE) The eGFR has been calculated using the CKD EPI equation. This calculation has not been validated in all clinical situations. eGFR's persistently <60 mL/min signify possible Chronic Kidney Disease.    Anion gap 9 5 - 15  CBC with Differential     Status: Abnormal   Collection Time: 09/23/17  9:59 AM  Result Value Ref Range   WBC 5.9 4.0 - 10.5 K/uL   RBC 1.96 (L) 4.22 - 5.81 MIL/uL   Hemoglobin 7.0 (L) 13.0 - 17.0 g/dL   HCT 20.0 (L) 39.0 - 52.0 %   MCV 102.0 (H) 78.0 - 100.0 fL   MCH 35.7 (H) 26.0 - 34.0 pg   MCHC 35.0 30.0 - 36.0 g/dL   RDW 21.9 (H) 11.5 - 15.5 %   Platelets 86 (L) 150 - 400 K/uL    Comment: CONSISTENT WITH PREVIOUS RESULT REPEATED TO VERIFY    Neutrophils Relative % 68 %   Lymphocytes Relative 10 %   Monocytes Relative 18 %   Eosinophils Relative 4 %   Basophils Relative 0 %   Neutro Abs 4.0 1.7 - 7.7 K/uL   Lymphs Abs 0.6 (L) 0.7 - 4.0 K/uL   Monocytes Absolute 1.1 (H) 0.1 - 1.0 K/uL   Eosinophils Absolute 0.2 0.0 - 0.7 K/uL   Basophils Absolute 0.0 0.0 - 0.1 K/uL   RBC Morphology TEARDROP CELLS     Comment: ACANTHOCYTES  Protime-INR     Status: Abnormal   Collection Time: 09/23/17  9:59 AM  Result Value Ref Range   Prothrombin Time 29.2 (H) 11.4 - 15.2 seconds   INR 2.79   Ammonia     Status: Abnormal   Collection Time: 09/23/17  9:59 AM  Result Value Ref Range   Ammonia 136 (H) 9 - 35 umol/L  Type and screen [place]     Status: None   Collection Time: 09/23/17 10:19 AM  Result Value Ref Range   ABO/RH(D) A POS    Antibody Screen NEG    Sample Expiration 09/26/2017    Dg Chest Portable 1 View  Result Date:  09/23/2017 CLINICAL DATA:  Lethargic.  Vomiting. EXAM: PORTABLE CHEST 1 VIEW COMPARISON:  09/05/2017 FINDINGS: The cardiac silhouette is enlarged. Mediastinal contours appear intact. There is no evidence of pneumothorax. Interval increase in right pleural effusion which occupies approximately 60% of the volume of the right hemithorax. Suspected right lower lobe airspace consolidation. Increased interstitial markings in the aerated lung. Osseous structures are without acute abnormality. Soft tissues are grossly normal. IMPRESSION: Persistent enlargement of the cardiac silhouette with pulmonary vascular congestion. Enlarged right pleural effusion. Suspected right lower lobe airspace consolidation versus atelectasis. Electronically Signed   By: Fidela Salisbury M.D.   On: 09/23/2017 10:08   Dg Abd 2 Views  Result Date: 09/23/2017 CLINICAL DATA:  Vomiting.  Lethargy. EXAM: ABDOMEN - 2 VIEW COMPARISON:  None. FINDINGS: The bowel gas pattern is normal. There is no evidence of free air. Probable small volume ascites. No radio-opaque calculi or other significant radiographic abnormality is seen. IMPRESSION: Nonobstructive bowel gas pattern. Probable small volume ascites. Electronically Signed   By: Fidela Salisbury M.D.   On: 09/23/2017 14:51    Pending Labs Unresulted Labs    Start     Ordered   09/23/17 1450  Sodium, urine, random  Once,   R     09/23/17 1449   09/23/17 1450  Lipase, blood  Add-on,   R     09/23/17 1450   09/23/17 1405  Prepare RBC  (Adult Blood Administration - Red Blood Cells)  Once,   R    Comments:  H/H 7   Question Answer Comment  # of Units 1 unit   Transfusion Indications Other (Specify)   If emergent release call blood bank Not emergent release      09/23/17 1405   09/23/17 1331  Culture, blood (routine x 2)  BLOOD CULTURE X 2,   R     09/23/17 1330   09/23/17 1242  Body fluid cell count with differential  Once,   R    Question:  Are there also cytology or  pathology orders on this specimen?  Answer:  No   09/23/17 1241   09/23/17 1241  Lactate dehydrogenase (pleural or peritoneal fluid)  (Peritoneal fluid analysis panel (pnl))  STAT,   STAT     09/23/17 1241   09/23/17 1241  Glucose, pleural or peritoneal fluid  (Peritoneal fluid analysis panel (pnl))  STAT,   STAT     09/23/17 1241   09/23/17 1241  Protein, pleural or peritoneal fluid  (Peritoneal fluid analysis panel (pnl))  STAT,   STAT     09/23/17 1241   09/23/17 1241  Albumin, pleural or peritoneal fluid  (Peritoneal fluid analysis panel (pnl))  STAT,   STAT     09/23/17 1241   09/23/17 1241  Body fluid culture  (Peritoneal fluid analysis panel (pnl))  STAT,   STAT    Question:  Are there also cytology or pathology orders on this specimen?  Answer:  No   09/23/17 1241   09/23/17 0952  Urinalysis, Routine w reflex microscopic  Once,   R     09/23/17 1610   Signed and Held  Comprehensive metabolic panel  Tomorrow morning,   R     Signed and Held   Signed and Held  CBC  Tomorrow morning,   R     Signed and Held   Signed and Held  Protime-INR  Tomorrow morning,   R     Signed and Held  Signed and Held  APTT  Tomorrow morning,   R     Signed and Held      Vitals/Pain Today's Vitals   09/23/17 1141 09/23/17 1142 09/23/17 1145 09/23/17 1300  BP:  102/61  (!) 95/56  Pulse:  61  63  Resp:  (!) 23  (!) 21  Temp: (!) 97.3 F (36.3 C)     TempSrc: Rectal     SpO2:  97%  96%  Height:      PainSc:  0-No pain 0-No pain     Isolation Precautions No active isolations  Medications Medications  lactulose (CHRONULAC) 10 GM/15ML solution 30 g (not administered)  0.9 %  sodium chloride infusion (not administered)  Ampicillin-Sulbactam (UNASYN) 3 g in sodium chloride 0.9 % 100 mL IVPB (not administered)  Ampicillin-Sulbactam (UNASYN) 3 g in sodium chloride 0.9 % 100 mL IVPB (not administered)  lactulose (CHRONULAC) 10 GM/15ML solution 30 g (30 g Oral Given 09/23/17 1150)  ondansetron  (ZOFRAN) injection 4 mg (4 mg Intravenous Given 09/23/17 1316)    Mobility non-ambulatory

## 2017-09-23 NOTE — Progress Notes (Signed)
Thomas [name] with pharmacy notified that first dose of Unasyn not given in ED and we are waiting for IV team to place PIV.

## 2017-09-23 NOTE — Progress Notes (Signed)
Patient arrived on unit via stretcher from ED.  Wife at bedside.  Telemetry placed per MD order and CMT notified.  

## 2017-09-24 DIAGNOSIS — N179 Acute kidney failure, unspecified: Secondary | ICD-10-CM

## 2017-09-24 DIAGNOSIS — K7030 Alcoholic cirrhosis of liver without ascites: Secondary | ICD-10-CM

## 2017-09-24 DIAGNOSIS — D62 Acute posthemorrhagic anemia: Secondary | ICD-10-CM

## 2017-09-24 DIAGNOSIS — K7290 Hepatic failure, unspecified without coma: Secondary | ICD-10-CM

## 2017-09-24 LAB — ETHANOL: ETHANOL LVL: NEGATIVE %

## 2017-09-24 LAB — TYPE AND SCREEN
ABO/RH(D): A POS
Antibody Screen: NEGATIVE
Unit division: 0

## 2017-09-24 LAB — MAGNESIUM: Magnesium: 2.3 mg/dL (ref 1.7–2.4)

## 2017-09-24 LAB — COMPREHENSIVE METABOLIC PANEL
ALBUMIN: 2.4 g/dL — AB (ref 3.5–5.0)
ALK PHOS: 79 U/L (ref 38–126)
ALT: 34 U/L (ref 17–63)
ANION GAP: 8 (ref 5–15)
AST: 58 U/L — ABNORMAL HIGH (ref 15–41)
BUN: 25 mg/dL — ABNORMAL HIGH (ref 6–20)
CALCIUM: 8.6 mg/dL — AB (ref 8.9–10.3)
CO2: 23 mmol/L (ref 22–32)
Chloride: 105 mmol/L (ref 101–111)
Creatinine, Ser: 2.78 mg/dL — ABNORMAL HIGH (ref 0.61–1.24)
GFR calc Af Amer: 28 mL/min — ABNORMAL LOW (ref >60)
GFR calc non Af Amer: 24 mL/min — ABNORMAL LOW (ref >60)
GLUCOSE: 105 mg/dL — AB (ref 65–99)
Potassium: 4.2 mmol/L (ref 3.5–5.1)
SODIUM: 136 mmol/L (ref 135–145)
Total Bilirubin: 8.5 mg/dL — ABNORMAL HIGH (ref 0.3–1.2)
Total Protein: 5.4 g/dL — ABNORMAL LOW (ref 6.5–8.1)

## 2017-09-24 LAB — URINALYSIS, ROUTINE W REFLEX MICROSCOPIC
BILIRUBIN URINE: NEGATIVE
GLUCOSE, UA: NEGATIVE mg/dL
Ketones, ur: NEGATIVE mg/dL
NITRITE: NEGATIVE
PH: 5 (ref 5.0–8.0)
Protein, ur: NEGATIVE mg/dL
SPECIFIC GRAVITY, URINE: 1.018 (ref 1.005–1.030)

## 2017-09-24 LAB — CBC
HCT: 24.5 % — ABNORMAL LOW (ref 39.0–52.0)
HEMOGLOBIN: 8.6 g/dL — AB (ref 13.0–17.0)
MCH: 35 pg — AB (ref 26.0–34.0)
MCHC: 35.1 g/dL (ref 30.0–36.0)
MCV: 99.6 fL (ref 78.0–100.0)
Platelets: 77 10*3/uL — ABNORMAL LOW (ref 150–400)
RBC: 2.46 MIL/uL — ABNORMAL LOW (ref 4.22–5.81)
RDW: 22.3 % — ABNORMAL HIGH (ref 11.5–15.5)
WBC: 6.4 10*3/uL (ref 4.0–10.5)

## 2017-09-24 LAB — PROTIME-INR
INR: 2.63
PROTHROMBIN TIME: 27.8 s — AB (ref 11.4–15.2)

## 2017-09-24 LAB — BPAM RBC
Blood Product Expiration Date: 201810242359
ISSUE DATE / TIME: 201810142247
Unit Type and Rh: 6200

## 2017-09-24 LAB — SODIUM, URINE, RANDOM: Sodium, Ur: <10 mmol/L

## 2017-09-24 LAB — APTT: aPTT: 45 seconds — ABNORMAL HIGH (ref 24–36)

## 2017-09-24 MED ORDER — ALBUMIN HUMAN 25 % IV SOLN: 75.0000 g | Freq: Once | INTRAVENOUS | Status: DC

## 2017-09-24 MED ORDER — ALBUMIN HUMAN 25 % IV SOLN: 75.0000 g | Freq: Every day | INTRAVENOUS | 0 refills | Status: DC

## 2017-09-24 MED ORDER — MIDODRINE HCL 5 MG PO TABS
7.5000 mg | ORAL_TABLET | Freq: Three times a day (TID) | ORAL | Status: DC
  Administered 2017-09-24: 7.5 mg via ORAL
  Filled 2017-09-24 (×2): qty 1

## 2017-09-24 MED ORDER — SODIUM CHLORIDE 0.9 % IV SOLN: 50.0000 ug/h | INTRAVENOUS | Status: DC

## 2017-09-24 MED ORDER — SODIUM CHLORIDE 0.9 % IV SOLN: 3.0000 g | Freq: Two times a day (BID) | INTRAVENOUS | Status: DC

## 2017-09-24 MED ORDER — SODIUM CHLORIDE 0.9 % IV SOLN
3.0000 g | Freq: Two times a day (BID) | INTRAVENOUS | Status: DC
  Administered 2017-09-24: 3 g via INTRAVENOUS
  Filled 2017-09-24 (×2): qty 3

## 2017-09-24 MED ORDER — MIDODRINE HCL 2.5 MG PO TABS: 7.5000 mg | ORAL_TABLET | Freq: Three times a day (TID) | ORAL | Status: DC

## 2017-09-24 MED ORDER — LACTULOSE ENEMA
300.0000 mL | Freq: Two times a day (BID) | ORAL | Status: DC
  Administered 2017-09-24: 300 mL via RECTAL
  Filled 2017-09-24 (×3): qty 300

## 2017-09-24 MED ORDER — SODIUM CHLORIDE 0.9 % IV SOLN
50.0000 ug/h | INTRAVENOUS | Status: DC
  Administered 2017-09-24: 50 ug/h via INTRAVENOUS
  Filled 2017-09-24 (×2): qty 1

## 2017-09-24 MED ORDER — LACTULOSE 10 GM/15ML PO SOLN
45.0000 g | Freq: Four times a day (QID) | ORAL | Status: DC
  Administered 2017-09-24 (×3): 45 g via ORAL
  Filled 2017-09-24 (×3): qty 90

## 2017-09-24 MED ORDER — LACTULOSE 10 GM/15ML PO SOLN
45.0000 g | Freq: Four times a day (QID) | ORAL | Status: DC
  Filled 2017-09-24 (×2): qty 90

## 2017-09-24 MED ORDER — ALBUMIN HUMAN 25 % IV SOLN
75.0000 g | Freq: Every day | INTRAVENOUS | Status: DC
  Administered 2017-09-24: 75 g via INTRAVENOUS
  Filled 2017-09-24 (×2): qty 300

## 2017-09-24 NOTE — Progress Notes (Signed)
PHARMACY NOTE:  ANTIMICROBIAL RENAL DOSAGE ADJUSTMENT  Current antimicrobial regimen includes a mismatch between antimicrobial dosage and estimated renal function.  As per policy approved by the Pharmacy & Therapeutics and Medical Executive Committees, the antimicrobial dosage will be adjusted accordingly.  Current antimicrobial dosage:  Unasyn 3g q6h  Indication: aspiration pneumonia  Renal Function:   Estimated Creatinine Clearance: 29.3 mL/min (A) (by C-G formula based on SCr of 2.78 mg/dL (H)). []      On intermittent HD, scheduled: []      On CRRT    Antimicrobial dosage has been changed to:  Unasyn 3g q12h  Additional comments:   Thank you for allowing pharmacy to be a part of this patient's care.  Lynelle Doctor, [facility] 09/24/2017 10:22 AM

## 2017-09-24 NOTE — Evaluation (Signed)
SLP Cancellation Note  Patient Details Name: Thomas [name] MRN: [id number] DOB: 10/12/1961   Cancelled treatment:       Reason Eval/Treat Not Completed: Other (comment) (pt npo for possible GI procedure per RN, RN reports pt tolerating limited po he can accept, will continue efforts)   Macario Golds 09/24/2017, 12:19 PM Luanna Salk, [facility] SLP [phone]

## 2017-09-24 NOTE — Progress Notes (Signed)
CB from Dr. Maudie Mercury with order to do I&O cath. Will c/t monitor.

## 2017-09-24 NOTE — Progress Notes (Signed)
Patient more alert this am and answering simple questions. Has not voided, but has been lethargic through the night and fluids are ordered only at [facility]. Pt has been NPO and prior to coming in to hospital had not taken in much and was not voiding much. Per wife is difficult to do a catheter on. Bladder scan 330 cc showing. Will page MD on call for further orders.

## 2017-09-24 NOTE — Care Management Note (Signed)
Case Management Note  Patient Details  Name: Thomas [name] MRN: [id number] Date of Birth: August 15, 1961  Subjective/Objective:                  Urosepsis and aki  Action/Plan: Date:  September 24, 2017 Chart reviewed for concurrent status and case management needs.  Will continue to follow patient progress.  Discharge Planning: following for needs  Expected discharge date: September 27, 2017  Velva Harman, BSN, [place], [place]   Expected Discharge Date:                  Expected Discharge Plan:  Home/Self Care  In-House Referral:     Discharge planning Services  CM Consult  Post Acute Care Choice:    Choice offered to:     DME Arranged:    DME Agency:     HH Arranged:    HH Agency:     Status of Service:  In process, will continue to follow  If discussed at Long Length of Stay Meetings, dates discussed:    Additional Comments:  Leeroy Cha, RN 09/24/2017, 9:36 AM

## 2017-09-24 NOTE — Progress Notes (Signed)
In and out Cath done with 300cc dark amber received. Urine sample sent. Pt tolerated fair. Patient is more alert this am but still lethargic but will answer some questions and recognizes wife. Will c/t monitor.

## 2017-09-24 NOTE — Consult Note (Signed)
Consultation  Referring Provider: [facility] hospitalist/[place] Primary Care Physician:  Alroy Dust, L.Marlou Sa, MD Primary Gastroenterologist:  Dr Hilarie Fredrickson.  Reason for Consultation:  Decompensated Cirrhosis  HPI: Thomas [name] is a 56 y.o. male well known to Dr. Hilarie Fredrickson with severely decompensated alcoholic cirrhosis complicated by portal hypertension, ascites, hepatic hydrothorax, severe portal gastropathy, recurrent severe anemia requiring transfusions and recurrent hepatic encephalopathy. He has prior history of SBP and is maintained on chronic Cipro. He has had multiple recent hospitalizations and has required repeated paracenteses and intermittent thoracenteses. He has been established with [facility] and has transplant evaluation appointment later this week. He was seen in the office on 09/20/2017 in follow-up and at that time was mentating very well. He and his wife were looking forward to completing his outpatient counseling appointments and transplant evaluation for this week. Patient's wife states that he was doing well on Friday, and by Saturday evening seemed to be "zoning out" a bit. Yesterday morning on awakening he was very somnolent and remained that way throughout the day. She was able to get his meds in him and his Chronulac. He did have an episode of coughing and vomited once. She does not feel that he choked or obviously aspirated and he was already somnolent at the time this happened. Patient was brought to the emergency room where her ammonia level was found to be 136. He remains obtunded this morning,not arousing to voice and not attempting to speak. Labs have been reviewed and are fairly stable for him hemoglobin was back down to 7. He received 1 unit of packed RBCs last night, hemoglobin pending this morning. Creatinine up to 2.78 and urine sodium less than 10.Marland Kitchen He has not been on any diuretics over the past few weeks. Diagnostic paracentesis done yesterday shows no evidence of  SBP Chest x-ray 09/23/2017 shows enlarged right pleural effusion, suspected right lower lobe airspace consolidation versus atelectasis  He has had no complaints at home over the past few days no cough, sputum production, fever or chills.   Past Medical History:  Diagnosis Date  . Alcohol abuse 2013  . Alcoholic cirrhosis ([place]) 05/1606  . Anemia 02/2016   in setting of GI blood loss, but MCV macrocytic.   . Coagulopathy ([place]) 05/2015  . Diverticulosis 09/2015  . Esophageal varices ([place]) 09/2015   small  . GERD (gastroesophageal reflux disease)   . Hyperlipidemia   . Hypertension   . Pneumonia   . Portal hypertension ([place]) 09/2015   with portal gastropathy  . Thrombocytopenia ([place]) 02/2016  . Tubular adenoma of colon 09/2015    Past Surgical History:  Procedure Laterality Date  . ESOPHAGOGASTRODUODENOSCOPY N/A 03/05/2016   Procedure: ESOPHAGOGASTRODUODENOSCOPY (EGD);  Surgeon: Ladene Artist, MD;  Location: [name];  Service: Endoscopy;  Laterality: N/A;  . ESOPHAGOGASTRODUODENOSCOPY N/A 04/29/2017   Procedure: ESOPHAGOGASTRODUODENOSCOPY (EGD);  Surgeon: Milus Banister, MD;  Location: [name];  Service: Endoscopy;  Laterality: N/A;  . ESOPHAGOGASTRODUODENOSCOPY (EGD) WITH PROPOFOL N/A 03/29/2017   Procedure: ESOPHAGOGASTRODUODENOSCOPY (EGD) WITH PROPOFOL;  Surgeon: Doran Stabler, MD;  Location: [facility] ENDOSCOPY;  Service: Endoscopy;  Laterality: N/A;  . IR PARACENTESIS  05/15/2017  . IR PARACENTESIS  06/11/2017  . IR PARACENTESIS  06/26/2017  . IR PARACENTESIS  07/10/2017  . IR PARACENTESIS  08/16/2017  . IR PARACENTESIS  09/14/2017  . IR THORACENTESIS ASP PLEURAL SPACE W/IMG GUIDE  06/28/2017  . testicle prosthesis      Prior to Admission medications   Medication Sig  Start Date End Date Taking? Authorizing Provider  benzonatate (TESSALON) 100 MG capsule Take 1 capsule (100 mg total) by mouth 2 (two) times daily. Patient taking differently: Take 100 mg by mouth 2 (two) times  daily as needed for cough.  09/07/17  Yes Robbie Lis, MD  bismuth subsalicylate (PEPTO BISMOL) 262 MG chewable tablet Chew 262-524 mg by mouth 3 (three) times daily as needed for indigestion.   Yes [provider]  calcium carbonate (TUMS EX) 750 MG chewable tablet Chew 1-2 tablets by mouth 3 (three) times daily as needed for heartburn.   Yes [provider]  calcium-vitamin D (OSCAL WITH D) 500-200 MG-UNIT tablet Take 1 tablet by mouth 2 (two) times daily.   Yes [provider]  ciprofloxacin (CIPRO) 500 MG tablet TAKE 1 TABLET BY MOUTH EVERY EVENING 08/20/17  Yes Pyrtle, Lajuan Lines, MD  Dextromethorphan HBr (TUSSIN COUGH PO) Take 2 capsules by mouth at bedtime.    Yes [provider]  ergocalciferol (VITAMIN D2) 50000 units capsule Take 50,000 Units by mouth once a week.   Yes [provider]  ferrous sulfate 325 (65 FE) MG tablet Take 1 tablet (325 mg total) by mouth 3 (three) times daily with meals. Patient taking differently: Take 325 mg by mouth every evening.  04/30/17  Yes Patrecia Pour, Christean Grief, MD  folic acid (FOLVITE) 1 MG tablet TAKE 1 TABLET BY MOUTH AT BEDTIME 08/20/17  Yes Pyrtle, Lajuan Lines, MD  lactulose (CEPHULAC) 20 g packet Take 1 packet (20 g total) by mouth 3 (three) times daily. Patient taking differently: Take 20 g by mouth 2 (two) times daily.  08/25/17  Yes Regalado, Belkys A, MD  magnesium oxide (MAG-OX) 400 (241.3 Mg) MG tablet Take 1/2 tablet twice daily. 09/20/17  Yes Esterwood, Amy S, PA-C  pantoprazole (PROTONIX) 40 MG tablet TAKE 1 TABLET (40 MG TOTAL) BY MOUTH 2 (TWO) TIMES DAILY. 07/06/17  Yes Pyrtle, Lajuan Lines, MD  propranolol (INDERAL) 20 MG tablet Take 1 tablet (20 mg total) by mouth 2 (two) times daily. Patient taking differently: Take 20 mg by mouth daily.  09/21/17  Yes Pyrtle, Lajuan Lines, MD  sucralfate (CARAFATE) 1 g tablet TAKE 1 TABLET BY MOUTH 4 TIMES DAILY 08/23/17  Yes Pyrtle, Lajuan Lines, MD  thiamine 100 MG tablet Take 1 tablet (100 mg  total) by mouth daily. 09/20/17  Yes Esterwood, Amy S, PA-C  furosemide (LASIX) 20 MG tablet Take 40 mg by mouth 2 (two) times daily.    [provider]  spironolactone (ALDACTONE) 100 MG tablet Take 100 mg by mouth daily.    [provider]    Current Facility-Administered Medications  Medication Dose Route Frequency Provider Last Rate Last Dose  . 0.9 %  sodium chloride infusion   Intravenous Once Elodia Florence., MD      . Ampicillin-Sulbactam (UNASYN) 3 g in sodium chloride 0.9 % 100 mL IVPB  3 g Intravenous Once Elodia Florence., MD      . Ampicillin-Sulbactam (UNASYN) 3 g in sodium chloride 0.9 % 100 mL IVPB  3 g Intravenous Q6H Elodia Florence., MD   Stopped at 09/24/17 [phone]  . bismuth subsalicylate (PEPTO BISMOL) chewable tablet 262-524 mg  262-524 mg Oral TID PRN Elodia Florence., MD      . ciprofloxacin (CIPRO) tablet 500 mg  500 mg Oral QPM Elodia Florence., MD   500 mg at 09/23/17 1800  . ferrous  sulfate tablet 325 mg  325 mg Oral TID WC Elodia Florence., MD   325 mg at 09/23/17 1800  . folic acid (FOLVITE) tablet 1 mg  1 mg Oral QHS Elodia Florence., MD   1 mg at 09/23/17 2101  . lactulose (CHRONULAC) 10 GM/15ML solution 30 g  30 g Oral QID Elodia Florence., MD   30 g at 09/23/17 2101  . magnesium oxide (MAG-OX) tablet 200 mg  200 mg Oral BID Elodia Florence., MD   200 mg at 09/23/17 2101  . pantoprazole (PROTONIX) EC tablet 40 mg  40 mg Oral Daily Elodia Florence., MD   40 mg at 09/23/17 1801  . sucralfate (CARAFATE) tablet 1 g  1 g Oral Q6H Elodia Florence., MD   1 g at 09/24/17 0544  . thiamine (VITAMIN B-1) tablet 100 mg  100 mg Oral Daily Elodia Florence., MD   100 mg at 09/23/17 1801    Allergies as of 09/23/2017 - Review Complete 09/23/2017  Allergen Reaction Noted  . Xifaxan [rifaximin] Swelling and Other (See Comments) 11/25/2016    Family History  Problem Relation Age of Onset    . Diabetes Father   . Parkinson's disease Father   . Diabetes Paternal Aunt   . Glaucoma Mother     Social History   Social History  . Marital status: Married    Spouse name: Lelan Pons  . Number of children: 3  . Years of education: N/A   Occupational History  . Sales    Social History Main Topics  . Smoking status: Never Smoker  . Smokeless tobacco: Never Used  . Alcohol use No     Comment: Quit 2/17, relapse in 1/18 for short period of time  . Drug use: No  . Sexual activity: Not Currently   Other Topics Concern  . Not on file   Social History Narrative   Lives with wife.  Normally able to ambulate without assistance.    Review of Systems: Pertinent positive and negative review of systems were noted in the above HPI section.  All other review of systems was otherwise negative.Marland Kitchen  Physical Exam: Vital signs in last 24 hours: Temp:  [97.3 F (36.3 C)-98.5 F (36.9 C)] 98.5 F (36.9 C) (10/15 0524) Pulse Rate:  [59-95] 63 (10/15 0524) Resp:  [19-23] 20 (10/15 0524) BP: (92-110)/(54-63) 110/59 (10/15 0524) SpO2:  [96 %-100 %] 98 % (10/15 0524) Last BM Date: 09/23/17 General:   Alert,  Well-developed, well-nourished, somnolent white male, jaundiced, did not arouse to voice Head:  Normocephalic and atraumatic. Eyes:  Sclera Icteric.   Conjunctiva pink. Ears:  Normal auditory acuity. Nose:  No deformity, discharge,  or lesions. Mouth:  No deformity or lesions.   Neck:  Supple; no masses or thyromegaly. Lungs:  Decreased breath sounds right lung Heart:  Regular rate and rhythm; no murmurs, clicks, rubs,  or gallops. Abdomen:  Non-tense ascites BS active,nonpalp mass or hsm.  nontender Rectal:  Deferred  Msk:  Symmetrical without gross deformities. . Pulses:  Normal pulses noted. Extremities:  Without clubbing or edema. Neurologic:  Somnolent, obtunded positive asterixis. Skin:  Intact without significant lesions or rashes.. Psych:  somnolent  Intake/Output from  previous day: 10/14 0701 - 10/15 0700 In: 558 [Blood:358; IV Piggyback:200] Out: 300 [Urine:300] Intake/Output this shift: No intake/output data recorded.  Lab Results:  Recent Labs  09/23/17 0959 09/24/17 0606  WBC 5.9 6.4  HGB 7.0* 8.6*  HCT 20.0* 24.5*  PLT 86* 77*   BMET  Recent Labs  09/23/17 0959 09/24/17 0606  NA 134* 136  K 4.4 4.2  CL 101 105  CO2 24 23  GLUCOSE 121* 105*  BUN 22* 25*  CREATININE 2.44* 2.78*  CALCIUM 8.3* 8.6*   LFT  Recent Labs  09/24/17 0606  PROT 5.4*  ALBUMIN 2.4*  AST 58*  ALT 34  ALKPHOS 79  BILITOT 8.5*   PT/INR  Recent Labs  09/23/17 0959 09/24/17 0606  LABPROT 29.2* 27.8*  INR 2.79 2.63       IMPRESSION:  #62 56 year old white male with severely decompensated alcoholic cirrhosis who is undergoing transplant evaluation at [place] recent admissions Patient readmitted last night with significant encephalopathy which occurred over a 12-18 hour period. Precipitating events not clear, he may have a right lower lobe pneumonia underlying enlarged right pleural effusion. I'm concerned he is developing hepatorenal syndrome which may have triggered the worsening encephalopathy Patient has chronic encephalopathy and very little reserve, and possibly may have tipped over without inciting event #2 worsening renal insufficiency, urine sodium less than 10-worrisome for hepatorenal syndrome-He has been off diuretics over the past 3-4 weeks #3 acute on chronic anemia-patient has had ongoing GI blood loss felt secondary to portal gastropathy, he has been requiring transfusions over the past few weeks #4 history of SBP-on prophylaxis with Cipro-no evidence of SBP by diagnostic tap last night  MELD = 35 MELD Na =35    PLAN: #1 Will increase lactulose to 45 g by mouth 4 times daily until encephalopathy significantly improves Will also start lactulose enemas twice a day. Patient is allergic to Xifaxan #2 continue  Unasyn #3 transfuse to keep hemoglobin 7 #4 patient and wife had been desperately trying to finish his counseling appointments, he has an appointment tomorrow with Dr. Sheryn Bison health to complete his fourth session. They also have transplant evaluation at [facility] on Wednesday, 09/26/2017. Will discuss with Dr. Hilarie Fredrickson possibility of transferring patient unless he improves very quickly over the next 24 hours. #5 cancel speech path evaluation if this was ordered, his swallowing is normal when he is not severely encephalopathic #6 Renal consult today-will start Midodrine , octreotide, albumin.   I have discussed with Dr Hilarie Fredrickson  who agrees that transfer to [facility] is  Imperative . He has contacted pt's  Transplant Hepatologist - Dr . Marcille Buffy  who has accepted transfer when bed available.     Amy Esterwood  09/24/2017, 10:14 AM    ________________________________________________________________________  Velora Heckler GI MD note:  I personally examined the patient, reviewed the data and agree with the assessment and plan described above. Acutely worsening; may be due to RLL pneumonia which we are covering with unasyn. No real improvement in mental status.  He'll be getting oral lactulose, enema lactulose; HRS protocol (midodrine, octreotide and albumin).  He's been accepted to [facility], awaiting bed.  Will follow along.   Owens Loffler, MD [facility] Gastroenterology Pager [phone]

## 2017-09-24 NOTE — Progress Notes (Signed)
Report called to Christy Sartorius at [place].

## 2017-09-24 NOTE — Discharge Summary (Addendum)
Physician Discharge Summary  Thomas [name] PNT:[id number] DOB: Mar 25, 1961 DOA: 09/23/2017  PCP: Thomas [name], Thomas Sa, MD  Admit date: 09/23/2017 Discharge date: 09/24/2017  Admitted From: Home Disposition:  Transfer to [facility]  Discharge Condition:Stable CODE STATUS:Full Diet recommendation: Full liquid   Brief/Interim Summary: 56 y.o. male with medical history significant of alcoholic cirrhosis with end-stage liver disease being evaluated for transplant, anemia, GERD, portal hypertension presenting with confusion.  History is obtained with the assistance of his wife due to mental status.  Thomas [name] symptoms started this morning. He started to cough and then had an episode of emesis. There is no blood. He lives unassigned for a while after this and his wife noticed that he seemed out of it. The occurred around 6:30 this morning.  His wife denies that he's had any recent fevers, chills, chest pain, shortness of breath, abdominal pain. Yesterday he was pretty normal, although she did notice him positive go blank for second. She tried to give him extra doses of lactulose yesterday and on Friday due to an elevated ammonia that was seen in clinic, but he did not take these extra doses. She does not think he had a bowel movement yesterday.  Discharge Diagnoses:  Principal Problem:   Hepatic encephalopathy ([place]) Active Problems:   Acute kidney injury (HCC)   Thrombocytopenia (HCC)   Portal hypertensive gastropathy (HCC)   End stage liver disease (HCC)  Hepatic Encephalopathy:  Ammonia elevated on 10/11 to ~60's.  Today 136.  Was normal until this morning when he had episode of emesis x 1 after a coughing fit and then was more confused and altered.  No fevers, chills, or infectious sx.  -GI was consulted -Patient was continued on scheduled lactulose with one BM noted this AM -Would continue to monitor ammonia trends -Given concerns of hepatorenal syndrome, Nephrology consulted  initially. Given plans for transfer to [place], hold off on formal consultation at this time until pt is transferred  Nausea/Vomiting:  Occurred x 1 prior to his becoming more confused.  Will check KUB. Stable at present   Likely Aspiration Pneumonia: With coughing fit then N/V as above.  R sided pleural effusion and RLL consolidation vs atelectasis.  In setting of N/V above, possible aspiration and AMS above, pt was started on unasyn.  Currently on room air.  Bcx were ordered - Continued on minimal O2 support  Acute kidney injury on CKD: Elevated from baseline of around 1.3-1.5. To 2.4 on presentation.  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  Diuretics are currently being held. -Recommend close monitoring of renal function  Anemia:  Similar to prior admissions.  No overt bleeding.  Hx of portal gastropathathy described as severe and thought to be source of his chronic GI bleeding on 5/20 endoscopy.  Down from 7.9 at last d/c on 10/6 to 7.0 at time of admission - Patient was transfused 1 unit pRBC's - continue PPI, carafate -GI consulted and is following  Alcoholic Cirrhosis with ascites and hepatic encephalopathy  Portal gastropathy:  Meld 34.  Being evaluated for transplant.  -Appreciate input by GI. Thomas [name] has spoken with Thomas [name] who has accepted patient in transfer to [facility].  Hypotension: Pt noted to have chronically low BP's.   Thrombocytopenia: likely 2/2 above  Prolonged QT: caution with qt prolonging meds -Remained stable thus far  Stage 3 ckd  Acute blood loss anemia Discharge Instructions   Allergies as of 09/24/2017  Reactions   Xifaxan [rifaximin] Swelling, Other (See Comments)   Reaction:  All over body swelling       Medication List    STOP taking these medications   ciprofloxacin 500 MG tablet Commonly known as:  CIPRO   furosemide 20 MG tablet Commonly known as:  LASIX   propranolol 20 MG  tablet Commonly known as:  INDERAL   spironolactone 100 MG tablet Commonly known as:  ALDACTONE     TAKE these medications   albumin human 25 % bottle Inject 300 mLs (75 g total) into the vein daily.   Ampicillin-Sulbactam 3 g in sodium chloride 0.9 % 100 mL Inject 3 g into the vein every 12 (twelve) hours.   benzonatate 100 MG capsule Commonly known as:  TESSALON Take 1 capsule (100 mg total) by mouth 2 (two) times daily. What changed:  when to take this  reasons to take this   bismuth subsalicylate 829 MG chewable tablet Commonly known as:  PEPTO BISMOL Chew 262-524 mg by mouth 3 (three) times daily as needed for indigestion.   calcium carbonate 750 MG chewable tablet Commonly known as:  TUMS EX Chew 1-2 tablets by mouth 3 (three) times daily as needed for heartburn.   calcium-vitamin D 500-200 MG-UNIT tablet Commonly known as:  OSCAL WITH D Take 1 tablet by mouth 2 (two) times daily.   ergocalciferol 50000 units capsule Commonly known as:  VITAMIN D2 Take 50,000 Units by mouth once a week.   ferrous sulfate 325 (65 FE) MG tablet Take 1 tablet (325 mg total) by mouth 3 (three) times daily with meals. What changed:  when to take this   folic acid 1 MG tablet Commonly known as:  FOLVITE TAKE 1 TABLET BY MOUTH AT BEDTIME   lactulose 20 g packet Commonly known as:  CEPHULAC Take 1 packet (20 g total) by mouth 3 (three) times daily. What changed:  when to take this   magnesium oxide 400 (241.3 Mg) MG tablet Commonly known as:  MAG-OX Take 1/2 tablet twice daily.   midodrine 2.5 MG tablet Commonly known as:  PROAMATINE Take 3 tablets (7.5 mg total) by mouth 3 (three) times daily with meals.   octreotide 500 mcg in sodium chloride 0.9 % 250 mL Inject 50 mcg/hr into the vein continuous.   pantoprazole 40 MG tablet Commonly known as:  PROTONIX TAKE 1 TABLET (40 MG TOTAL) BY MOUTH 2 (TWO) TIMES DAILY.   sucralfate 1 g tablet Commonly known as:   CARAFATE TAKE 1 TABLET BY MOUTH 4 TIMES DAILY   thiamine 100 MG tablet Take 1 tablet (100 mg total) by mouth daily.   TUSSIN COUGH PO Take 2 capsules by mouth at bedtime.      Follow-up Information    Thomas [name], Thomas Sa, MD. Schedule an appointment as soon as possible for a visit.   Specialty:  Family Medicine Contact information: [address] [place] [address] [phone]          Allergies  Allergen Reactions  . Xifaxan [Rifaximin] Swelling and Other (See Comments)    Reaction:  All over body swelling     Consultations:  GI  Procedures/Studies: Dg Chest 1 View  Result Date: 09/05/2017 CLINICAL DATA:  Post thoracentesis EXAM: CHEST 1 VIEW COMPARISON:  09/04/2017, 08/24/2017 FINDINGS: Decreased right pleural effusion post thoracentesis. No pneumothorax. Moderate residual effusion noted. Suspect tiny left effusion. Low lung volumes. Enlarged cardiomediastinal silhouette with central vascular congestion. Atelectasis or infiltrate at  the right middle lobe and right lung base. IMPRESSION: 1. Decreased right pleural effusion post thoracentesis with no pneumothorax. Moderate residual right pleural effusion noted 2. Consolidation in the right middle lobe and right lung base which may reflect atelectasis or pneumonia 3. Cardiomegaly with central vascular congestion. Electronically Signed   By: Donavan Foil M.D.   On: 09/05/2017 15:59   Dg Chest Portable 1 View  Result Date: 09/23/2017 CLINICAL DATA:  Lethargic.  Vomiting. EXAM: PORTABLE CHEST 1 VIEW COMPARISON:  09/05/2017 FINDINGS: The cardiac silhouette is enlarged. Mediastinal contours appear intact. There is no evidence of pneumothorax. Interval increase in right pleural effusion which occupies approximately 60% of the volume of the right hemithorax. Suspected right lower lobe airspace consolidation. Increased interstitial markings in the aerated lung. Osseous structures are without acute abnormality. Soft  tissues are grossly normal. IMPRESSION: Persistent enlargement of the cardiac silhouette with pulmonary vascular congestion. Enlarged right pleural effusion. Suspected right lower lobe airspace consolidation versus atelectasis. Electronically Signed   By: Fidela Salisbury M.D.   On: 09/23/2017 10:08   Dg Abd 2 Views  Result Date: 09/23/2017 CLINICAL DATA:  Vomiting.  Lethargy. EXAM: ABDOMEN - 2 VIEW COMPARISON:  None. FINDINGS: The bowel gas pattern is normal. There is no evidence of free air. Probable small volume ascites. No radio-opaque calculi or other significant radiographic abnormality is seen. IMPRESSION: Nonobstructive bowel gas pattern. Probable small volume ascites. Electronically Signed   By: Fidela Salisbury M.D.   On: 09/23/2017 14:51   Dg Abd Acute W/chest  Result Date: 09/04/2017 CLINICAL DATA:  Initial evaluation for acute cough, nausea and vomiting. EXAM: DG ABDOMEN ACUTE W/ 1V CHEST COMPARISON:  Prior radiograph from 08/24/2017. FINDINGS: Mild cardiomegaly, stable. Mediastinal silhouette within normal limits. There has been reaccumulation of a right pleural effusion, now large in size, extending superiorly over the right lung apex. Associated volume loss/atelectasis within the right lung. Left lung is grossly clear. Underlying pulmonary vascular congestion without frank pulmonary edema. No pneumothorax. Visualized bowel gas pattern within normal limits without evidence for obstruction or ileus. No appreciable abnormal bowel wall thickening. No free air seen on decubitus view. Calcified gallstone overlies the right upper quadrant. Additional calcific density overlying the left sacrum of uncertain etiology, but may reflect a calcified diverticulum. No acute osseous abnormality. IMPRESSION: 1. Re-accumulation of a large right pleural effusion with associated right lung volume loss. 2. Underlying pulmonary vascular congestion without frank pulmonary edema. 3. Nonobstructive bowel gas  pattern. No radiographic evidence for acute intra-abdominal pathology. 4. Cholelithiasis. Electronically Signed   By: Jeannine Boga M.D.   On: 09/04/2017 22:36   Ir Paracentesis  Result Date: 09/14/2017 INDICATION: Abdominal distention secondary recurrent ascites. Request for diagnostic and therapeutic paracentesis. EXAM: ULTRASOUND GUIDED LEFT LOWER QUADRANT PARACENTESIS MEDICATIONS: None. COMPLICATIONS: None immediate. PROCEDURE: Informed written consent was obtained from the patient after a discussion of the risks, benefits and alternatives to treatment. A timeout was performed prior to the initiation of the procedure. Initial ultrasound scanning demonstrates a large amount of ascites within the the left lower abdominal quadrant. The left lower abdomen was prepped and draped in the usual sterile fashion. 1% lidocaine with epinephrine was used for local anesthesia. Following this, a 19 gauge, 7-cm, Yueh catheter was introduced. An ultrasound image was saved for documentation purposes. The paracentesis was performed. The catheter was removed and a dressing was applied. The patient tolerated the procedure well without immediate post procedural complication. FINDINGS: A total of approximately 3.75 L of clear  yellow fluid was removed. Samples were sent to the laboratory as requested by the clinical team. IMPRESSION: Successful ultrasound-guided paracentesis yielding 3.75 liters of peritoneal fluid. Read by: Ascencion Dike PA-C Electronically Signed   By: Jerilynn Mages.  Shick M.D.   On: 09/14/2017 15:04   US Thoracentesis Asp Pleural Space W/img Guide  Result Date: 09/05/2017 INDICATION: Cirrhosis, encephalopathy, hepatic hydrothorax, renal insufficiency; request made for diagnostic and therapeutic right thoracentesis. EXAM: ULTRASOUND GUIDED DIAGNOSTIC AND THERAPEUTIC RIGHT THORACENTESIS MEDICATIONS: None. COMPLICATIONS: None immediate. PROCEDURE: An ultrasound guided thoracentesis was thoroughly discussed with the  patient and questions answered. The benefits, risks, alternatives and complications were also discussed. The patient understands and wishes to proceed with the procedure. Written consent was obtained. Ultrasound was performed to localize and Harith an adequate pocket of fluid in the right chest. The area was then prepped and draped in the normal sterile fashion. 2% Lidocaine was used for local anesthesia. Under ultrasound guidance a Safe-T-Centesis catheter was introduced. Thoracentesis was performed. The catheter was removed and a dressing applied. FINDINGS: A total of approximately 1.1 liters of hazy, amber fluid was removed. Samples were sent to the laboratory as requested by the clinical team. Due to onset of patient coughing and hypotension only the above amount of fluid was removed today. IMPRESSION: Successful ultrasound guided diagnostic and therapeutic right thoracentesis yielding 1.1 liters of pleural fluid. Read by: Rowe Robert, PA-C Electronically Signed   By: Jacqulynn Cadet M.D.   On: 09/05/2017 16:08     Subjective: Unable to determine secondary to lethargy  Discharge Exam: Vitals:   09/24/17 0200 09/24/17 0524  BP: 109/63 (!) 110/59  Pulse: 95 63  Resp: (!) 21 20  Temp: 98 F (36.7 C) 98.5 F (36.9 C)  SpO2: 99% 98%   Vitals:   09/23/17 2300 09/23/17 2325 09/24/17 0200 09/24/17 0524  BP: (!) 92/56 (!) 97/58 109/63 (!) 110/59  Pulse: 60 60 95 63  Resp: 20 20 (!) 21 20  Temp: 98.3 F (36.8 C) 98.1 F (36.7 C) 98 F (36.7 C) 98.5 F (36.9 C)  TempSrc: Oral Oral Oral Oral  SpO2: 96% 99% 99% 98%  Height:        General: Pt asleep, somewhat arousable, not in acute distress Cardiovascular: RRR, S1/S2 +, no rubs, no gallops Respiratory: CTA bilaterally, no wheezing, no rhonchi Abdominal: Soft, NT, ND, bowel sounds + Extremities: no edema, no cyanosis   The results of significant diagnostics from this hospitalization (including imaging, microbiology, ancillary and  laboratory) are listed below for reference.     Microbiology: Recent Results (from the past 240 hour(s))  Body fluid culture     Status: None (Preliminary result)   Collection Time: 09/23/17 12:41 PM  Result Value Ref Range Status   Specimen Description PERITONEAL CAVITY  Final   Special Requests NONE  Final   Gram Stain   Final    FEW WBC PRESENT,BOTH PMN AND MONONUCLEAR NO ORGANISMS SEEN    Culture   Final    NO GROWTH 1 DAY Performed at [facility], [address], [place], [place] [address]    Report Status PENDING  Incomplete  Culture, blood (routine x 2)     Status: None (Preliminary result)   Collection Time: 09/23/17  4:36 PM  Result Value Ref Range Status   Specimen Description BLOOD BLOOD RIGHT HAND  Final   Special Requests IN PEDIATRIC BOTTLE Blood Culture adequate volume  Final   Culture   Final  NO GROWTH < 24 HOURS Performed at [facility] [address], [place], [place] [address]    Report Status PENDING  Incomplete  Culture, blood (routine x 2)     Status: None (Preliminary result)   Collection Time: 09/23/17  4:36 PM  Result Value Ref Range Status   Specimen Description BLOOD BLOOD LEFT HAND  Final   Special Requests IN PEDIATRIC BOTTLE Blood Culture adequate volume  Final   Culture   Final    NO GROWTH < 24 HOURS Performed at [facility], [place] [address], [place], [place] [address]    Report Status PENDING  Incomplete     Labs: BNP (last 3 results)  Recent Labs  03/27/17 2051 06/13/17 1856  BNP 106.7* 15.1   Basic Metabolic Panel:  Recent Labs Lab 09/20/17 1143 09/23/17 0959 09/24/17 0606  NA 133* 134* 136  K 4.0 4.4 4.2  CL 102 101 105  CO2 25 24 23   GLUCOSE 206* 121* 105*  BUN 21 22* 25*  CREATININE 1.72* 2.44* 2.78*  CALCIUM 8.0* 8.3* 8.6*  MG 2.2  --  2.3   Liver Function Tests:  Recent Labs Lab 09/20/17 1143 09/23/17 0959 09/24/17 0606  AST 52* 66* 58*  ALT 28 33 34  ALKPHOS 84 91 79  BILITOT  6.9* 5.4* 8.5*  PROT 5.5* 5.4* 5.4*  ALBUMIN 2.6* 2.5* 2.4*    Recent Labs Lab 09/23/17 1636  LIPASE 27    Recent Labs Lab 09/20/17 1143 09/23/17 0959  AMMONIA 63* 136*   CBC:  Recent Labs Lab 09/20/17 1143 09/23/17 0959 09/24/17 0606  WBC 5.3 5.9 6.4  NEUTROABS 3.5 4.0  --   HGB 7.9 Repeated and verified X2.* 7.0* 8.6*  HCT 23.3 Repeated and verified X2.* 20.0* 24.5*  MCV 105.5* 102.0* 99.6  PLT 88.0* 86* 77*   Cardiac Enzymes: No results for input(s): CKTOTAL, CKMB, CKMBINDEX, TROPONINI in the last 168 hours. BNP: Invalid input(s): POCBNP CBG: No results for input(s): GLUCAP in the last 168 hours. D-Dimer No results for input(s): DDIMER in the last 72 hours. Hgb A1c No results for input(s): HGBA1C in the last 72 hours. Lipid Profile No results for input(s): CHOL, HDL, LDLCALC, TRIG, CHOLHDL, LDLDIRECT in the last 72 hours. Thyroid function studies No results for input(s): TSH, T4TOTAL, T3FREE, THYROIDAB in the last 72 hours.  Invalid input(s): FREET3 Anemia work up No results for input(s): VITAMINB12, FOLATE, FERRITIN, TIBC, IRON, RETICCTPCT in the last 72 hours. Urinalysis    Component Value Date/Time   COLORURINE AMBER (A) 09/24/2017 0645   APPEARANCEUR CLOUDY (A) 09/24/2017 0645   LABSPEC 1.018 09/24/2017 0645   PHURINE 5.0 09/24/2017 0645   GLUCOSEU NEGATIVE 09/24/2017 0645   HGBUR SMALL (A) 09/24/2017 0645   BILIRUBINUR NEGATIVE 09/24/2017 0645   KETONESUR NEGATIVE 09/24/2017 0645   PROTEINUR NEGATIVE 09/24/2017 0645   UROBILINOGEN 0.2 11/22/2012 0856   NITRITE NEGATIVE 09/24/2017 0645   LEUKOCYTESUR MODERATE (A) 09/24/2017 0645   Sepsis Labs Invalid input(s): PROCALCITONIN,  WBC,  LACTICIDVEN Microbiology Recent Results (from the past 240 hour(s))  Body fluid culture     Status: None (Preliminary result)   Collection Time: 09/23/17 12:41 PM  Result Value Ref Range Status   Specimen Description PERITONEAL CAVITY  Final   Special Requests  NONE  Final   Gram Stain   Final    FEW WBC PRESENT,BOTH PMN AND MONONUCLEAR NO ORGANISMS SEEN    Culture   Final  NO GROWTH 1 DAY Performed at [facility], [place] [address] [place], [place] [address]    Report Status PENDING  Incomplete  Culture, blood (routine x 2)     Status: None (Preliminary result)   Collection Time: 09/23/17  4:36 PM  Result Value Ref Range Status   Specimen Description BLOOD BLOOD RIGHT HAND  Final   Special Requests IN PEDIATRIC BOTTLE Blood Culture adequate volume  Final   Culture   Final    NO GROWTH < 24 HOURS Performed at [facility], [place] [address], [place], [place] [address]    Report Status PENDING  Incomplete  Culture, blood (routine x 2)     Status: None (Preliminary result)   Collection Time: 09/23/17  4:36 PM  Result Value Ref Range Status   Specimen Description BLOOD BLOOD LEFT HAND  Final   Special Requests IN PEDIATRIC BOTTLE Blood Culture adequate volume  Final   Culture   Final    NO GROWTH < 24 HOURS Performed at [facility], [place] [address], [place],  [address]    Report Status PENDING  Incomplete     SIGNED:   Perle Brickhouse, Orpah Melter, MD  [facility] 09/24/2017, 1:43 PM  If 7PM-7AM, please contact night-coverage [email or web address] Password [email or web address]

## 2017-09-25 ENCOUNTER — Ambulatory Visit: Payer: BLUE CROSS/BLUE SHIELD | Admitting: Psychiatry

## 2017-09-25 NOTE — Progress Notes (Signed)
Pt discharged to [facility] in stable condition.  No changes in condition; transported via [facility].

## 2017-09-26 DIAGNOSIS — R918 Other nonspecific abnormal finding of lung field: Secondary | ICD-10-CM | POA: Diagnosis not present

## 2017-09-26 DIAGNOSIS — R1312 Dysphagia, oropharyngeal phase: Secondary | ICD-10-CM | POA: Diagnosis not present

## 2017-09-26 DIAGNOSIS — Z9981 Dependence on supplemental oxygen: Secondary | ICD-10-CM | POA: Diagnosis not present

## 2017-09-26 DIAGNOSIS — J90 Pleural effusion, not elsewhere classified: Secondary | ICD-10-CM | POA: Diagnosis not present

## 2017-09-26 DIAGNOSIS — K7290 Hepatic failure, unspecified without coma: Secondary | ICD-10-CM | POA: Diagnosis not present

## 2017-09-26 DIAGNOSIS — I517 Cardiomegaly: Secondary | ICD-10-CM | POA: Diagnosis not present

## 2017-09-26 DIAGNOSIS — E8770 Fluid overload, unspecified: Secondary | ICD-10-CM | POA: Diagnosis not present

## 2017-09-26 DIAGNOSIS — J9601 Acute respiratory failure with hypoxia: Secondary | ICD-10-CM | POA: Diagnosis not present

## 2017-09-26 DIAGNOSIS — R0902 Hypoxemia: Secondary | ICD-10-CM | POA: Diagnosis not present

## 2017-09-26 DIAGNOSIS — N179 Acute kidney failure, unspecified: Secondary | ICD-10-CM | POA: Diagnosis not present

## 2017-09-26 LAB — BODY FLUID CULTURE: Culture: NO GROWTH

## 2017-09-27 DIAGNOSIS — J9601 Acute respiratory failure with hypoxia: Secondary | ICD-10-CM | POA: Diagnosis not present

## 2017-09-27 DIAGNOSIS — E8770 Fluid overload, unspecified: Secondary | ICD-10-CM | POA: Diagnosis not present

## 2017-09-27 DIAGNOSIS — J90 Pleural effusion, not elsewhere classified: Secondary | ICD-10-CM | POA: Diagnosis not present

## 2017-09-27 DIAGNOSIS — K7031 Alcoholic cirrhosis of liver with ascites: Secondary | ICD-10-CM | POA: Diagnosis not present

## 2017-09-27 DIAGNOSIS — J9600 Acute respiratory failure, unspecified whether with hypoxia or hypercapnia: Secondary | ICD-10-CM | POA: Diagnosis not present

## 2017-09-27 DIAGNOSIS — R1312 Dysphagia, oropharyngeal phase: Secondary | ICD-10-CM | POA: Diagnosis not present

## 2017-09-27 DIAGNOSIS — F1021 Alcohol dependence, in remission: Secondary | ICD-10-CM | POA: Diagnosis not present

## 2017-09-27 DIAGNOSIS — K7290 Hepatic failure, unspecified without coma: Secondary | ICD-10-CM | POA: Diagnosis not present

## 2017-09-27 DIAGNOSIS — N179 Acute kidney failure, unspecified: Secondary | ICD-10-CM | POA: Diagnosis not present

## 2017-09-28 DIAGNOSIS — R1312 Dysphagia, oropharyngeal phase: Secondary | ICD-10-CM | POA: Diagnosis not present

## 2017-09-28 DIAGNOSIS — J9601 Acute respiratory failure with hypoxia: Secondary | ICD-10-CM | POA: Diagnosis not present

## 2017-09-28 DIAGNOSIS — K7290 Hepatic failure, unspecified without coma: Secondary | ICD-10-CM | POA: Diagnosis not present

## 2017-09-28 DIAGNOSIS — E8770 Fluid overload, unspecified: Secondary | ICD-10-CM | POA: Diagnosis not present

## 2017-09-28 DIAGNOSIS — R918 Other nonspecific abnormal finding of lung field: Secondary | ICD-10-CM | POA: Diagnosis not present

## 2017-09-28 DIAGNOSIS — N179 Acute kidney failure, unspecified: Secondary | ICD-10-CM | POA: Diagnosis not present

## 2017-09-28 DIAGNOSIS — J90 Pleural effusion, not elsewhere classified: Secondary | ICD-10-CM | POA: Diagnosis not present

## 2017-09-28 LAB — CULTURE, BLOOD (ROUTINE X 2)
CULTURE: NO GROWTH
CULTURE: NO GROWTH
SPECIAL REQUESTS: ADEQUATE
Special Requests: ADEQUATE

## 2017-09-29 DIAGNOSIS — N179 Acute kidney failure, unspecified: Secondary | ICD-10-CM | POA: Diagnosis not present

## 2017-09-29 DIAGNOSIS — J9601 Acute respiratory failure with hypoxia: Secondary | ICD-10-CM | POA: Diagnosis not present

## 2017-09-29 DIAGNOSIS — F1021 Alcohol dependence, in remission: Secondary | ICD-10-CM | POA: Diagnosis not present

## 2017-09-29 DIAGNOSIS — J90 Pleural effusion, not elsewhere classified: Secondary | ICD-10-CM | POA: Diagnosis not present

## 2017-09-29 DIAGNOSIS — J9811 Atelectasis: Secondary | ICD-10-CM | POA: Diagnosis not present

## 2017-09-29 DIAGNOSIS — J811 Chronic pulmonary edema: Secondary | ICD-10-CM | POA: Diagnosis not present

## 2017-09-29 DIAGNOSIS — R918 Other nonspecific abnormal finding of lung field: Secondary | ICD-10-CM | POA: Diagnosis not present

## 2017-09-29 DIAGNOSIS — Z01818 Encounter for other preprocedural examination: Secondary | ICD-10-CM | POA: Diagnosis not present

## 2017-09-29 DIAGNOSIS — K7031 Alcoholic cirrhosis of liver with ascites: Secondary | ICD-10-CM | POA: Diagnosis not present

## 2017-09-30 DIAGNOSIS — N179 Acute kidney failure, unspecified: Secondary | ICD-10-CM | POA: Diagnosis not present

## 2017-09-30 DIAGNOSIS — J9601 Acute respiratory failure with hypoxia: Secondary | ICD-10-CM | POA: Diagnosis not present

## 2017-09-30 DIAGNOSIS — Z01818 Encounter for other preprocedural examination: Secondary | ICD-10-CM | POA: Diagnosis not present

## 2017-09-30 DIAGNOSIS — F1021 Alcohol dependence, in remission: Secondary | ICD-10-CM | POA: Diagnosis not present

## 2017-09-30 DIAGNOSIS — K7031 Alcoholic cirrhosis of liver with ascites: Secondary | ICD-10-CM | POA: Diagnosis not present

## 2017-09-30 DIAGNOSIS — Z119 Encounter for screening for infectious and parasitic diseases, unspecified: Secondary | ICD-10-CM | POA: Diagnosis not present

## 2017-09-30 DIAGNOSIS — Z4682 Encounter for fitting and adjustment of non-vascular catheter: Secondary | ICD-10-CM | POA: Diagnosis not present

## 2017-09-30 DIAGNOSIS — Z4659 Encounter for fitting and adjustment of other gastrointestinal appliance and device: Secondary | ICD-10-CM | POA: Diagnosis not present

## 2017-09-30 DIAGNOSIS — R1312 Dysphagia, oropharyngeal phase: Secondary | ICD-10-CM | POA: Diagnosis not present

## 2017-09-30 DIAGNOSIS — K7290 Hepatic failure, unspecified without coma: Secondary | ICD-10-CM | POA: Diagnosis not present

## 2017-10-01 DIAGNOSIS — K766 Portal hypertension: Secondary | ICD-10-CM | POA: Diagnosis not present

## 2017-10-01 DIAGNOSIS — R918 Other nonspecific abnormal finding of lung field: Secondary | ICD-10-CM | POA: Diagnosis not present

## 2017-10-01 DIAGNOSIS — R1312 Dysphagia, oropharyngeal phase: Secondary | ICD-10-CM | POA: Diagnosis not present

## 2017-10-01 DIAGNOSIS — Z01818 Encounter for other preprocedural examination: Secondary | ICD-10-CM | POA: Diagnosis not present

## 2017-10-01 DIAGNOSIS — Z4659 Encounter for fitting and adjustment of other gastrointestinal appliance and device: Secondary | ICD-10-CM | POA: Diagnosis not present

## 2017-10-01 DIAGNOSIS — I517 Cardiomegaly: Secondary | ICD-10-CM | POA: Diagnosis not present

## 2017-10-01 DIAGNOSIS — J90 Pleural effusion, not elsewhere classified: Secondary | ICD-10-CM | POA: Diagnosis not present

## 2017-10-01 DIAGNOSIS — K3189 Other diseases of stomach and duodenum: Secondary | ICD-10-CM | POA: Diagnosis not present

## 2017-10-01 DIAGNOSIS — K7290 Hepatic failure, unspecified without coma: Secondary | ICD-10-CM | POA: Diagnosis not present

## 2017-10-01 DIAGNOSIS — K7031 Alcoholic cirrhosis of liver with ascites: Secondary | ICD-10-CM | POA: Diagnosis not present

## 2017-10-01 DIAGNOSIS — N179 Acute kidney failure, unspecified: Secondary | ICD-10-CM | POA: Diagnosis not present

## 2017-10-01 DIAGNOSIS — J9601 Acute respiratory failure with hypoxia: Secondary | ICD-10-CM | POA: Diagnosis not present

## 2017-10-01 DIAGNOSIS — Z119 Encounter for screening for infectious and parasitic diseases, unspecified: Secondary | ICD-10-CM | POA: Diagnosis not present

## 2017-10-01 DIAGNOSIS — Z4682 Encounter for fitting and adjustment of non-vascular catheter: Secondary | ICD-10-CM | POA: Diagnosis not present

## 2017-10-01 DIAGNOSIS — K922 Gastrointestinal hemorrhage, unspecified: Secondary | ICD-10-CM | POA: Diagnosis not present

## 2017-10-01 DIAGNOSIS — K920 Hematemesis: Secondary | ICD-10-CM | POA: Diagnosis not present

## 2017-10-02 DIAGNOSIS — K922 Gastrointestinal hemorrhage, unspecified: Secondary | ICD-10-CM | POA: Diagnosis not present

## 2017-10-02 DIAGNOSIS — J9601 Acute respiratory failure with hypoxia: Secondary | ICD-10-CM | POA: Diagnosis not present

## 2017-10-02 DIAGNOSIS — Z01818 Encounter for other preprocedural examination: Secondary | ICD-10-CM | POA: Diagnosis not present

## 2017-10-02 DIAGNOSIS — R1312 Dysphagia, oropharyngeal phase: Secondary | ICD-10-CM | POA: Diagnosis not present

## 2017-10-02 DIAGNOSIS — R918 Other nonspecific abnormal finding of lung field: Secondary | ICD-10-CM | POA: Diagnosis not present

## 2017-10-02 DIAGNOSIS — Z4682 Encounter for fitting and adjustment of non-vascular catheter: Secondary | ICD-10-CM | POA: Diagnosis not present

## 2017-10-02 DIAGNOSIS — Z452 Encounter for adjustment and management of vascular access device: Secondary | ICD-10-CM | POA: Diagnosis not present

## 2017-10-02 DIAGNOSIS — K7290 Hepatic failure, unspecified without coma: Secondary | ICD-10-CM | POA: Diagnosis not present

## 2017-10-02 DIAGNOSIS — K7031 Alcoholic cirrhosis of liver with ascites: Secondary | ICD-10-CM | POA: Diagnosis not present

## 2017-10-02 DIAGNOSIS — Z119 Encounter for screening for infectious and parasitic diseases, unspecified: Secondary | ICD-10-CM | POA: Diagnosis not present

## 2017-10-03 DIAGNOSIS — Z119 Encounter for screening for infectious and parasitic diseases, unspecified: Secondary | ICD-10-CM | POA: Diagnosis not present

## 2017-10-03 DIAGNOSIS — Z01818 Encounter for other preprocedural examination: Secondary | ICD-10-CM | POA: Diagnosis not present

## 2017-10-03 DIAGNOSIS — F1021 Alcohol dependence, in remission: Secondary | ICD-10-CM | POA: Diagnosis not present

## 2017-10-03 DIAGNOSIS — K7290 Hepatic failure, unspecified without coma: Secondary | ICD-10-CM | POA: Diagnosis not present

## 2017-10-03 DIAGNOSIS — R5381 Other malaise: Secondary | ICD-10-CM | POA: Diagnosis not present

## 2017-10-03 DIAGNOSIS — K7031 Alcoholic cirrhosis of liver with ascites: Secondary | ICD-10-CM | POA: Diagnosis not present

## 2017-10-03 DIAGNOSIS — K922 Gastrointestinal hemorrhage, unspecified: Secondary | ICD-10-CM | POA: Diagnosis not present

## 2017-10-03 DIAGNOSIS — N179 Acute kidney failure, unspecified: Secondary | ICD-10-CM | POA: Diagnosis not present

## 2017-10-03 DIAGNOSIS — J9601 Acute respiratory failure with hypoxia: Secondary | ICD-10-CM | POA: Diagnosis not present

## 2017-10-03 DIAGNOSIS — R918 Other nonspecific abnormal finding of lung field: Secondary | ICD-10-CM | POA: Diagnosis not present

## 2017-10-03 DIAGNOSIS — R1312 Dysphagia, oropharyngeal phase: Secondary | ICD-10-CM | POA: Diagnosis not present

## 2017-10-04 DIAGNOSIS — R918 Other nonspecific abnormal finding of lung field: Secondary | ICD-10-CM | POA: Diagnosis not present

## 2017-10-04 DIAGNOSIS — J9601 Acute respiratory failure with hypoxia: Secondary | ICD-10-CM | POA: Diagnosis not present

## 2017-10-04 DIAGNOSIS — R1312 Dysphagia, oropharyngeal phase: Secondary | ICD-10-CM | POA: Diagnosis not present

## 2017-10-04 DIAGNOSIS — K7290 Hepatic failure, unspecified without coma: Secondary | ICD-10-CM | POA: Diagnosis not present

## 2017-10-04 DIAGNOSIS — K7031 Alcoholic cirrhosis of liver with ascites: Secondary | ICD-10-CM | POA: Diagnosis not present

## 2017-10-04 DIAGNOSIS — N179 Acute kidney failure, unspecified: Secondary | ICD-10-CM | POA: Diagnosis not present

## 2017-10-04 DIAGNOSIS — K922 Gastrointestinal hemorrhage, unspecified: Secondary | ICD-10-CM | POA: Diagnosis not present

## 2017-10-04 DIAGNOSIS — F1021 Alcohol dependence, in remission: Secondary | ICD-10-CM | POA: Diagnosis not present

## 2017-10-04 DIAGNOSIS — R5381 Other malaise: Secondary | ICD-10-CM | POA: Diagnosis not present

## 2017-10-05 DIAGNOSIS — K7290 Hepatic failure, unspecified without coma: Secondary | ICD-10-CM | POA: Diagnosis not present

## 2017-10-05 DIAGNOSIS — K7031 Alcoholic cirrhosis of liver with ascites: Secondary | ICD-10-CM | POA: Diagnosis not present

## 2017-10-05 DIAGNOSIS — J90 Pleural effusion, not elsewhere classified: Secondary | ICD-10-CM | POA: Diagnosis not present

## 2017-10-05 DIAGNOSIS — Z119 Encounter for screening for infectious and parasitic diseases, unspecified: Secondary | ICD-10-CM | POA: Diagnosis not present

## 2017-10-05 DIAGNOSIS — N179 Acute kidney failure, unspecified: Secondary | ICD-10-CM | POA: Diagnosis not present

## 2017-10-05 DIAGNOSIS — J9601 Acute respiratory failure with hypoxia: Secondary | ICD-10-CM | POA: Diagnosis not present

## 2017-10-05 DIAGNOSIS — K922 Gastrointestinal hemorrhage, unspecified: Secondary | ICD-10-CM | POA: Diagnosis not present

## 2017-10-05 DIAGNOSIS — Z01818 Encounter for other preprocedural examination: Secondary | ICD-10-CM | POA: Diagnosis not present

## 2017-10-05 DIAGNOSIS — K7200 Acute and subacute hepatic failure without coma: Secondary | ICD-10-CM | POA: Diagnosis not present

## 2017-10-05 DIAGNOSIS — R1312 Dysphagia, oropharyngeal phase: Secondary | ICD-10-CM | POA: Diagnosis not present

## 2017-10-05 DIAGNOSIS — R918 Other nonspecific abnormal finding of lung field: Secondary | ICD-10-CM | POA: Diagnosis not present

## 2017-10-05 DIAGNOSIS — R5381 Other malaise: Secondary | ICD-10-CM | POA: Diagnosis not present

## 2017-10-06 DIAGNOSIS — N179 Acute kidney failure, unspecified: Secondary | ICD-10-CM | POA: Diagnosis not present

## 2017-10-06 DIAGNOSIS — K7290 Hepatic failure, unspecified without coma: Secondary | ICD-10-CM | POA: Diagnosis not present

## 2017-10-06 DIAGNOSIS — K7200 Acute and subacute hepatic failure without coma: Secondary | ICD-10-CM | POA: Diagnosis not present

## 2017-10-07 DIAGNOSIS — N179 Acute kidney failure, unspecified: Secondary | ICD-10-CM | POA: Diagnosis not present

## 2017-10-07 DIAGNOSIS — K7200 Acute and subacute hepatic failure without coma: Secondary | ICD-10-CM | POA: Diagnosis not present

## 2017-10-07 DIAGNOSIS — K7290 Hepatic failure, unspecified without coma: Secondary | ICD-10-CM | POA: Diagnosis not present

## 2017-10-08 DIAGNOSIS — Z23 Encounter for immunization: Secondary | ICD-10-CM | POA: Diagnosis not present

## 2017-10-08 DIAGNOSIS — K922 Gastrointestinal hemorrhage, unspecified: Secondary | ICD-10-CM | POA: Diagnosis not present

## 2017-10-08 DIAGNOSIS — Z7682 Awaiting organ transplant status: Secondary | ICD-10-CM | POA: Diagnosis not present

## 2017-10-08 DIAGNOSIS — R5381 Other malaise: Secondary | ICD-10-CM | POA: Diagnosis not present

## 2017-10-08 DIAGNOSIS — N179 Acute kidney failure, unspecified: Secondary | ICD-10-CM | POA: Diagnosis not present

## 2017-10-08 DIAGNOSIS — K7200 Acute and subacute hepatic failure without coma: Secondary | ICD-10-CM | POA: Diagnosis not present

## 2017-10-08 DIAGNOSIS — K7290 Hepatic failure, unspecified without coma: Secondary | ICD-10-CM | POA: Diagnosis not present

## 2017-10-08 DIAGNOSIS — T68XXXD Hypothermia, subsequent encounter: Secondary | ICD-10-CM | POA: Diagnosis not present

## 2017-10-09 DIAGNOSIS — K7290 Hepatic failure, unspecified without coma: Secondary | ICD-10-CM | POA: Diagnosis not present

## 2017-10-09 DIAGNOSIS — K922 Gastrointestinal hemorrhage, unspecified: Secondary | ICD-10-CM | POA: Diagnosis not present

## 2017-10-09 DIAGNOSIS — Z7682 Awaiting organ transplant status: Secondary | ICD-10-CM | POA: Diagnosis not present

## 2017-10-09 DIAGNOSIS — Z23 Encounter for immunization: Secondary | ICD-10-CM | POA: Diagnosis not present

## 2017-10-09 DIAGNOSIS — K7200 Acute and subacute hepatic failure without coma: Secondary | ICD-10-CM | POA: Diagnosis not present

## 2017-10-09 DIAGNOSIS — T68XXXD Hypothermia, subsequent encounter: Secondary | ICD-10-CM | POA: Diagnosis not present

## 2017-10-09 DIAGNOSIS — N179 Acute kidney failure, unspecified: Secondary | ICD-10-CM | POA: Diagnosis not present

## 2017-10-10 DIAGNOSIS — Z7682 Awaiting organ transplant status: Secondary | ICD-10-CM | POA: Diagnosis not present

## 2017-10-10 DIAGNOSIS — T68XXXD Hypothermia, subsequent encounter: Secondary | ICD-10-CM | POA: Diagnosis not present

## 2017-10-10 DIAGNOSIS — K7290 Hepatic failure, unspecified without coma: Secondary | ICD-10-CM | POA: Diagnosis not present

## 2017-10-10 DIAGNOSIS — K7200 Acute and subacute hepatic failure without coma: Secondary | ICD-10-CM | POA: Diagnosis not present

## 2017-10-10 DIAGNOSIS — N179 Acute kidney failure, unspecified: Secondary | ICD-10-CM | POA: Diagnosis not present

## 2017-10-10 DIAGNOSIS — K922 Gastrointestinal hemorrhage, unspecified: Secondary | ICD-10-CM | POA: Diagnosis not present

## 2017-10-10 DIAGNOSIS — Z23 Encounter for immunization: Secondary | ICD-10-CM | POA: Diagnosis not present

## 2017-10-10 NOTE — Progress Notes (Signed)
Spoke with Vaughan Basta  At Dr. Vena Rua office to see if pt. Still having EGD at [facility]. Per pharmacy note pt. Stated it was cancelled. She will check with Dr. Hilarie Fredrickson and cancel if needed.

## 2017-10-11 ENCOUNTER — Telehealth: Payer: Self-pay

## 2017-10-11 DIAGNOSIS — T68XXXD Hypothermia, subsequent encounter: Secondary | ICD-10-CM | POA: Diagnosis not present

## 2017-10-11 DIAGNOSIS — R5381 Other malaise: Secondary | ICD-10-CM | POA: Diagnosis not present

## 2017-10-11 DIAGNOSIS — Z23 Encounter for immunization: Secondary | ICD-10-CM | POA: Diagnosis not present

## 2017-10-11 DIAGNOSIS — Z7682 Awaiting organ transplant status: Secondary | ICD-10-CM | POA: Diagnosis not present

## 2017-10-11 DIAGNOSIS — K7290 Hepatic failure, unspecified without coma: Secondary | ICD-10-CM | POA: Diagnosis not present

## 2017-10-11 DIAGNOSIS — R109 Unspecified abdominal pain: Secondary | ICD-10-CM | POA: Diagnosis not present

## 2017-10-11 DIAGNOSIS — K922 Gastrointestinal hemorrhage, unspecified: Secondary | ICD-10-CM | POA: Diagnosis not present

## 2017-10-11 DIAGNOSIS — K7200 Acute and subacute hepatic failure without coma: Secondary | ICD-10-CM | POA: Diagnosis not present

## 2017-10-11 DIAGNOSIS — N179 Acute kidney failure, unspecified: Secondary | ICD-10-CM | POA: Diagnosis not present

## 2017-10-11 NOTE — Telephone Encounter (Signed)
-----   Message from [name], MD sent at 10/11/2017  8:34 AM EDT ----- Regarding: RE: EGD Yes JMP  ----- Message ----- From: Algernon Huxley, RN Sent: 10/10/2017   1:39 PM To: [name], MD Subject: EGD                                            Do we need to cancel this EGD appt?

## 2017-10-11 NOTE — Telephone Encounter (Signed)
Appt cancelled

## 2017-10-12 DIAGNOSIS — Z87891 Personal history of nicotine dependence: Secondary | ICD-10-CM | POA: Diagnosis not present

## 2017-10-12 DIAGNOSIS — T8189XA Other complications of procedures, not elsewhere classified, initial encounter: Secondary | ICD-10-CM | POA: Diagnosis not present

## 2017-10-12 DIAGNOSIS — D649 Anemia, unspecified: Secondary | ICD-10-CM | POA: Diagnosis not present

## 2017-10-12 DIAGNOSIS — Z789 Other specified health status: Secondary | ICD-10-CM | POA: Diagnosis not present

## 2017-10-12 DIAGNOSIS — W01198A Fall on same level from slipping, tripping and stumbling with subsequent striking against other object, initial encounter: Secondary | ICD-10-CM | POA: Diagnosis not present

## 2017-10-12 DIAGNOSIS — Z4823 Encounter for aftercare following liver transplant: Secondary | ICD-10-CM | POA: Diagnosis not present

## 2017-10-12 DIAGNOSIS — N178 Other acute kidney failure: Secondary | ICD-10-CM | POA: Diagnosis not present

## 2017-10-12 DIAGNOSIS — N2581 Secondary hyperparathyroidism of renal origin: Secondary | ICD-10-CM | POA: Diagnosis not present

## 2017-10-12 DIAGNOSIS — K7200 Acute and subacute hepatic failure without coma: Secondary | ICD-10-CM | POA: Diagnosis not present

## 2017-10-12 DIAGNOSIS — S0990XA Unspecified injury of head, initial encounter: Secondary | ICD-10-CM | POA: Diagnosis not present

## 2017-10-12 DIAGNOSIS — Z01810 Encounter for preprocedural cardiovascular examination: Secondary | ICD-10-CM | POA: Diagnosis not present

## 2017-10-12 DIAGNOSIS — N189 Chronic kidney disease, unspecified: Secondary | ICD-10-CM | POA: Diagnosis not present

## 2017-10-12 DIAGNOSIS — S0003XA Contusion of scalp, initial encounter: Secondary | ICD-10-CM | POA: Diagnosis not present

## 2017-10-12 DIAGNOSIS — J811 Chronic pulmonary edema: Secondary | ICD-10-CM | POA: Diagnosis not present

## 2017-10-12 DIAGNOSIS — S50312A Abrasion of left elbow, initial encounter: Secondary | ICD-10-CM | POA: Diagnosis not present

## 2017-10-12 DIAGNOSIS — J90 Pleural effusion, not elsewhere classified: Secondary | ICD-10-CM | POA: Diagnosis not present

## 2017-10-12 DIAGNOSIS — K3189 Other diseases of stomach and duodenum: Secondary | ICD-10-CM | POA: Diagnosis not present

## 2017-10-12 DIAGNOSIS — F1021 Alcohol dependence, in remission: Secondary | ICD-10-CM | POA: Diagnosis not present

## 2017-10-12 DIAGNOSIS — Z298 Encounter for other specified prophylactic measures: Secondary | ICD-10-CM | POA: Diagnosis not present

## 2017-10-12 DIAGNOSIS — Z9181 History of falling: Secondary | ICD-10-CM | POA: Diagnosis not present

## 2017-10-12 DIAGNOSIS — Z7982 Long term (current) use of aspirin: Secondary | ICD-10-CM | POA: Diagnosis not present

## 2017-10-12 DIAGNOSIS — R1312 Dysphagia, oropharyngeal phase: Secondary | ICD-10-CM | POA: Diagnosis not present

## 2017-10-12 DIAGNOSIS — D899 Disorder involving the immune mechanism, unspecified: Secondary | ICD-10-CM | POA: Diagnosis not present

## 2017-10-12 DIAGNOSIS — R51 Headache: Secondary | ICD-10-CM | POA: Diagnosis not present

## 2017-10-12 DIAGNOSIS — Z23 Encounter for immunization: Secondary | ICD-10-CM | POA: Diagnosis not present

## 2017-10-12 DIAGNOSIS — K7290 Hepatic failure, unspecified without coma: Secondary | ICD-10-CM | POA: Diagnosis not present

## 2017-10-12 DIAGNOSIS — Z792 Long term (current) use of antibiotics: Secondary | ICD-10-CM | POA: Diagnosis not present

## 2017-10-12 DIAGNOSIS — Z944 Liver transplant status: Secondary | ICD-10-CM | POA: Diagnosis not present

## 2017-10-12 DIAGNOSIS — E440 Moderate protein-calorie malnutrition: Secondary | ICD-10-CM | POA: Diagnosis not present

## 2017-10-12 DIAGNOSIS — R410 Disorientation, unspecified: Secondary | ICD-10-CM | POA: Diagnosis not present

## 2017-10-12 DIAGNOSIS — K922 Gastrointestinal hemorrhage, unspecified: Secondary | ICD-10-CM | POA: Diagnosis not present

## 2017-10-12 DIAGNOSIS — Z7682 Awaiting organ transplant status: Secondary | ICD-10-CM | POA: Diagnosis not present

## 2017-10-12 DIAGNOSIS — K7031 Alcoholic cirrhosis of liver with ascites: Secondary | ICD-10-CM | POA: Diagnosis not present

## 2017-10-12 DIAGNOSIS — R739 Hyperglycemia, unspecified: Secondary | ICD-10-CM | POA: Diagnosis not present

## 2017-10-12 DIAGNOSIS — W19XXXA Unspecified fall, initial encounter: Secondary | ICD-10-CM | POA: Diagnosis not present

## 2017-10-12 DIAGNOSIS — T68XXXD Hypothermia, subsequent encounter: Secondary | ICD-10-CM | POA: Diagnosis not present

## 2017-10-12 DIAGNOSIS — Z4682 Encounter for fitting and adjustment of non-vascular catheter: Secondary | ICD-10-CM | POA: Diagnosis not present

## 2017-10-12 DIAGNOSIS — E43 Unspecified severe protein-calorie malnutrition: Secondary | ICD-10-CM | POA: Diagnosis not present

## 2017-10-12 DIAGNOSIS — Z7952 Long term (current) use of systemic steroids: Secondary | ICD-10-CM | POA: Diagnosis not present

## 2017-10-12 DIAGNOSIS — J9811 Atelectasis: Secondary | ICD-10-CM | POA: Diagnosis not present

## 2017-10-12 DIAGNOSIS — R5381 Other malaise: Secondary | ICD-10-CM | POA: Diagnosis not present

## 2017-10-12 DIAGNOSIS — T380X5A Adverse effect of glucocorticoids and synthetic analogues, initial encounter: Secondary | ICD-10-CM | POA: Diagnosis not present

## 2017-10-12 DIAGNOSIS — Z8701 Personal history of pneumonia (recurrent): Secondary | ICD-10-CM | POA: Diagnosis not present

## 2017-10-12 DIAGNOSIS — N179 Acute kidney failure, unspecified: Secondary | ICD-10-CM | POA: Diagnosis not present

## 2017-10-15 MED ORDER — GENERIC EXTERNAL MEDICATION
100.00 | Status: DC
Start: 2017-10-15 — End: 2017-10-15

## 2017-10-15 MED ORDER — GENERIC EXTERNAL MEDICATION
3.38 | Status: DC
Start: 2017-10-15 — End: 2017-10-15

## 2017-10-15 MED ORDER — TACROLIMUS 0.5 MG PO CAPS
.50 | ORAL_CAPSULE | ORAL | Status: DC
Start: 2017-10-15 — End: 2017-10-15

## 2017-10-15 MED ORDER — GENERIC EXTERNAL MEDICATION
Status: DC
Start: 2017-10-15 — End: 2017-10-15

## 2017-10-15 MED ORDER — GENERIC EXTERNAL MEDICATION
40.00 | Status: DC
Start: 2017-10-16 — End: 2017-10-15

## 2017-10-15 MED ORDER — MELATONIN 3 MG PO TABS
6.00 | ORAL_TABLET | ORAL | Status: DC
Start: 2017-10-15 — End: 2017-10-15

## 2017-10-15 MED ORDER — TRAZODONE HCL 50 MG PO TABS
50.00 | ORAL_TABLET | ORAL | Status: DC
Start: ? — End: 2017-10-15

## 2017-10-15 MED ORDER — MYCOPHENOLATE MOFETIL HCL IV
1000.00 | INTRAVENOUS | Status: DC
Start: 2017-10-15 — End: 2017-10-15

## 2017-10-15 MED ORDER — SULFAMETHOXAZOLE-TRIMETHOPRIM 800-160 MG PO TABS
1.00 | ORAL_TABLET | ORAL | Status: DC
Start: 2017-10-17 — End: 2017-10-15

## 2017-10-15 MED ORDER — SENNOSIDES-DOCUSATE SODIUM 8.6-50 MG PO TABS
2.00 | ORAL_TABLET | ORAL | Status: DC
Start: 2017-10-15 — End: 2017-10-15

## 2017-10-15 MED ORDER — HEPATITIS B VAC RECOMBINANT 20 MCG/ML IJ SUSP
40.00 | INTRAMUSCULAR | Status: DC
Start: 2017-10-30 — End: 2017-10-15

## 2017-10-15 MED ORDER — OXIDIZED CELLULOSE EX
1.00 | CUTANEOUS | Status: DC
Start: ? — End: 2017-10-15

## 2017-10-15 MED ORDER — FENTANYL CITRATE (PF) 2500 MCG/50ML IJ SOLN
12.50 | INTRAMUSCULAR | Status: DC
Start: ? — End: 2017-10-15

## 2017-10-15 MED ORDER — GENERIC EXTERNAL MEDICATION
Status: DC
Start: ? — End: 2017-10-15

## 2017-10-15 MED ORDER — GLUCAGON HCL RDNA (DIAGNOSTIC) 1 MG IJ SOLR
1.00 | INTRAMUSCULAR | Status: DC
Start: ? — End: 2017-10-15

## 2017-10-15 MED ORDER — ASPIRIN 81 MG PO CHEW
81.00 | CHEWABLE_TABLET | ORAL | Status: DC
Start: 2017-10-16 — End: 2017-10-15

## 2017-10-15 MED ORDER — INSULIN REGULAR HUMAN 100 UNIT/ML IJ SOLN
.00 | INTRAMUSCULAR | Status: DC
Start: 2017-10-15 — End: 2017-10-15

## 2017-10-15 MED ORDER — HEPATITIS B VAC RECOMBINANT 20 MCG/ML IJ SUSP
40.00 | INTRAMUSCULAR | Status: DC
Start: 2017-10-16 — End: 2017-10-15

## 2017-10-15 MED ORDER — DEXTROSE 50 % IV SOLN
12.50 | INTRAVENOUS | Status: DC
Start: ? — End: 2017-10-15

## 2017-10-16 ENCOUNTER — Telehealth: Payer: Self-pay | Admitting: Internal Medicine

## 2017-10-16 ENCOUNTER — Ambulatory Visit (HOSPITAL_COMMUNITY): Admit: 2017-10-16 | Payer: BLUE CROSS/BLUE SHIELD | Admitting: Internal Medicine

## 2017-10-16 ENCOUNTER — Encounter (HOSPITAL_COMMUNITY): Payer: Self-pay

## 2017-10-16 SURGERY — ESOPHAGOGASTRODUODENOSCOPY (EGD) WITH PROPOFOL
Anesthesia: Monitor Anesthesia Care

## 2017-10-16 NOTE — Telephone Encounter (Signed)
[facility] called and stated that pt is doing well, he was up in the chair yesterday.

## 2017-10-16 NOTE — Telephone Encounter (Signed)
Great news! 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

## 2017-10-29 DIAGNOSIS — W01198A Fall on same level from slipping, tripping and stumbling with subsequent striking against other object, initial encounter: Secondary | ICD-10-CM | POA: Diagnosis not present

## 2017-10-29 DIAGNOSIS — W19XXXA Unspecified fall, initial encounter: Secondary | ICD-10-CM | POA: Diagnosis not present

## 2017-10-29 DIAGNOSIS — S50312A Abrasion of left elbow, initial encounter: Secondary | ICD-10-CM | POA: Diagnosis not present

## 2017-10-29 DIAGNOSIS — S0003XA Contusion of scalp, initial encounter: Secondary | ICD-10-CM | POA: Diagnosis not present

## 2017-10-29 DIAGNOSIS — R51 Headache: Secondary | ICD-10-CM | POA: Diagnosis not present

## 2017-10-29 DIAGNOSIS — S0990XA Unspecified injury of head, initial encounter: Secondary | ICD-10-CM | POA: Diagnosis not present

## 2017-10-30 DIAGNOSIS — N179 Acute kidney failure, unspecified: Secondary | ICD-10-CM | POA: Diagnosis not present

## 2017-11-02 DIAGNOSIS — D899 Disorder involving the immune mechanism, unspecified: Secondary | ICD-10-CM | POA: Diagnosis not present

## 2017-11-02 DIAGNOSIS — Z944 Liver transplant status: Secondary | ICD-10-CM | POA: Diagnosis not present

## 2017-11-05 DIAGNOSIS — D899 Disorder involving the immune mechanism, unspecified: Secondary | ICD-10-CM | POA: Diagnosis not present

## 2017-11-05 DIAGNOSIS — Z792 Long term (current) use of antibiotics: Secondary | ICD-10-CM | POA: Diagnosis not present

## 2017-11-05 DIAGNOSIS — N179 Acute kidney failure, unspecified: Secondary | ICD-10-CM | POA: Diagnosis not present

## 2017-11-05 DIAGNOSIS — E440 Moderate protein-calorie malnutrition: Secondary | ICD-10-CM | POA: Diagnosis not present

## 2017-11-05 DIAGNOSIS — R410 Disorientation, unspecified: Secondary | ICD-10-CM | POA: Diagnosis not present

## 2017-11-05 DIAGNOSIS — Z944 Liver transplant status: Secondary | ICD-10-CM | POA: Diagnosis not present

## 2017-11-06 DIAGNOSIS — N179 Acute kidney failure, unspecified: Secondary | ICD-10-CM | POA: Diagnosis not present

## 2017-11-09 DIAGNOSIS — N179 Acute kidney failure, unspecified: Secondary | ICD-10-CM | POA: Diagnosis not present

## 2017-11-09 DIAGNOSIS — Z944 Liver transplant status: Secondary | ICD-10-CM | POA: Diagnosis not present

## 2017-11-09 DIAGNOSIS — Z792 Long term (current) use of antibiotics: Secondary | ICD-10-CM | POA: Diagnosis not present

## 2017-11-09 DIAGNOSIS — D899 Disorder involving the immune mechanism, unspecified: Secondary | ICD-10-CM | POA: Diagnosis not present

## 2017-11-13 DIAGNOSIS — N179 Acute kidney failure, unspecified: Secondary | ICD-10-CM | POA: Diagnosis not present

## 2017-11-13 DIAGNOSIS — Z944 Liver transplant status: Secondary | ICD-10-CM | POA: Diagnosis not present

## 2017-11-13 DIAGNOSIS — D899 Disorder involving the immune mechanism, unspecified: Secondary | ICD-10-CM | POA: Diagnosis not present

## 2017-11-16 DIAGNOSIS — Z944 Liver transplant status: Secondary | ICD-10-CM | POA: Diagnosis not present

## 2017-11-16 DIAGNOSIS — D899 Disorder involving the immune mechanism, unspecified: Secondary | ICD-10-CM | POA: Diagnosis not present

## 2017-11-21 DIAGNOSIS — Z8701 Personal history of pneumonia (recurrent): Secondary | ICD-10-CM | POA: Diagnosis not present

## 2017-11-21 DIAGNOSIS — Z9181 History of falling: Secondary | ICD-10-CM | POA: Diagnosis not present

## 2017-11-21 DIAGNOSIS — Z7982 Long term (current) use of aspirin: Secondary | ICD-10-CM | POA: Diagnosis not present

## 2017-11-21 DIAGNOSIS — Z944 Liver transplant status: Secondary | ICD-10-CM | POA: Diagnosis not present

## 2017-11-21 DIAGNOSIS — Z7952 Long term (current) use of systemic steroids: Secondary | ICD-10-CM | POA: Diagnosis not present

## 2017-11-21 DIAGNOSIS — D649 Anemia, unspecified: Secondary | ICD-10-CM | POA: Diagnosis not present

## 2017-11-21 DIAGNOSIS — Z87891 Personal history of nicotine dependence: Secondary | ICD-10-CM | POA: Diagnosis not present

## 2017-11-21 DIAGNOSIS — K3189 Other diseases of stomach and duodenum: Secondary | ICD-10-CM | POA: Diagnosis not present

## 2017-11-21 DIAGNOSIS — Z4823 Encounter for aftercare following liver transplant: Secondary | ICD-10-CM | POA: Diagnosis not present

## 2017-11-22 DIAGNOSIS — Z944 Liver transplant status: Secondary | ICD-10-CM | POA: Diagnosis not present

## 2017-11-22 DIAGNOSIS — D899 Disorder involving the immune mechanism, unspecified: Secondary | ICD-10-CM | POA: Diagnosis not present

## 2017-11-23 DIAGNOSIS — Z87891 Personal history of nicotine dependence: Secondary | ICD-10-CM | POA: Diagnosis not present

## 2017-11-23 DIAGNOSIS — D649 Anemia, unspecified: Secondary | ICD-10-CM | POA: Diagnosis not present

## 2017-11-23 DIAGNOSIS — Z7982 Long term (current) use of aspirin: Secondary | ICD-10-CM | POA: Diagnosis not present

## 2017-11-23 DIAGNOSIS — Z9181 History of falling: Secondary | ICD-10-CM | POA: Diagnosis not present

## 2017-11-23 DIAGNOSIS — Z4823 Encounter for aftercare following liver transplant: Secondary | ICD-10-CM | POA: Diagnosis not present

## 2017-11-23 DIAGNOSIS — K3189 Other diseases of stomach and duodenum: Secondary | ICD-10-CM | POA: Diagnosis not present

## 2017-11-23 DIAGNOSIS — Z7952 Long term (current) use of systemic steroids: Secondary | ICD-10-CM | POA: Diagnosis not present

## 2017-11-23 DIAGNOSIS — Z8701 Personal history of pneumonia (recurrent): Secondary | ICD-10-CM | POA: Diagnosis not present

## 2017-11-23 DIAGNOSIS — Z944 Liver transplant status: Secondary | ICD-10-CM | POA: Diagnosis not present

## 2017-11-24 DIAGNOSIS — R000 Tachycardia, unspecified: Secondary | ICD-10-CM | POA: Diagnosis not present

## 2017-11-24 DIAGNOSIS — J90 Pleural effusion, not elsewhere classified: Secondary | ICD-10-CM | POA: Diagnosis not present

## 2017-11-24 DIAGNOSIS — I4581 Long QT syndrome: Secondary | ICD-10-CM | POA: Diagnosis not present

## 2017-11-24 DIAGNOSIS — Z5181 Encounter for therapeutic drug level monitoring: Secondary | ICD-10-CM | POA: Diagnosis not present

## 2017-11-24 DIAGNOSIS — J9811 Atelectasis: Secondary | ICD-10-CM | POA: Diagnosis not present

## 2017-11-24 DIAGNOSIS — Z944 Liver transplant status: Secondary | ICD-10-CM | POA: Diagnosis not present

## 2017-11-24 DIAGNOSIS — R7989 Other specified abnormal findings of blood chemistry: Secondary | ICD-10-CM | POA: Diagnosis not present

## 2017-11-24 DIAGNOSIS — R0601 Orthopnea: Secondary | ICD-10-CM | POA: Diagnosis not present

## 2017-11-24 DIAGNOSIS — E8770 Fluid overload, unspecified: Secondary | ICD-10-CM | POA: Diagnosis not present

## 2017-11-24 DIAGNOSIS — R0602 Shortness of breath: Secondary | ICD-10-CM | POA: Diagnosis not present

## 2017-11-24 DIAGNOSIS — R030 Elevated blood-pressure reading, without diagnosis of hypertension: Secondary | ICD-10-CM | POA: Diagnosis not present

## 2017-11-26 DIAGNOSIS — I10 Essential (primary) hypertension: Secondary | ICD-10-CM | POA: Diagnosis not present

## 2017-11-26 DIAGNOSIS — J90 Pleural effusion, not elsewhere classified: Secondary | ICD-10-CM | POA: Diagnosis not present

## 2017-11-26 DIAGNOSIS — J811 Chronic pulmonary edema: Secondary | ICD-10-CM | POA: Diagnosis not present

## 2017-11-26 DIAGNOSIS — Z944 Liver transplant status: Secondary | ICD-10-CM | POA: Diagnosis not present

## 2017-11-26 DIAGNOSIS — Z792 Long term (current) use of antibiotics: Secondary | ICD-10-CM | POA: Diagnosis not present

## 2017-11-26 DIAGNOSIS — Z48298 Encounter for aftercare following other organ transplant: Secondary | ICD-10-CM | POA: Diagnosis not present

## 2017-11-26 DIAGNOSIS — K767 Hepatorenal syndrome: Secondary | ICD-10-CM | POA: Diagnosis not present

## 2017-11-26 DIAGNOSIS — Z008 Encounter for other general examination: Secondary | ICD-10-CM | POA: Diagnosis not present

## 2017-11-26 DIAGNOSIS — N182 Chronic kidney disease, stage 2 (mild): Secondary | ICD-10-CM | POA: Diagnosis not present

## 2017-11-27 DIAGNOSIS — D899 Disorder involving the immune mechanism, unspecified: Secondary | ICD-10-CM | POA: Diagnosis not present

## 2017-11-27 DIAGNOSIS — N179 Acute kidney failure, unspecified: Secondary | ICD-10-CM | POA: Diagnosis not present

## 2017-11-27 DIAGNOSIS — J90 Pleural effusion, not elsewhere classified: Secondary | ICD-10-CM | POA: Diagnosis not present

## 2017-11-27 DIAGNOSIS — Z4823 Encounter for aftercare following liver transplant: Secondary | ICD-10-CM | POA: Diagnosis not present

## 2017-11-27 DIAGNOSIS — M545 Low back pain: Secondary | ICD-10-CM | POA: Diagnosis not present

## 2017-11-27 DIAGNOSIS — Z452 Encounter for adjustment and management of vascular access device: Secondary | ICD-10-CM | POA: Diagnosis not present

## 2017-11-27 DIAGNOSIS — N189 Chronic kidney disease, unspecified: Secondary | ICD-10-CM | POA: Diagnosis not present

## 2017-11-27 DIAGNOSIS — M25552 Pain in left hip: Secondary | ICD-10-CM | POA: Diagnosis not present

## 2017-11-27 DIAGNOSIS — Z23 Encounter for immunization: Secondary | ICD-10-CM | POA: Diagnosis not present

## 2017-11-27 DIAGNOSIS — Z944 Liver transplant status: Secondary | ICD-10-CM | POA: Diagnosis not present

## 2017-11-27 DIAGNOSIS — M25559 Pain in unspecified hip: Secondary | ICD-10-CM | POA: Diagnosis not present

## 2017-11-27 DIAGNOSIS — M25551 Pain in right hip: Secondary | ICD-10-CM | POA: Diagnosis not present

## 2017-11-28 DIAGNOSIS — Z87891 Personal history of nicotine dependence: Secondary | ICD-10-CM | POA: Diagnosis not present

## 2017-11-28 DIAGNOSIS — Z4823 Encounter for aftercare following liver transplant: Secondary | ICD-10-CM | POA: Diagnosis not present

## 2017-11-28 DIAGNOSIS — Z944 Liver transplant status: Secondary | ICD-10-CM | POA: Diagnosis not present

## 2017-11-28 DIAGNOSIS — Z7982 Long term (current) use of aspirin: Secondary | ICD-10-CM | POA: Diagnosis not present

## 2017-11-28 DIAGNOSIS — K3189 Other diseases of stomach and duodenum: Secondary | ICD-10-CM | POA: Diagnosis not present

## 2017-11-28 DIAGNOSIS — Z9181 History of falling: Secondary | ICD-10-CM | POA: Diagnosis not present

## 2017-11-28 DIAGNOSIS — D649 Anemia, unspecified: Secondary | ICD-10-CM | POA: Diagnosis not present

## 2017-11-28 DIAGNOSIS — Z8701 Personal history of pneumonia (recurrent): Secondary | ICD-10-CM | POA: Diagnosis not present

## 2017-11-28 DIAGNOSIS — Z7952 Long term (current) use of systemic steroids: Secondary | ICD-10-CM | POA: Diagnosis not present

## 2017-11-30 DIAGNOSIS — Z9181 History of falling: Secondary | ICD-10-CM | POA: Diagnosis not present

## 2017-11-30 DIAGNOSIS — Z4823 Encounter for aftercare following liver transplant: Secondary | ICD-10-CM | POA: Diagnosis not present

## 2017-11-30 DIAGNOSIS — D649 Anemia, unspecified: Secondary | ICD-10-CM | POA: Diagnosis not present

## 2017-11-30 DIAGNOSIS — Z7952 Long term (current) use of systemic steroids: Secondary | ICD-10-CM | POA: Diagnosis not present

## 2017-11-30 DIAGNOSIS — Z944 Liver transplant status: Secondary | ICD-10-CM | POA: Diagnosis not present

## 2017-11-30 DIAGNOSIS — K3189 Other diseases of stomach and duodenum: Secondary | ICD-10-CM | POA: Diagnosis not present

## 2017-11-30 DIAGNOSIS — Z87891 Personal history of nicotine dependence: Secondary | ICD-10-CM | POA: Diagnosis not present

## 2017-11-30 DIAGNOSIS — Z8701 Personal history of pneumonia (recurrent): Secondary | ICD-10-CM | POA: Diagnosis not present

## 2017-11-30 DIAGNOSIS — Z7982 Long term (current) use of aspirin: Secondary | ICD-10-CM | POA: Diagnosis not present

## 2017-12-06 DIAGNOSIS — Z87891 Personal history of nicotine dependence: Secondary | ICD-10-CM | POA: Diagnosis not present

## 2017-12-06 DIAGNOSIS — Z7952 Long term (current) use of systemic steroids: Secondary | ICD-10-CM | POA: Diagnosis not present

## 2017-12-06 DIAGNOSIS — Z7982 Long term (current) use of aspirin: Secondary | ICD-10-CM | POA: Diagnosis not present

## 2017-12-06 DIAGNOSIS — Z4823 Encounter for aftercare following liver transplant: Secondary | ICD-10-CM | POA: Diagnosis not present

## 2017-12-06 DIAGNOSIS — Z944 Liver transplant status: Secondary | ICD-10-CM | POA: Diagnosis not present

## 2017-12-06 DIAGNOSIS — Z8701 Personal history of pneumonia (recurrent): Secondary | ICD-10-CM | POA: Diagnosis not present

## 2017-12-06 DIAGNOSIS — Z9181 History of falling: Secondary | ICD-10-CM | POA: Diagnosis not present

## 2017-12-06 DIAGNOSIS — D649 Anemia, unspecified: Secondary | ICD-10-CM | POA: Diagnosis not present

## 2017-12-06 DIAGNOSIS — K3189 Other diseases of stomach and duodenum: Secondary | ICD-10-CM | POA: Diagnosis not present

## 2017-12-06 DIAGNOSIS — Z1159 Encounter for screening for other viral diseases: Secondary | ICD-10-CM | POA: Diagnosis not present

## 2017-12-17 DIAGNOSIS — Z944 Liver transplant status: Secondary | ICD-10-CM | POA: Diagnosis not present

## 2018-01-01 DIAGNOSIS — Z944 Liver transplant status: Secondary | ICD-10-CM | POA: Diagnosis not present

## 2018-01-15 DIAGNOSIS — G621 Alcoholic polyneuropathy: Secondary | ICD-10-CM | POA: Diagnosis not present

## 2018-01-15 DIAGNOSIS — N182 Chronic kidney disease, stage 2 (mild): Secondary | ICD-10-CM | POA: Diagnosis not present

## 2018-01-15 DIAGNOSIS — Z944 Liver transplant status: Secondary | ICD-10-CM | POA: Diagnosis not present

## 2018-01-15 DIAGNOSIS — E099 Drug or chemical induced diabetes mellitus without complications: Secondary | ICD-10-CM | POA: Diagnosis not present

## 2018-01-15 DIAGNOSIS — N179 Acute kidney failure, unspecified: Secondary | ICD-10-CM | POA: Diagnosis not present

## 2018-01-15 DIAGNOSIS — T380X5A Adverse effect of glucocorticoids and synthetic analogues, initial encounter: Secondary | ICD-10-CM | POA: Diagnosis not present

## 2018-01-15 DIAGNOSIS — D899 Disorder involving the immune mechanism, unspecified: Secondary | ICD-10-CM | POA: Diagnosis not present

## 2018-01-29 ENCOUNTER — Ambulatory Visit (HOSPITAL_COMMUNITY)
Admission: EM | Admit: 2018-01-29 | Discharge: 2018-01-29 | Disposition: A | Discharge status: Home / Self Care | Payer: BLUE CROSS/BLUE SHIELD | Attending: Internal Medicine | Admitting: Internal Medicine

## 2018-01-29 ENCOUNTER — Encounter (HOSPITAL_COMMUNITY): Payer: Self-pay | Admitting: Emergency Medicine

## 2018-01-29 DIAGNOSIS — R69 Illness, unspecified: Secondary | ICD-10-CM

## 2018-01-29 DIAGNOSIS — J111 Influenza due to unidentified influenza virus with other respiratory manifestations: Secondary | ICD-10-CM

## 2018-01-29 MED ORDER — OSELTAMIVIR PHOSPHATE 75 MG PO CAPS: 75.0000 mg | ORAL_CAPSULE | Freq: Two times a day (BID) | ORAL | 0 refills | Status: DC

## 2018-01-29 NOTE — ED Provider Notes (Signed)
[place]    CSN: [id number] Arrival date & time: 01/29/18  1202     History   Chief Complaint Chief Complaint  Patient presents with  . URI    HPI Thomas [name] is a 57 y.o. male.   He had a liver transplant in November 2018.  Presents today with onset in the last 24 hours or so of cough, fever and chills, chest tightness.  No stomach upset, no diarrhea.  Has some achiness, not clear that this is new.  Little bit of head congestion, may be a little bit of scratchy throat.    HPI  Past Medical History:  Diagnosis Date  . Alcohol abuse 2013  . Alcoholic cirrhosis ([place]) 07/1828  . Anemia 02/2016   in setting of GI blood loss, but MCV macrocytic.   . Coagulopathy ([place]) 05/2015  . Diverticulosis 09/2015  . Esophageal varices ([place]) 09/2015   small  . GERD (gastroesophageal reflux disease)   . Hyperlipidemia   . Hypertension   . Pneumonia   . Portal hypertension ([place]) 09/2015   with portal gastropathy  . Thrombocytopenia ([place]) 02/2016  . Tubular adenoma of colon 09/2015    Patient Active Problem List   Diagnosis Date Noted  . GI bleed 09/14/2017  . Acute hepatic encephalopathy 09/04/2017  . Acute upper GI bleed 06/13/2017  . HCAP (healthcare-associated pneumonia) 04/27/2017  . Acute renal failure (ARF) ([place]) 03/28/2017  . Hyperglycemia 03/28/2017  . Hypoalbuminemia 03/28/2017  . Edema 03/28/2017  . S/P thoracentesis   . Symptomatic anemia   . Dyspnea 03/03/2017  . Hypomagnesemia 11/26/2016  . Acute respiratory failure with hypoxia ([place]) 11/24/2016  . Pleural effusion associated with hepatic disorder 11/24/2016  . Hypokalemia 11/24/2016  . SBP (spontaneous bacterial peritonitis) ([place]) 05/09/2016  . Severe sepsis ([place]) 05/09/2016  . Decompensation of cirrhosis of liver ([place]) 05/07/2016  . Hypotension 05/07/2016  . Ascites due to alcoholic cirrhosis ([place]) 93/71/6967  . Localized edema 04/17/2016  . Alcoholic cirrhosis of liver with ascites ([place])   .  End stage liver disease ([place])   . Upper GI bleed 03/05/2016  . Coagulopathy ([place]) 03/05/2016  . Acute kidney injury ([place]) 03/05/2016  . Hepatic encephalopathy ([place]) 03/05/2016  . Macrocytic anemia 03/05/2016  . Thrombocytopenia ([place]) 03/05/2016  . Esophageal varices ([place]) 03/05/2016  . Iron deficiency anemia due to chronic blood loss 03/05/2016  . Idiopathic esophageal varices without bleeding ([place])   . Portal hypertensive gastropathy [facility])     Past Surgical History:  Procedure Laterality Date  . ESOPHAGOGASTRODUODENOSCOPY N/A 03/05/2016   Procedure: ESOPHAGOGASTRODUODENOSCOPY (EGD);  Surgeon: Ladene Artist, MD;  Location: [name];  Service: Endoscopy;  Laterality: N/A;  . ESOPHAGOGASTRODUODENOSCOPY N/A 04/29/2017   Procedure: ESOPHAGOGASTRODUODENOSCOPY (EGD);  Surgeon: Milus Banister, MD;  Location: [name];  Service: Endoscopy;  Laterality: N/A;  . ESOPHAGOGASTRODUODENOSCOPY (EGD) WITH PROPOFOL N/A 03/29/2017   Procedure: ESOPHAGOGASTRODUODENOSCOPY (EGD) WITH PROPOFOL;  Surgeon: Doran Stabler, MD;  Location: [facility] ENDOSCOPY;  Service: Endoscopy;  Laterality: N/A;  . IR PARACENTESIS  05/15/2017  . IR PARACENTESIS  06/11/2017  . IR PARACENTESIS  06/26/2017  . IR PARACENTESIS  07/10/2017  . IR PARACENTESIS  08/16/2017  . IR PARACENTESIS  09/14/2017  . IR THORACENTESIS ASP PLEURAL SPACE W/IMG GUIDE  06/28/2017  . LIVER TRANSPLANT    . testicle prosthesis         Home Medications    Prior to Admission medications   Medication Sig Start Date End Date  Taking? Authorizing Provider  aspirin 81 MG chewable tablet Chew 81 mg by mouth daily.   Yes [provider]  bismuth subsalicylate (PEPTO BISMOL) 262 MG chewable tablet Chew 262-524 mg by mouth 3 (three) times daily as needed for indigestion.   Yes [provider]  calcium carbonate (TUMS EX) 750 MG chewable tablet Chew 1-2 tablets by mouth 3 (three) times daily as needed for heartburn.   Yes [provider]  gabapentin (NEURONTIN) 300 MG capsule Take 300 mg by mouth at bedtime.   Yes [provider]  magnesium oxide (MAG-OX) 400 (241.3 Mg) MG tablet Take 1/2 tablet twice daily. 09/20/17  Yes Esterwood, Amy S, PA-C  pantoprazole (PROTONIX) 40 MG tablet TAKE 1 TABLET (40 MG TOTAL) BY MOUTH 2 (TWO) TIMES DAILY. 07/06/17  Yes Pyrtle, Lajuan Lines, MD  UNKNOWN TO PATIENT    Yes [provider]  albumin human 25 % bottle Inject 300 mLs (75 g total) into the vein daily. 09/24/17   Donne Hazel, MD  Ampicillin-Sulbactam 3 g in sodium chloride 0.9 % 100 mL Inject 3 g into the vein every 12 (twelve) hours. 09/24/17   Donne Hazel, MD  benzonatate (TESSALON) 100 MG capsule Take 1 capsule (100 mg total) by mouth 2 (two) times daily. Patient taking differently: Take 100 mg by mouth 2 (two) times daily as needed for cough.  09/07/17   Robbie Lis, MD  calcium-vitamin D (OSCAL WITH D) 500-200 MG-UNIT tablet Take 1 tablet by mouth 2 (two) times daily.    [provider]  Dextromethorphan HBr (TUSSIN COUGH PO) Take 2 capsules by mouth at bedtime.     [provider]  ergocalciferol (VITAMIN D2) 50000 units capsule Take 50,000 Units by mouth once a week.    [provider]  ferrous sulfate 325 (65 FE) MG tablet Take 1 tablet (325 mg total) by mouth 3 (three) times daily with meals. Patient taking differently: Take 325 mg by mouth every evening.  04/30/17   Patrecia Pour, Christean Grief, MD  folic acid (FOLVITE) 1 MG tablet TAKE 1 TABLET BY MOUTH AT BEDTIME 08/20/17   Pyrtle, Lajuan Lines, MD  lactulose (CEPHULAC) 20 g packet Take 1 packet (20 g total) by mouth 3 (three) times daily. Patient taking differently: Take 20 g by mouth 2 (two) times daily.  08/25/17   Regalado, Belkys A, MD  midodrine (PROAMATINE) 2.5 MG tablet Take 3 tablets (7.5 mg total) by mouth 3 (three) times daily with meals. 09/24/17   Donne Hazel, MD  octreotide 500 mcg in sodium chloride 0.9 % 250 mL  Inject 50 mcg/hr into the vein continuous. 09/24/17   Donne Hazel, MD  oseltamivir (TAMIFLU) 75 MG capsule Take 1 capsule (75 mg total) by mouth every 12 (twelve) hours. 01/29/18   Wynona Luna, MD  sucralfate (CARAFATE) 1 g tablet TAKE 1 TABLET BY MOUTH 4 TIMES DAILY 08/23/17   Pyrtle, Lajuan Lines, MD  thiamine 100 MG tablet Take 1 tablet (100 mg total) by mouth daily. 09/20/17   Esterwood, Amy S, PA-C    Family History Family History  Problem Relation Age of Onset  . Diabetes Father   . Parkinson's disease Father   . Diabetes Paternal Aunt   . Glaucoma Mother     Social History Social History   Tobacco Use  . Smoking status: Never Smoker  . Smokeless tobacco: Never Used  Substance Use Topics  . Alcohol use: No  Alcohol/week: 0.0 oz    Comment: Quit 2/17, relapse in 1/18 for short period of time  . Drug use: No     Allergies   Xifaxan [rifaximin]   Review of Systems Review of Systems  All other systems reviewed and are negative.    Physical Exam Triage Vital Signs ED Triage Vitals  Enc Vitals Group     BP 01/29/18 1340 110/71     Pulse Rate 01/29/18 1340 (!) 103     Resp 01/29/18 1340 16     Temp 01/29/18 1340 99.3 F (37.4 C)     Temp Source 01/29/18 1340 Oral     SpO2 01/29/18 1340 99 %     Weight 01/29/18 1336 132 lb (59.9 kg)     Height --      Pain Score 01/29/18 1335 6     Pain Loc --    Updated Vital Signs BP 110/71   Pulse (!) 103   Temp 99.3 F (37.4 C) (Oral)   Resp 16   Wt 132 lb (59.9 kg)   SpO2 99%   BMI 21.97 kg/m   Physical Exam  Constitutional: He is oriented to person, place, and time. No distress.  Alert, nicely groomed Able to sit up on the exam table without difficulty  HENT:  Head: Atraumatic.  Bilateral TMs are mildly dull, no erythema Nasal mucosa is slightly inflamed, mildly congested Throat is slightly injected  Eyes:  Conjugate gaze, no eye redness/drainage  Neck: Neck supple.  Cardiovascular: Normal rate  and regular rhythm.  Pulmonary/Chest: No respiratory distress. He has no wheezes. He has no rales.  Lungs are clear, diminished in the right base; patient says that he has chronic fluid in the right lung base since his transplant.  Abdominal: He exhibits no distension.  Musculoskeletal: Normal range of motion.  Neurological: He is alert and oriented to person, place, and time.  Skin: Skin is warm and dry.  No cyanosis  Nursing note and vitals reviewed.    UC Treatments / Results   Procedures Procedures (including critical care time) None today  Final Clinical Impressions(s) / UC Diagnoses   Final diagnoses:  Influenza-like illness   Prescription for oseltamivir (for possible flu) sent to the pharmacy.  Please let your transplant coordinator know that you are starting this medicine.  Recheck or followup with your primary care provider for new fever >100.5, increasing phlegm production/nasal discharge, or if not starting to improve in a few days.     ED Discharge Orders        Ordered    oseltamivir (TAMIFLU) 75 MG capsule  Every 12 hours     01/29/18 1508         Wynona Luna, MD 01/31/18 [phone]

## 2018-01-29 NOTE — ED Triage Notes (Signed)
PT reports cough, chills,chest congestion that started last night.   Liver transplant 10/2017

## 2018-01-29 NOTE — Discharge Instructions (Addendum)
Prescription for oseltamivir (for possible flu) sent to the pharmacy.  Please let your transplant coordinator know that you are starting this medicine.  Recheck or followup with your primary care provider for new fever >100.5, increasing phlegm production/nasal discharge, or if not starting to improve in a few days.

## 2018-02-12 DIAGNOSIS — Z944 Liver transplant status: Secondary | ICD-10-CM | POA: Diagnosis not present

## 2018-02-18 DIAGNOSIS — T451X5A Adverse effect of antineoplastic and immunosuppressive drugs, initial encounter: Secondary | ICD-10-CM | POA: Diagnosis not present

## 2018-02-18 DIAGNOSIS — I10 Essential (primary) hypertension: Secondary | ICD-10-CM | POA: Diagnosis not present

## 2018-02-18 DIAGNOSIS — N141 Nephropathy induced by other drugs, medicaments and biological substances: Secondary | ICD-10-CM | POA: Diagnosis not present

## 2018-02-18 DIAGNOSIS — G6289 Other specified polyneuropathies: Secondary | ICD-10-CM | POA: Diagnosis not present

## 2018-02-18 DIAGNOSIS — D899 Disorder involving the immune mechanism, unspecified: Secondary | ICD-10-CM | POA: Diagnosis not present

## 2018-02-18 DIAGNOSIS — Z944 Liver transplant status: Secondary | ICD-10-CM | POA: Diagnosis not present

## 2018-02-18 DIAGNOSIS — N183 Chronic kidney disease, stage 3 (moderate): Secondary | ICD-10-CM | POA: Diagnosis not present

## 2018-03-05 DIAGNOSIS — Z944 Liver transplant status: Secondary | ICD-10-CM | POA: Diagnosis not present

## 2018-03-26 DIAGNOSIS — Z944 Liver transplant status: Secondary | ICD-10-CM | POA: Diagnosis not present

## 2018-03-26 DIAGNOSIS — R918 Other nonspecific abnormal finding of lung field: Secondary | ICD-10-CM | POA: Diagnosis not present

## 2018-03-26 DIAGNOSIS — J90 Pleural effusion, not elsewhere classified: Secondary | ICD-10-CM | POA: Diagnosis not present

## 2018-04-02 IMAGING — CR DG ABDOMEN ACUTE W/ 1V CHEST
3 series · 3 of 3 positions shown · non-contrast
Comparison: 06/14/2017

CLINICAL DATA: Abdominal pain.

EXAM:
DG ABDOMEN ACUTE W/ 1V CHEST

[chest pa]
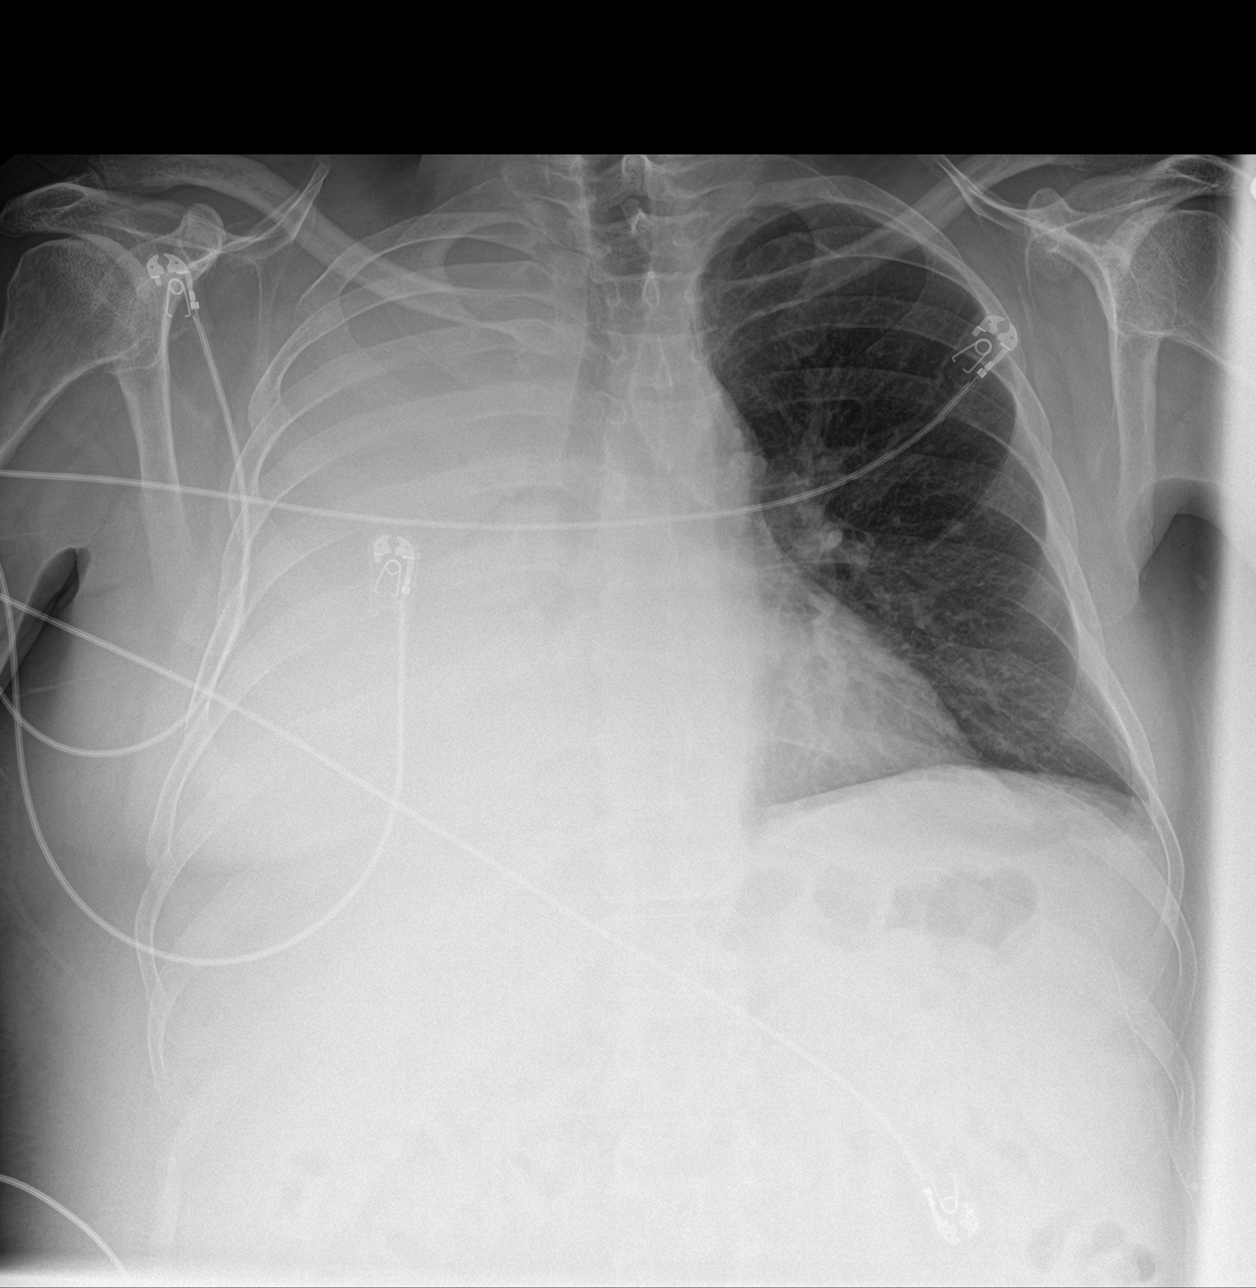

[abdomen erect]
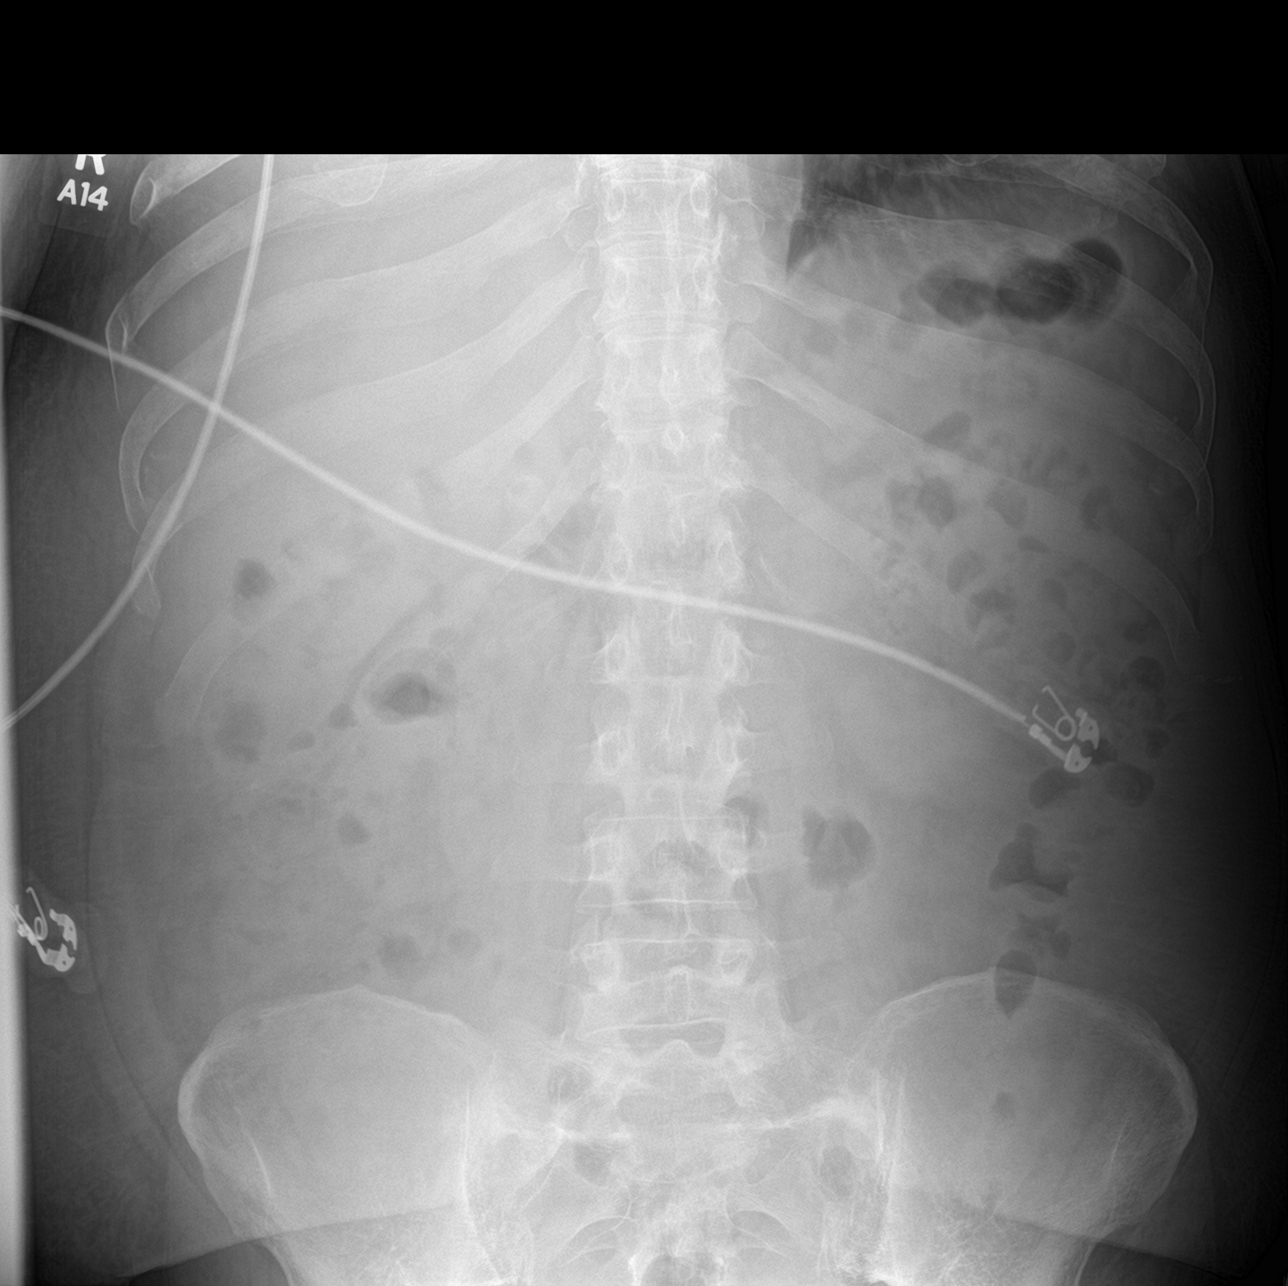

[abdomen supine]
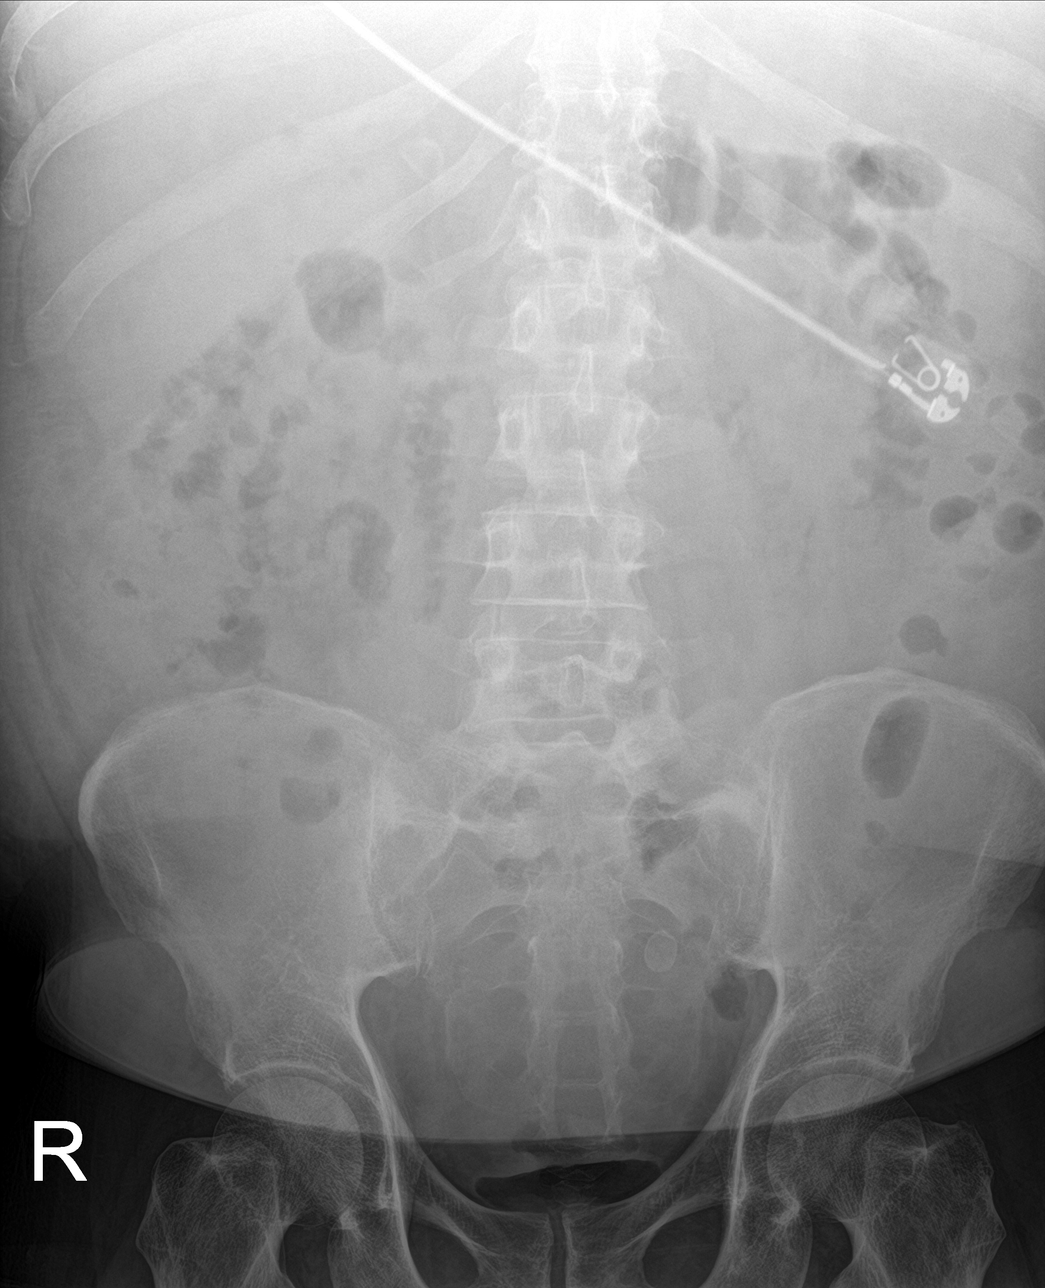

[3 of 3 positions shown; findings below may reference images not displayed]

FINDINGS: White out of the right chest. Patient has recurrent pleural effusion
on the right based on prior studies. The left lung is clear. Normal
visualized heart.

There could be gas in the urinary bladder. Nonobstructive bowel gas
pattern. No evidence of pneumoperitoneum.
IMPRESSION: 1. White out of the right chest. Patient has recurrent right pleural
effusion based on previous imaging.
2. Normal bowel gas pattern.
3. Question gas in the urinary bladder, has there been
catheterization for sampling?

## 2018-04-04 IMAGING — DX DG CHEST 1V
1 series · 1 of 1 positions shown · non-contrast
Comparison: None.

CLINICAL DATA: Right-sided thoracentesis

EXAM:
CHEST 1 VIEW

[x chest ap]
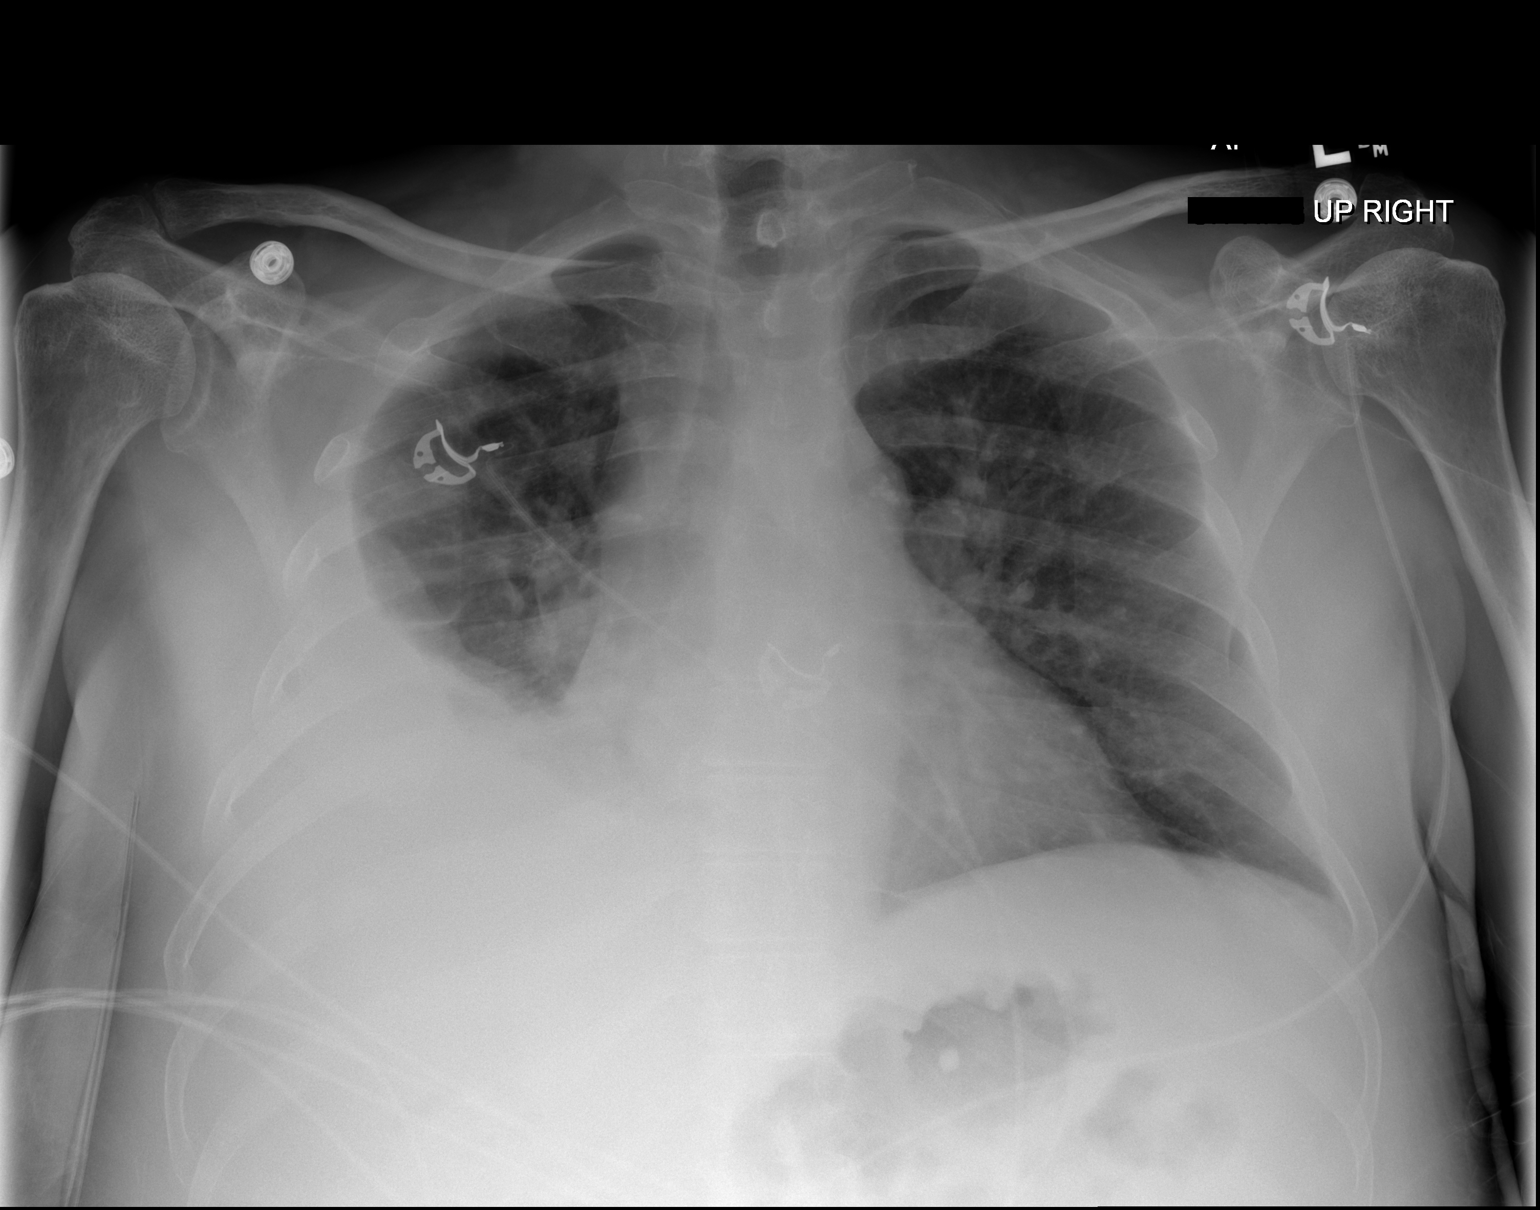

[1 of 1 positions shown; findings below may reference images not displayed]

FINDINGS: Moderate right pleural effusion. No left pleural effusion. No focal
consolidation. Bilateral mild thickening. No pneumothorax. Stable
cardiomediastinal silhouette. No acute osseous abnormality.
IMPRESSION: Moderate right pleural effusion.  No pneumothorax.

## 2018-04-09 ENCOUNTER — Other Ambulatory Visit: Payer: Self-pay | Admitting: Physician Assistant

## 2018-05-20 DIAGNOSIS — Z944 Liver transplant status: Secondary | ICD-10-CM | POA: Diagnosis not present

## 2018-05-23 DIAGNOSIS — Z944 Liver transplant status: Secondary | ICD-10-CM | POA: Diagnosis not present

## 2018-05-28 DIAGNOSIS — Z944 Liver transplant status: Secondary | ICD-10-CM | POA: Diagnosis not present

## 2018-06-10 ENCOUNTER — Other Ambulatory Visit: Payer: Self-pay | Admitting: Internal Medicine

## 2018-06-14 ENCOUNTER — Other Ambulatory Visit: Payer: Self-pay | Admitting: Physician Assistant

## 2018-06-17 DIAGNOSIS — Z944 Liver transplant status: Secondary | ICD-10-CM | POA: Diagnosis not present

## 2018-06-30 IMAGING — DX DG CHEST 1V PORT
1 series · 1 of 1 positions shown · non-contrast
Comparison: 09/05/2017

CLINICAL DATA: Lethargic.  Vomiting.

EXAM:
PORTABLE CHEST 1 VIEW

[chest ap]
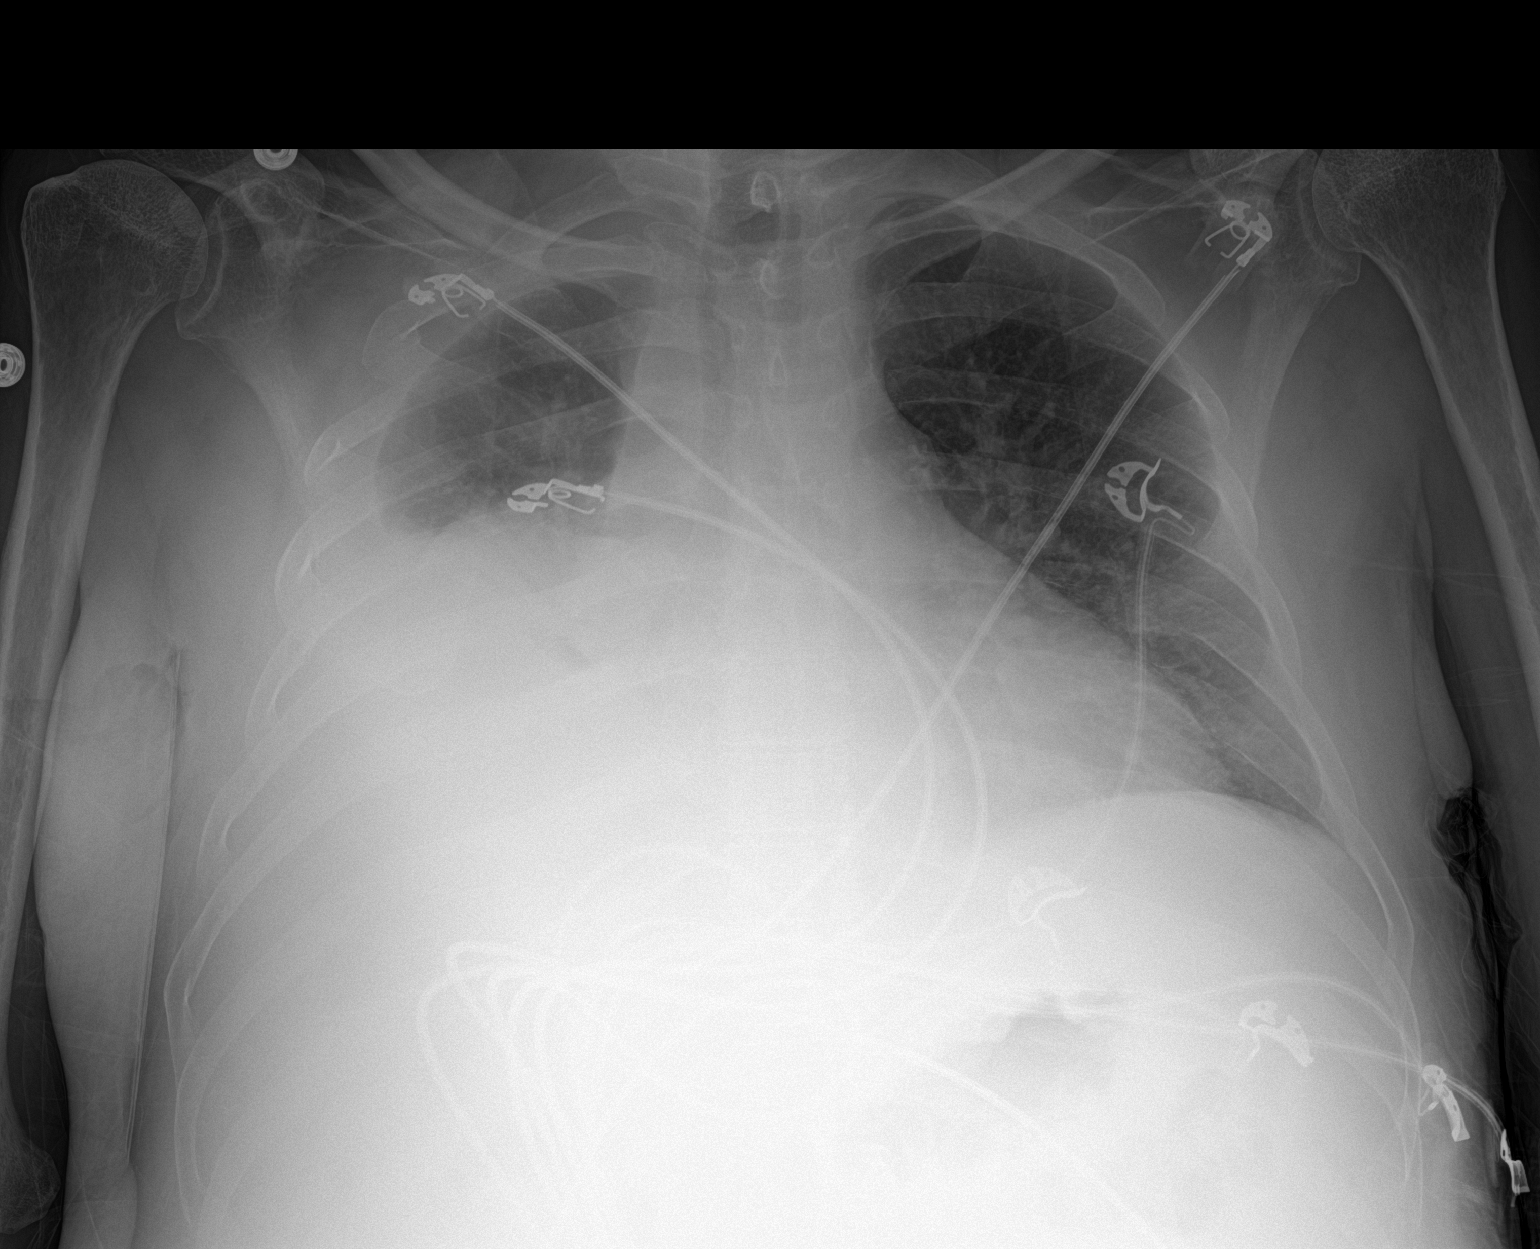

[1 of 1 positions shown; findings below may reference images not displayed]

FINDINGS: The cardiac silhouette is enlarged. Mediastinal contours appear
intact.

There is no evidence of pneumothorax. Interval increase in right
pleural effusion which occupies approximately 60% of the volume of
the right hemithorax. Suspected right lower lobe airspace
consolidation. Increased interstitial markings in the aerated lung.

Osseous structures are without acute abnormality. Soft tissues are
grossly normal.
IMPRESSION: Persistent enlargement of the cardiac silhouette with pulmonary
vascular congestion.

Enlarged right pleural effusion.

Suspected right lower lobe airspace consolidation versus
atelectasis.

## 2018-07-05 DIAGNOSIS — Z125 Encounter for screening for malignant neoplasm of prostate: Secondary | ICD-10-CM | POA: Diagnosis not present

## 2018-07-05 DIAGNOSIS — Z0000 Encounter for general adult medical examination without abnormal findings: Secondary | ICD-10-CM | POA: Diagnosis not present

## 2018-07-09 DIAGNOSIS — Z298 Encounter for other specified prophylactic measures: Secondary | ICD-10-CM | POA: Diagnosis not present

## 2018-07-09 DIAGNOSIS — Z944 Liver transplant status: Secondary | ICD-10-CM | POA: Diagnosis not present

## 2018-07-19 ENCOUNTER — Encounter: Payer: Self-pay | Admitting: Internal Medicine

## 2018-08-05 ENCOUNTER — Other Ambulatory Visit: Payer: Self-pay | Admitting: Internal Medicine

## 2018-08-09 ENCOUNTER — Other Ambulatory Visit: Payer: Self-pay | Admitting: Internal Medicine

## 2018-08-13 ENCOUNTER — Other Ambulatory Visit: Payer: Self-pay | Admitting: Physician Assistant

## 2018-08-15 ENCOUNTER — Telehealth: Payer: Self-pay | Admitting: *Deleted

## 2018-08-15 NOTE — Telephone Encounter (Signed)
Sent message to [facility] PA, asking if I should send a refill for the Magnesium?  Amy forwarded message to Dr. Hilarie Fredrickson. He said it is ok to refill the magesium.  Sent refill to patient's pharmacy.

## 2018-08-17 ENCOUNTER — Other Ambulatory Visit: Payer: Self-pay | Admitting: Physician Assistant

## 2018-08-19 DIAGNOSIS — Z944 Liver transplant status: Secondary | ICD-10-CM | POA: Diagnosis not present

## 2018-08-19 DIAGNOSIS — Z48298 Encounter for aftercare following other organ transplant: Secondary | ICD-10-CM | POA: Diagnosis not present

## 2018-08-19 DIAGNOSIS — D899 Disorder involving the immune mechanism, unspecified: Secondary | ICD-10-CM | POA: Diagnosis not present

## 2018-08-19 DIAGNOSIS — N182 Chronic kidney disease, stage 2 (mild): Secondary | ICD-10-CM | POA: Diagnosis not present

## 2018-08-19 DIAGNOSIS — Z5181 Encounter for therapeutic drug level monitoring: Secondary | ICD-10-CM | POA: Diagnosis not present

## 2018-08-19 DIAGNOSIS — I10 Essential (primary) hypertension: Secondary | ICD-10-CM | POA: Diagnosis not present

## 2018-08-30 DIAGNOSIS — Z944 Liver transplant status: Secondary | ICD-10-CM | POA: Diagnosis not present

## 2018-08-30 DIAGNOSIS — Z298 Encounter for other specified prophylactic measures: Secondary | ICD-10-CM | POA: Diagnosis not present

## 2018-09-03 ENCOUNTER — Other Ambulatory Visit: Payer: Self-pay | Admitting: Physician Assistant

## 2018-09-24 DIAGNOSIS — Z1159 Encounter for screening for other viral diseases: Secondary | ICD-10-CM | POA: Diagnosis not present

## 2018-09-24 DIAGNOSIS — Z23 Encounter for immunization: Secondary | ICD-10-CM | POA: Diagnosis not present

## 2018-09-24 DIAGNOSIS — K766 Portal hypertension: Secondary | ICD-10-CM | POA: Diagnosis not present

## 2018-09-24 DIAGNOSIS — Z4823 Encounter for aftercare following liver transplant: Secondary | ICD-10-CM | POA: Diagnosis not present

## 2018-09-24 DIAGNOSIS — J111 Influenza due to unidentified influenza virus with other respiratory manifestations: Secondary | ICD-10-CM | POA: Diagnosis not present

## 2018-09-24 DIAGNOSIS — F1011 Alcohol abuse, in remission: Secondary | ICD-10-CM | POA: Diagnosis not present

## 2018-09-24 DIAGNOSIS — D899 Disorder involving the immune mechanism, unspecified: Secondary | ICD-10-CM | POA: Diagnosis not present

## 2018-09-24 DIAGNOSIS — R188 Other ascites: Secondary | ICD-10-CM | POA: Diagnosis not present

## 2018-09-24 DIAGNOSIS — N189 Chronic kidney disease, unspecified: Secondary | ICD-10-CM | POA: Diagnosis not present

## 2018-09-24 DIAGNOSIS — Z944 Liver transplant status: Secondary | ICD-10-CM | POA: Diagnosis not present

## 2018-10-15 ENCOUNTER — Other Ambulatory Visit: Payer: Self-pay | Admitting: Internal Medicine

## 2018-12-13 ENCOUNTER — Other Ambulatory Visit: Payer: Self-pay | Admitting: Internal Medicine

## 2018-12-27 ENCOUNTER — Other Ambulatory Visit: Payer: Self-pay | Admitting: Internal Medicine

## 2018-12-31 ENCOUNTER — Other Ambulatory Visit: Payer: Self-pay | Admitting: Internal Medicine

## 2019-01-28 DIAGNOSIS — M8580 Other specified disorders of bone density and structure, unspecified site: Secondary | ICD-10-CM | POA: Diagnosis not present

## 2019-01-28 DIAGNOSIS — N182 Chronic kidney disease, stage 2 (mild): Secondary | ICD-10-CM | POA: Diagnosis not present

## 2019-01-28 DIAGNOSIS — I10 Essential (primary) hypertension: Secondary | ICD-10-CM | POA: Diagnosis not present

## 2019-01-28 DIAGNOSIS — D899 Disorder involving the immune mechanism, unspecified: Secondary | ICD-10-CM | POA: Diagnosis not present

## 2019-01-28 DIAGNOSIS — Z944 Liver transplant status: Secondary | ICD-10-CM | POA: Diagnosis not present

## 2019-01-28 DIAGNOSIS — G621 Alcoholic polyneuropathy: Secondary | ICD-10-CM | POA: Diagnosis not present

## 2019-03-14 DIAGNOSIS — Z944 Liver transplant status: Secondary | ICD-10-CM | POA: Diagnosis not present

## 2019-03-21 DIAGNOSIS — Z944 Liver transplant status: Secondary | ICD-10-CM | POA: Diagnosis not present

## 2019-04-24 DIAGNOSIS — Z944 Liver transplant status: Secondary | ICD-10-CM | POA: Diagnosis not present

## 2019-05-16 DIAGNOSIS — Z944 Liver transplant status: Secondary | ICD-10-CM | POA: Diagnosis not present

## 2019-06-09 DIAGNOSIS — Z944 Liver transplant status: Secondary | ICD-10-CM | POA: Diagnosis not present

## 2019-07-08 ENCOUNTER — Emergency Department (HOSPITAL_COMMUNITY): Payer: BLUE CROSS/BLUE SHIELD

## 2019-07-08 ENCOUNTER — Inpatient Hospital Stay (HOSPITAL_COMMUNITY)
Admission: EM | Admit: 2019-07-08 | Discharge: 2019-07-13 | DRG: 641 | Disposition: A | Discharge status: Home / Self Care | Payer: BLUE CROSS/BLUE SHIELD | Attending: Internal Medicine | Admitting: Internal Medicine

## 2019-07-08 ENCOUNTER — Ambulatory Visit (HOSPITAL_COMMUNITY)
Admission: EM | Admit: 2019-07-08 | Discharge: 2019-07-08 | Disposition: A | Discharge status: Home / Self Care | Payer: BLUE CROSS/BLUE SHIELD

## 2019-07-08 ENCOUNTER — Other Ambulatory Visit: Payer: Self-pay

## 2019-07-08 ENCOUNTER — Encounter (HOSPITAL_COMMUNITY): Payer: Self-pay | Admitting: Emergency Medicine

## 2019-07-08 DIAGNOSIS — E785 Hyperlipidemia, unspecified: Secondary | ICD-10-CM | POA: Diagnosis present

## 2019-07-08 DIAGNOSIS — I129 Hypertensive chronic kidney disease with stage 1 through stage 4 chronic kidney disease, or unspecified chronic kidney disease: Secondary | ICD-10-CM | POA: Diagnosis not present

## 2019-07-08 DIAGNOSIS — Z794 Long term (current) use of insulin: Secondary | ICD-10-CM

## 2019-07-08 DIAGNOSIS — S0181XA Laceration without foreign body of other part of head, initial encounter: Secondary | ICD-10-CM | POA: Diagnosis present

## 2019-07-08 DIAGNOSIS — S8012XA Contusion of left lower leg, initial encounter: Secondary | ICD-10-CM | POA: Diagnosis not present

## 2019-07-08 DIAGNOSIS — Z820 Family history of epilepsy and other diseases of the nervous system: Secondary | ICD-10-CM | POA: Diagnosis not present

## 2019-07-08 DIAGNOSIS — E860 Dehydration: Secondary | ICD-10-CM | POA: Diagnosis present

## 2019-07-08 DIAGNOSIS — Z833 Family history of diabetes mellitus: Secondary | ICD-10-CM | POA: Diagnosis not present

## 2019-07-08 DIAGNOSIS — L0390 Cellulitis, unspecified: Secondary | ICD-10-CM

## 2019-07-08 DIAGNOSIS — N182 Chronic kidney disease, stage 2 (mild): Secondary | ICD-10-CM | POA: Diagnosis present

## 2019-07-08 DIAGNOSIS — R000 Tachycardia, unspecified: Secondary | ICD-10-CM | POA: Diagnosis not present

## 2019-07-08 DIAGNOSIS — K219 Gastro-esophageal reflux disease without esophagitis: Secondary | ICD-10-CM | POA: Diagnosis not present

## 2019-07-08 DIAGNOSIS — Z944 Liver transplant status: Secondary | ICD-10-CM | POA: Diagnosis not present

## 2019-07-08 DIAGNOSIS — Z83511 Family history of glaucoma: Secondary | ICD-10-CM

## 2019-07-08 DIAGNOSIS — T380X5A Adverse effect of glucocorticoids and synthetic analogues, initial encounter: Secondary | ICD-10-CM | POA: Diagnosis not present

## 2019-07-08 DIAGNOSIS — Z79899 Other long term (current) drug therapy: Secondary | ICD-10-CM

## 2019-07-08 DIAGNOSIS — K3184 Gastroparesis: Secondary | ICD-10-CM | POA: Diagnosis not present

## 2019-07-08 DIAGNOSIS — E1122 Type 2 diabetes mellitus with diabetic chronic kidney disease: Secondary | ICD-10-CM | POA: Diagnosis present

## 2019-07-08 DIAGNOSIS — E1165 Type 2 diabetes mellitus with hyperglycemia: Secondary | ICD-10-CM | POA: Diagnosis not present

## 2019-07-08 DIAGNOSIS — Z7982 Long term (current) use of aspirin: Secondary | ICD-10-CM | POA: Diagnosis not present

## 2019-07-08 DIAGNOSIS — R531 Weakness: Secondary | ICD-10-CM | POA: Diagnosis not present

## 2019-07-08 DIAGNOSIS — I10 Essential (primary) hypertension: Secondary | ICD-10-CM | POA: Diagnosis present

## 2019-07-08 DIAGNOSIS — Y93K1 Activity, walking an animal: Secondary | ICD-10-CM

## 2019-07-08 DIAGNOSIS — N179 Acute kidney failure, unspecified: Secondary | ICD-10-CM | POA: Diagnosis present

## 2019-07-08 DIAGNOSIS — E871 Hypo-osmolality and hyponatremia: Principal | ICD-10-CM | POA: Diagnosis present

## 2019-07-08 DIAGNOSIS — T1490XA Injury, unspecified, initial encounter: Secondary | ICD-10-CM

## 2019-07-08 DIAGNOSIS — Z888 Allergy status to other drugs, medicaments and biological substances status: Secondary | ICD-10-CM | POA: Diagnosis not present

## 2019-07-08 DIAGNOSIS — E1142 Type 2 diabetes mellitus with diabetic polyneuropathy: Secondary | ICD-10-CM | POA: Diagnosis present

## 2019-07-08 DIAGNOSIS — R05 Cough: Secondary | ICD-10-CM | POA: Diagnosis not present

## 2019-07-08 DIAGNOSIS — E119 Type 2 diabetes mellitus without complications: Secondary | ICD-10-CM

## 2019-07-08 DIAGNOSIS — E1143 Type 2 diabetes mellitus with diabetic autonomic (poly)neuropathy: Secondary | ICD-10-CM | POA: Diagnosis present

## 2019-07-08 DIAGNOSIS — Z20828 Contact with and (suspected) exposure to other viral communicable diseases: Secondary | ICD-10-CM | POA: Diagnosis not present

## 2019-07-08 DIAGNOSIS — S0990XA Unspecified injury of head, initial encounter: Secondary | ICD-10-CM | POA: Diagnosis not present

## 2019-07-08 DIAGNOSIS — W19XXXA Unspecified fall, initial encounter: Secondary | ICD-10-CM | POA: Diagnosis present

## 2019-07-08 DIAGNOSIS — E876 Hypokalemia: Secondary | ICD-10-CM | POA: Diagnosis present

## 2019-07-08 DIAGNOSIS — L03116 Cellulitis of left lower limb: Secondary | ICD-10-CM | POA: Diagnosis present

## 2019-07-08 DIAGNOSIS — I4510 Unspecified right bundle-branch block: Secondary | ICD-10-CM | POA: Diagnosis present

## 2019-07-08 DIAGNOSIS — R112 Nausea with vomiting, unspecified: Secondary | ICD-10-CM | POA: Diagnosis not present

## 2019-07-08 LAB — COMPREHENSIVE METABOLIC PANEL
ALT: 18 U/L (ref 0–44)
AST: 31 U/L (ref 15–41)
Albumin: 3.6 g/dL (ref 3.5–5.0)
Alkaline Phosphatase: 67 U/L (ref 38–126)
Anion gap: 17 — ABNORMAL HIGH (ref 5–15)
BUN: 24 mg/dL — ABNORMAL HIGH (ref 6–20)
CO2: 20 mmol/L — ABNORMAL LOW (ref 22–32)
Calcium: 8.8 mg/dL — ABNORMAL LOW (ref 8.9–10.3)
Chloride: 89 mmol/L — ABNORMAL LOW (ref 98–111)
Creatinine, Ser: 1.91 mg/dL — ABNORMAL HIGH (ref 0.61–1.24)
GFR calc Af Amer: 44 mL/min — ABNORMAL LOW (ref >60)
GFR calc non Af Amer: 38 mL/min — ABNORMAL LOW (ref >60)
Glucose, Bld: 266 mg/dL — ABNORMAL HIGH (ref 70–99)
Potassium: 3.9 mmol/L (ref 3.5–5.1)
Sodium: 126 mmol/L — ABNORMAL LOW (ref 135–145)
Total Bilirubin: 1.7 mg/dL — ABNORMAL HIGH (ref 0.3–1.2)
Total Protein: 7 g/dL (ref 6.5–8.1)

## 2019-07-08 LAB — HEMOGLOBIN A1C
Hgb A1c MFr Bld: 5.2 % (ref 4.8–5.6)
Mean Plasma Glucose: 102.54 mg/dL

## 2019-07-08 LAB — LIPASE, BLOOD: Lipase: 20 U/L (ref 11–51)

## 2019-07-08 LAB — CBC
HCT: 38.7 % — ABNORMAL LOW (ref 39.0–52.0)
Hemoglobin: 13.4 g/dL (ref 13.0–17.0)
MCH: 35 pg — ABNORMAL HIGH (ref 26.0–34.0)
MCHC: 34.6 g/dL (ref 30.0–36.0)
MCV: 101 fL — ABNORMAL HIGH (ref 80.0–100.0)
Platelets: 66 10*3/uL — ABNORMAL LOW (ref 150–400)
RBC: 3.83 MIL/uL — ABNORMAL LOW (ref 4.22–5.81)
RDW: 12.6 % (ref 11.5–15.5)
WBC: 15.1 10*3/uL — ABNORMAL HIGH (ref 4.0–10.5)
nRBC: 0 % (ref 0.0–0.2)

## 2019-07-08 LAB — URINALYSIS, ROUTINE W REFLEX MICROSCOPIC
Bilirubin Urine: NEGATIVE
Glucose, UA: 150 mg/dL — AB
Ketones, ur: 5 mg/dL — AB
Leukocytes,Ua: NEGATIVE
Nitrite: NEGATIVE
Protein, ur: >=300 mg/dL — AB
Specific Gravity, Urine: 1.03 (ref 1.005–1.030)
pH: 5 (ref 5.0–8.0)

## 2019-07-08 LAB — POCT I-STAT EG7
Acid-Base Excess: 1 mmol/L (ref 0.0–2.0)
Bicarbonate: 24.6 mmol/L (ref 20.0–28.0)
Calcium, Ion: 1.04 mmol/L — ABNORMAL LOW (ref 1.15–1.40)
HCT: 41 % (ref 39.0–52.0)
Hemoglobin: 13.9 g/dL (ref 13.0–17.0)
O2 Saturation: 63 %
Potassium: 3.7 mmol/L (ref 3.5–5.1)
Sodium: 127 mmol/L — ABNORMAL LOW (ref 135–145)
TCO2: 26 mmol/L (ref 22–32)
pCO2, Ven: 37.1 mmHg — ABNORMAL LOW (ref 44.0–60.0)
pH, Ven: 7.429 (ref 7.250–7.430)
pO2, Ven: 31 mmHg — CL (ref 32.0–45.0)

## 2019-07-08 LAB — SODIUM, URINE, RANDOM: Sodium, Ur: <10 mmol/L

## 2019-07-08 LAB — BETA-HYDROXYBUTYRIC ACID: Beta-Hydroxybutyric Acid: 1.23 mmol/L — ABNORMAL HIGH (ref 0.05–0.27)

## 2019-07-08 LAB — OSMOLALITY: Osmolality: 275 mOsm/kg (ref 275–295)

## 2019-07-08 LAB — SARS CORONAVIRUS 2 BY RT PCR (HOSPITAL ORDER, PERFORMED IN ~~LOC~~ HOSPITAL LAB): SARS Coronavirus 2: NEGATIVE

## 2019-07-08 LAB — GLUCOSE, CAPILLARY: Glucose-Capillary: 138 mg/dL — ABNORMAL HIGH (ref 70–99)

## 2019-07-08 LAB — LACTIC ACID, PLASMA: Lactic Acid, Venous: 1.9 mmol/L (ref 0.5–1.9)

## 2019-07-08 MED ORDER — TACROLIMUS 1 MG PO CAPS
1.0000 mg | ORAL_CAPSULE | Freq: Two times a day (BID) | ORAL | Status: DC
  Administered 2019-07-08 – 2019-07-13 (×10): 1 mg via ORAL
  Filled 2019-07-08 (×10): qty 1

## 2019-07-08 MED ORDER — ACETAMINOPHEN 325 MG PO TABS
650.0000 mg | ORAL_TABLET | Freq: Four times a day (QID) | ORAL | Status: DC | PRN
  Administered 2019-07-10 – 2019-07-13 (×6): 650 mg via ORAL
  Filled 2019-07-08 (×6): qty 2

## 2019-07-08 MED ORDER — MYCOPHENOLATE MOFETIL 250 MG PO CAPS
500.0000 mg | ORAL_CAPSULE | Freq: Two times a day (BID) | ORAL | Status: DC
  Administered 2019-07-08 – 2019-07-13 (×10): 500 mg via ORAL
  Filled 2019-07-08 (×12): qty 2

## 2019-07-08 MED ORDER — SODIUM CHLORIDE 0.9 % IV SOLN
INTRAVENOUS | Status: DC
  Administered 2019-07-08: 18:00:00 100 mL/h via INTRAVENOUS
  Administered 2019-07-09 – 2019-07-10 (×4): via INTRAVENOUS

## 2019-07-08 MED ORDER — SODIUM CHLORIDE 0.9 % IV BOLUS
500.0000 mL | Freq: Once | INTRAVENOUS | Status: AC
  Administered 2019-07-08: 500 mL via INTRAVENOUS

## 2019-07-08 MED ORDER — ACETAMINOPHEN 650 MG RE SUPP: 650.0000 mg | Freq: Four times a day (QID) | RECTAL | Status: DC | PRN

## 2019-07-08 MED ORDER — ONDANSETRON HCL 4 MG PO TABS: 4.0000 mg | ORAL_TABLET | Freq: Four times a day (QID) | ORAL | Status: DC | PRN

## 2019-07-08 MED ORDER — SODIUM CHLORIDE 0.9% FLUSH: 3.0000 mL | Freq: Once | INTRAVENOUS | Status: DC

## 2019-07-08 MED ORDER — ONDANSETRON 4 MG PO TBDP
4.0000 mg | ORAL_TABLET | Freq: Once | ORAL | Status: AC
  Administered 2019-07-08: 4 mg via ORAL
  Filled 2019-07-08: qty 1

## 2019-07-08 MED ORDER — ASPIRIN 81 MG PO CHEW
81.0000 mg | CHEWABLE_TABLET | Freq: Every day | ORAL | Status: DC
  Administered 2019-07-09 – 2019-07-13 (×5): 81 mg via ORAL
  Filled 2019-07-08 (×5): qty 1

## 2019-07-08 MED ORDER — ENOXAPARIN SODIUM 40 MG/0.4ML ~~LOC~~ SOLN: 40.0000 mg | SUBCUTANEOUS | Status: DC

## 2019-07-08 MED ORDER — CARVEDILOL 12.5 MG PO TABS
12.5000 mg | ORAL_TABLET | Freq: Two times a day (BID) | ORAL | Status: DC
  Administered 2019-07-08 – 2019-07-11 (×6): 12.5 mg via ORAL
  Filled 2019-07-08 (×6): qty 1

## 2019-07-08 MED ORDER — DOCUSATE SODIUM 100 MG PO CAPS
100.0000 mg | ORAL_CAPSULE | Freq: Two times a day (BID) | ORAL | Status: DC
  Administered 2019-07-08 – 2019-07-13 (×8): 100 mg via ORAL
  Filled 2019-07-08 (×10): qty 1

## 2019-07-08 MED ORDER — ONDANSETRON HCL 4 MG/2ML IJ SOLN
4.0000 mg | Freq: Four times a day (QID) | INTRAMUSCULAR | Status: DC | PRN
  Administered 2019-07-09: 4 mg via INTRAVENOUS
  Filled 2019-07-08: qty 2

## 2019-07-08 MED ORDER — INSULIN ASPART 100 UNIT/ML ~~LOC~~ SOLN
0.0000 [IU] | Freq: Three times a day (TID) | SUBCUTANEOUS | Status: DC
  Administered 2019-07-09 – 2019-07-11 (×2): 2 [IU] via SUBCUTANEOUS

## 2019-07-08 MED ORDER — INSULIN ASPART 100 UNIT/ML ~~LOC~~ SOLN: 0.0000 [IU] | Freq: Every day | SUBCUTANEOUS | Status: DC

## 2019-07-08 NOTE — ED Notes (Signed)
Pt. Aware urine is needed. States they are unable to provide any at this time.

## 2019-07-08 NOTE — ED Notes (Addendum)
Patient reports a fall one week ago .  Patient tripped by dog.  Patient hit his head, initially said he had loc, then said he cannot remember.  Patient reports head pain, nausea, vomiting.  Patient has scab to a healing wound on forehead, left arm and left leg.  Patient has history of liver transplant.   Spoke to [employer], np about patient.  Directed to send patient to ed.  Patient escorted by wheelchair to his family (mother and sister) in parking lot and explained provider concerns and where to go.

## 2019-07-08 NOTE — ED Notes (Signed)
Notified Dr. Ronnald Nian of pts VBG results

## 2019-07-08 NOTE — Progress Notes (Signed)
  Pt orientation to unit, room and routine. Information packet given to patient/family and safety video watched.  Admission INP armband ID verified with patient/family, and in place. SR up x 2, fall risk assessment complete with Patient and family verbalizing understanding of risks associated with falls. Pt verbalizes an understanding of how to use the call bell and to call for help before getting out of bed.  Skin, clean-dry,  skin tear noted in bilateral knee and laceration to left forehead.  Will cont to monitor and assist as needed.  Cuca Benassi Shelda Pal, RN 07/08/2019 7:03 PM

## 2019-07-08 NOTE — ED Provider Notes (Signed)
I have personally seen and examined the patient. I have reviewed the documentation on PMH/FH/Soc Hx. I have discussed the plan of care with the resident and patient.  I have reviewed and agree with the resident's documentation. Please see associated encounter note.  Briefly, the patient is a 58 y.o. male here with nausea, vomiting, generalized weakness, fatigue.  Patient with history of liver transplant on immunosuppressants.  Patient states that he is noticed generalized weakness over the last several days to weeks.  Had a fall about a week or 2 ago while walking his dog.  Patient states that he has been struggling with intermittent nausea and vomiting.  Does not have any abdominal pain on exam.  Has diabetes.  Does not drink alcohol anymore.  Overall he is neurologically intact.  He appears dehydrated on exam.  EKG showed sinus tachycardia.  No ischemic changes.  Patient had lab work that was significant for hyponatremia at 126.  About 1 month ago his sodium was 141.  Anion gap is mildly elevated.  Blood sugar is elevated.  However normal pH.  Does not appear to have DKA but he does have electrolyte abnormalities that I think are contributing to his symptoms.  Imaging of his chest and head were unremarkable.  Coronavirus test is negative.  Overall could be symptoms secondary to gastroparesis/diabetes.  Believe he would benefit from an admission for further hydration, nausea control, trending of his electrolytes.  He appears symptomatic from his hyponatremia.  Patient hemodynamically stable throughout my care.  This chart was dictated using voice recognition software.  Despite best efforts to proofread,  errors can occur which can change the documentation meaning.     EKG Interpretation  Date/Time:  Tuesday July 08 2019 12:57:11 EDT Ventricular Rate:  105 PR Interval:  150 QRS Duration: 116 QT Interval:  384 QTC Calculation: 507 R Axis:   70 Text Interpretation:  Sinus tachycardia Incomplete right  bundle branch block Borderline ECG Confirmed by Thomas [name] [phone]) on 07/08/2019 1:02:47 PM         Thomas Sites, DO 07/08/19 1559

## 2019-07-08 NOTE — ED Provider Notes (Signed)
[facility] EMERGENCY DEPARTMENT Provider Note   CSN: [id number] Arrival date & time: 07/08/19  0957    History   Chief Complaint Chief Complaint  Patient presents with   Fatigue   Nausea   Fall    HPI Thomas [name] is a 58 y.o. male.     HPI Presents for evaluation of episodic nausea, vomiting, and general malaise.  He reports that over the last 3 to 4 months every 2 - 3 weeks he will have a 2 to 3-day stretch of persistent nausea with vomiting.  Around 6 episodes of emesis daily during his episodes.  During his periods he does not have abdominal pain, he describes as discomfort, and anything that he tries to eat including water he will follow back up.  No abdominal distention during these.  He states that he has felt more weak and tired over the last few days because he has been eating less, but currently on my exam feels well.  Was pulled by a dog a week ago and hit his head.  No neurologic symptoms since.  Past Medical History:  Diagnosis Date   Alcohol abuse 4818   Alcoholic cirrhosis ([place]) 04/6313   Anemia 02/2016   in setting of GI blood loss, but MCV macrocytic.    Coagulopathy ([place]) 05/2015   Diverticulosis 09/2015   Esophageal varices ([place]) 09/2015   small   GERD (gastroesophageal reflux disease)    Hyperlipidemia    Hypertension    Pneumonia    Portal hypertension ([place]) 09/2015   with portal gastropathy   Thrombocytopenia ([place]) 02/2016   Tubular adenoma of colon 09/2015    Patient Active Problem List   Diagnosis Date Noted   GI bleed 09/14/2017   Acute hepatic encephalopathy 09/04/2017   Acute upper GI bleed 06/13/2017   HCAP (healthcare-associated pneumonia) 04/27/2017   Acute renal failure (ARF) ([place]) 03/28/2017   Hyperglycemia 03/28/2017   Hypoalbuminemia 03/28/2017   Edema 03/28/2017   S/P thoracentesis    Symptomatic anemia    Dyspnea 03/03/2017   Hypomagnesemia 11/26/2016   Acute respiratory failure  with hypoxia ([place]) 11/24/2016   Pleural effusion associated with hepatic disorder 11/24/2016   Hypokalemia 11/24/2016   SBP (spontaneous bacterial peritonitis) ([place]) 05/09/2016   Severe sepsis ([place]) 05/09/2016   Decompensation of cirrhosis of liver ([place]) 05/07/2016   Hypotension 05/07/2016   Ascites due to alcoholic cirrhosis ([place]) 97/01/6377   Localized edema 58/85/0277   Alcoholic cirrhosis of liver with ascites ([place])    End stage liver disease ([place])    Upper GI bleed 03/05/2016   Coagulopathy ([place]) 03/05/2016   Acute kidney injury ([place]) 03/05/2016   Hepatic encephalopathy ([place]) 03/05/2016   Macrocytic anemia 03/05/2016   Thrombocytopenia ([place]) 03/05/2016   Esophageal varices ([place]) 03/05/2016   Iron deficiency anemia due to chronic blood loss 03/05/2016   Idiopathic esophageal varices without bleeding (H[facility])    Portal hypertensive gastropathy ([place])     Past Surgical History:  Procedure Laterality Date   ESOPHAGOGASTRODUODENOSCOPY N/A 03/05/2016   Procedure: ESOPHAGOGASTRODUODENOSCOPY (EGD);  Surgeon: Ladene Artist, MD;  Location: [name];  Service: Endoscopy;  Laterality: N/A;   ESOPHAGOGASTRODUODENOSCOPY N/A 04/29/2017   Procedure: ESOPHAGOGASTRODUODENOSCOPY (EGD);  Surgeon: Milus Banister, MD;  Location: [name];  Service: Endoscopy;  Laterality: N/A;   ESOPHAGOGASTRODUODENOSCOPY (EGD) WITH PROPOFOL N/A 03/29/2017   Procedure: ESOPHAGOGASTRODUODENOSCOPY (EGD) WITH PROPOFOL;  Surgeon: Doran Stabler, MD;  Location: [facility] ENDOSCOPY;  Service: Endoscopy;  Laterality: N/A;  IR PARACENTESIS  05/15/2017   IR PARACENTESIS  06/11/2017   IR PARACENTESIS  06/26/2017   IR PARACENTESIS  07/10/2017   IR PARACENTESIS  08/16/2017   IR PARACENTESIS  09/14/2017   IR THORACENTESIS ASP PLEURAL SPACE W/IMG GUIDE  06/28/2017   LIVER TRANSPLANT     testicle prosthesis          Home Medications    Prior to Admission medications   Medication Sig Start Date  End Date Taking? Authorizing Provider  aspirin 81 MG chewable tablet Chew 81 mg by mouth daily.   Yes [provider]  bismuth subsalicylate (PEPTO BISMOL) 262 MG chewable tablet Chew 262-524 mg by mouth 3 (three) times daily as needed for indigestion.   Yes [provider]  calcium carbonate (TUMS EX) 750 MG chewable tablet Chew 1-2 tablets by mouth 3 (three) times daily as needed for heartburn.   Yes [provider]  carvedilol (COREG) 12.5 MG tablet Take 12.5 mg by mouth 2 (two) times a day. 01/28/19 01/28/20 Yes [provider]  insulin regular (NOVOLIN R) 100 units/mL injection Inject 4-6 Units into the skin See admin instructions. Sliding scale 01/15/18  Yes [provider]  mycophenolate (CELLCEPT) 250 MG capsule Take 500 mg by mouth every 12 (twelve) hours. 05/01/19 04/30/20 Yes [provider]  tacrolimus (PROGRAF) 0.5 MG capsule Take 1 mg by mouth every 12 (twelve) hours. 06/10/19 06/09/20 Yes [provider]  albumin human 25 % bottle Inject 300 mLs (75 g total) into the vein daily. Patient not taking: Reported on 07/08/2019 09/24/17   Donne Hazel, MD  Ampicillin-Sulbactam 3 g in sodium chloride 0.9 % 100 mL Inject 3 g into the vein every 12 (twelve) hours. Patient not taking: Reported on 07/08/2019 09/24/17   Donne Hazel, MD  benzonatate (TESSALON) 100 MG capsule Take 1 capsule (100 mg total) by mouth 2 (two) times daily. Patient not taking: Reported on 07/08/2019 09/07/17   Robbie Lis, MD  ferrous sulfate 325 (65 FE) MG tablet Take 1 tablet (325 mg total) by mouth 3 (three) times daily with meals. Patient not taking: Reported on 07/08/2019 04/30/17   Patrecia Pour, Christean Grief, MD  folic acid (FOLVITE) 1 MG tablet TAKE 1 TABLET BY MOUTH AT BEDTIME Patient not taking: Reported on 07/08/2019 08/20/17   Pyrtle, Lajuan Lines, MD  lactulose (CEPHULAC) 20 g packet Take 1 packet (20 g total) by mouth 3 (three) times daily. Patient not taking:  Reported on 07/08/2019 08/25/17   [place], Jerald Kief A, MD  magnesium oxide (MAG-OX) 400 MG tablet TAKE 1/2 TABLET TWICE DAILY. Patient not taking: Reported on 07/08/2019 08/15/18   Pyrtle, Lajuan Lines, MD  midodrine (PROAMATINE) 2.5 MG tablet Take 3 tablets (7.5 mg total) by mouth 3 (three) times daily with meals. Patient not taking: Reported on 07/08/2019 09/24/17   Donne Hazel, MD  oseltamivir (TAMIFLU) 75 MG capsule Take 1 capsule (75 mg total) by mouth every 12 (twelve) hours. Patient not taking: Reported on 07/08/2019 01/29/18   Wynona Luna, MD  pantoprazole (PROTONIX) 40 MG tablet TAKE 1 TABLET BY MOUTH TWICE A DAY. NEEDS OFFICE VISIT FOR FURTHER REFILLS Patient not taking: Reported on 07/08/2019 12/13/18   Pyrtle, Lajuan Lines, MD  sucralfate (CARAFATE) 1 g tablet TAKE 1 TABLET BY MOUTH 4 TIMES DAILY Patient not taking: Reported on 07/08/2019 08/23/17   Pyrtle, Lajuan Lines, MD  thiamine 100 MG tablet Take 1 tablet (100 mg total) by mouth daily. Patient  not taking: Reported on 07/08/2019 09/20/17   Alfredia Ferguson, PA-C    Family History Family History  Problem Relation Age of Onset   Diabetes Father    Parkinson's disease Father    Diabetes Paternal Aunt    Glaucoma Mother     Social History Social History   Tobacco Use   Smoking status: Never Smoker   Smokeless tobacco: Never Used  Substance Use Topics   Alcohol use: No    Alcohol/week: 0.0 standard drinks    Comment: Quit 2/17, relapse in 1/18 for short period of time   Drug use: No     Allergies   Xifaxan [rifaximin]   Review of Systems Review of Systems  Constitutional: Positive for fever.  Gastrointestinal: Positive for nausea and vomiting.  All other systems reviewed and are negative.    Physical Exam Updated Vital Signs BP (!) 146/91    Pulse 95    Temp 98.1 F (36.7 C)    Resp (!) 21    SpO2 99%   Physical Exam Vitals signs and nursing note reviewed.  Constitutional:      Appearance: He is well-developed.    HENT:     Head: Normocephalic.     Comments: Well-healing small laceration (<1 cm) over the left forehead. Eyes:     Conjunctiva/sclera: Conjunctivae normal.  Neck:     Musculoskeletal: Neck supple.  Cardiovascular:     Rate and Rhythm: Regular rhythm. Tachycardia present.     Heart sounds: No murmur.  Pulmonary:     Effort: Pulmonary effort is normal. No respiratory distress.     Breath sounds: Normal breath sounds.  Abdominal:     Palpations: Abdomen is soft.     Tenderness: There is no abdominal tenderness.  Skin:    General: Skin is warm and dry.  Neurological:     Mental Status: He is alert.      ED Treatments / Results  Labs (all labs ordered are listed, but only abnormal results are displayed) Labs Reviewed  COMPREHENSIVE METABOLIC PANEL - Abnormal; Notable for the following components:      Result Value   Sodium 126 (*)    Chloride 89 (*)    CO2 20 (*)    Glucose, Bld 266 (*)    BUN 24 (*)    Creatinine, Ser 1.91 (*)    Calcium 8.8 (*)    Total Bilirubin 1.7 (*)    GFR calc non Af Amer 38 (*)    GFR calc Af Amer 44 (*)    Anion gap 17 (*)    All other components within normal limits  CBC - Abnormal; Notable for the following components:   WBC 15.1 (*)    RBC 3.83 (*)    HCT 38.7 (*)    MCV 101.0 (*)    MCH 35.0 (*)    Platelets 66 (*)    All other components within normal limits  URINALYSIS, ROUTINE W REFLEX MICROSCOPIC - Abnormal; Notable for the following components:   Color, Urine AMBER (*)    APPearance CLOUDY (*)    Glucose, UA 150 (*)    Hgb urine dipstick MODERATE (*)    Ketones, ur 5 (*)    Protein, ur >=300 (*)    Bacteria, UA RARE (*)    All other components within normal limits  BETA-HYDROXYBUTYRIC ACID - Abnormal; Notable for the following components:   Beta-Hydroxybutyric Acid 1.23 (*)    All other components within normal limits  POCT I-STAT EG7 - Abnormal; Notable for the following components:   pCO2, Ven 37.1 (*)    pO2, Ven  31.0 (*)    Sodium 127 (*)    Calcium, Ion 1.04 (*)    All other components within normal limits  SARS CORONAVIRUS 2 (HOSPITAL ORDER, [place] [facility])  LIPASE, BLOOD  LACTIC ACID, PLASMA  SODIUM, URINE, RANDOM  OSMOLALITY  I-STAT VENOUS BLOOD GAS, ED    EKG EKG Interpretation  Date/Time:  Tuesday July 08 2019 12:57:11 EDT Ventricular Rate:  105 PR Interval:  150 QRS Duration: 116 QT Interval:  384 QTC Calculation: 507 R Axis:   70 Text Interpretation:  Sinus tachycardia Incomplete right bundle branch block Borderline ECG Confirmed by Lennice Sites [phone]) on 07/08/2019 1:02:47 PM   Radiology Ct Head Wo Contrast  Result Date: 07/08/2019 CLINICAL DATA:  58 year old male status post fall 1 week ago with subsequent weakness6, nausea. EXAM: CT HEAD WITHOUT CONTRAST TECHNIQUE: Contiguous axial images were obtained from the base of the skull through the vertex without intravenous contrast. COMPARISON:  Head CT 04/27/2017. FINDINGS: Brain: Stable cerebral volume since 2018. No midline shift, ventriculomegaly, mass effect, evidence of mass lesion, intracranial hemorrhage or evidence of cortically based acute infarction. Gray-white matter differentiation is within normal limits throughout the brain. Vascular: Calcified atherosclerosis at the skull base. No suspicious intracranial vascular hyperdensity. Skull: No acute osseous abnormality identified. Sinuses/Orbits: Partial opacification of the posterior right ethmoids is new since 2018. Other paranasal sinuses and mastoids are stable and well pneumatized. Tympanic cavities are clear. Other: Small left forehead scalp hematoma and/or laceration on series 4, image 47. Otherwise negative orbit and scalp soft tissues. IMPRESSION: 1. Left forehead scalp soft tissue injury without underlying skull fracture. 2. Stable and negative for age non contrast CT appearance of the brain. Electronically Signed   By: Genevie Ann M.D.   On:  07/08/2019 14:03   Dg Chest Portable 1 View  Result Date: 07/08/2019 CLINICAL DATA:  57 year old male status post fall 1 week ago with weakness, nausea, cough. EXAM: PORTABLE CHEST 1 VIEW COMPARISON:  09/23/2017 portable chest and earlier. FINDINGS: Portable AP semi upright view at 1327 hours. Chronic elevation of the right hemidiaphragm, but possible superimposed small right pleural effusion. No right rib fracture identified. No pneumothorax identified. Cardiac and mediastinal contours are stable and within normal limits. Visualized tracheal air column is within normal limits. Allowing for portable technique the left lung is clear. The right upper lung is clear. Negative visible bowel gas pattern. Surgical clips in the upper abdomen. IMPRESSION: 1. Possible small right pleural effusion superimposed on chronic elevation of the right hemidiaphragm. 2. No other acute cardiopulmonary abnormality. Electronically Signed   By: Genevie Ann M.D.   On: 07/08/2019 13:58    Procedures Procedures (including critical care time)  Medications Ordered in ED Medications  sodium chloride flush (NS) 0.9 % injection 3 mL (3 mLs Intravenous Not Given 07/08/19 1452)  ondansetron (ZOFRAN-ODT) disintegrating tablet 4 mg (4 mg Oral Given 07/08/19 1449)  sodium chloride 0.9 % bolus 500 mL (0 mLs Intravenous Stopped 07/08/19 1527)     Initial Impression / Assessment and Plan / ED Course  I have reviewed the triage vital signs and the nursing notes.  Pertinent labs & imaging results that were available during my care of the patient were reviewed by me and considered in my medical decision making (see chart for details).       Patient presents with  history that seems most consistent with gastroparesis.  Known long-term with peripheral neuropathy.  Could be obstruction given extensive surgical history, but this is less likely given that he does not have accompanying distention and abdominal pain.  His exam now is benign but  work-up is notable for significant hyponatremia, hypochloremia, hyperglycemia.  Anion gap of 17.  Lactic within normal limits and he is not acidotic on VBG but is hypocarbic.  Positive beta hydroxybutyrate.  CT head is negative for intracranial abnormality after fall last week.  Mixed picture makes clear DKA unlikely, but given symptoms, degree of hyponatremia without clear explanation and patient's symptomatic state as he feels generalized weakness now, will admit to hospitalist service for continued inpatient treatment.  Final Clinical Impressions(s) / ED Diagnoses   Final diagnoses:  Hyponatremia  History of liver transplant [facility])    ED Discharge Orders    None       Tillie Fantasia, MD 07/08/19 [place], [place], DO 07/08/19 [phone]

## 2019-07-08 NOTE — ED Triage Notes (Signed)
Pt reports a fall 1 week ago after he tripped over a dog, states that he has been generally weak and nauseated since. Does not think he had LOC with fall and was not seen by a medical provider. Hx of liver transplant. No blood thinners. Pt a/ox4, resp e/u, nad, speech clear.

## 2019-07-08 NOTE — ED Notes (Signed)
Iv attempted x 2 withput success. 

## 2019-07-08 NOTE — H&P (Signed)
History and Physical    Thomas [name] WYO:[id number] DOB: [birth year] DOA: 07/08/2019  PCP: Alroy Dust, L.Marlou Sa, MD Consultants:  [place] [facility]; [facility] Patient coming from:  Home - lives with mother (separated form wife); NOK: Mother, [phone]  Chief Complaint: N/V, fatigue, fall  HPI: Thomas [name] is a 58 y.o. 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  He reports nausea problems and also was "drug down by the dog."  He was walking the dog and it dragged him down.  He has been nausea and vomiting spells.  Every 3-4 weeks, he seems to have a couple of days where he has n/v and then it subsides for a while.  His last episode was last weekend.  They went to [facility] and they suggested he come to the ER for a CT scan.  He hasn't been able to keep anything on his stomach with these spells.  He is improving and has been better able to eat/drink yesterday and today - he had a 1/2 PB sandwich yesterday and a graham cracker this AM.  He thinks he has been doing well following his transplant.  Since his fall, he has been using a cane.  He gets weak and unsteady with his vomiting episodes.   ED Course:  S/p liver transplant.  He is hyponatremic - ?cause.  Also with anion gap 17, no acidosis, does not appear to be DKA.  He does not appear terribly dehydrated, but unable to take PO and has hyperglycemia.  Na++ last month 140s.  Reports 2-3 days of episodic n/v over the last few months.  Had episode this week, feels better now.  ?gastroparesis.  Normal head CT.    Review of Systems: As per HPI; otherwise review of systems reviewed and negative.   Ambulatory Status:  Ambulates without assistance  Past Medical History:  Diagnosis Date  . Alcohol abuse 2013  . Alcoholic cirrhosis ([place]) 05/7208  . Anemia 02/2016   in setting of GI blood loss, but MCV macrocytic.   . Coagulopathy ([place]) 05/2015  . Diverticulosis 09/2015  . Esophageal varices ([place]) 09/2015   small  . GERD (gastroesophageal reflux disease)   . Hyperlipidemia   . Hypertension   . Pneumonia   . Portal hypertension ([place]) 09/2015   with portal gastropathy  . Thrombocytopenia ([place]) 02/2016  . Tubular adenoma of colon 09/2015    Past Surgical History:  Procedure Laterality Date  . ESOPHAGOGASTRODUODENOSCOPY N/A 03/05/2016   Procedure: ESOPHAGOGASTRODUODENOSCOPY (EGD);  Surgeon: Ladene Artist, MD;  Location: [name];  Service: Endoscopy;  Laterality: N/A;  . ESOPHAGOGASTRODUODENOSCOPY N/A 04/29/2017   Procedure: ESOPHAGOGASTRODUODENOSCOPY (EGD);  Surgeon: Milus Banister, MD;  Location: [name];  Service: Endoscopy;  Laterality: N/A;  . ESOPHAGOGASTRODUODENOSCOPY (EGD) WITH PROPOFOL N/A 03/29/2017   Procedure: ESOPHAGOGASTRODUODENOSCOPY (EGD) WITH PROPOFOL;  Surgeon: Doran Stabler, MD;  Location: [facility];  Service: Endoscopy;  Laterality: N/A;  . IR PARACENTESIS  05/15/2017  . IR PARACENTESIS  06/11/2017  . IR PARACENTESIS  06/26/2017  . IR PARACENTESIS  07/10/2017  . IR PARACENTESIS  08/16/2017  . IR PARACENTESIS  09/14/2017  . IR THORACENTESIS ASP PLEURAL SPACE W/IMG GUIDE  06/28/2017  . LIVER TRANSPLANT    . testicle prosthesis      Social History   Socioeconomic History  . Marital status: Married  Spouse name: Lelan Pons  . Number of children: 3  . Years of education: Not on file  . Highest education level: Not on file  Occupational History  . Occupation: disability  Social Needs  . Financial resource strain: Not on file  . Food insecurity    Worry: Not on file    Inability: Not on file  . Transportation needs    Medical: Not on file    Non-medical: Not on file  Tobacco Use  . Smoking status: Never Smoker  . Smokeless tobacco: Never Used  Substance and Sexual Activity  . Alcohol use: Not Currently     Alcohol/week: 0.0 standard drinks    Comment: Remote h/o heavy use; Last drink has "been a while" - slipped once in mid-May  . Drug use: No  . Sexual activity: Not Currently  Lifestyle  . Physical activity    Days per week: Not on file    Minutes per session: Not on file  . Stress: Not on file  Relationships  . Social Herbalist on phone: Not on file    Gets together: Not on file    Attends religious service: Not on file    Active member of club or organization: Not on file    Attends meetings of clubs or organizations: Not on file    Relationship status: Not on file  . Intimate partner violence    Fear of current or ex partner: Not on file    Emotionally abused: Not on file    Physically abused: Not on file    Forced sexual activity: Not on file  Other Topics Concern  . Not on file  Social History Narrative   Lives with wife.  Normally able to ambulate without assistance.    Allergies  Allergen Reactions  . Xifaxan [Rifaximin] Swelling and Other (See Comments)    Reaction:  All over body swelling     Family History  Problem Relation Age of Onset  . Diabetes Father   . Parkinson's disease Father   . Diabetes Paternal Aunt   . Glaucoma Mother     Prior to Admission medications   Medication Sig Start Date End Date Taking? Authorizing Provider  aspirin 81 MG chewable tablet Chew 81 mg by mouth daily.   Yes [provider]  bismuth subsalicylate (PEPTO BISMOL) 262 MG chewable tablet Chew 262-524 mg by mouth 3 (three) times daily as needed for indigestion.   Yes [provider]  calcium carbonate (TUMS EX) 750 MG chewable tablet Chew 1-2 tablets by mouth 3 (three) times daily as needed for heartburn.   Yes [provider]  carvedilol (COREG) 12.5 MG tablet Take 12.5 mg by mouth 2 (two) times a day. 01/28/19 01/28/20 Yes [provider]  insulin regular (NOVOLIN R) 100 units/mL injection Inject 4-6 Units into the skin See admin  instructions. Sliding scale 01/15/18  Yes [provider]  mycophenolate (CELLCEPT) 250 MG capsule Take 500 mg by mouth every 12 (twelve) hours. 05/01/19 04/30/20 Yes [provider]  tacrolimus (PROGRAF) 0.5 MG capsule Take 1 mg by mouth every 12 (twelve) hours. 06/10/19 06/09/20 Yes [provider]  albumin human 25 % bottle Inject 300 mLs (75 g total) into the vein daily. Patient not taking: Reported on 07/08/2019 09/24/17   Donne Hazel, MD  Ampicillin-Sulbactam 3 g in sodium chloride 0.9 % 100 mL Inject 3 g into the vein every 12 (twelve) hours. Patient not taking: Reported on  07/08/2019 09/24/17   Donne Hazel, MD  benzonatate (TESSALON) 100 MG capsule Take 1 capsule (100 mg total) by mouth 2 (two) times daily. Patient not taking: Reported on 07/08/2019 09/07/17   Robbie Lis, MD  ferrous sulfate 325 (65 FE) MG tablet Take 1 tablet (325 mg total) by mouth 3 (three) times daily with meals. Patient not taking: Reported on 07/08/2019 04/30/17   Patrecia Pour, Christean Grief, MD  folic acid (FOLVITE) 1 MG tablet TAKE 1 TABLET BY MOUTH AT BEDTIME Patient not taking: Reported on 07/08/2019 08/20/17   Pyrtle, Lajuan Lines, MD  lactulose (CEPHULAC) 20 g packet Take 1 packet (20 g total) by mouth 3 (three) times daily. Patient not taking: Reported on 07/08/2019 08/25/17   Regalado, Jerald Kief A, MD  magnesium oxide (MAG-OX) 400 MG tablet TAKE 1/2 TABLET TWICE DAILY. Patient not taking: Reported on 07/08/2019 08/15/18   Pyrtle, Lajuan Lines, MD  midodrine (PROAMATINE) 2.5 MG tablet Take 3 tablets (7.5 mg total) by mouth 3 (three) times daily with meals. Patient not taking: Reported on 07/08/2019 09/24/17   Donne Hazel, MD  oseltamivir (TAMIFLU) 75 MG capsule Take 1 capsule (75 mg total) by mouth every 12 (twelve) hours. Patient not taking: Reported on 07/08/2019 01/29/18   Wynona Luna, MD  pantoprazole (PROTONIX) 40 MG tablet TAKE 1 TABLET BY MOUTH TWICE A DAY. NEEDS OFFICE VISIT FOR FURTHER REFILLS  Patient not taking: Reported on 07/08/2019 12/13/18   Pyrtle, Lajuan Lines, MD  sucralfate (CARAFATE) 1 g tablet TAKE 1 TABLET BY MOUTH 4 TIMES DAILY Patient not taking: Reported on 07/08/2019 08/23/17   Pyrtle, Lajuan Lines, MD  thiamine 100 MG tablet Take 1 tablet (100 mg total) by mouth daily. Patient not taking: Reported on 07/08/2019 09/20/17   Alfredia Ferguson, PA-C    Physical Exam: Vitals:   07/08/19 1615 07/08/19 1630 07/08/19 1645 07/08/19 1700  BP: (!) 146/91 (!) 159/87 (!) 141/96 (!) 151/89  Pulse: 95 87 (!) 102 90  Resp: (!) 21 (!) 24 19 (!) 23  Temp:      TempSrc:      SpO2: 99% 98% 100% 99%     . General:  Appears calm and comfortable and is NAD . Eyes:  PERRL, EOMI, normal lids, iris . ENT:  grossly normal hearing, lips & tongue, mmm; appropriate dentition . Neck:  no LAD, masses or thyromegaly . Cardiovascular:  RRR, no m/r/g. No LE edema.  Marland Kitchen Respiratory:   CTA bilaterally with no wheezes/rales/rhonchi.  Normal respiratory effort. . Abdomen:  soft, NT, ND, NABS; well healed surgical scar across RUQ . Skin:  no rash or induration seen on limited exam; small healing left forehead laceration above left eyebrow and scattered excoriations from recent fall . Musculoskeletal:  grossly normal tone BUE/BLE, good ROM, no bony abnormality . Lower extremity:  No LE edema.  Limited foot exam with no ulcerations.  2+ distal pulses. Marland Kitchen Psychiatric:  blunted mood and affect, speech fluent and appropriate, AOx3 . Neurologic:  CN 2-12 grossly intact, moves all extremities in coordinated fashion    Radiological Exams on Admission: Ct Head Wo Contrast  Result Date: 07/08/2019 CLINICAL DATA:  58 year old male status post fall 1 week ago with subsequent weakness6, nausea. EXAM: CT HEAD WITHOUT CONTRAST TECHNIQUE: Contiguous axial images were obtained from the base of the skull through the vertex without intravenous contrast. COMPARISON:  Head CT 04/27/2017. FINDINGS: Brain: Stable cerebral volume since  2018. No midline shift, ventriculomegaly, mass effect, evidence  of mass lesion, intracranial hemorrhage or evidence of cortically based acute infarction. Gray-white matter differentiation is within normal limits throughout the brain. Vascular: Calcified atherosclerosis at the skull base. No suspicious intracranial vascular hyperdensity. Skull: No acute osseous abnormality identified. Sinuses/Orbits: Partial opacification of the posterior right ethmoids is new since 2018. Other paranasal sinuses and mastoids are stable and well pneumatized. Tympanic cavities are clear. Other: Small left forehead scalp hematoma and/or laceration on series 4, image 47. Otherwise negative orbit and scalp soft tissues. IMPRESSION: 1. Left forehead scalp soft tissue injury without underlying skull fracture. 2. Stable and negative for age non contrast CT appearance of the brain. Electronically Signed   By: Genevie Ann M.D.   On: 07/08/2019 14:03   Dg Chest Portable 1 View  Result Date: 07/08/2019 CLINICAL DATA:  58 year old male status post fall 1 week ago with weakness, nausea, cough. EXAM: PORTABLE CHEST 1 VIEW COMPARISON:  09/23/2017 portable chest and earlier. FINDINGS: Portable AP semi upright view at 1327 hours. Chronic elevation of the right hemidiaphragm, but possible superimposed small right pleural effusion. No right rib fracture identified. No pneumothorax identified. Cardiac and mediastinal contours are stable and within normal limits. Visualized tracheal air column is within normal limits. Allowing for portable technique the left lung is clear. The right upper lung is clear. Negative visible bowel gas pattern. Surgical clips in the upper abdomen. IMPRESSION: 1. Possible small right pleural effusion superimposed on chronic elevation of the right hemidiaphragm. 2. No other acute cardiopulmonary abnormality. Electronically Signed   By: Genevie Ann M.D.   On: 07/08/2019 13:58    EKG: Independently reviewed.  Sinus tachycardia with  rate 105; incomplete RBBB; no evidence of acute ischemia   Labs on Admission: I have personally reviewed the available labs and imaging studies at the time of the admission.  Pertinent labs:   Na++ 126; 135 on 2/18 CO2 20 Glucose 266 BUN 24/Creatinine 1.91/GFR 38; 14/1.6/47 on 2/18 Anion gap 17; 13 on 2/18 Bili 1.7; 0.9 on 2/18 Lactate 1.9 Osm 275 WBC 15.1 Platelets 66; 242 on 2/18 but previously low in 2018 VBG: 7.429/37.1/24.6 Beta-hydroxybutyrate 1.23 UA: 150 glucose; moderate Hgb; 5 ketones; >300 protein; rare bacteria Urine sodium <10  Assessment/Plan Principal Problem:   Dehydration with hyponatremia Active Problems:   Acute kidney injury superimposed on CKD (HCC)   Diabetes mellitus type 2 in nonobese [facility])   Essential hypertension   Diabetic gastroparesis (HCC)   H/O liver transplant ([place])   Hyponatremia -Etiology appears to be hypovolemic hyponatremia; patient with episodic n/v which occurred again over the weekend and ongoing decreased PO intake.   -He does not appear clinically dehydrated and so suspect that this has begun to resolve but his labs are lagging behind. -He does still have mild ketonuria with a mildly elevated anion gap -His only significant symptom appears to be weakness, and this may improve with rehydration and correction of the hyponatremia -He does not appear to be at risk for central pontine myelinolysis -Serum osmolality is low-normal -Urine sodium was quite low, which is consistent with dehydration. -He does have marked proteinuria, but this may correct with rehydration -Will give NS at 100 cc/hr and recheck labs in AM -Will recheck his UA in AM - if proteinuria is not improving, consider nephrology consult for consideration of renal biopsy  Probable gastroparesis -Patient appears to have episodic n/v which would be c/w gastroparesis -A gastric emptying study could be considered as an outpatient -Trial of Reglan or erythromycin could be  considered, as well -For now, however, given acute illness, would suggest no additional medications and monitoring since his symptoms have currently improved  DM -This was previously attributed to steroid-induced hyperglycemia and so has been covered with SSI without PO medication -Will check A1c -Will cover with moderate-scale SSI for now, but consider long-term therapy rather than reactive treatment  S/p liver transplant -Stable since 2018 transplant -He is on Cellcept and tacrolimus and so is immunosuppressed -These medications have been continued -Ongoing ETOH abstinence is essential -Due to f/u at [facility] but this has been delayed due to the [place] pandemic  CKD  -"stable creatinine" in 2/20 of 1.6 -Creatinine is currently 1.91 -Rehydrate as above and recheck in AM  HTN -Started on Coreg 12.5 mg BID in 2/20 -Will continue   Note: This patient has been tested and is negative for the novel coronavirus COVID-19.  DVT prophylaxis:   Lovenox  Code Status:  Full - confirmed with patient/family Family Communication: Mother was present throughout evaluation  Disposition Plan:  Home once clinically improved Consults called: None  Admission status: It is my clinical opinion that referral for OBSERVATION is reasonable and necessary in this patient based on the above information provided. The aforementioned taken together are felt to place the patient at high risk for further clinical deterioration. However it is anticipated that the patient may be medically stable for discharge from the hospital within 24 to 48 hours.    Karmen Bongo MD [facility]   How to contact the [facility] Attending or Consulting provider [place] or covering provider during after hours [place], for this patient?  1. Check the care team in [facility] and look for a) attending/consulting [facility] provider listed and b) the [facility] team listed 2. Log into [email or web address] and use 's universal password to access. If you  do not have the password, please contact the hospital operator. 3. Locate the [facility] provider you are looking for under [facility] and page to a number that you can be directly reached. 4. If you still have difficulty reaching the provider, please page the [facility] (Director on Call) for the Hospitalists listed on amion for assistance.   07/08/2019, 5:41 PM

## 2019-07-08 NOTE — ED Notes (Signed)
ED TO INPATIENT HANDOFF REPORT  ED Nurse Name and Phone #: [facility] [phone]   S Name/Age/Gender Thomas [name] 58 y.o. male Room/Bed: 019C/019C  Code Status   Code Status: Full Code  Home/SNF/Other Home Patient oriented to: self, place, time and situation Is this baseline? Yes   Triage Complete: Triage complete  Chief Complaint fall  Triage Note Pt reports a fall 1 week ago after he tripped over a dog, states that he has been generally weak and nauseated since. Does not think he had LOC with fall and was not seen by a medical provider. Hx of liver transplant. No blood thinners. Pt a/ox4, resp e/u, nad, speech clear.    Allergies Allergies  Allergen Reactions  . Xifaxan [Rifaximin] Swelling and Other (See Comments)    Reaction:  All over body swelling     Level of Care/Admitting Diagnosis ED Disposition    ED Disposition Condition Comment   Admit  Hospital Area: [facility] [[id number]]  Level of Care: Med-Surg [16]  I expect the patient will be discharged within 24 hours: No (not a candidate for 5C-Observation unit)  Covid Evaluation: Confirmed COVID Negative  Diagnosis: Dehydration with hyponatremia [644034]  Admitting Physician: Karmen Bongo [[id number]]  Attending Physician: Karmen Bongo [[id number]]  PT Class (Do Not Modify): Observation [104]  PT Acc Code (Do Not Modify): Observation [10022]       B Medical/Surgery History Past Medical History:  Diagnosis Date  . Alcohol abuse 2013  . Alcoholic cirrhosis ([place]) 06/4258  . Anemia 02/2016   in setting of GI blood loss, but MCV macrocytic.   . Coagulopathy ([place]) 05/2015  . Diverticulosis 09/2015  . 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 GERD (gastroesophageal reflux disease)   . Hyperlipidemia   . Hypertension   . Pneumonia   . Portal hypertension ([place]) 09/2015   with portal gastropathy  . Thrombocytopenia ([place]) 02/2016  . Tubular adenoma of colon 09/2015   Past Surgical History:  Procedure  Laterality Date  . ESOPHAGOGASTRODUODENOSCOPY N/A 03/05/2016   Procedure: ESOPHAGOGASTRODUODENOSCOPY (EGD);  Surgeon: Ladene Artist, MD;  Location: [name];  Service: Endoscopy;  Laterality: N/A;  . ESOPHAGOGASTRODUODENOSCOPY N/A 04/29/2017   Procedure: ESOPHAGOGASTRODUODENOSCOPY (EGD);  Surgeon: Milus Banister, MD;  Location: [name];  Service: Endoscopy;  Laterality: N/A;  . ESOPHAGOGASTRODUODENOSCOPY (EGD) WITH PROPOFOL N/A 03/29/2017   Procedure: ESOPHAGOGASTRODUODENOSCOPY (EGD) WITH PROPOFOL;  Surgeon: Doran Stabler, MD;  Location: [facility];  Service: Endoscopy;  Laterality: N/A;  . IR PARACENTESIS  05/15/2017  . IR PARACENTESIS  06/11/2017  . IR PARACENTESIS  06/26/2017  . IR PARACENTESIS  07/10/2017  . IR PARACENTESIS  08/16/2017  . IR PARACENTESIS  09/14/2017  . IR THORACENTESIS ASP PLEURAL SPACE W/IMG GUIDE  06/28/2017  . LIVER TRANSPLANT    . testicle prosthesis       A IV Location/Drains/Wounds Patient Lines/Drains/Airways Status   Active Line/Drains/Airways    Name:   Placement date:   Placement time:   Site:   Days:   Peripheral IV 07/08/19 Left;Anterior Forearm   07/08/19    1448    Forearm   less than 1   External Urinary Catheter   09/23/17    1604    -   653          Intake/Output Last 24 hours No intake or output data in the 24 hours ending 07/08/19 1758  Labs/Imaging Results for orders placed or performed during the hospital encounter  of 07/08/19 (from the past 48 hour(s))  Lipase, blood     Status: None   Collection Time: 07/08/19 10:06 AM  Result Value Ref Range   Lipase 20 11 - 51 U/L    Comment: Performed at [facility], [address] [place], [place] [address]  Comprehensive metabolic panel     Status: Abnormal   Collection Time: 07/08/19 10:06 AM  Result Value Ref Range   Sodium 126 (L) 135 - 145 mmol/L   Potassium 3.9 3.5 - 5.1 mmol/L   Chloride 89 (L) 98 - 111 mmol/L   CO2 20 (L) 22 - 32 mmol/L   Glucose, Bld 266 (H) 70 - 99  mg/dL   BUN 24 (H) 6 - 20 mg/dL   Creatinine, Ser 1.91 (H) 0.61 - 1.24 mg/dL   Calcium 8.8 (L) 8.9 - 10.3 mg/dL   Total Protein 7.0 6.5 - 8.1 g/dL   Albumin 3.6 3.5 - 5.0 g/dL   AST 31 15 - 41 U/L   ALT 18 0 - 44 U/L   Alkaline Phosphatase 67 38 - 126 U/L   Total Bilirubin 1.7 (H) 0.3 - 1.2 mg/dL   GFR calc non Af Amer 38 (L) >60 mL/min   GFR calc Af Amer 44 (L) >60 mL/min   Anion gap 17 (H) 5 - 15    Comment: Performed at [facility], [place] [address], [place], [place] [address]  CBC     Status: Abnormal   Collection Time: 07/08/19 10:06 AM  Result Value Ref Range   WBC 15.1 (H) 4.0 - 10.5 K/uL   RBC 3.83 (L) 4.22 - 5.81 MIL/uL   Hemoglobin 13.4 13.0 - 17.0 g/dL   HCT 38.7 (L) 39.0 - 52.0 %   MCV 101.0 (H) 80.0 - 100.0 fL   MCH 35.0 (H) 26.0 - 34.0 pg   MCHC 34.6 30.0 - 36.0 g/dL   RDW 12.6 11.5 - 15.5 %   Platelets 66 (L) 150 - 400 K/uL    Comment: REPEATED TO VERIFY PLATELET COUNT CONFIRMED BY SMEAR Immature Platelet Fraction may be clinically indicated, consider ordering this additional test [id number]    nRBC 0.0 0.0 - 0.2 %    Comment: Performed at [facility], [place] [address] [place], [place] [address]  Lactic acid, plasma     Status: None   Collection Time: 07/08/19 12:41 PM  Result Value Ref Range   Lactic Acid, Venous 1.9 0.5 - 1.9 mmol/L    Comment: Performed at [facility] [address], [place], [place] [address]  Osmolality     Status: None   Collection Time: 07/08/19 12:46 PM  Result Value Ref Range   Osmolality 275 275 - 295 mOsm/kg    Comment: Performed at [facility], [place] [address] [place], [place] [address]  Beta-hydroxybutyric acid     Status: Abnormal   Collection Time: 07/08/19  1:00 PM  Result Value Ref Range   Beta-Hydroxybutyric Acid 1.23 (H) 0.05 - 0.27 mmol/L    Comment: Performed at [facility] [address], [place], [place] [address]  SARS Coronavirus 2 (CEPHEID - Performed in [facility] hospital  lab), Hosp Order     Status: None   Collection Time: 07/08/19  1:20 PM   Specimen: Nasopharyngeal Swab  Result Value Ref Range   SARS Coronavirus 2 NEGATIVE NEGATIVE    Comment: (NOTE) If result is NEGATIVE SARS-CoV-2 target nucleic acids are  NOT DETECTED. The SARS-CoV-2 RNA is generally detectable in upper and lower  respiratory specimens during the acute phase of infection. The lowest  concentration of SARS-CoV-2 viral copies this assay can detect is 250  copies / mL. A negative result does not preclude SARS-CoV-2 infection  and should not be used as the sole basis for treatment or other  patient management decisions.  A negative result may occur with  improper specimen collection / handling, submission of specimen other  than nasopharyngeal swab, presence of viral mutation(s) within the  areas targeted by this assay, and inadequate number of viral copies  (<250 copies / mL). A negative result must be combined with clinical  observations, patient history, and epidemiological information. If result is POSITIVE SARS-CoV-2 target nucleic acids are DETECTED. The SARS-CoV-2 RNA is generally detectable in upper and lower  respiratory specimens dur ing the acute phase of infection.  Positive  results are indicative of active infection with SARS-CoV-2.  Clinical  correlation with patient history and other diagnostic information is  necessary to determine patient infection status.  Positive results do  not rule out bacterial infection or co-infection with other viruses. If result is PRESUMPTIVE POSTIVE SARS-CoV-2 nucleic acids MAY BE PRESENT.   A presumptive positive result was obtained on the submitted specimen  and confirmed on repeat testing.  While 2019 novel coronavirus  (SARS-CoV-2) nucleic acids may be present in the submitted sample  additional confirmatory testing may be necessary for epidemiological  and / or clinical management purposes  to differentiate between  SARS-CoV-2 and  other Sarbecovirus currently known to infect humans.  If clinically indicated additional testing with an alternate test  methodology [phone]) is advised. The SARS-CoV-2 RNA is generally  detectable in upper and lower respiratory sp ecimens during the acute  phase of infection. The expected result is Negative. Fact Sheet for Patients:  StrictlyIdeas.no Fact Sheet for Healthcare Providers: BankingDealers.co.za This test is not yet approved or cleared by the [place] FDA and has been authorized for detection and/or diagnosis of SARS-CoV-2 by FDA under an Emergency Use Authorization (EUA).  This EUA will remain in effect (meaning this test can be used) for the duration of the COVID-19 declaration under Section 564(b)(1) of the Act, 21 U.S.C. section 360bbb-3(b)(1), unless the authorization is terminated or revoked sooner. Performed at [facility], [place] [address] [place], [place] [address]   POCT I-Stat EG7     Status: Abnormal   Collection Time: 07/08/19  1:20 PM  Result Value Ref Range   pH, Ven 7.429 7.250 - 7.430   pCO2, Ven 37.1 (L) 44.0 - 60.0 mmHg   pO2, Ven 31.0 (LL) 32.0 - 45.0 mmHg   Bicarbonate 24.6 20.0 - 28.0 mmol/L   TCO2 26 22 - 32 mmol/L   O2 Saturation 63.0 %   Acid-Base Excess 1.0 0.0 - 2.0 mmol/L   Sodium 127 (L) 135 - 145 mmol/L   Potassium 3.7 3.5 - 5.1 mmol/L   Calcium, Ion 1.04 (L) 1.15 - 1.40 mmol/L   HCT 41.0 39.0 - 52.0 %   Hemoglobin 13.9 13.0 - 17.0 g/dL   Patient temperature HIDE    Sample type VENOUS    Comment NOTIFIED PHYSICIAN   Urinalysis, Routine w reflex microscopic     Status: Abnormal   Collection Time: 07/08/19  3:25 PM  Result Value Ref Range   Color, Urine AMBER (A) YELLOW    Comment: BIOCHEMICALS MAY BE AFFECTED BY COLOR   APPearance CLOUDY (  A) CLEAR   Specific Gravity, Urine 1.030 1.005 - 1.030   pH 5.0 5.0 - 8.0   Glucose, UA 150 (A) NEGATIVE mg/dL   Hgb urine dipstick  MODERATE (A) NEGATIVE   Bilirubin Urine NEGATIVE NEGATIVE   Ketones, ur 5 (A) NEGATIVE mg/dL   Protein, ur >=300 (A) NEGATIVE mg/dL   Nitrite NEGATIVE NEGATIVE   Leukocytes,Ua NEGATIVE NEGATIVE   RBC / HPF 0-5 0 - 5 RBC/hpf   WBC, UA 0-5 0 - 5 WBC/hpf   Bacteria, UA RARE (A) NONE SEEN   Squamous Epithelial / LPF 0-5 0 - 5   Mucus PRESENT    Hyaline Casts, UA PRESENT     Comment: Performed at [facility] [address], [place], [place] [address]  Sodium, urine, random     Status: None   Collection Time: 07/08/19  3:25 PM  Result Value Ref Range   Sodium, Ur <10 mmol/L    Comment: Performed at [facility] [address], [place], [place] [address]   Ct Head Wo Contrast  Result Date: 07/08/2019 CLINICAL DATA:  58 year old male status post fall 1 week ago with subsequent weakness6, nausea. EXAM: CT HEAD WITHOUT CONTRAST TECHNIQUE: Contiguous axial images were obtained from the base of the skull through the vertex without intravenous contrast. COMPARISON:  Head CT 04/27/2017. FINDINGS: Brain: Stable cerebral volume since 2018. No midline shift, ventriculomegaly, mass effect, evidence of mass lesion, intracranial hemorrhage or evidence of cortically based acute infarction. Gray-white matter differentiation is within normal limits throughout the brain. Vascular: Calcified atherosclerosis at the skull base. No suspicious intracranial vascular hyperdensity. Skull: No acute osseous abnormality identified. Sinuses/Orbits: Partial opacification of the posterior right ethmoids is new since 2018. Other paranasal sinuses and mastoids are stable and well pneumatized. Tympanic cavities are clear. Other: Small left forehead scalp hematoma and/or laceration on series 4, image 47. Otherwise negative orbit and scalp soft tissues. IMPRESSION: 1. Left forehead scalp soft tissue injury without underlying skull fracture. 2. Stable and negative for age non contrast CT appearance of the brain.  Electronically Signed   By: Genevie Ann M.D.   On: 07/08/2019 14:03   Dg Chest Portable 1 View  Result Date: 07/08/2019 CLINICAL DATA:  58 year old male status post fall 1 week ago with weakness, nausea, cough. EXAM: PORTABLE CHEST 1 VIEW COMPARISON:  09/23/2017 portable chest and earlier. FINDINGS: Portable AP semi upright view at 1327 hours. Chronic elevation of the right hemidiaphragm, but possible superimposed small right pleural effusion. No right rib fracture identified. No pneumothorax identified. Cardiac and mediastinal contours are stable and within normal limits. Visualized tracheal air column is within normal limits. Allowing for portable technique the left lung is clear. The right upper lung is clear. Negative visible bowel gas pattern. Surgical clips in the upper abdomen. IMPRESSION: 1. Possible small right pleural effusion superimposed on chronic elevation of the right hemidiaphragm. 2. No other acute cardiopulmonary abnormality. Electronically Signed   By: Genevie Ann M.D.   On: 07/08/2019 13:58    Pending Labs Unresulted Labs (From admission, onward)    Start     Ordered   07/09/19 2956  Basic metabolic panel  Tomorrow morning,   R     07/08/19 1727   07/09/19 0500  CBC  Tomorrow morning,   R     07/08/19 1727   07/09/19 0500  Urinalysis, Routine w reflex microscopic  Tomorrow morning,   STAT     07/08/19 1727  07/08/19 1746  Hemoglobin A1c  Once,   STAT     07/08/19 1745   07/08/19 1725  HIV antibody (Routine Testing)  Once,   STAT     07/08/19 1727          Vitals/Pain Today's Vitals   07/08/19 1700 07/08/19 1703 07/08/19 1715 07/08/19 1730  BP: (!) 151/89  (!) 145/94 (!) 131/93  Pulse: 90  92 94  Resp: (!) 23  (!) 23 20  Temp:      TempSrc:      SpO2: 99%  100% 99%  PainSc:  0-No pain      Isolation Precautions No active isolations  Medications Medications  aspirin chewable tablet 81 mg (has no administration in time range)  carvedilol (COREG) tablet 12.5 mg  (has no administration in time range)  mycophenolate (CELLCEPT) capsule 500 mg (has no administration in time range)  tacrolimus (PROGRAF) capsule 1 mg (has no administration in time range)  enoxaparin (LOVENOX) injection 40 mg (has no administration in time range)  acetaminophen (TYLENOL) tablet 650 mg (has no administration in time range)    Or  acetaminophen (TYLENOL) suppository 650 mg (has no administration in time range)  docusate sodium (COLACE) capsule 100 mg (has no administration in time range)  ondansetron (ZOFRAN) tablet 4 mg (has no administration in time range)    Or  ondansetron (ZOFRAN) injection 4 mg (has no administration in time range)  insulin aspart (novoLOG) injection 0-15 Units (has no administration in time range)  insulin aspart (novoLOG) injection 0-5 Units (has no administration in time range)  0.9 %  sodium chloride infusion (has no administration in time range)  ondansetron (ZOFRAN-ODT) disintegrating tablet 4 mg (4 mg Oral Given 07/08/19 1449)  sodium chloride 0.9 % bolus 500 mL (0 mLs Intravenous Stopped 07/08/19 1527)    Mobility walks with device Moderate fall risk   Focused Assessments Cardiac Assessment Handoff:  Cardiac Rhythm: Sinus tachycardia Lab Results  Component Value Date   TROPONINI <0.03 03/27/2017   No results found for: DDIMER Does the Patient currently have chest pain? No     R Recommendations: See Admitting Provider Note  Report given to:   Additional Notes:

## 2019-07-09 DIAGNOSIS — Z794 Long term (current) use of insulin: Secondary | ICD-10-CM | POA: Diagnosis not present

## 2019-07-09 DIAGNOSIS — I129 Hypertensive chronic kidney disease with stage 1 through stage 4 chronic kidney disease, or unspecified chronic kidney disease: Secondary | ICD-10-CM | POA: Diagnosis present

## 2019-07-09 DIAGNOSIS — Y93K1 Activity, walking an animal: Secondary | ICD-10-CM | POA: Diagnosis not present

## 2019-07-09 DIAGNOSIS — Z820 Family history of epilepsy and other diseases of the nervous system: Secondary | ICD-10-CM | POA: Diagnosis not present

## 2019-07-09 DIAGNOSIS — N179 Acute kidney failure, unspecified: Secondary | ICD-10-CM | POA: Diagnosis present

## 2019-07-09 DIAGNOSIS — Z944 Liver transplant status: Secondary | ICD-10-CM | POA: Diagnosis not present

## 2019-07-09 DIAGNOSIS — T380X5A Adverse effect of glucocorticoids and synthetic analogues, initial encounter: Secondary | ICD-10-CM | POA: Diagnosis present

## 2019-07-09 DIAGNOSIS — Z7982 Long term (current) use of aspirin: Secondary | ICD-10-CM | POA: Diagnosis not present

## 2019-07-09 DIAGNOSIS — E871 Hypo-osmolality and hyponatremia: Secondary | ICD-10-CM | POA: Diagnosis present

## 2019-07-09 DIAGNOSIS — E1122 Type 2 diabetes mellitus with diabetic chronic kidney disease: Secondary | ICD-10-CM | POA: Diagnosis present

## 2019-07-09 DIAGNOSIS — E860 Dehydration: Secondary | ICD-10-CM | POA: Diagnosis present

## 2019-07-09 DIAGNOSIS — Z20828 Contact with and (suspected) exposure to other viral communicable diseases: Secondary | ICD-10-CM | POA: Diagnosis present

## 2019-07-09 DIAGNOSIS — S8012XA Contusion of left lower leg, initial encounter: Secondary | ICD-10-CM | POA: Diagnosis present

## 2019-07-09 DIAGNOSIS — Z888 Allergy status to other drugs, medicaments and biological substances status: Secondary | ICD-10-CM | POA: Diagnosis not present

## 2019-07-09 DIAGNOSIS — S0181XA Laceration without foreign body of other part of head, initial encounter: Secondary | ICD-10-CM | POA: Diagnosis present

## 2019-07-09 DIAGNOSIS — E1165 Type 2 diabetes mellitus with hyperglycemia: Secondary | ICD-10-CM | POA: Diagnosis present

## 2019-07-09 DIAGNOSIS — E1143 Type 2 diabetes mellitus with diabetic autonomic (poly)neuropathy: Secondary | ICD-10-CM | POA: Diagnosis present

## 2019-07-09 DIAGNOSIS — K219 Gastro-esophageal reflux disease without esophagitis: Secondary | ICD-10-CM | POA: Diagnosis present

## 2019-07-09 DIAGNOSIS — L03116 Cellulitis of left lower limb: Secondary | ICD-10-CM | POA: Diagnosis present

## 2019-07-09 DIAGNOSIS — Z79899 Other long term (current) drug therapy: Secondary | ICD-10-CM | POA: Diagnosis not present

## 2019-07-09 DIAGNOSIS — K3184 Gastroparesis: Secondary | ICD-10-CM | POA: Diagnosis present

## 2019-07-09 DIAGNOSIS — E785 Hyperlipidemia, unspecified: Secondary | ICD-10-CM | POA: Diagnosis present

## 2019-07-09 DIAGNOSIS — E876 Hypokalemia: Secondary | ICD-10-CM | POA: Diagnosis present

## 2019-07-09 DIAGNOSIS — Z833 Family history of diabetes mellitus: Secondary | ICD-10-CM | POA: Diagnosis not present

## 2019-07-09 DIAGNOSIS — W19XXXA Unspecified fall, initial encounter: Secondary | ICD-10-CM | POA: Diagnosis present

## 2019-07-09 DIAGNOSIS — Z83511 Family history of glaucoma: Secondary | ICD-10-CM | POA: Diagnosis not present

## 2019-07-09 LAB — BASIC METABOLIC PANEL
Anion gap: 13 (ref 5–15)
BUN: 24 mg/dL — ABNORMAL HIGH (ref 6–20)
CO2: 22 mmol/L (ref 22–32)
Calcium: 7.7 mg/dL — ABNORMAL LOW (ref 8.9–10.3)
Chloride: 95 mmol/L — ABNORMAL LOW (ref 98–111)
Creatinine, Ser: 1.62 mg/dL — ABNORMAL HIGH (ref 0.61–1.24)
GFR calc Af Amer: 54 mL/min — ABNORMAL LOW (ref >60)
GFR calc non Af Amer: 46 mL/min — ABNORMAL LOW (ref >60)
Glucose, Bld: 101 mg/dL — ABNORMAL HIGH (ref 70–99)
Potassium: 3.5 mmol/L (ref 3.5–5.1)
Sodium: 130 mmol/L — ABNORMAL LOW (ref 135–145)

## 2019-07-09 LAB — URINALYSIS, ROUTINE W REFLEX MICROSCOPIC
Bacteria, UA: NONE SEEN
Bilirubin Urine: NEGATIVE
Glucose, UA: NEGATIVE mg/dL
Ketones, ur: 5 mg/dL — AB
Leukocytes,Ua: NEGATIVE
Nitrite: NEGATIVE
Protein, ur: 100 mg/dL — AB
Specific Gravity, Urine: 1.024 (ref 1.005–1.030)
pH: 5 (ref 5.0–8.0)

## 2019-07-09 LAB — CBC
HCT: 31.1 % — ABNORMAL LOW (ref 39.0–52.0)
Hemoglobin: 10.7 g/dL — ABNORMAL LOW (ref 13.0–17.0)
MCH: 34.5 pg — ABNORMAL HIGH (ref 26.0–34.0)
MCHC: 34.4 g/dL (ref 30.0–36.0)
MCV: 100.3 fL — ABNORMAL HIGH (ref 80.0–100.0)
Platelets: 57 10*3/uL — ABNORMAL LOW (ref 150–400)
RBC: 3.1 MIL/uL — ABNORMAL LOW (ref 4.22–5.81)
RDW: 12.7 % (ref 11.5–15.5)
WBC: 9.3 10*3/uL (ref 4.0–10.5)
nRBC: 0 % (ref 0.0–0.2)

## 2019-07-09 LAB — GLUCOSE, CAPILLARY
Glucose-Capillary: 108 mg/dL — ABNORMAL HIGH (ref 70–99)
Glucose-Capillary: 135 mg/dL — ABNORMAL HIGH (ref 70–99)
Glucose-Capillary: 97 mg/dL (ref 70–99)
Glucose-Capillary: 117 mg/dL — ABNORMAL HIGH (ref 70–99)

## 2019-07-09 LAB — HIV ANTIBODY (ROUTINE TESTING W REFLEX): HIV Screen 4th Generation wRfx: NONREACTIVE

## 2019-07-09 NOTE — Progress Notes (Signed)
Progress Note    Thomas [name]  ZOX:[id number] DOB: 14-Apr-1961  DOA: 07/08/2019 PCP: Alroy Dust, L.Marlou Sa, MD    Brief Narrative:     Medical records reviewed and are as summarized below:  Thomas [name] is an 58 y.o. 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  He reports nausea problems and also was "drug down by the dog."  He was walking the dog and it dragged him down.  He has been nausea and vomiting spells.  Every 3-4 weeks, he seems to have a couple of days where he has n/v and then it subsides for a while.  His last episode was last weekend.  They went to [facility] and they suggested he come to the ER for a CT scan.   Assessment/Plan:   Principal Problem:   Dehydration with hyponatremia Active Problems:   Acute kidney injury superimposed on CKD (HCC)   Diabetes mellitus type 2 in nonobese [facility])   Essential hypertension   Diabetic gastroparesis (HCC)   H/O liver transplant ([facility])  Hyponatremia -Etiology appears to be hypovolemic hyponatremia; patient with episodic n/v which occurred again over the weekend and ongoing decreased PO intake.   -His only significant symptom appears to be weakness, and this may improve with rehydration and correction of the hyponatremia -Serum osmolality is low-normal -Urine sodium was quite low, which is consistent with dehydration. -He did have marked proteinuria, but this improved with hydration -IVF continued until taking in PO consistantly  Probable gastroparesis -Patient appears to have episodic n/v which would be c/w gastroparesis -A gastric emptying study could be considered as an outpatient -Trial of Reglan or erythromycin could be considered, as well -For now, however, given acute illness, would suggest no additional medications and monitoring since his symptoms have currently  improved  DM -This was previously attributed to steroid-induced hyperglycemia and so has been covered with SSI without PO medication - A1c: 5.2  S/p liver transplant -Stable since 2018 transplant -He is on Cellcept and tacrolimus and so is immunosuppressed -These medications have been continued -Ongoing ETOH abstinence is essential -Due to f/u at [facility] but this has been delayed due to the COVID pandemic  CKD  -"stable creatinine" in 2/20 of 1.6 -Creatinine at admission was 1.91 -trending down  HTN -Started on Coreg 12.5 mg BID in 2/20 -Will continue   Family Communication/Anticipated D/C date and plan/Code Status   DVT prophylaxis: scd Code Status: Full Code.  Family Communication:  Disposition Plan: home 24-48 hours   Medical Consultants:    None.    Subjective:   Not able to eat much food this AM  Objective:    Vitals:   07/08/19 2131 07/09/19 0540 07/09/19 0800 07/09/19 1211  BP: (!) 143/86 (!) 144/80 (!) 151/80 139/85  Pulse: (!) 103 (!) 102 93 94  Resp: 18 16 16 20   Temp: 99 F (37.2 C) 99.5 F (37.5 C) 99.7 F (37.6 C) 99.3 F (37.4 C)  TempSrc: Oral  Oral Oral  SpO2: 99% 96% 100% 99%  Weight:      Height:        Intake/Output Summary (Last 24 hours) at 07/09/2019 1330 Last data filed at 07/09/2019 1200 Gross per 24 hour  Intake 1402.58 ml  Output 377 ml  Net 1025.58 ml   Filed Weights   07/08/19 1852  Weight: 72.9 kg    Exam:  In bed, comfortable appearing rrr No wheezing +BS, soft, NT No LE edema  Data Reviewed:   I have personally reviewed following labs and imaging studies:  Labs: Labs show the following:   Basic Metabolic Panel: Recent Labs  Lab 07/08/19 1006 07/08/19 1320 07/09/19 0303  NA 126* 127* 130*  K 3.9 3.7 3.5  CL 89*  --  95*  CO2 20*  --  22  GLUCOSE 266*  --  101*  BUN 24*  --  24*  CREATININE 1.91*  --  1.62*  CALCIUM 8.8*  --  7.7*   GFR Estimated Creatinine Clearance: 46 mL/min (A) (by  C-G formula based on SCr of 1.62 mg/dL (H)). Liver Function Tests: Recent Labs  Lab 07/08/19 1006  AST 31  ALT 18  ALKPHOS 67  BILITOT 1.7*  PROT 7.0  ALBUMIN 3.6   Recent Labs  Lab 07/08/19 1006  LIPASE 20   No results for input(s): AMMONIA in the last 168 hours. Coagulation profile No results for input(s): INR, PROTIME in the last 168 hours.  CBC: Recent Labs  Lab 07/08/19 1006 07/08/19 1320 07/09/19 0303  WBC 15.1*  --  9.3  HGB 13.4 13.9 10.7*  HCT 38.7* 41.0 31.1*  MCV 101.0*  --  100.3*  PLT 66*  --  57*   Cardiac Enzymes: No results for input(s): CKTOTAL, CKMB, CKMBINDEX, TROPONINI in the last 168 hours. BNP (last 3 results) No results for input(s): PROBNP in the last 8760 hours. CBG: Recent Labs  Lab 07/08/19 2128 07/09/19 0810 07/09/19 1212  GLUCAP 138* 108* 135*   D-Dimer: No results for input(s): DDIMER in the last 72 hours. Hgb A1c: Recent Labs    07/08/19 1911  HGBA1C 5.2   Lipid Profile: No results for input(s): CHOL, HDL, LDLCALC, TRIG, CHOLHDL, LDLDIRECT in the last 72 hours. Thyroid function studies: No results for input(s): TSH, T4TOTAL, T3FREE, THYROIDAB in the last 72 hours.  Invalid input(s): FREET3 Anemia work up: No results for input(s): VITAMINB12, FOLATE, FERRITIN, TIBC, IRON, RETICCTPCT in the last 72 hours. Sepsis Labs: Recent Labs  Lab 07/08/19 1006 07/08/19 1241 07/09/19 0303  WBC 15.1*  --  9.3  LATICACIDVEN  --  1.9  --     Microbiology Recent Results (from the past 240 hour(s))  SARS Coronavirus 2 (CEPHEID - Performed in [place] hospital lab), Hosp Order     Status: None   Collection Time: 07/08/19  1:20 PM   Specimen: Nasopharyngeal Swab  Result Value Ref Range Status   SARS Coronavirus 2 NEGATIVE NEGATIVE Final    Comment: (NOTE) If result is NEGATIVE SARS-CoV-2 target nucleic acids are NOT DETECTED. The SARS-CoV-2 RNA is generally detectable in upper and lower  respiratory specimens during the  acute phase of infection. The lowest  concentration of SARS-CoV-2 viral copies this assay can detect is 250  copies / mL. A negative result does not preclude SARS-CoV-2 infection  and should not be used as the sole basis for treatment or other  patient management decisions.  A negative result may occur with  improper specimen collection / handling, submission of specimen other  than nasopharyngeal swab, presence of viral mutation(s) within the  areas targeted by this assay, and inadequate number of viral copies  (<250 copies / mL). A negative result must be combined with clinical  observations, patient history, and epidemiological information. If result is POSITIVE SARS-CoV-2 target nucleic acids are DETECTED. The SARS-CoV-2 RNA is generally detectable in upper and lower  respiratory specimens dur ing the acute phase of infection.  Positive  results are indicative of active infection with SARS-CoV-2.  Clinical  correlation with patient history and other diagnostic information is  necessary to determine patient infection status.  Positive results do  not rule out bacterial infection or co-infection with other viruses. If result is PRESUMPTIVE POSTIVE SARS-CoV-2 nucleic acids MAY BE PRESENT.   A presumptive positive result was obtained on the submitted specimen  and confirmed on repeat testing.  While 2019 novel coronavirus  (SARS-CoV-2) nucleic acids may be present in the submitted sample  additional confirmatory testing may be necessary for epidemiological  and / or clinical management purposes  to differentiate between  SARS-CoV-2 and other Sarbecovirus currently known to infect humans.  If clinically indicated additional testing with an alternate test  methodology [phone]) is advised. The SARS-CoV-2 RNA is generally  detectable in upper and lower respiratory sp ecimens during the acute  phase of infection. The expected result is Negative. Fact Sheet for Patients:   StrictlyIdeas.no Fact Sheet for Healthcare Providers: BankingDealers.co.za This test is not yet approved or cleared by the [place] FDA and has been authorized for detection and/or diagnosis of SARS-CoV-2 by FDA under an Emergency Use Authorization (EUA).  This EUA will remain in effect (meaning this test can be used) for the duration of the COVID-19 declaration under Section 564(b)(1) of the Act, 21 U.S.C. section 360bbb-3(b)(1), unless the authorization is terminated or revoked sooner. Performed at [facility], [place] [address], [place], [place] [address]     Procedures and diagnostic studies:  Ct Head Wo Contrast  Result Date: 07/08/2019 CLINICAL DATA:  58 year old male status post fall 1 week ago with subsequent weakness6, nausea. EXAM: CT HEAD WITHOUT CONTRAST TECHNIQUE: Contiguous axial images were obtained from the base of the skull through the vertex without intravenous contrast. COMPARISON:  Head CT 04/27/2017. FINDINGS: Brain: Stable cerebral volume since 2018. No midline shift, ventriculomegaly, mass effect, evidence of mass lesion, intracranial hemorrhage or evidence of cortically based acute infarction. Gray-white matter differentiation is within normal limits throughout the brain. Vascular: Calcified atherosclerosis at the skull base. No suspicious intracranial vascular hyperdensity. Skull: No acute osseous abnormality identified. Sinuses/Orbits: Partial opacification of the posterior right ethmoids is new since 2018. Other paranasal sinuses and mastoids are stable and well pneumatized. Tympanic cavities are clear. Other: Small left forehead scalp hematoma and/or laceration on series 4, image 47. Otherwise negative orbit and scalp soft tissues. IMPRESSION: 1. Left forehead scalp soft tissue injury without underlying skull fracture. 2. Stable and negative for age non contrast CT appearance of the brain. Electronically Signed    By: Genevie Ann M.D.   On: 07/08/2019 14:03   Dg Chest Portable 1 View  Result Date: 07/08/2019 CLINICAL DATA:  59 year old male status post fall 1 week ago with weakness, nausea, cough. EXAM: PORTABLE CHEST 1 VIEW COMPARISON:  09/23/2017 portable chest and earlier. FINDINGS: Portable AP semi upright view at 1327 hours. Chronic elevation of the right hemidiaphragm, but possible superimposed small right pleural effusion. No right rib fracture identified. No pneumothorax identified. Cardiac and mediastinal contours are stable and within normal limits. Visualized tracheal air column is within normal limits. Allowing for portable technique the left lung is clear. The right upper lung is clear. Negative visible bowel gas pattern. Surgical clips in the upper abdomen. IMPRESSION: 1. Possible small right pleural effusion superimposed on chronic elevation of the right hemidiaphragm. 2. No other acute cardiopulmonary abnormality. Electronically Signed  By: Genevie Ann M.D.   On: 07/08/2019 13:58    Medications:    aspirin  81 mg Oral Daily   carvedilol  12.5 mg Oral BID   docusate sodium  100 mg Oral BID   insulin aspart  0-15 Units Subcutaneous TID WC   insulin aspart  0-5 Units Subcutaneous QHS   mycophenolate  500 mg Oral Q12H   tacrolimus  1 mg Oral Q12H   Continuous Infusions:  sodium chloride 100 mL/hr at 07/09/19 0423     LOS: 0 days   Geradine Girt  [facility]   How to contact the [facility] Attending or Consulting provider [place] or covering provider during after hours [place], for this patient?  1. Check the care team in [facility] and look for a) attending/consulting [facility] provider listed and b) the [facility] team listed 2. Log into [email or web address] and use [place]'s universal password to access. If you do not have the password, please contact the hospital operator. 3. Locate the [facility] provider you are looking for under [facility] and page to a number that you can be directly  reached. 4. If you still have difficulty reaching the provider, please page the [facility] (Director on Call) for the Hospitalists listed on amion for assistance.  07/09/2019, 1:30 PM

## 2019-07-09 NOTE — Progress Notes (Signed)
Patient complaints of left leg shin pain, upon assessment pt leg is warm and some redness noted with plus one edema, pulses are palpable bilaterally , MD. Eliseo Squires page and notify of pt complaints. Will continue to monitor.

## 2019-07-09 NOTE — Plan of Care (Signed)
  Problem: Education: Goal: Knowledge of General Education information will improve Description: Including pain rating scale, medication(s)/side effects and non-pharmacologic comfort measures Outcome: Progressing   Problem: Clinical Measurements: Goal: Ability to maintain clinical measurements within normal limits will improve Outcome: Progressing Goal: Diagnostic test results will improve Outcome: Progressing   Problem: Coping: Goal: Level of anxiety will decrease Outcome: Progressing   Problem: Pain Managment: Goal: General experience of comfort will improve Outcome: Progressing   Problem: Safety: Goal: Ability to remain free from injury will improve Outcome: Progressing

## 2019-07-10 ENCOUNTER — Inpatient Hospital Stay (HOSPITAL_COMMUNITY): Payer: BLUE CROSS/BLUE SHIELD

## 2019-07-10 DIAGNOSIS — M79609 Pain in unspecified limb: Secondary | ICD-10-CM

## 2019-07-10 DIAGNOSIS — M7989 Other specified soft tissue disorders: Secondary | ICD-10-CM

## 2019-07-10 LAB — CBC
HCT: 28.4 % — ABNORMAL LOW (ref 39.0–52.0)
Hemoglobin: 9.7 g/dL — ABNORMAL LOW (ref 13.0–17.0)
MCH: 35.4 pg — ABNORMAL HIGH (ref 26.0–34.0)
MCHC: 34.2 g/dL (ref 30.0–36.0)
MCV: 103.6 fL — ABNORMAL HIGH (ref 80.0–100.0)
Platelets: 73 10*3/uL — ABNORMAL LOW (ref 150–400)
RBC: 2.74 MIL/uL — ABNORMAL LOW (ref 4.22–5.81)
RDW: 12.8 % (ref 11.5–15.5)
WBC: 8.6 10*3/uL (ref 4.0–10.5)
nRBC: 0 % (ref 0.0–0.2)

## 2019-07-10 LAB — GLUCOSE, CAPILLARY
Glucose-Capillary: 92 mg/dL (ref 70–99)
Glucose-Capillary: 126 mg/dL — ABNORMAL HIGH (ref 70–99)
Glucose-Capillary: 108 mg/dL — ABNORMAL HIGH (ref 70–99)
Glucose-Capillary: 90 mg/dL (ref 70–99)
Glucose-Capillary: 110 mg/dL — ABNORMAL HIGH (ref 70–99)

## 2019-07-10 LAB — BASIC METABOLIC PANEL
Anion gap: 9 (ref 5–15)
BUN: 14 mg/dL (ref 6–20)
CO2: 24 mmol/L (ref 22–32)
Calcium: 7.5 mg/dL — ABNORMAL LOW (ref 8.9–10.3)
Chloride: 100 mmol/L (ref 98–111)
Creatinine, Ser: 1.11 mg/dL (ref 0.61–1.24)
GFR calc Af Amer: >60 mL/min (ref >60)
GFR calc non Af Amer: >60 mL/min (ref >60)
Glucose, Bld: 100 mg/dL — ABNORMAL HIGH (ref 70–99)
Potassium: 3.8 mmol/L (ref 3.5–5.1)
Sodium: 133 mmol/L — ABNORMAL LOW (ref 135–145)

## 2019-07-10 MED ORDER — SODIUM CHLORIDE 0.9 % IV SOLN
1.0000 g | INTRAVENOUS | Status: DC
  Administered 2019-07-10 – 2019-07-12 (×3): 1 g via INTRAVENOUS
  Filled 2019-07-10 (×3): qty 10

## 2019-07-10 NOTE — Progress Notes (Signed)
Left lower extremity venous duplex completed. Refer to "CV Proc" under chart review to view preliminary results.  07/10/2019 10:16 AM Maudry Mayhew, MHA, RVT, RDCS, RDMS

## 2019-07-10 NOTE — Progress Notes (Signed)
Progress Note    Thomas [name]  ZOX:[id number] DOB: 1960-12-18  DOA: 07/08/2019 PCP: Alroy Dust, L.Marlou Sa, MD    Brief Narrative:     Medical records reviewed and are as summarized below:  Thomas [name] is an 58 y.o. 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  He reports nausea problems and also was "drug down by the dog."  He was walking the dog and it dragged him down.  He has been nausea and vomiting spells.  Every 3-4 weeks, he seems to have a couple of days where he has n/v and then it subsides for a while.  His last episode was last weekend.  They went to [facility] and they suggested he come to the ER for a CT scan.   Assessment/Plan:   Principal Problem:   Dehydration with hyponatremia Active Problems:   Acute kidney injury superimposed on CKD (HCC)   Diabetes mellitus type 2 in nonobese [facility])   Essential hypertension   Diabetic gastroparesis (HCC)   H/O liver transplant ([place])  Hyponatremia -Etiology appears to be hypovolemic hyponatremia; patient with episodic n/v which occurred again over the weekend and ongoing decreased PO intake.   -His only significant symptom appears to be weakness, and this may improve with rehydration and correction of the hyponatremia -Serum osmolality is low-normal -Urine sodium was quite low, which is consistent with dehydration. -He did have marked proteinuria, but this improved with hydration -IVF continued until taking in PO consistantly  Left LE cellulitis -elevation -rocephin IV -[place]- poor study but no dvt seen  Probable gastroparesis -Patient appears to have episodic n/v which would be c/w gastroparesis -A gastric emptying study could be considered as an outpatient -Trial of Reglan or erythromycin could be considered, as well -For now, however, given acute illness, would suggest  no additional medications and monitoring since his symptoms have currently improved  DM -This was previously attributed to steroid-induced hyperglycemia and so has been covered with SSI without PO medication - A1c: 5.2  S/p liver transplant -Stable since 2018 transplant -He is on Cellcept and tacrolimus and so is immunosuppressed -These medications have been continued -Ongoing ETOH abstinence is essential -Due to f/u at [facility] but this has been delayed due to the COVID pandemic  CKD  -"stable creatinine" in 2/20 of 1.6 -Creatinine at admission was 1.91 -trending down  HTN -Started on Coreg 12.5 mg BID in 2/20 -Will continue   Family Communication/Anticipated D/C date and plan/Code Status   DVT prophylaxis: scd Code Status: Full Code.  Family Communication:  Disposition Plan: home 24-48 hours   Medical Consultants:    None.    Subjective:   Redness in left leg last PM Tolerating food better  Objective:    Vitals:   07/09/19 2130 07/10/19 0516 07/10/19 1150 07/10/19 1150  BP: (!) 159/96 (!) 158/83  (!) 143/91  Pulse: 90 97  85  Resp: 16   19  Temp: 98.6 F (37 C) 98.8 F (37.1 C) 98.8 F (37.1 C)   TempSrc: Oral  Oral   SpO2: 100% 100%  97%  Weight:      Height:        Intake/Output Summary (Last 24 hours) at 07/10/2019 1151 Last data filed at 07/10/2019 1145 Gross per 24 hour  Intake 3350.92 ml  Output 1645 ml  Net 1705.92 ml   [employer]  07/08/19 1852  Weight: 72.9 kg    Exam: In  Bed, nad Left leg swollen from knee to foot, bruising on ankle, +edema and erythema A+Ox3  Data Reviewed:   I have personally reviewed following labs and imaging studies:  Labs: Labs show the following:   Basic Metabolic Panel: Recent Labs  Lab 07/08/19 1006 07/08/19 1320 07/09/19 0303 07/10/19 0245  NA 126* 127* 130* 133*  K 3.9 3.7 3.5 3.8  CL 89*  --  95* 100  CO2 20*  --  22 24  GLUCOSE 266*  --  101* 100*  BUN 24*  --  24* 14    CREATININE 1.91*  --  1.62* 1.11  CALCIUM 8.8*  --  7.7* 7.5*   GFR Estimated Creatinine Clearance: 67.2 mL/min (by C-G formula based on SCr of 1.11 mg/dL). Liver Function Tests: Recent Labs  Lab 07/08/19 1006  AST 31  ALT 18  ALKPHOS 67  BILITOT 1.7*  PROT 7.0  ALBUMIN 3.6   Recent Labs  Lab 07/08/19 1006  LIPASE 20   No results for input(s): AMMONIA in the last 168 hours. Coagulation profile No results for input(s): INR, PROTIME in the last 168 hours.  CBC: Recent Labs  Lab 07/08/19 1006 07/08/19 1320 07/09/19 0303 07/10/19 0245  WBC 15.1*  --  9.3 8.6  HGB 13.4 13.9 10.7* 9.7*  HCT 38.7* 41.0 31.1* 28.4*  MCV 101.0*  --  100.3* 103.6*  PLT 66*  --  57* 73*   Cardiac Enzymes: No results for input(s): CKTOTAL, CKMB, CKMBINDEX, TROPONINI in the last 168 hours. BNP (last 3 results) No results for input(s): PROBNP in the last 8760 hours. CBG: Recent Labs  Lab 07/09/19 0810 07/09/19 1212 07/09/19 1703 07/09/19 2134 07/10/19 0750  GLUCAP 108* 135* 97 117* 92   D-Dimer: No results for input(s): DDIMER in the last 72 hours. Hgb A1c: Recent Labs    07/08/19 1911  HGBA1C 5.2   Lipid Profile: No results for input(s): CHOL, HDL, LDLCALC, TRIG, CHOLHDL, LDLDIRECT in the last 72 hours. Thyroid function studies: No results for input(s): TSH, T4TOTAL, T3FREE, THYROIDAB in the last 72 hours.  Invalid input(s): FREET3 Anemia work up: No results for input(s): VITAMINB12, FOLATE, FERRITIN, TIBC, IRON, RETICCTPCT in the last 72 hours. Sepsis Labs: Recent Labs  Lab 07/08/19 1006 07/08/19 1241 07/09/19 0303 07/10/19 0245  WBC 15.1*  --  9.3 8.6  LATICACIDVEN  --  1.9  --   --     Microbiology Recent Results (from the past 240 hour(s))  SARS Coronavirus 2 (CEPHEID - Performed in [place] hospital lab), Hosp Order     Status: None   Collection Time: 07/08/19  1:20 PM   Specimen: Nasopharyngeal Swab  Result Value Ref Range Status   SARS Coronavirus 2  NEGATIVE NEGATIVE Final    Comment: (NOTE) If result is NEGATIVE SARS-CoV-2 target nucleic acids are NOT DETECTED. The SARS-CoV-2 RNA is generally detectable in upper and lower  respiratory specimens during the acute phase of infection. The lowest  concentration of SARS-CoV-2 viral copies this assay can detect is 250  copies / mL. A negative result does not preclude SARS-CoV-2 infection  and should not be used as the sole basis for treatment or other  patient management decisions.  A negative result may occur with  improper specimen collection / handling, submission of specimen other  than nasopharyngeal swab, presence of viral mutation(s) within the  areas targeted by this assay, and inadequate number  of viral copies  (<250 copies / mL). A negative result must be combined with clinical  observations, patient history, and epidemiological information. If result is POSITIVE SARS-CoV-2 target nucleic acids are DETECTED. The SARS-CoV-2 RNA is generally detectable in upper and lower  respiratory specimens dur ing the acute phase of infection.  Positive  results are indicative of active infection with SARS-CoV-2.  Clinical  correlation with patient history and other diagnostic information is  necessary to determine patient infection status.  Positive results do  not rule out bacterial infection or co-infection with other viruses. If result is PRESUMPTIVE POSTIVE SARS-CoV-2 nucleic acids MAY BE PRESENT.   A presumptive positive result was obtained on the submitted specimen  and confirmed on repeat testing.  While 2019 novel coronavirus  (SARS-CoV-2) nucleic acids may be present in the submitted sample  additional confirmatory testing may be necessary for epidemiological  and / or clinical management purposes  to differentiate between  SARS-CoV-2 and other Sarbecovirus currently known to infect humans.  If clinically indicated additional testing with an alternate test  methodology [id number])  is advised. The SARS-CoV-2 RNA is generally  detectable in upper and lower respiratory sp ecimens during the acute  phase of infection. The expected result is Negative. Fact Sheet for Patients:  StrictlyIdeas.no Fact Sheet for Healthcare Providers: BankingDealers.co.za This test is not yet approved or cleared by the [place] FDA and has been authorized for detection and/or diagnosis of SARS-CoV-2 by FDA under an Emergency Use Authorization (EUA).  This EUA will remain in effect (meaning this test can be used) for the duration of the COVID-19 declaration under Section 564(b)(1) of the Act, 21 U.S.C. section 360bbb-3(b)(1), unless the authorization is terminated or revoked sooner. Performed at [facility], [place] [address], [place], [place] [address]     Procedures and diagnostic studies:  Ct Head Wo Contrast  Result Date: 07/08/2019 CLINICAL DATA:  58 year old male status post fall 1 week ago with subsequent weakness6, nausea. EXAM: CT HEAD WITHOUT CONTRAST TECHNIQUE: Contiguous axial images were obtained from the base of the skull through the vertex without intravenous contrast. COMPARISON:  Head CT 04/27/2017. FINDINGS: Brain: Stable cerebral volume since 2018. No midline shift, ventriculomegaly, mass effect, evidence of mass lesion, intracranial hemorrhage or evidence of cortically based acute infarction. Gray-white matter differentiation is within normal limits throughout the brain. Vascular: Calcified atherosclerosis at the skull base. No suspicious intracranial vascular hyperdensity. Skull: No acute osseous abnormality identified. Sinuses/Orbits: Partial opacification of the posterior right ethmoids is new since 2018. Other paranasal sinuses and mastoids are stable and well pneumatized. Tympanic cavities are clear. Other: Small left forehead scalp hematoma and/or laceration on series 4, image 47. Otherwise negative orbit and scalp  soft tissues. IMPRESSION: 1. Left forehead scalp soft tissue injury without underlying skull fracture. 2. Stable and negative for age non contrast CT appearance of the brain. Electronically Signed   By: Genevie Ann M.D.   On: 07/08/2019 14:03   Dg Chest Portable 1 View  Result Date: 07/08/2019 CLINICAL DATA:  58 year old male status post fall 1 week ago with weakness, nausea, cough. EXAM: PORTABLE CHEST 1 VIEW COMPARISON:  09/23/2017 portable chest and earlier. FINDINGS: Portable AP semi upright view at 1327 hours. Chronic elevation of the right hemidiaphragm, but possible superimposed small right pleural effusion. No right rib fracture identified. No pneumothorax identified. Cardiac and mediastinal contours are stable and within normal limits. Visualized tracheal air column is within normal limits. Allowing for portable technique the  left lung is clear. The right upper lung is clear. Negative visible bowel gas pattern. Surgical clips in the upper abdomen. IMPRESSION: 1. Possible small right pleural effusion superimposed on chronic elevation of the right hemidiaphragm. 2. No other acute cardiopulmonary abnormality. Electronically Signed   By: Genevie Ann M.D.   On: 07/08/2019 13:58   Vas [place] Lower Extremity Venous (dvt)  Result Date: 07/10/2019  Lower Venous Study Indications: Pain, and Recent fall x2 weeks.  Limitations: Poor ultrasound/tissue interface. Comparison Study: No prior study Performing Technologist: Maudry Mayhew MHA, RDMS, RVT, RDCS  Examination Guidelines: A complete evaluation includes B-mode imaging, spectral Doppler, color Doppler, and power Doppler as needed of all accessible portions of each vessel. Bilateral testing is considered an integral part of a complete examination. Limited examinations for reoccurring indications may be performed as noted.  +-----+---------------+---------+-----------+----------+--------------+  RIGHT Compressibility Phasicity Spontaneity Properties Summary          +-----+---------------+---------+-----------+----------+--------------+  CFV   Full            No        Yes                    Pulsatile flow  +-----+---------------+---------+-----------+----------+--------------+   +---------+---------------+---------+-----------+----------+------------------+  LEFT      Compressibility Phasicity Spontaneity Properties Summary             +---------+---------------+---------+-----------+----------+------------------+  CFV       Full            No        Yes                    Pulsatile flow      +---------+---------------+---------+-----------+----------+------------------+  SFJ       Full                                                                 +---------+---------------+---------+-----------+----------+------------------+  FV Prox   Full                                                                 +---------+---------------+---------+-----------+----------+------------------+  FV Mid    Full                                                                 +---------+---------------+---------+-----------+----------+------------------+  FV Distal Full                                                                 +---------+---------------+---------+-----------+----------+------------------+  PFV       Full                                                                 +---------+---------------+---------+-----------+----------+------------------+  POP       Full            No        Yes                    Pulsatile flow      +---------+---------------+---------+-----------+----------+------------------+  PTV       Full                                             Limited                                                                         visualization       +---------+---------------+---------+-----------+----------+------------------+  PERO                                                       Not visualized       +---------+---------------+---------+-----------+----------+------------------+     Summary: Right: No evidence of common femoral vein obstruction. Pulsatile venous flow is suggestive of possible elevated right heart pressure. Left: There is no evidence of deep vein thrombosis in the lower extremity. However, portions of this examination were limited- see technologist comments above. No cystic structure found in the popliteal fossa. Pulsatile venous flow is suggestive of possible elevated right heart pressure.  *See table(s) above for measurements and observations.    Preliminary     Medications:    aspirin  81 mg Oral Daily   carvedilol  12.5 mg Oral BID   docusate sodium  100 mg Oral BID   insulin aspart  0-15 Units Subcutaneous TID WC   insulin aspart  0-5 Units Subcutaneous QHS   mycophenolate  500 mg Oral Q12H   tacrolimus  1 mg Oral Q12H   Continuous Infusions:  cefTRIAXone (ROCEPHIN)  IV 1 g (07/10/19 1109)     LOS: 1 day   Geradine Girt  [facility]   How to contact the [facility] Attending or Consulting provider [place] or covering provider during after hours [place], for this patient?  1. Check the care team in [facility] and look for a) attending/consulting [facility] provider listed and b) the [facility] team listed 2. Log into [email or web address] and use [place]'s universal password to access. If you do not have the password, please contact the hospital operator. 3. Locate the [facility] provider you are looking for under [facility] and page to a number that you can be directly reached. 4. If you still have difficulty reaching the provider, please page the [facility] (Director on Call) for the Hospitalists listed on amion for assistance.  07/10/2019, 11:51 AM

## 2019-07-11 DIAGNOSIS — L0390 Cellulitis, unspecified: Secondary | ICD-10-CM

## 2019-07-11 LAB — GLUCOSE, CAPILLARY
Glucose-Capillary: 113 mg/dL — ABNORMAL HIGH (ref 70–99)
Glucose-Capillary: 106 mg/dL — ABNORMAL HIGH (ref 70–99)
Glucose-Capillary: 132 mg/dL — ABNORMAL HIGH (ref 70–99)
Glucose-Capillary: 98 mg/dL (ref 70–99)

## 2019-07-11 MED ORDER — CARVEDILOL 25 MG PO TABS
25.0000 mg | ORAL_TABLET | Freq: Two times a day (BID) | ORAL | Status: DC
  Administered 2019-07-11 – 2019-07-13 (×4): 25 mg via ORAL
  Filled 2019-07-11 (×4): qty 1

## 2019-07-11 NOTE — Progress Notes (Signed)
Pt BP elevated. NP on call notified.

## 2019-07-11 NOTE — Progress Notes (Signed)
Progress Note    Thomas [name]  JHE:[id number] DOB: September 13, 1961  DOA: 07/08/2019 PCP: Alroy Dust, L.Marlou Sa, MD    Brief Narrative:     Medical records reviewed and are as summarized below:  Thomas [name] is an 58 y.o. 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  He reports nausea problems and also was "drug down by the dog."  He was walking the dog and it dragged him down.  He has been nausea and vomiting spells.  Every 3-4 weeks, he seems to have a couple of days where he has n/v and then it subsides for a while.  His last episode was last weekend.  They went to [facility] and they suggested he come to the ER for a CT scan.   Assessment/Plan:   Principal Problem:   Dehydration with hyponatremia Active Problems:   Acute kidney injury superimposed on CKD (HCC)   Diabetes mellitus type 2 in nonobese [facility])   Essential hypertension   Diabetic gastroparesis (HCC)   H/O liver transplant ([place])  Hyponatremia -Etiology appears to be hypovolemic hyponatremia; patient with episodic n/v which occurred again over the weekend and ongoing decreased PO intake.   -His only significant symptom appears to be weakness, and this may improve with rehydration and correction of the hyponatremia -Serum osmolality is low-normal -Urine sodium was quite low, which is consistent with dehydration. -He did have marked proteinuria, but this improved with hydration -d/c'd IVF, check labs in AM  Left LE cellulitis -elevation -rocephin IV -[place]- poor study but no dvt seen  Probable gastroparesis -Patient appears to have episodic n/v which would be c/w gastroparesis -A gastric emptying study could be considered as an outpatient -Trial of Reglan or erythromycin could be considered, as well -For now, however, given acute illness, would suggest no additional  medications and monitoring since his symptoms have currently improved  DM -This was previously attributed to steroid-induced hyperglycemia and so has been covered with SSI without PO medication - A1c: 5.2  S/p liver transplant -Stable since 2018 transplant -He is on Cellcept and tacrolimus and so is immunosuppressed -These medications have been continued -Ongoing ETOH abstinence is essential -Due to f/u at [facility] but this has been delayed due to the COVID pandemic  CKD  -"stable creatinine" in 2/20 of 1.6 -Creatinine at admission was 1.91-- trended down  HTN -increase coreg for better coverage   Family Communication/Anticipated D/C date and plan/Code Status   DVT prophylaxis: scd Code Status: Full Code.  Family Communication:  Disposition Plan: home 24-48 hours   Medical Consultants:    None.    Subjective:   No issues eating, still with pain and swelling in left leg  Objective:    Vitals:   07/10/19 2220 07/11/19 0000 07/11/19 0639 07/11/19 0839  BP:  138/84 (!) 178/94 (!) 160/85  Pulse: 94  96 86  Resp:   18   Temp:   (!) 100.5 F (38.1 C) 100 F (37.8 C)  TempSrc:   Oral Oral  SpO2:   99%   Weight:      Height:        Intake/Output Summary (Last 24 hours) at 07/11/2019 1037 Last data filed at 07/11/2019 1856 Gross per 24 hour  Intake 1540 ml  Output 1425 ml  Net 115 ml   Filed Weights   07/08/19 1852  Weight: 72.9 kg  Exam: In bed, NAD rrr +BS, soft, NT Left leg slightly elevated on 1 blanket- lessened swelling to shin, more movement in ankle joint  Data Reviewed:   I have personally reviewed following labs and imaging studies:  Labs: Labs show the following:   Basic Metabolic Panel: Recent Labs  Lab 07/08/19 1006 07/08/19 1320 07/09/19 0303 07/10/19 0245  NA 126* 127* 130* 133*  K 3.9 3.7 3.5 3.8  CL 89*  --  95* 100  CO2 20*  --  22 24  GLUCOSE 266*  --  101* 100*  BUN 24*  --  24* 14  CREATININE 1.91*  --  1.62*  1.11  CALCIUM 8.8*  --  7.7* 7.5*   GFR Estimated Creatinine Clearance: 67.2 mL/min (by C-G formula based on SCr of 1.11 mg/dL). Liver Function Tests: Recent Labs  Lab 07/08/19 1006  AST 31  ALT 18  ALKPHOS 67  BILITOT 1.7*  PROT 7.0  ALBUMIN 3.6   Recent Labs  Lab 07/08/19 1006  LIPASE 20   No results for input(s): AMMONIA in the last 168 hours. Coagulation profile No results for input(s): INR, PROTIME in the last 168 hours.  CBC: Recent Labs  Lab 07/08/19 1006 07/08/19 1320 07/09/19 0303 07/10/19 0245  WBC 15.1*  --  9.3 8.6  HGB 13.4 13.9 10.7* 9.7*  HCT 38.7* 41.0 31.1* 28.4*  MCV 101.0*  --  100.3* 103.6*  PLT 66*  --  57* 73*   Cardiac Enzymes: No results for input(s): CKTOTAL, CKMB, CKMBINDEX, TROPONINI in the last 168 hours. BNP (last 3 results) No results for input(s): PROBNP in the last 8760 hours. CBG: Recent Labs  Lab 07/10/19 1154 07/10/19 1339 07/10/19 1621 07/10/19 2133 07/11/19 0837  GLUCAP 126* 108* 90 110* 113*   D-Dimer: No results for input(s): DDIMER in the last 72 hours. Hgb A1c: Recent Labs    07/08/19 1911  HGBA1C 5.2   Lipid Profile: No results for input(s): CHOL, HDL, LDLCALC, TRIG, CHOLHDL, LDLDIRECT in the last 72 hours. Thyroid function studies: No results for input(s): TSH, T4TOTAL, T3FREE, THYROIDAB in the last 72 hours.  Invalid input(s): FREET3 Anemia work up: No results for input(s): VITAMINB12, FOLATE, FERRITIN, TIBC, IRON, RETICCTPCT in the last 72 hours. Sepsis Labs: Recent Labs  Lab 07/08/19 1006 07/08/19 1241 07/09/19 0303 07/10/19 0245  WBC 15.1*  --  9.3 8.6  LATICACIDVEN  --  1.9  --   --     Microbiology Recent Results (from the past 240 hour(s))  SARS Coronavirus 2 (CEPHEID - Performed in [place] hospital lab), Hosp Order     Status: None   Collection Time: 07/08/19  1:20 PM   Specimen: Nasopharyngeal Swab  Result Value Ref Range Status   SARS Coronavirus 2 NEGATIVE NEGATIVE Final     Comment: (NOTE) If result is NEGATIVE SARS-CoV-2 target nucleic acids are NOT DETECTED. The SARS-CoV-2 RNA is generally detectable in upper and lower  respiratory specimens during the acute phase of infection. The lowest  concentration of SARS-CoV-2 viral copies this assay can detect is 250  copies / mL. A negative result does not preclude SARS-CoV-2 infection  and should not be used as the sole basis for treatment or other  patient management decisions.  A negative result may occur with  improper specimen collection / handling, submission of specimen other  than nasopharyngeal swab, presence of viral mutation(s) within the  areas targeted by this assay, and inadequate number of viral copies  (<250  copies / mL). A negative result must be combined with clinical  observations, patient history, and epidemiological information. If result is POSITIVE SARS-CoV-2 target nucleic acids are DETECTED. The SARS-CoV-2 RNA is generally detectable in upper and lower  respiratory specimens dur ing the acute phase of infection.  Positive  results are indicative of active infection with SARS-CoV-2.  Clinical  correlation with patient history and other diagnostic information is  necessary to determine patient infection status.  Positive results do  not rule out bacterial infection or co-infection with other viruses. If result is PRESUMPTIVE POSTIVE SARS-CoV-2 nucleic acids MAY BE PRESENT.   A presumptive positive result was obtained on the submitted specimen  and confirmed on repeat testing.  While 2019 novel coronavirus  (SARS-CoV-2) nucleic acids may be present in the submitted sample  additional confirmatory testing may be necessary for epidemiological  and / or clinical management purposes  to differentiate between  SARS-CoV-2 and other Sarbecovirus currently known to infect humans.  If clinically indicated additional testing with an alternate test  methodology [phone]) is advised. The SARS-CoV-2  RNA is generally  detectable in upper and lower respiratory sp ecimens during the acute  phase of infection. The expected result is Negative. Fact Sheet for Patients:  StrictlyIdeas.no Fact Sheet for Healthcare Providers: BankingDealers.co.za This test is not yet approved or cleared by the [place] FDA and has been authorized for detection and/or diagnosis of SARS-CoV-2 by FDA under an Emergency Use Authorization (EUA).  This EUA will remain in effect (meaning this test can be used) for the duration of the COVID-19 declaration under Section 564(b)(1) of the Act, 21 U.S.C. section 360bbb-3(b)(1), unless the authorization is terminated or revoked sooner. Performed at [facility], [place] [address] [place],  [address]     Procedures and diagnostic studies:  Vas [place] Lower Extremity Venous (dvt)  Result Date: 07/10/2019  Lower Venous Study Indications: Pain, and Recent fall x2 weeks.  Limitations: Poor ultrasound/tissue interface. Comparison Study: No prior study Performing Technologist: Maudry Mayhew MHA, RDMS, RVT, RDCS  Examination Guidelines: A complete evaluation includes B-mode imaging, spectral Doppler, color Doppler, and power Doppler as needed of all accessible portions of each vessel. Bilateral testing is considered an integral part of a complete examination. Limited examinations for reoccurring indications may be performed as noted.  +-----+---------------+---------+-----------+----------+--------------+  RIGHT Compressibility Phasicity Spontaneity Properties Summary         +-----+---------------+---------+-----------+----------+--------------+  CFV   Full            No        Yes                    Pulsatile flow  +-----+---------------+---------+-----------+----------+--------------+   +---------+---------------+---------+-----------+----------+------------------+  LEFT       Compressibility Phasicity Spontaneity Properties Summary             +---------+---------------+---------+-----------+----------+------------------+  CFV       Full            No        Yes                    Pulsatile flow      +---------+---------------+---------+-----------+----------+------------------+  SFJ       Full                                                                 +---------+---------------+---------+-----------+----------+------------------+  FV Prox   Full                                                                 +---------+---------------+---------+-----------+----------+------------------+  FV Mid    Full                                                                 +---------+---------------+---------+-----------+----------+------------------+  FV Distal Full                                                                 +---------+---------------+---------+-----------+----------+------------------+  PFV       Full                                                                 +---------+---------------+---------+-----------+----------+------------------+  POP       Full            No        Yes                    Pulsatile flow      +---------+---------------+---------+-----------+----------+------------------+  PTV       Full                                             Limited                                                                         visualization       +---------+---------------+---------+-----------+----------+------------------+  PERO                                                       Not visualized      +---------+---------------+---------+-----------+----------+------------------+     Summary: Right: No evidence of common femoral vein obstruction. Pulsatile venous flow is suggestive of possible elevated right heart pressure. Left: There is no evidence of deep vein thrombosis in the lower extremity. However, portions of this examination were limited- see  technologist comments above. No cystic structure found in the popliteal fossa. Pulsatile venous flow is  suggestive of possible elevated right heart pressure.  *See table(s) above for measurements and observations. Electronically signed by Monica Martinez MD on 07/10/2019 at 12:34:42 PM.    Final     Medications:    aspirin  81 mg Oral Daily   carvedilol  25 mg Oral BID   docusate sodium  100 mg Oral BID   insulin aspart  0-15 Units Subcutaneous TID WC   insulin aspart  0-5 Units Subcutaneous QHS   mycophenolate  500 mg Oral Q12H   tacrolimus  1 mg Oral Q12H   Continuous Infusions:  cefTRIAXone (ROCEPHIN)  IV 1 g (07/11/19 0845)     LOS: 2 days   Geradine Girt  [facility]   How to contact the [facility] Attending or Consulting provider [place] or covering provider during after hours [place], for this patient?  1. Check the care team in [facility] and look for a) attending/consulting TRH provider listed and b) the [facility] team listed 2. Log into [email or web address] and use [place]'s universal password to access. If you do not have the password, please contact the hospital operator. 3. Locate the [facility] provider you are looking for under [facility] and page to a number that you can be directly reached. 4. If you still have difficulty reaching the provider, please page the [facility] (Director on Call) for the Hospitalists listed on amion for assistance.  07/11/2019, 10:37 AM

## 2019-07-12 ENCOUNTER — Inpatient Hospital Stay (HOSPITAL_COMMUNITY): Payer: BLUE CROSS/BLUE SHIELD

## 2019-07-12 LAB — BASIC METABOLIC PANEL
Anion gap: 9 (ref 5–15)
BUN: 5 mg/dL — ABNORMAL LOW (ref 6–20)
CO2: 23 mmol/L (ref 22–32)
Calcium: 7.5 mg/dL — ABNORMAL LOW (ref 8.9–10.3)
Chloride: 101 mmol/L (ref 98–111)
Creatinine, Ser: 1.05 mg/dL (ref 0.61–1.24)
GFR calc Af Amer: >60 mL/min (ref >60)
GFR calc non Af Amer: >60 mL/min (ref >60)
Glucose, Bld: 106 mg/dL — ABNORMAL HIGH (ref 70–99)
Potassium: 3.3 mmol/L — ABNORMAL LOW (ref 3.5–5.1)
Sodium: 133 mmol/L — ABNORMAL LOW (ref 135–145)

## 2019-07-12 LAB — GLUCOSE, CAPILLARY
Glucose-Capillary: 91 mg/dL (ref 70–99)
Glucose-Capillary: 121 mg/dL — ABNORMAL HIGH (ref 70–99)
Glucose-Capillary: 124 mg/dL — ABNORMAL HIGH (ref 70–99)
Glucose-Capillary: 139 mg/dL — ABNORMAL HIGH (ref 70–99)

## 2019-07-12 LAB — CBC
HCT: 27.4 % — ABNORMAL LOW (ref 39.0–52.0)
Hemoglobin: 9.2 g/dL — ABNORMAL LOW (ref 13.0–17.0)
MCH: 34.3 pg — ABNORMAL HIGH (ref 26.0–34.0)
MCHC: 33.6 g/dL (ref 30.0–36.0)
MCV: 102.2 fL — ABNORMAL HIGH (ref 80.0–100.0)
Platelets: 117 10*3/uL — ABNORMAL LOW (ref 150–400)
RBC: 2.68 MIL/uL — ABNORMAL LOW (ref 4.22–5.81)
RDW: 12.9 % (ref 11.5–15.5)
WBC: 9.4 10*3/uL (ref 4.0–10.5)
nRBC: 0 % (ref 0.0–0.2)

## 2019-07-12 MED ORDER — SODIUM CHLORIDE 0.9 % IV SOLN
2.0000 g | INTRAVENOUS | Status: DC
  Administered 2019-07-13: 2 g via INTRAVENOUS
  Filled 2019-07-12: qty 20

## 2019-07-12 MED ORDER — POTASSIUM CHLORIDE CRYS ER 20 MEQ PO TBCR
40.0000 meq | EXTENDED_RELEASE_TABLET | Freq: Once | ORAL | Status: AC
  Administered 2019-07-12: 40 meq via ORAL
  Filled 2019-07-12: qty 2

## 2019-07-12 MED ORDER — ENOXAPARIN SODIUM 40 MG/0.4ML ~~LOC~~ SOLN
40.0000 mg | SUBCUTANEOUS | Status: DC
  Administered 2019-07-12 – 2019-07-13 (×2): 40 mg via SUBCUTANEOUS
  Filled 2019-07-12 (×2): qty 0.4

## 2019-07-12 NOTE — Progress Notes (Addendum)
Progress Note    Thomas [name]  PTW:[id number] DOB: 1961/08/29  DOA: 07/08/2019 PCP: Alroy Dust, L.Marlou Sa, MD    Brief Narrative:     Medical records reviewed and are as summarized below:  Thomas [name] is an 58 y.o. 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  He reports nausea problems and also was "drug down by the dog."  He was walking the dog and it dragged him down.  He has been nausea and vomiting spells.  Every 3-4 weeks, he seems to have a couple of days where he has n/v and then it subsides for a while.  His last episode was last weekend.  They went to [facility] and they suggested he come to the ER for a CT scan.   Assessment/Plan:   Principal Problem:   Dehydration with hyponatremia Active Problems:   Acute kidney injury superimposed on CKD (HCC)   Diabetes mellitus type 2 in nonobese [facility])   Essential hypertension   Diabetic gastroparesis (HCC)   H/O liver transplant ([place])   Cellulitis  Hyponatremia -Etiology appears to be hypovolemic hyponatremia; patient with episodic n/v which occurred again over the weekend and ongoing decreased PO intake.   -His only significant symptom appears to be weakness, and this may improve with rehydration and correction of the hyponatremia -Serum osmolality is low-normal -Urine sodium was quite low, which is consistent with dehydration. -He did have marked proteinuria, but this improved with hydration -d/c'd IVF, check labs in AM  Left LE cellulitis -elevation -rocephin IV -[place]- poor study but no dvt seen -will get dg tib/fib-- large nodule on shin-- ? Hematoma-- apply ice  Probable gastroparesis -Patient appears to have episodic n/v which would be c/w gastroparesis -A gastric emptying study could be considered as an outpatient -Trial of Reglan or erythromycin could be  considered, as well -For now, however, given acute illness, would suggest no additional medications and monitoring since his symptoms have currently improved  DM -This was previously attributed to steroid-induced hyperglycemia and so has been covered with SSI without PO medication - A1c: 5.2  S/p liver transplant -Stable since 2018 transplant -He is on Cellcept and tacrolimus and so is immunosuppressed -These medications have been continued -Ongoing ETOH abstinence is essential -Due to f/u at [facility] but this has been delayed due to the [place] pandemic  CKD  -"stable creatinine" in 2/20 of 1.6 -Creatinine at admission was 1.91-- trended down  HTN -increase coreg for better coverage  Hypokalemia -repleted   Will continue close monitoring as his on immunosuppressants  Family Communication/Anticipated D/C date and plan/Code Status   DVT prophylaxis: will change back to lovenox since SCDs have not been placed Code Status: Full Code.  Family Communication:  Disposition Plan: home 24-48 hours   Medical Consultants:    None.    Subjective:   Less swelling in his leg but nodule on upper portion is larger  Objective:    Vitals:   07/11/19 0639 07/11/19 0839 07/11/19 2036 07/12/19 1017  BP: (!) 178/94 (!) 160/85 (!) 188/93 (!) 147/88  Pulse: 96 86 90 94  Resp: 18  18   Temp: (!) 100.5 F (38.1 C) 100 F (37.8 C) 98.8 F (37.1 C)   TempSrc: Oral Oral Oral   SpO2: 99%  100%   Weight:      Height:  Intake/Output Summary (Last 24 hours) at 07/12/2019 1059 Last data filed at 07/12/2019 0600 Gross per 24 hour  Intake 480 ml  Output 1775 ml  Net -1295 ml   Filed Weights   07/08/19 1852  Weight: 72.9 kg    Exam: On side of bed, NAD rrr No increased work of breathing Left leg less swollen but 2x2 firm nodule on upper inner shin-- tender to palpation  Data Reviewed:   I have personally reviewed following labs and imaging studies:  Labs: Labs show  the following:   Basic Metabolic Panel: Recent Labs  Lab 07/08/19 1006 07/08/19 1320 07/09/19 0303 07/10/19 0245 07/12/19 0245  NA 126* 127* 130* 133* 133*  K 3.9 3.7 3.5 3.8 3.3*  CL 89*  --  95* 100 101  CO2 20*  --  22 24 23   GLUCOSE 266*  --  101* 100* 106*  BUN 24*  --  24* 14 5*  CREATININE 1.91*  --  1.62* 1.11 1.05  CALCIUM 8.8*  --  7.7* 7.5* 7.5*   GFR Estimated Creatinine Clearance: 71 mL/min (by C-G formula based on SCr of 1.05 mg/dL). Liver Function Tests: Recent Labs  Lab 07/08/19 1006  AST 31  ALT 18  ALKPHOS 67  BILITOT 1.7*  PROT 7.0  ALBUMIN 3.6   Recent Labs  Lab 07/08/19 1006  LIPASE 20   No results for input(s): AMMONIA in the last 168 hours. Coagulation profile No results for input(s): INR, PROTIME in the last 168 hours.  CBC: Recent Labs  Lab 07/08/19 1006 07/08/19 1320 07/09/19 0303 07/10/19 0245 07/12/19 0245  WBC 15.1*  --  9.3 8.6 9.4  HGB 13.4 13.9 10.7* 9.7* 9.2*  HCT 38.7* 41.0 31.1* 28.4* 27.4*  MCV 101.0*  --  100.3* 103.6* 102.2*  PLT 66*  --  57* 73* 117*   Cardiac Enzymes: No results for input(s): CKTOTAL, CKMB, CKMBINDEX, TROPONINI in the last 168 hours. BNP (last 3 results) No results for input(s): PROBNP in the last 8760 hours. CBG: Recent Labs  Lab 07/11/19 0837 07/11/19 1216 07/11/19 1751 07/11/19 2140 07/12/19 0805  GLUCAP 113* 106* 132* 98 91   D-Dimer: No results for input(s): DDIMER in the last 72 hours. Hgb A1c: No results for input(s): HGBA1C in the last 72 hours. Lipid Profile: No results for input(s): CHOL, HDL, LDLCALC, TRIG, CHOLHDL, LDLDIRECT in the last 72 hours. Thyroid function studies: No results for input(s): TSH, T4TOTAL, T3FREE, THYROIDAB in the last 72 hours.  Invalid input(s): FREET3 Anemia work up: No results for input(s): VITAMINB12, FOLATE, FERRITIN, TIBC, IRON, RETICCTPCT in the last 72 hours. Sepsis Labs: Recent Labs  Lab 07/08/19 1006 07/08/19 1241 07/09/19 0303  07/10/19 0245 07/12/19 0245  WBC 15.1*  --  9.3 8.6 9.4  LATICACIDVEN  --  1.9  --   --   --     Microbiology Recent Results (from the past 240 hour(s))  SARS Coronavirus 2 (CEPHEID - Performed in [place] hospital lab), Hosp Order     Status: None   Collection Time: 07/08/19  1:20 PM   Specimen: Nasopharyngeal Swab  Result Value Ref Range Status   SARS Coronavirus 2 NEGATIVE NEGATIVE Final    Comment: (NOTE) If result is NEGATIVE SARS-CoV-2 target nucleic acids are NOT DETECTED. The SARS-CoV-2 RNA is generally detectable in upper and lower  respiratory specimens during the acute phase of infection. The lowest  concentration of SARS-CoV-2 viral copies this assay can detect is 250  copies /  mL. A negative result does not preclude SARS-CoV-2 infection  and should not be used as the sole basis for treatment or other  patient management decisions.  A negative result may occur with  improper specimen collection / handling, submission of specimen other  than nasopharyngeal swab, presence of viral mutation(s) within the  areas targeted by this assay, and inadequate number of viral copies  (<250 copies / mL). A negative result must be combined with clinical  observations, patient history, and epidemiological information. If result is POSITIVE SARS-CoV-2 target nucleic acids are DETECTED. The SARS-CoV-2 RNA is generally detectable in upper and lower  respiratory specimens dur ing the acute phase of infection.  Positive  results are indicative of active infection with SARS-CoV-2.  Clinical  correlation with patient history and other diagnostic information is  necessary to determine patient infection status.  Positive results do  not rule out bacterial infection or co-infection with other viruses. If result is PRESUMPTIVE POSTIVE SARS-CoV-2 nucleic acids MAY BE PRESENT.   A presumptive positive result was obtained on the submitted specimen  and confirmed on repeat testing.  While 2019  novel coronavirus  (SARS-CoV-2) nucleic acids may be present in the submitted sample  additional confirmatory testing may be necessary for epidemiological  and / or clinical management purposes  to differentiate between  SARS-CoV-2 and other Sarbecovirus currently known to infect humans.  If clinically indicated additional testing with an alternate test  methodology [phone]) is advised. The SARS-CoV-2 RNA is generally  detectable in upper and lower respiratory sp ecimens during the acute  phase of infection. The expected result is Negative. Fact Sheet for Patients:  StrictlyIdeas.no Fact Sheet for Healthcare Providers: BankingDealers.co.za This test is not yet approved or cleared by the [place] FDA and has been authorized for detection and/or diagnosis of SARS-CoV-2 by FDA under an Emergency Use Authorization (EUA).  This EUA will remain in effect (meaning this test can be used) for the duration of the COVID-19 declaration under Section 564(b)(1) of the Act, 21 U.S.C. section 360bbb-3(b)(1), unless the authorization is terminated or revoked sooner. Performed at [facility], [place] [address] [place], [place] [address]     Procedures and diagnostic studies:  No results found.  Medications:   . aspirin  81 mg Oral Daily  . carvedilol  25 mg Oral BID  . docusate sodium  100 mg Oral BID  . insulin aspart  0-15 Units Subcutaneous TID WC  . insulin aspart  0-5 Units Subcutaneous QHS  . mycophenolate  500 mg Oral Q12H  . tacrolimus  1 mg Oral Q12H   Continuous Infusions: . cefTRIAXone (ROCEPHIN)  IV 1 g (07/12/19 1005)     LOS: 3 days   Geradine Girt  [facility]   How to contact the [facility] Attending or Consulting provider [place] or covering provider during after hours [place], for this patient?  1. Check the care team in [facility] and look for a) attending/consulting TRH provider listed and b) the [facility] team listed  2. Log into [email or web address] and use [place]'s universal password to access. If you do not have the password, please contact the hospital operator. 3. Locate the [facility] provider you are looking for under [facility] and page to a number that you can be directly reached. 4. If you still have difficulty reaching the provider, please page the [facility] (Director on Call) for the Hospitalists listed on amion for assistance.  07/12/2019, 10:59 AM

## 2019-07-12 NOTE — Plan of Care (Signed)
  Problem: Nutrition: Goal: Adequate nutrition will be maintained Outcome: Not Progressing Variance Did not return to baseline function

## 2019-07-12 NOTE — Plan of Care (Signed)
Patient tolerating food well, pain edema and redness noted to left LE encouraged patient to keep extremity elevated.

## 2019-07-13 LAB — BASIC METABOLIC PANEL
Anion gap: 11 (ref 5–15)
BUN: 5 mg/dL — ABNORMAL LOW (ref 6–20)
CO2: 22 mmol/L (ref 22–32)
Calcium: 7.9 mg/dL — ABNORMAL LOW (ref 8.9–10.3)
Chloride: 100 mmol/L (ref 98–111)
Creatinine, Ser: 0.96 mg/dL (ref 0.61–1.24)
GFR calc Af Amer: >60 mL/min (ref >60)
GFR calc non Af Amer: >60 mL/min (ref >60)
Glucose, Bld: 153 mg/dL — ABNORMAL HIGH (ref 70–99)
Potassium: 3.4 mmol/L — ABNORMAL LOW (ref 3.5–5.1)
Sodium: 133 mmol/L — ABNORMAL LOW (ref 135–145)

## 2019-07-13 LAB — GLUCOSE, CAPILLARY
Glucose-Capillary: 100 mg/dL — ABNORMAL HIGH (ref 70–99)
Glucose-Capillary: 123 mg/dL — ABNORMAL HIGH (ref 70–99)

## 2019-07-13 MED ORDER — CARVEDILOL 25 MG PO TABS: 25.0000 mg | ORAL_TABLET | Freq: Two times a day (BID) | ORAL | 0 refills | Status: AC

## 2019-07-13 MED ORDER — DOXYCYCLINE HYCLATE 100 MG PO TABS: 100.0000 mg | ORAL_TABLET | Freq: Two times a day (BID) | ORAL | Status: DC

## 2019-07-13 MED ORDER — DOXYCYCLINE HYCLATE 100 MG PO TABS
100.0000 mg | ORAL_TABLET | Freq: Two times a day (BID) | ORAL | Status: DC
  Administered 2019-07-13: 100 mg via ORAL
  Filled 2019-07-13: qty 1

## 2019-07-13 MED ORDER — AMOXICILLIN-POT CLAVULANATE 875-125 MG PO TABS: 1.0000 | ORAL_TABLET | Freq: Two times a day (BID) | ORAL | Status: DC

## 2019-07-13 MED ORDER — DOXYCYCLINE HYCLATE 100 MG PO TABS: 100.0000 mg | ORAL_TABLET | Freq: Two times a day (BID) | ORAL | 0 refills | Status: DC

## 2019-07-13 NOTE — Progress Notes (Signed)
1452: Patient discharged home via family vehicle. Discharge summary performed. Personal items provided to patient.

## 2019-07-13 NOTE — Discharge Summary (Signed)
Physician Discharge Summary  Thomas [name] WFU:[id number] DOB: [birth year] DOA: 07/08/2019  PCP: Alroy Dust, L.Marlou Sa, MD  Admit date: 07/08/2019 Discharge date: 07/13/2019  Admitted From: home Discharge disposition: home   Recommendations for Outpatient Follow-Up:   Will need close outpatient monitoring of left lower ext cellulitis-- If does not continue to improve, may need CT scan/U/S of shin swelling  Discharge Diagnosis:   Principal Problem:   Dehydration with hyponatremia Active Problems:   Acute kidney injury superimposed on CKD ([place])   Diabetes mellitus type 2 in nonobese [facility])   Essential hypertension   Diabetic gastroparesis ([place])   H/O liver transplant ([place])   Cellulitis    Discharge Condition: Improved.  Diet recommendation: Low sodium, heart healthy.  Carbohydrate-modified.  Wound care: None.  Code status: Full.   History of Present Illness:   Thomas [name] is a 58 y.o. 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  He reports nausea problems and also was "drug down by the dog."  He was walking the dog and it dragged him down.  He has been nausea and vomiting spells.  Every 3-4 weeks, he seems to have a couple of days where he has n/v and then it subsides for a while.  His last episode was last weekend.  They went to [facility] and they suggested he come to the ER for a CT scan.  He hasn't been able to keep anything on his stomach with these spells.  He is improving and has been better able to eat/drink yesterday and today - he had a 1/2 PB sandwich yesterday and a graham cracker this AM.  He thinks he has been doing well following his transplant.  Since his fall, he has been using a cane.  He gets weak and unsteady with his vomiting episodes.    Hospital Course by Problem:   Hyponatremia -Etiology  appears to be hypovolemic hyponatremia; patient withepisodic n/v  -Urine sodium was quite low, which is consistent with dehydration. -He did have marked proteinuria, but this improved with hydration  Left LE cellulitis -elevation -rocephin IV-- change to PO doxy-- swelling much improved along with pain but will need very close monitoring -[place]- poor study but no dvt seen  large nodule on shin that appears to be a Hematoma-- apply ice  Probable gastroparesis -Patient appears to have episodic n/v which would be c/w gastroparesis -A gastric emptying study could be considered as an outpatient -Trial of Reglan or erythromycin could be considered, as well -For now, however, given acute illness, would suggest no additional medications and monitoring since his symptoms have currently improved  DM -This was previously attributed to steroid-induced hyperglycemia and so has been covered with SSI without PO medication - A1c: 5.2  S/p liver transplant -Stable since 2018 transplant -He is onCellceptand tacrolimus and so is immunosuppressed -These medications have been continued -Ongoing ETOH abstinence is essential -Due to f/u at [facility] but this has been delayed due to the COVID pandemic  CKD  -"stable creatinine" in 2/20of 1.6 -Creatinine at admission was 1.91-- trended down to normal  HTN -increase coreg for better coverage  Hypokalemia -repleted     Medical Consultants:      Discharge Exam:   Vitals:   07/13/19 0954 07/13/19 1234  BP: 135/84 (!) 164/86  Pulse: 85 86  Resp:    Temp: 99.9 F (37.7  C) 99.1 F (37.3 C)  SpO2: 98% 98%   Vitals:   07/12/19 2244 07/13/19 0619 07/13/19 0954 07/13/19 1234  BP: (!) 174/88 (!) 165/84 135/84 (!) 164/86  Pulse: 87 85 85 86  Resp: 16 16    Temp: 99.8 F (37.7 C) 100 F (37.8 C) 99.9 F (37.7 C) 99.1 F (37.3 C)  TempSrc: Oral Oral    SpO2: 100% 98% 98% 98%  Weight:      Height:        General exam: Appears calm  and comfortable.anxious to go home   The results of significant diagnostics from this hospitalization (including imaging, microbiology, ancillary and laboratory) are listed below for reference.     Procedures and Diagnostic Studies:   Ct Head Wo Contrast  Result Date: 07/08/2019 CLINICAL DATA:  58 year old male status post fall 1 week ago with subsequent weakness6, nausea. EXAM: CT HEAD WITHOUT CONTRAST TECHNIQUE: Contiguous axial images were obtained from the base of the skull through the vertex without intravenous contrast. COMPARISON:  Head CT 04/27/2017. FINDINGS: Brain: Stable cerebral volume since 2018. No midline shift, ventriculomegaly, mass effect, evidence of mass lesion, intracranial hemorrhage or evidence of cortically based acute infarction. Gray-white matter differentiation is within normal limits throughout the brain. Vascular: Calcified atherosclerosis at the skull base. No suspicious intracranial vascular hyperdensity. Skull: No acute osseous abnormality identified. Sinuses/Orbits: Partial opacification of the posterior right ethmoids is new since 2018. Other paranasal sinuses and mastoids are stable and well pneumatized. Tympanic cavities are clear. Other: Small left forehead scalp hematoma and/or laceration on series 4, image 47. Otherwise negative orbit and scalp soft tissues. IMPRESSION: 1. Left forehead scalp soft tissue injury without underlying skull fracture. 2. Stable and negative for age non contrast CT appearance of the brain. Electronically Signed   By: Genevie Ann M.D.   On: 07/08/2019 14:03   Dg Chest Portable 1 View  Result Date: 07/08/2019 CLINICAL DATA:  58 year old male status post fall 1 week ago with weakness, nausea, cough. EXAM: PORTABLE CHEST 1 VIEW COMPARISON:  09/23/2017 portable chest and earlier. FINDINGS: Portable AP semi upright view at 1327 hours. Chronic elevation of the right hemidiaphragm, but possible superimposed small right pleural effusion. No right rib  fracture identified. No pneumothorax identified. Cardiac and mediastinal contours are stable and within normal limits. Visualized tracheal air column is within normal limits. Allowing for portable technique the left lung is clear. The right upper lung is clear. Negative visible bowel gas pattern. Surgical clips in the upper abdomen. IMPRESSION: 1. Possible small right pleural effusion superimposed on chronic elevation of the right hemidiaphragm. 2. No other acute cardiopulmonary abnormality. Electronically Signed   By: Genevie Ann M.D.   On: 07/08/2019 13:58     Labs:   Basic Metabolic Panel: Recent Labs  Lab 07/08/19 1006 07/08/19 1320 07/09/19 0303 07/10/19 0245 07/12/19 0245 07/13/19 1008  NA 126* 127* 130* 133* 133* 133*  K 3.9 3.7 3.5 3.8 3.3* 3.4*  CL 89*  --  95* 100 101 100  CO2 20*  --  22 24 23 22   GLUCOSE 266*  --  101* 100* 106* 153*  BUN 24*  --  24* 14 5* 5*  CREATININE 1.91*  --  1.62* 1.11 1.05 0.96  CALCIUM 8.8*  --  7.7* 7.5* 7.5* 7.9*   GFR Estimated Creatinine Clearance: 77.7 mL/min (by C-G formula based on SCr of 0.96 mg/dL). Liver Function Tests: Recent Labs  Lab 07/08/19 1006  AST 31  ALT  18  ALKPHOS 67  BILITOT 1.7*  PROT 7.0  ALBUMIN 3.6   Recent Labs  Lab 07/08/19 1006  LIPASE 20   No results for input(s): AMMONIA in the last 168 hours. Coagulation profile No results for input(s): INR, PROTIME in the last 168 hours.  CBC: Recent Labs  Lab 07/08/19 1006 07/08/19 1320 07/09/19 0303 07/10/19 0245 07/12/19 0245  WBC 15.1*  --  9.3 8.6 9.4  HGB 13.4 13.9 10.7* 9.7* 9.2*  HCT 38.7* 41.0 31.1* 28.4* 27.4*  MCV 101.0*  --  100.3* 103.6* 102.2*  PLT 66*  --  57* 73* 117*   Cardiac Enzymes: No results for input(s): CKTOTAL, CKMB, CKMBINDEX, TROPONINI in the last 168 hours. BNP: Invalid input(s): POCBNP CBG: Recent Labs  Lab 07/12/19 1256 07/12/19 1716 07/12/19 2246 07/13/19 0738 07/13/19 1140  GLUCAP 121* 124* 139* 100* 123*    D-Dimer No results for input(s): DDIMER in the last 72 hours. Hgb A1c No results for input(s): HGBA1C in the last 72 hours. Lipid Profile No results for input(s): CHOL, HDL, LDLCALC, TRIG, CHOLHDL, LDLDIRECT in the last 72 hours. Thyroid function studies No results for input(s): TSH, T4TOTAL, T3FREE, THYROIDAB in the last 72 hours.  Invalid input(s): FREET3 Anemia work up No results for input(s): VITAMINB12, FOLATE, FERRITIN, TIBC, IRON, RETICCTPCT in the last 72 hours. Microbiology Recent Results (from the past 240 hour(s))  SARS Coronavirus 2 (CEPHEID - Performed in [place] hospital lab), Hosp Order     Status: None   Collection Time: 07/08/19  1:20 PM   Specimen: Nasopharyngeal Swab  Result Value Ref Range Status   SARS Coronavirus 2 NEGATIVE NEGATIVE Final    Comment: (NOTE) If result is NEGATIVE SARS-CoV-2 target nucleic acids are NOT DETECTED. The SARS-CoV-2 RNA is generally detectable in upper and lower  respiratory specimens during the acute phase of infection. The lowest  concentration of SARS-CoV-2 viral copies this assay can detect is 250  copies / mL. A negative result does not preclude SARS-CoV-2 infection  and should not be used as the sole basis for treatment or other  patient management decisions.  A negative result may occur with  improper specimen collection / handling, submission of specimen other  than nasopharyngeal swab, presence of viral mutation(s) within the  areas targeted by this assay, and inadequate number of viral copies  (<250 copies / mL). A negative result must be combined with clinical  observations, patient history, and epidemiological information. If result is POSITIVE SARS-CoV-2 target nucleic acids are DETECTED. The SARS-CoV-2 RNA is generally detectable in upper and lower  respiratory specimens dur ing the acute phase of infection.  Positive  results are indicative of active infection with SARS-CoV-2.  Clinical  correlation with  patient history and other diagnostic information is  necessary to determine patient infection status.  Positive results do  not rule out bacterial infection or co-infection with other viruses. If result is PRESUMPTIVE POSTIVE SARS-CoV-2 nucleic acids MAY BE PRESENT.   A presumptive positive result was obtained on the submitted specimen  and confirmed on repeat testing.  While 2019 novel coronavirus  (SARS-CoV-2) nucleic acids may be present in the submitted sample  additional confirmatory testing may be necessary for epidemiological  and / or clinical management purposes  to differentiate between  SARS-CoV-2 and other Sarbecovirus currently known to infect humans.  If clinically indicated additional testing with an alternate test  methodology [phone]) is advised. The SARS-CoV-2 RNA is generally  detectable in upper and lower  respiratory sp ecimens during the acute  phase of infection. The expected result is Negative. Fact Sheet for Patients:  StrictlyIdeas.no Fact Sheet for Healthcare Providers: BankingDealers.co.za This test is not yet approved or cleared by the [place] FDA and has been authorized for detection and/or diagnosis of SARS-CoV-2 by FDA under an Emergency Use Authorization (EUA).  This EUA will remain in effect (meaning this test can be used) for the duration of the COVID-19 declaration under Section 564(b)(1) of the Act, 21 U.S.C. section 360bbb-3(b)(1), unless the authorization is terminated or revoked sooner. Performed at [facility], [place] [address], [place], [place] [address]      Discharge Instructions:   Discharge Instructions    Call MD for:  temperature >100.4   Complete by: As directed    Diet - low sodium heart healthy   Complete by: As directed    Diet Carb Modified   Complete by: As directed    Discharge instructions   Complete by: As directed    Keep leg elevated Can use ice on  ankle/bruise   Increase activity slowly   Complete by: As directed      Allergies as of 07/13/2019      Reactions   Xifaxan [rifaximin] Swelling, Other (See Comments)   Reaction:  All over body swelling       Medication List    TAKE these medications   aspirin 81 MG chewable tablet Chew 81 mg by mouth daily.   bismuth subsalicylate 115 MG chewable tablet Commonly known as: PEPTO BISMOL Chew 262-524 mg by mouth 3 (three) times daily as needed for indigestion.   calcium carbonate 750 MG chewable tablet Commonly known as: TUMS EX Chew 1-2 tablets by mouth 3 (three) times daily as needed for heartburn.   carvedilol 25 MG tablet Commonly known as: COREG Take 1 tablet (25 mg total) by mouth 2 (two) times a day. What changed:   medication strength  how much to take   doxycycline 100 MG tablet Commonly known as: VIBRA-TABS Take 1 tablet (100 mg total) by mouth every 12 (twelve) hours.   insulin regular 100 units/mL injection Commonly known as: NOVOLIN R Inject 4-6 Units into the skin See admin instructions. Sliding scale   mycophenolate 250 MG capsule Commonly known as: CELLCEPT Take 500 mg by mouth every 12 (twelve) hours.   tacrolimus 0.5 MG capsule Commonly known as: PROGRAF Take 1 mg by mouth every 12 (twelve) hours.         Time coordinating discharge: 35 min  Signed:  Geradine Girt DO  [facility] 07/13/2019, 12:50 PM

## 2019-07-16 DIAGNOSIS — Z944 Liver transplant status: Secondary | ICD-10-CM | POA: Diagnosis not present

## 2019-08-28 DIAGNOSIS — E119 Type 2 diabetes mellitus without complications: Secondary | ICD-10-CM | POA: Diagnosis not present

## 2019-08-28 DIAGNOSIS — K279 Peptic ulcer, site unspecified, unspecified as acute or chronic, without hemorrhage or perforation: Secondary | ICD-10-CM | POA: Diagnosis not present

## 2019-08-28 DIAGNOSIS — Z1339 Encounter for screening examination for other mental health and behavioral disorders: Secondary | ICD-10-CM | POA: Diagnosis not present

## 2019-08-28 DIAGNOSIS — G629 Polyneuropathy, unspecified: Secondary | ICD-10-CM | POA: Diagnosis not present

## 2019-08-28 DIAGNOSIS — I10 Essential (primary) hypertension: Secondary | ICD-10-CM | POA: Diagnosis not present

## 2019-08-28 DIAGNOSIS — Z23 Encounter for immunization: Secondary | ICD-10-CM | POA: Diagnosis not present

## 2019-10-10 DIAGNOSIS — Z944 Liver transplant status: Secondary | ICD-10-CM | POA: Diagnosis not present

## 2020-04-28 ENCOUNTER — Emergency Department (HOSPITAL_COMMUNITY): Payer: Medicare Other

## 2020-04-28 ENCOUNTER — Other Ambulatory Visit: Payer: Self-pay

## 2020-04-28 ENCOUNTER — Inpatient Hospital Stay (HOSPITAL_COMMUNITY)
Admission: EM | Admit: 2020-04-28 | Discharge: 2020-05-01 | DRG: 897 | Disposition: A | Discharge status: Home / Self Care | Payer: Medicare Other | Attending: Internal Medicine | Admitting: Internal Medicine

## 2020-04-28 DIAGNOSIS — Z7982 Long term (current) use of aspirin: Secondary | ICD-10-CM

## 2020-04-28 DIAGNOSIS — R000 Tachycardia, unspecified: Secondary | ICD-10-CM | POA: Diagnosis present

## 2020-04-28 DIAGNOSIS — Z639 Problem related to primary support group, unspecified: Secondary | ICD-10-CM

## 2020-04-28 DIAGNOSIS — Z20822 Contact with and (suspected) exposure to covid-19: Secondary | ICD-10-CM | POA: Diagnosis present

## 2020-04-28 DIAGNOSIS — K7030 Alcoholic cirrhosis of liver without ascites: Secondary | ICD-10-CM | POA: Diagnosis not present

## 2020-04-28 DIAGNOSIS — E785 Hyperlipidemia, unspecified: Secondary | ICD-10-CM | POA: Diagnosis present

## 2020-04-28 DIAGNOSIS — Z944 Liver transplant status: Secondary | ICD-10-CM

## 2020-04-28 DIAGNOSIS — Z833 Family history of diabetes mellitus: Secondary | ICD-10-CM | POA: Diagnosis not present

## 2020-04-28 DIAGNOSIS — E872 Acidosis: Secondary | ICD-10-CM | POA: Diagnosis present

## 2020-04-28 DIAGNOSIS — Z794 Long term (current) use of insulin: Secondary | ICD-10-CM | POA: Diagnosis not present

## 2020-04-28 DIAGNOSIS — E1143 Type 2 diabetes mellitus with diabetic autonomic (poly)neuropathy: Secondary | ICD-10-CM | POA: Diagnosis present

## 2020-04-28 DIAGNOSIS — I10 Essential (primary) hypertension: Secondary | ICD-10-CM | POA: Diagnosis present

## 2020-04-28 DIAGNOSIS — K7460 Unspecified cirrhosis of liver: Secondary | ICD-10-CM

## 2020-04-28 DIAGNOSIS — F10239 Alcohol dependence with withdrawal, unspecified: Principal | ICD-10-CM

## 2020-04-28 DIAGNOSIS — F10939 Alcohol use, unspecified with withdrawal, unspecified: Secondary | ICD-10-CM | POA: Diagnosis present

## 2020-04-28 DIAGNOSIS — K7031 Alcoholic cirrhosis of liver with ascites: Secondary | ICD-10-CM | POA: Diagnosis present

## 2020-04-28 DIAGNOSIS — R188 Other ascites: Secondary | ICD-10-CM

## 2020-04-28 DIAGNOSIS — F32A Depression, unspecified: Secondary | ICD-10-CM

## 2020-04-28 DIAGNOSIS — Z79899 Other long term (current) drug therapy: Secondary | ICD-10-CM

## 2020-04-28 DIAGNOSIS — Z888 Allergy status to other drugs, medicaments and biological substances status: Secondary | ICD-10-CM | POA: Diagnosis not present

## 2020-04-28 DIAGNOSIS — N179 Acute kidney failure, unspecified: Secondary | ICD-10-CM | POA: Diagnosis present

## 2020-04-28 DIAGNOSIS — F329 Major depressive disorder, single episode, unspecified: Secondary | ICD-10-CM | POA: Diagnosis present

## 2020-04-28 DIAGNOSIS — K219 Gastro-esophageal reflux disease without esophagitis: Secondary | ICD-10-CM | POA: Diagnosis present

## 2020-04-28 DIAGNOSIS — F1010 Alcohol abuse, uncomplicated: Secondary | ICD-10-CM

## 2020-04-28 DIAGNOSIS — K3184 Gastroparesis: Secondary | ICD-10-CM | POA: Diagnosis present

## 2020-04-28 LAB — CBC WITH DIFFERENTIAL/PLATELET
Abs Immature Granulocytes: 0.01 10*3/uL (ref 0.00–0.07)
Basophils Absolute: 0 10*3/uL (ref 0.0–0.1)
Basophils Relative: 1 %
Eosinophils Absolute: 0.1 10*3/uL (ref 0.0–0.5)
Eosinophils Relative: 2 %
HCT: 40.4 % (ref 39.0–52.0)
Hemoglobin: 13.5 g/dL (ref 13.0–17.0)
Immature Granulocytes: 0 %
Lymphocytes Relative: 20 %
Lymphs Abs: 0.9 10*3/uL (ref 0.7–4.0)
MCH: 35.2 pg — ABNORMAL HIGH (ref 26.0–34.0)
MCHC: 33.4 g/dL (ref 30.0–36.0)
MCV: 105.5 fL — ABNORMAL HIGH (ref 80.0–100.0)
Monocytes Absolute: 0.5 10*3/uL (ref 0.1–1.0)
Monocytes Relative: 11 %
Neutro Abs: 3 10*3/uL (ref 1.7–7.7)
Neutrophils Relative %: 66 %
Platelets: 122 10*3/uL — ABNORMAL LOW (ref 150–400)
RBC: 3.83 MIL/uL — ABNORMAL LOW (ref 4.22–5.81)
RDW: 13.1 % (ref 11.5–15.5)
WBC: 4.5 10*3/uL (ref 4.0–10.5)
nRBC: 0 % (ref 0.0–0.2)

## 2020-04-28 LAB — COMPREHENSIVE METABOLIC PANEL
ALT: 18 U/L (ref 0–44)
AST: 34 U/L (ref 15–41)
Albumin: 3.7 g/dL (ref 3.5–5.0)
Alkaline Phosphatase: 53 U/L (ref 38–126)
Anion gap: 16 — ABNORMAL HIGH (ref 5–15)
BUN: 20 mg/dL (ref 6–20)
CO2: 18 mmol/L — ABNORMAL LOW (ref 22–32)
Calcium: 8.7 mg/dL — ABNORMAL LOW (ref 8.9–10.3)
Chloride: 105 mmol/L (ref 98–111)
Creatinine, Ser: 2.01 mg/dL — ABNORMAL HIGH (ref 0.61–1.24)
GFR calc Af Amer: 41 mL/min — ABNORMAL LOW (ref >60)
GFR calc non Af Amer: 36 mL/min — ABNORMAL LOW (ref >60)
Glucose, Bld: 135 mg/dL — ABNORMAL HIGH (ref 70–99)
Potassium: 4.8 mmol/L (ref 3.5–5.1)
Sodium: 139 mmol/L (ref 135–145)
Total Bilirubin: 0.6 mg/dL (ref 0.3–1.2)
Total Protein: 6.7 g/dL (ref 6.5–8.1)

## 2020-04-28 LAB — ETHANOL
Alcohol, Ethyl (B): 313 mg/dL (ref <10)
Alcohol, Ethyl (B): 92 mg/dL — ABNORMAL HIGH (ref <10)

## 2020-04-28 LAB — CBG MONITORING, ED: Glucose-Capillary: 148 mg/dL — ABNORMAL HIGH (ref 70–99)

## 2020-04-28 LAB — SARS CORONAVIRUS 2 BY RT PCR (HOSPITAL ORDER, PERFORMED IN ~~LOC~~ HOSPITAL LAB): SARS Coronavirus 2: NEGATIVE

## 2020-04-28 MED ORDER — SODIUM CHLORIDE 0.9 % IV BOLUS
1000.0000 mL | Freq: Once | INTRAVENOUS | Status: AC
  Administered 2020-04-28: 1000 mL via INTRAVENOUS

## 2020-04-28 MED ORDER — GABAPENTIN 300 MG PO CAPS
300.0000 mg | ORAL_CAPSULE | Freq: Two times a day (BID) | ORAL | Status: DC
  Administered 2020-04-29 – 2020-05-01 (×6): 300 mg via ORAL
  Filled 2020-04-28: qty 3
  Filled 2020-04-28 (×4): qty 1
  Filled 2020-04-28: qty 3

## 2020-04-28 MED ORDER — INSULIN ASPART 100 UNIT/ML ~~LOC~~ SOLN
0.0000 [IU] | Freq: Three times a day (TID) | SUBCUTANEOUS | Status: DC
  Administered 2020-04-29: 3 [IU] via SUBCUTANEOUS
  Administered 2020-04-29 – 2020-05-01 (×4): 2 [IU] via SUBCUTANEOUS

## 2020-04-28 MED ORDER — LABETALOL HCL 5 MG/ML IV SOLN
10.0000 mg | Freq: Once | INTRAVENOUS | Status: AC
  Administered 2020-04-29: 10 mg via INTRAVENOUS
  Filled 2020-04-28: qty 4

## 2020-04-28 MED ORDER — CARVEDILOL 25 MG PO TABS
25.0000 mg | ORAL_TABLET | Freq: Every day | ORAL | Status: DC
  Administered 2020-04-29 – 2020-05-01 (×3): 25 mg via ORAL
  Filled 2020-04-28: qty 2
  Filled 2020-04-28 (×2): qty 1

## 2020-04-28 MED ORDER — CLOBETASOL PROPIONATE 0.05 % EX OINT
1.0000 "application " | TOPICAL_OINTMENT | Freq: Two times a day (BID) | CUTANEOUS | Status: DC
  Filled 2020-04-28: qty 15

## 2020-04-28 MED ORDER — LORAZEPAM 2 MG/ML IJ SOLN: 1.0000 mg | Freq: Once | INTRAMUSCULAR | Status: DC

## 2020-04-28 MED ORDER — PANTOPRAZOLE SODIUM 40 MG PO TBEC
40.0000 mg | DELAYED_RELEASE_TABLET | Freq: Every day | ORAL | Status: DC
  Administered 2020-04-29 – 2020-05-01 (×3): 40 mg via ORAL
  Filled 2020-04-28 (×3): qty 1

## 2020-04-28 MED ORDER — INSULIN ASPART 100 UNIT/ML ~~LOC~~ SOLN: 0.0000 [IU] | Freq: Every day | SUBCUTANEOUS | Status: DC

## 2020-04-28 MED ORDER — THIAMINE HCL 100 MG PO TABS: 100.0000 mg | ORAL_TABLET | Freq: Every day | ORAL | Status: DC

## 2020-04-28 MED ORDER — SODIUM CHLORIDE 0.9 % IV BOLUS: 500.0000 mL | Freq: Once | INTRAVENOUS | Status: DC

## 2020-04-28 MED ORDER — SODIUM CHLORIDE 0.9 % IV SOLN: Freq: Once | INTRAVENOUS | Status: DC

## 2020-04-28 MED ORDER — THIAMINE HCL 100 MG/ML IJ SOLN: 100.0000 mg | Freq: Every day | INTRAMUSCULAR | Status: DC

## 2020-04-28 MED ORDER — TACROLIMUS 0.5 MG PO CAPS
0.5000 mg | ORAL_CAPSULE | Freq: Two times a day (BID) | ORAL | Status: DC
  Administered 2020-04-29 – 2020-05-01 (×6): 0.5 mg via ORAL
  Filled 2020-04-28 (×8): qty 1

## 2020-04-28 MED ORDER — ONDANSETRON HCL 4 MG PO TABS: 4.0000 mg | ORAL_TABLET | Freq: Three times a day (TID) | ORAL | Status: DC | PRN

## 2020-04-28 MED ORDER — ZOLPIDEM TARTRATE 5 MG PO TABS: 5.0000 mg | ORAL_TABLET | Freq: Every evening | ORAL | Status: DC | PRN

## 2020-04-28 MED ORDER — LORAZEPAM 1 MG PO TABS
1.0000 mg | ORAL_TABLET | Freq: Once | ORAL | Status: AC
  Administered 2020-04-28: 1 mg via ORAL
  Filled 2020-04-28: qty 1

## 2020-04-28 MED ORDER — MYCOPHENOLATE MOFETIL 250 MG PO CAPS
500.0000 mg | ORAL_CAPSULE | Freq: Two times a day (BID) | ORAL | Status: DC
  Administered 2020-04-29 – 2020-05-01 (×6): 500 mg via ORAL
  Filled 2020-04-28 (×8): qty 2

## 2020-04-28 NOTE — H&P (Addendum)
History and Physical    Jevon Weddell [id number] DOB: Mar 21, 1961 DOA: 04/28/2020  PCP: Alroy Dust, L.Marlou Sa, MD Patient coming from: Home  Chief Complaint: Requesting alcohol detox, recent fall  HPI: Filomeno Frilot is a 59 y.o. male with medical history significant of alcoholic cirrhosis with resultant portal hypertension/gastropathy and varices status post liver transplantation in 2018, hypertension, hyperlipidemia, ethanol dependence presenting to the ED requesting alcohol detox.  Patient states he has been drinking heavily since his separation from his wife and due to other family problems.  He drinks several small "airplane" bottles of rum daily.  States for the past 1 week he was trying to cut down on his alcohol use on his own.  He has been very unsteady when walking and fell this morning.  He is not sure if he injured his head.  Not reporting any other injuries from the fall.  States he has been feeling a little depressed due to all the things that are going on in his life but denies any suicidal or homicidal ideation.  States he came into the hospital today to request help with detoxification.  He is interested in going to a rehab facility.  He has no other complaints.  Denies fevers, chills, cough, chest pain, nausea, vomiting, abdominal pain, or diarrhea.  Reports having chronic intermittent dyspnea on exertion.  ED Course: Afebrile.  Tremulous, tachycardic, hypertensive, and anxious.  Labs showing no leukocytosis.  Blood ethanol level 313 on initial labs, few hours later down to 92.  Bicarb 18, anion gap 16.  Blood glucose 135.  Creatinine 2.0, baseline 0.9.  LFTs normal.  UDS pending.  Screening SARS-CoV-2 PCR test pending.  Head CT negative for acute intracranial abnormality or skull fracture.  CT C-spine negative for acute fracture or subluxation.  Patient was given Ativan and 2 L normal saline boluses.  Review of Systems:  All systems reviewed and apart from history of presenting  illness, are negative.  Past Medical History:  Diagnosis Date  . Alcohol abuse 2013  . Alcoholic cirrhosis ([place]) [id number]  . Anemia 02/2016   in setting of GI blood loss, but MCV macrocytic.   . Coagulopathy ([place]) 05/2015  . Diverticulosis 09/2015  . Esophageal varices ([place]) 09/2015   small  . GERD (gastroesophageal reflux disease)   . Hyperlipidemia   . Hypertension   . Pneumonia   . Portal hypertension ([place]) 09/2015   with portal gastropathy  . Thrombocytopenia ([place]) 02/2016  . Tubular adenoma of colon 09/2015    Past Surgical History:  Procedure Laterality Date  . ESOPHAGOGASTRODUODENOSCOPY N/A 03/05/2016   Procedure: ESOPHAGOGASTRODUODENOSCOPY (EGD);  Surgeon: Ladene Artist, MD;  Location: [name];  Service: Endoscopy;  Laterality: N/A;  . ESOPHAGOGASTRODUODENOSCOPY N/A 04/29/2017   Procedure: ESOPHAGOGASTRODUODENOSCOPY (EGD);  Surgeon: Milus Banister, MD;  Location: [name];  Service: Endoscopy;  Laterality: N/A;  . ESOPHAGOGASTRODUODENOSCOPY (EGD) WITH PROPOFOL N/A 03/29/2017   Procedure: ESOPHAGOGASTRODUODENOSCOPY (EGD) WITH PROPOFOL;  Surgeon: Doran Stabler, MD;  Location: [facility];  Service: Endoscopy;  Laterality: N/A;  . IR PARACENTESIS  05/15/2017  . IR PARACENTESIS  06/11/2017  . IR PARACENTESIS  06/26/2017  . IR PARACENTESIS  07/10/2017  . IR PARACENTESIS  08/16/2017  . IR PARACENTESIS  09/14/2017  . IR THORACENTESIS ASP PLEURAL SPACE W/IMG GUIDE  06/28/2017  . LIVER TRANSPLANT    . testicle prosthesis       reports that he has never smoked. He has never used smokeless tobacco. He reports previous alcohol  use. He reports that he does not use drugs.  Allergies  Allergen Reactions  . Xifaxan [Rifaximin] Swelling and Other (See Comments)    Reaction:  All over body swelling     Family History  Problem Relation Age of Onset  . Diabetes Father   . Parkinson's disease Father   . Diabetes Paternal Aunt   . Glaucoma Mother     Prior to Admission  medications   Medication Sig Start Date End Date Taking? Authorizing Provider  Acetaminophen (TYLENOL) 325 MG CAPS Take 650 mg by mouth daily.   Yes [provider]  aspirin 81 MG chewable tablet Chew 81 mg by mouth daily.   Yes [provider]  betamethasone, augmented, (DIPROLENE) 0.05 % gel Apply 1 application topically 2 (two) times daily.  04/13/20  Yes [provider]  bismuth subsalicylate (PEPTO BISMOL) 262 MG chewable tablet Chew 262-524 mg by mouth 3 (three) times daily as needed for indigestion.   Yes [provider]  carvedilol (COREG) 25 MG tablet Take 1 tablet (25 mg total) by mouth 2 (two) times a day. Patient taking differently: Take 25 mg by mouth daily.  07/13/19  Yes Vann, Jessica U, DO  gabapentin (NEURONTIN) 300 MG capsule Take 300 mg by mouth 2 (two) times daily. 04/21/20  Yes [provider]  insulin regular (NOVOLIN R) 100 units/mL injection Inject 3-4 Units into the skin See admin instructions. As needed. 01/15/18  Yes [provider]  mycophenolate (CELLCEPT) 250 MG capsule Take 500 mg by mouth every 12 (twelve) hours. 05/01/19 04/30/20 Yes [provider]  pantoprazole (PROTONIX) 40 MG tablet Take 40 mg by mouth daily. 04/05/20  Yes [provider]  tacrolimus (PROGRAF) 0.5 MG capsule Take 0.5 mg by mouth every 12 (twelve) hours.  06/10/19 06/09/20 Yes [provider]  doxycycline (VIBRA-TABS) 100 MG tablet Take 1 tablet (100 mg total) by mouth every 12 (twelve) hours. Patient not taking: Reported on 04/28/2020 07/13/19   Geradine Girt, DO    Physical Exam: Vitals:   04/28/20 2205 04/28/20 2215 04/28/20 2324 04/29/20 0000  BP: (!) 183/108 (!) 187/99 (!) 185/92   Pulse: (!) 116 (!) 113 (!) 110 (!) 103  Resp:   17   Temp:   98 F (36.7 C)   TempSrc:      SpO2: 97% 100% 100% 100%  Weight:   72.9 kg   Height:   5\' 4"  (1.626 m)     Physical Exam  Constitutional: He is oriented to person, place,  and time. He appears well-developed and well-nourished.  Tremulous  HENT:  Head: Normocephalic.  Eyes: Right eye exhibits no discharge. Left eye exhibits no discharge.  Cardiovascular: Regular rhythm and intact distal pulses.  Tachycardic  Pulmonary/Chest: Effort normal. No respiratory distress. He has no wheezes. He has no rales.  Abdominal: Soft. Bowel sounds are normal. He exhibits distension. There is no abdominal tenderness. There is no rebound and no guarding.  Slight abdominal distention  Musculoskeletal:     Cervical back: Neck supple.     Comments: Trace pedal edema  Neurological: He is alert and oriented to person, place, and time.  Skin: Skin is warm and dry.  Psychiatric:  Anxious    Labs on Admission: I have personally reviewed following labs and imaging studies  CBC: Recent Labs  Lab 04/28/20 1215  WBC 4.5  NEUTROABS 3.0  HGB 13.5  HCT 40.4  MCV 105.5*  PLT 123XX123*   Basic Metabolic  Panel: Recent Labs  Lab 04/28/20 1215  NA 139  K 4.8  CL 105  CO2 18*  GLUCOSE 135*  BUN 20  CREATININE 2.01*  CALCIUM 8.7*   GFR: Estimated Creatinine Clearance: 37.1 mL/min (A) (by C-G formula based on SCr of 2.01 mg/dL (H)). Liver Function Tests: Recent Labs  Lab 04/28/20 1215  AST 34  ALT 18  ALKPHOS 53  BILITOT 0.6  PROT 6.7  ALBUMIN 3.7   No results for input(s): LIPASE, AMYLASE in the last 168 hours. No results for input(s): AMMONIA in the last 168 hours. Coagulation Profile: No results for input(s): INR, PROTIME in the last 168 hours. Cardiac Enzymes: No results for input(s): CKTOTAL, CKMB, CKMBINDEX, TROPONINI in the last 168 hours. BNP (last 3 results) No results for input(s): PROBNP in the last 8760 hours. HbA1C: No results for input(s): HGBA1C in the last 72 hours. CBG: Recent Labs  Lab 04/28/20 1648  GLUCAP 148*   Lipid Profile: No results for input(s): CHOL, HDL, LDLCALC, TRIG, CHOLHDL, LDLDIRECT in the last 72 hours. Thyroid Function  Tests: No results for input(s): TSH, T4TOTAL, FREET4, T3FREE, THYROIDAB in the last 72 hours. Anemia Panel: No results for input(s): VITAMINB12, FOLATE, FERRITIN, TIBC, IRON, RETICCTPCT in the last 72 hours. Urine analysis:    Component Value Date/Time   COLORURINE AMBER (A) 07/09/2019 0155   APPEARANCEUR HAZY (A) 07/09/2019 0155   LABSPEC 1.024 07/09/2019 0155   PHURINE 5.0 07/09/2019 0155   GLUCOSEU NEGATIVE 07/09/2019 0155   HGBUR MODERATE (A) 07/09/2019 0155   BILIRUBINUR NEGATIVE 07/09/2019 0155   KETONESUR 5 (A) 07/09/2019 0155   PROTEINUR 100 (A) 07/09/2019 0155   UROBILINOGEN 0.2 11/22/2012 0856   NITRITE NEGATIVE 07/09/2019 0155   LEUKOCYTESUR NEGATIVE 07/09/2019 0155    Radiological Exams on Admission: CT HEAD WO CONTRAST  Result Date: 04/28/2020 CLINICAL DATA:  Fall today. Alcoholic patient uncertain of events. Ataxia and difficulty walking. EXAM: CT HEAD WITHOUT CONTRAST TECHNIQUE: Contiguous axial images were obtained from the base of the skull through the vertex without intravenous contrast. COMPARISON:  Head CT 07/08/2019 FINDINGS: Brain: Mild generalized atrophy, stable from prior. No intracranial hemorrhage, mass effect, or midline shift. No hydrocephalus. The basilar cisterns are patent. No evidence of territorial infarct or acute ischemia. No extra-axial or intracranial fluid collection. Vascular: Atherosclerosis of skullbase vasculature without hyperdense vessel or abnormal calcification. Skull: No fracture or focal lesion. Sinuses/Orbits: Unchanged opacification of posterior right ethmoid air cells from prior exam. Paranasal sinuses otherwise clear. The mastoid air cells are well aerated. No acute orbital abnormality. Other: Mild left frontal scalp soft tissue thickening which is unchanged from prior exam and likely scarring. IMPRESSION: No acute intracranial abnormality. No skull fracture. Electronically Signed   By: Keith Rake M.D.   On: 04/28/2020 20:31   CT  Cervical Spine Wo Contrast  Result Date: 04/28/2020 CLINICAL DATA:  Head trauma, headache Neck trauma, midline tenderness (Age [age]) alcoholic headache after fall Fall today. Alcoholic patient uncertain of events. Ataxia and difficulty walking. EXAM: CT CERVICAL SPINE WITHOUT CONTRAST TECHNIQUE: Multidetector CT imaging of the cervical spine was performed without intravenous contrast. Multiplanar CT image reconstructions were also generated. COMPARISON:  None. FINDINGS: Alignment: Normal. Skull base and vertebrae: No acute fracture. Vertebral body heights are maintained. The dens and skull base are intact. Underlying osteopenia/osteoporosis. Soft tissues and spinal canal: No prevertebral fluid or swelling. No visible canal hematoma. Disc levels:  Disc space narrowing at C5-C6 and C7-T1. Upper chest: No acute findings.  Other: Carotid calcifications. IMPRESSION: Mild degenerative change in the cervical spine without acute fracture or subluxation. Electronically Signed   By: Keith Rake M.D.   On: 04/28/2020 20:35    EKG: Independently reviewed.  Sinus tachycardia, heart rate 104.  QTc 520.  RBBB.  No significant change since prior tracing.  Assessment/Plan Principal Problem:   Alcohol withdrawal ([place]) Active Problems:   HTN (hypertension)   AKI (acute kidney injury) ([place])   Liver cirrhosis ([facility])   Depression   Acute alcohol withdrawal: Patient is tremulous, tachycardic, hypertensive, and anxious.  Reports drinking several small bottles of rum daily.  He has been trying to cut down on his drinking for the past 1 week. -Admit to stepdown unit.  Order CIWA protocol; Ativan as needed.  Thiamine, folate, and multivitamin.  AKI: Creatinine 2.0, baseline 0.9.  Suspect related to decreased p.o. intake in the setting of ethanol abuse. -IV fluid hydration.  Monitor renal function and urine output.  Order renal ultrasound.  Check urine sodium, creatinine.  Mild high anion gap metabolic acidosis:  Suspect related to ethanol abuse. Bicarb 18, anion gap 16.   -Continue IV fluid hydration and monitor BMP.  Alcoholic liver cirrhosis/history of liver transplant: No signs of hepatic encephalopathy at this time.  Abdomen appears distended but nontender to palpation.  No fever or leukocytosis. -Will order ultrasound to assess degree of ascites, may need paracentesis during this hospitalization.  Continue home CellCept and Prograf.  Hypertension: Blood pressure elevated in the setting of acute alcohol withdrawal.  Patient was given a dose of labetalol in the ED. -Resume home antihypertensives  QT prolongation EKG: Keep potassium above 4 and magnesium above 2.  Avoid QT prolonging drugs if possible.  Continue cardiac monitoring and repeat EKG in a.m.  Depression: Patient reports feeling depressed due to recently being separated from his wife and other family problems.  Denies suicidal or homicidal ideation. -Ensure follow-up with psychiatry  Insulin-dependent diabetes: Check A1c.  Sliding scale insulin and CBG checks.  DVT prophylaxis: Subcutaneous heparin Code Status: Full code Family Communication: No family available at this time. Disposition Plan: Status is: Inpatient  Remains inpatient appropriate because:Inpatient level of care appropriate due to severity of illness   Dispo: The patient is from: Home              Anticipated d/c is to: Rehab facility              Anticipated d/c date is: 3 days              Patient currently is not medically stable to d/c.  The medical decision making on this patient was of high complexity and the patient is at high risk for clinical deterioration, therefore this is a level 3 visit.  Shela Leff MD [facility]  If 7PM-7AM, please contact night-coverage [email or web address]  04/29/2020, 12:57 AM

## 2020-04-28 NOTE — Progress Notes (Signed)
Transition of Care [facility]) - Emergency Department Mini Assessment   Patient Details  Name: Thomas [name] MRN: [id number] Date of Birth: 07-May-1961  [facility] of [facility]) CM/SW Contact:    Vergie Living, LCSW Phone Number: 04/28/2020, 10:21 PM   Clinical Narrative:    ED Mini Assessment: What brought you to the Emergency Department? : Requesting detox after fall  Barriers to Discharge: Continued Medical Work up     Means of departure: Car  Interventions which prevented an admission or readmission: Patient counseling, Other (must enter comment)(Substance Use Resources)    Patient Contact and Communications Key Contact 1: [facility]   Spoke with: RN staff Contact Date: 04/28/20,   Contact time: 2209 Contact Phone Number: [phone] Call outcome: Unable to tell me if there is bed availability. Gave Pt # to call after 9 am  Patient states their goals for this hospitalization and ongoing recovery are:: Get sober CMS Medicare.gov Compare Post Acute Care list provided to:: Patient Choice offered to / list presented to : Patient  Admission diagnosis:  detox/hx of liver transplant Patient Active Problem List   Diagnosis Date Noted  . Cellulitis 07/11/2019  . Dehydration with hyponatremia 07/08/2019  . Diabetes mellitus type 2 in nonobese ([place]) 07/08/2019  . Essential hypertension 07/08/2019  . Diabetic gastroparesis ([place]) 07/08/2019  . H/O liver transplant ([place]) 07/08/2019  . GI bleed 09/14/2017  . Acute hepatic encephalopathy 09/04/2017  . Acute upper GI bleed 06/13/2017  . HCAP (healthcare-associated pneumonia) 04/27/2017  . Acute kidney injury superimposed on CKD ([place]) 03/28/2017  . Hypoalbuminemia 03/28/2017  . Edema 03/28/2017  . S/P thoracentesis   . Symptomatic anemia   . Dyspnea 03/03/2017  . Hypomagnesemia 11/26/2016  . Acute respiratory failure with hypoxia ([place]) 11/24/2016  . Pleural effusion associated with hepatic disorder 11/24/2016  . Hypokalemia  11/24/2016  . SBP (spontaneous bacterial peritonitis) ([place]) 05/09/2016  . Severe sepsis ([place]) 05/09/2016  . Decompensation of cirrhosis of liver ([place]) 05/07/2016  . Hypotension 05/07/2016  . Ascites due to alcoholic cirrhosis ([place]) [id number]  . Localized edema 04/17/2016  . Alcoholic cirrhosis of liver with ascites ([place])   . End stage liver disease ([place])   . Upper GI bleed 03/05/2016  . Coagulopathy ([place]) 03/05/2016  . Hepatic encephalopathy ([place]) 03/05/2016  . Macrocytic anemia 03/05/2016  . Thrombocytopenia ([place]) 03/05/2016  . Esophageal varices ([place]) 03/05/2016  . Iron deficiency anemia due to chronic blood loss 03/05/2016  . Idiopathic esophageal varices without bleeding ([place])   . Portal hypertensive gastropathy ([place])    PCP:  Alroy Dust, L.Marlou Sa, MD Pharmacy:   [facility] [place], [place] - [address] [place] [address] Phone: [phone] Fax: [phone]

## 2020-04-28 NOTE — Social Work (Signed)
CSW met with Pt at bedside. CSW provided substance use education and resources for both [place] and [place].

## 2020-04-28 NOTE — ED Notes (Signed)
Social worker @ bedside.

## 2020-04-28 NOTE — ED Notes (Signed)
Pt transported to CT ?

## 2020-04-28 NOTE — ED Triage Notes (Signed)
Pt  Here from home requesting detox from [facility] , drinks 4 to 5 rum airplane bottles daily recently due to depression and going through a divorce , pt is a liver transplant pt , no si or hi

## 2020-04-28 NOTE — ED Provider Notes (Signed)
[facility] EMERGENCY DEPARTMENT Provider Note   CSN: [id number] Arrival date & time: 04/28/20  1120     History No chief complaint on file.   Thomas [name] is a 59 y.o. male.  HPI  Patient is a 59 year old male with a history of alcohol abuse, alcoholic cirrhosis, anemia, esophageal varices that are small, GERD, HTN, HLD, portal hypertension and s/p liver transplant 2 years ago   Patient presents today with chief complaint of fatigue, recent fall this morning, headache, anxiety, tremulousness and mild nausea.  Patient states he has been drinking heavily since his separation from his wife and other stressors.  He states that he drinks several airplane bottles of rum daily.  He states that over the past 1 week he has been using approximately 10 airplane bottles a week.  He denies any other alcohol use and denies any other drug use.  He states that he has been unsteady and has had frequent falls.  He states when he fell this morning he is unsure whether he hit his head.  He denies any pain other than pain in his neck.  He states that he feels anxious and depressed but denies SI, HI.  He is requesting placement in a rehab facility.  He denies fevers, chills, vomiting, chest pain or diarrhea.    Past Medical History:  Diagnosis Date  . Alcohol abuse 2013  . Alcoholic cirrhosis ([place]) [id number]  . Anemia 02/2016   in setting of GI blood loss, but MCV macrocytic.   . Coagulopathy ([place]) 05/2015  . Diverticulosis 09/2015  . Esophageal varices ([place]) 09/2015   small  . GERD (gastroesophageal reflux disease)   . Hyperlipidemia   . Hypertension   . Pneumonia   . Portal hypertension ([place]) 09/2015   with portal gastropathy  . Thrombocytopenia ([place]) 02/2016  . Tubular adenoma of colon 09/2015    Patient Active Problem List   Diagnosis Date Noted  . AKI (acute kidney injury) ([place]) 04/29/2020  . Liver cirrhosis ([place]) 04/29/2020  . Depression 04/29/2020  . Alcohol withdrawal  ([place]) 04/28/2020  . Cellulitis 07/11/2019  . Dehydration with hyponatremia 07/08/2019  . Diabetes mellitus type 2 in nonobese ([place]) 07/08/2019  . HTN (hypertension) 07/08/2019  . Diabetic gastroparesis ([place]) 07/08/2019  . H/O liver transplant ([place]) 07/08/2019  . GI bleed 09/14/2017  . Acute hepatic encephalopathy 09/04/2017  . Acute upper GI bleed 06/13/2017  . HCAP (healthcare-associated pneumonia) 04/27/2017  . Acute kidney injury superimposed on CKD ([place]) 03/28/2017  . Hypoalbuminemia 03/28/2017  . Edema 03/28/2017  . S/P thoracentesis   . Symptomatic anemia   . Dyspnea 03/03/2017  . Hypomagnesemia 11/26/2016  . Acute respiratory failure with hypoxia ([place]) 11/24/2016  . Pleural effusion associated with hepatic disorder 11/24/2016  . Hypokalemia 11/24/2016  . SBP (spontaneous bacterial peritonitis) ([place]) 05/09/2016  . Severe sepsis ([place]) 05/09/2016  . Decompensation of cirrhosis of liver ([place]) 05/07/2016  . Hypotension 05/07/2016  . Ascites due to alcoholic cirrhosis ([place]) [id number]  . Localized edema 04/17/2016  . Alcoholic cirrhosis of liver with ascites ([place])   . End stage liver disease ([place])   . Upper GI bleed 03/05/2016  . Coagulopathy ([place]) 03/05/2016  . Hepatic encephalopathy ([place]) 03/05/2016  . Macrocytic anemia 03/05/2016  . Thrombocytopenia ([place]) 03/05/2016  . Esophageal varices ([place]) 03/05/2016  . Iron deficiency anemia due to chronic blood loss 03/05/2016  . Idiopathic esophageal varices without bleeding ([place])   . Portal hypertensive gastropathy ([place])  Past Surgical History:  Procedure Laterality Date  . ESOPHAGOGASTRODUODENOSCOPY N/A 03/05/2016   Procedure: ESOPHAGOGASTRODUODENOSCOPY (EGD);  Surgeon: Ladene Artist, MD;  Location: [name];  Service: Endoscopy;  Laterality: N/A;  . ESOPHAGOGASTRODUODENOSCOPY N/A 04/29/2017   Procedure: ESOPHAGOGASTRODUODENOSCOPY (EGD);  Surgeon: Milus Banister, MD;  Location: [name];  Service: Endoscopy;   Laterality: N/A;  . ESOPHAGOGASTRODUODENOSCOPY (EGD) WITH PROPOFOL N/A 03/29/2017   Procedure: ESOPHAGOGASTRODUODENOSCOPY (EGD) WITH PROPOFOL;  Surgeon: Doran Stabler, MD;  Location: [facility];  Service: Endoscopy;  Laterality: N/A;  . IR PARACENTESIS  05/15/2017  . IR PARACENTESIS  06/11/2017  . IR PARACENTESIS  06/26/2017  . IR PARACENTESIS  07/10/2017  . IR PARACENTESIS  08/16/2017  . IR PARACENTESIS  09/14/2017  . IR THORACENTESIS ASP PLEURAL SPACE W/IMG GUIDE  06/28/2017  . LIVER TRANSPLANT    . testicle prosthesis         Family History  Problem Relation Age of Onset  . Diabetes Father   . Parkinson's disease Father   . Diabetes Paternal Aunt   . Glaucoma Mother     Social History   Tobacco Use  . Smoking status: Never Smoker  . Smokeless tobacco: Never Used  Substance Use Topics  . Alcohol use: Not Currently    Alcohol/week: 0.0 standard drinks    Comment: Remote h/o heavy use; Last drink has "been a while" - slipped once in mid-May  . Drug use: No    Home Medications Prior to Admission medications   Medication Sig Start Date End Date Taking? Authorizing Provider  Acetaminophen (TYLENOL) 325 MG CAPS Take 650 mg by mouth daily.   Yes [provider]  aspirin 81 MG chewable tablet Chew 81 mg by mouth daily.   Yes [provider]  betamethasone, augmented, (DIPROLENE) 0.05 % gel Apply 1 application topically 2 (two) times daily.  04/13/20  Yes [provider]  bismuth subsalicylate (PEPTO BISMOL) 262 MG chewable tablet Chew 262-524 mg by mouth 3 (three) times daily as needed for indigestion.   Yes [provider]  carvedilol (COREG) 25 MG tablet Take 1 tablet (25 mg total) by mouth 2 (two) times a day. Patient taking differently: Take 25 mg by mouth daily.  07/13/19  Yes Vann, Jessica U, DO  gabapentin (NEURONTIN) 300 MG capsule Take 300 mg by mouth 2 (two) times daily. 04/21/20  Yes [provider]  insulin regular (NOVOLIN R) 100  units/mL injection Inject 3-4 Units into the skin See admin instructions. As needed. 01/15/18  Yes [provider]  mycophenolate (CELLCEPT) 250 MG capsule Take 500 mg by mouth every 12 (twelve) hours. 05/01/19 04/30/20 Yes [provider]  pantoprazole (PROTONIX) 40 MG tablet Take 40 mg by mouth daily. 04/05/20  Yes [provider]  tacrolimus (PROGRAF) 0.5 MG capsule Take 0.5 mg by mouth every 12 (twelve) hours.  06/10/19 06/09/20 Yes [provider]  doxycycline (VIBRA-TABS) 100 MG tablet Take 1 tablet (100 mg total) by mouth every 12 (twelve) hours. Patient not taking: Reported on 04/28/2020 07/13/19   Geradine Girt, DO    Allergies    Xifaxan [rifaximin]  Review of Systems   Review of Systems  Constitutional: Positive for fatigue. Negative for chills and fever.  HENT: Negative for congestion.   Eyes: Negative for pain.  Respiratory: Negative for cough and shortness of breath.   Cardiovascular: Negative for chest pain and leg swelling.  Gastrointestinal: Positive for nausea. Negative for abdominal pain, constipation and vomiting.  Genitourinary: Negative for dysuria.  Musculoskeletal: Positive for myalgias.       Neck pain  Skin: Negative for rash.  Neurological: Positive for headaches. Negative for dizziness.  Psychiatric/Behavioral: The patient is nervous/anxious.     Physical Exam Updated Vital Signs BP (!) 189/95   Pulse 97   Temp 98 F (36.7 C)   Resp (!) 24   Ht 5\' 4"  (1.626 m)   Wt 72.9 kg   SpO2 96%   BMI 27.59 kg/m   Physical Exam Vitals and nursing note reviewed.  Constitutional:      Comments: Patient is anxious appearing 59 year old gentleman, pleasant, able answer questions appropriately follow commands  HENT:     Head: Normocephalic and atraumatic.     Nose: Nose normal.     Mouth/Throat:     Mouth: Mucous membranes are moist.  Eyes:     General: No scleral icterus. Cardiovascular:     Rate and Rhythm: Normal rate and  regular rhythm.     Pulses: Normal pulses.     Heart sounds: Normal heart sounds.  Pulmonary:     Effort: Pulmonary effort is normal. No respiratory distress.     Breath sounds: No wheezing.  Abdominal:     Palpations: Abdomen is soft.     Tenderness: There is no abdominal tenderness.     Comments: Nontender, protuberant abdomen, remote surgical scar over abdomen from prior liver transplant  Musculoskeletal:     Cervical back: Normal range of motion.     Right lower leg: No edema.     Left lower leg: No edema.  Skin:    General: Skin is warm and dry.     Capillary Refill: Capillary refill takes less than 2 seconds.  Neurological:     Mental Status: He is alert. Mental status is at baseline.     Comments: Patient is diffusely tremulous, no notable asterixis Cranial nerves intact, sensation intact all 4 extremities, strength intact throughout.  Coordination somewhat impaired by tremors however intact finger-nose and heel shin.  Psychiatric:        Mood and Affect: Mood normal.        Behavior: Behavior normal.     ED Results / Procedures / Treatments   Labs (all labs ordered are listed, but only abnormal results are displayed) Labs Reviewed  COMPREHENSIVE METABOLIC PANEL - Abnormal; Notable for the following components:      Result Value   CO2 18 (*)    Glucose, Bld 135 (*)    Creatinine, Ser 2.01 (*)    Calcium 8.7 (*)    GFR calc non Af Amer 36 (*)    GFR calc Af Amer 41 (*)    Anion gap 16 (*)    All other components within normal limits  ETHANOL - Abnormal; Notable for the following components:   Alcohol, Ethyl (B) 313 (*)    All other components within normal limits  CBC WITH DIFFERENTIAL/PLATELET - Abnormal; Notable for the following components:   RBC 3.83 (*)    MCV 105.5 (*)    MCH 35.2 (*)    Platelets 122 (*)    All other components within normal limits  ETHANOL - Abnormal; Notable for the following components:   Alcohol, Ethyl (B) 92 (*)    All other  components within normal limits  CBG MONITORING, ED - Abnormal; Notable for the following components:   Glucose-Capillary 148 (*)    All other components within normal limits  SARS CORONAVIRUS  2 BY RT PCR (HOSPITAL ORDER, [facility])  RAPID URINE DRUG SCREEN, HOSP PERFORMED  MAGNESIUM  PHOSPHORUS  BASIC METABOLIC PANEL  SODIUM, URINE, RANDOM  CREATININE, URINE, RANDOM    EKG EKG Interpretation  Date/Time:  Wednesday Apr 28 2020 12:41:02 EDT Ventricular Rate:  104 PR Interval:  152 QRS Duration: 118 QT Interval:  396 QTC Calculation: 520 R Axis:   24 Text Interpretation: Sinus tachycardia Right bundle branch block Abnormal ECG When compared with ECG of 07/08/2019, No significant change was found Confirmed by Delora Fuel ([id number]) on 04/28/2020 11:42:04 PM   Radiology CT HEAD WO CONTRAST  Result Date: 04/28/2020 CLINICAL DATA:  Fall today. Alcoholic patient uncertain of events. Ataxia and difficulty walking. EXAM: CT HEAD WITHOUT CONTRAST TECHNIQUE: Contiguous axial images were obtained from the base of the skull through the vertex without intravenous contrast. COMPARISON:  Head CT 07/08/2019 FINDINGS: Brain: Mild generalized atrophy, stable from prior. No intracranial hemorrhage, mass effect, or midline shift. No hydrocephalus. The basilar cisterns are patent. No evidence of territorial infarct or acute ischemia. No extra-axial or intracranial fluid collection. Vascular: Atherosclerosis of skullbase vasculature without hyperdense vessel or abnormal calcification. Skull: No fracture or focal lesion. Sinuses/Orbits: Unchanged opacification of posterior right ethmoid air cells from prior exam. Paranasal sinuses otherwise clear. The mastoid air cells are well aerated. No acute orbital abnormality. Other: Mild left frontal scalp soft tissue thickening which is unchanged from prior exam and likely scarring. IMPRESSION: No acute intracranial abnormality. No skull  fracture. Electronically Signed   By: Keith Rake M.D.   On: 04/28/2020 20:31   CT Cervical Spine Wo Contrast  Result Date: 04/28/2020 CLINICAL DATA:  Head trauma, headache Neck trauma, midline tenderness (Age 9891111) alcoholic headache after fall Fall today. Alcoholic patient uncertain of events. Ataxia and difficulty walking. EXAM: CT CERVICAL SPINE WITHOUT CONTRAST TECHNIQUE: Multidetector CT imaging of the cervical spine was performed without intravenous contrast. Multiplanar CT image reconstructions were also generated. COMPARISON:  None. FINDINGS: Alignment: Normal. Skull base and vertebrae: No acute fracture. Vertebral body heights are maintained. The dens and skull base are intact. Underlying osteopenia/osteoporosis. Soft tissues and spinal canal: No prevertebral fluid or swelling. No visible canal hematoma. Disc levels:  Disc space narrowing at C5-C6 and C7-T1. Upper chest: No acute findings. Other: Carotid calcifications. IMPRESSION: Mild degenerative change in the cervical spine without acute fracture or subluxation. Electronically Signed   By: Keith Rake M.D.   On: 04/28/2020 20:35    Procedures .Critical Care Performed by: Tedd Sias, PA Authorized by: Tedd Sias, PA   Critical care provider statement:    Critical care time (minutes):  35   Critical care time was exclusive of:  Separately billable procedures and treating other patients and teaching time   Critical care was necessary to treat or prevent imminent or life-threatening deterioration of the following conditions: Renal injury, AKI, tachycardia, acute alcohol withdrawals.   Critical care was time spent personally by me on the following activities:  Discussions with consultants, evaluation of patient's response to treatment, examination of patient, review of old charts, re-evaluation of patient's condition, pulse oximetry, ordering and review of radiographic studies, ordering and review of laboratory  studies and ordering and performing treatments and interventions   I assumed direction of critical care for this patient from another provider in my specialty: no     (including critical care time)  Medications Ordered in ED Medications  carvedilol (COREG) tablet 25 mg (has  no administration in time range)  gabapentin (NEURONTIN) capsule 300 mg (300 mg Oral Given 04/29/20 0033)  mycophenolate (CELLCEPT) capsule 500 mg (500 mg Oral Given 04/29/20 0035)  pantoprazole (PROTONIX) EC tablet 40 mg (has no administration in time range)  tacrolimus (PROGRAF) capsule 0.5 mg (0.5 mg Oral Given 04/29/20 0034)  insulin aspart (novoLOG) injection 0-5 Units (0 Units Subcutaneous Not Given 04/28/20 2322)  insulin aspart (novoLOG) injection 0-15 Units (has no administration in time range)  aspirin chewable tablet 81 mg (has no administration in time range)  LORazepam (ATIVAN) tablet 1-4 mg ( Oral See Alternative 04/29/20 0033)    Or  LORazepam (ATIVAN) injection 1-4 mg (1 mg Intravenous Given 04/29/20 0033)  thiamine tablet 100 mg (has no administration in time range)    Or  thiamine (B-1) injection 100 mg (has no administration in time range)  folic acid (FOLVITE) tablet 1 mg (has no administration in time range)  multivitamin with minerals tablet 1 tablet (has no administration in time range)  0.9 %  sodium chloride infusion ( Intravenous New Bag/Given 04/29/20 0041)  heparin injection 5,000 Units (5,000 Units Subcutaneous Not Given 04/29/20 0102)  LORazepam (ATIVAN) tablet 1 mg (1 mg Oral Given 04/28/20 2037)  sodium chloride 0.9 % bolus 1,000 mL (0 mLs Intravenous Stopped 04/29/20 0036)  sodium chloride 0.9 % bolus 1,000 mL (0 mLs Intravenous Stopped 04/29/20 0036)  labetalol (NORMODYNE) injection 10 mg (10 mg Intravenous Given 04/29/20 0034)    ED Course  I have reviewed the triage vital signs and the nursing notes.  Pertinent labs & imaging results that were available during my care of the patient were  reviewed by me and considered in my medical decision making (see chart for details).  Patient is 59 year old male history of alcoholism recent fall that occurred a questionable time last night.  Patient has likely several concurrent issues at this time all related to his alcoholism.  He has a significant AKI with creatinine that has doubled from his baseline.  His physical exam with concerning findings of midline neck tenderness.  Will obtain CT imaging of neck and head to rule out intercranial bleed or cervical spine fracture.  He has significant risk factors given that he is an alcoholic and that he does not report his fall.  He was found by his sister in the hotel room where he is currently staying.  We will obtain alcohol level, basic labs, and place patient on CIWA protocol.  Per nursing staff patient CIWA score is 4 however he is significantly tremulous tachycardic and anxious.  My exam.  His tachycardia reached a level of 124 at which time I provided patient with 1 mg of Ativan.  His tachycardia improved somewhat to 110s.  He is received 1 L normal saline at this time   Clinical Course as of Apr 29 122  Wed Apr 28, 2020  2135 CT C spine and head reviewed by myself.Mild degenerative change in the cervical spine without acute fracture or subluxation. No acute intracranial abnormality. No skull fracture.   [WF]  2307 CMP was notable doubling of creatinine--last comparison lab is 9 months ago.  He has well-established baseline of creatinine of 1  Creatinine(!): 2.01 [WF]  2308 Mild anion gap elevation at 16.  This is likely secondary to alcoholic ketotic state.  No transaminitis.  Glucose very mildly elevated 135.   [WF]  2309 CBC without leukocytosis.  No significant anemia.   [WF]  2310 Patient has right bundle  branch block on EKG.  This is similar to prior EKGs.  He is tachycardic however no acute ischemia.  He has no chest pain.   [WF]    Clinical Course User Index [WF] Tedd Sias, [place]   On my reevaluation patient continues to be severely tachycardic is now hypertensive denies any chest pain or shortness of breath.  I will provide patient with another liter of normal saline and continue CIWA protocol.   Patient's home medications ordered.  Placed on sliding scale insulin.   I discussed this case with my attending physician who cosigned this note including patient's presenting symptoms, physical exam, and planned diagnostics and interventions. Attending physician stated agreement with plan or made changes to plan which were implemented.   Attending physician assessed patient at bedside.  The patient appears reasonably stabilized for admission considering the current resources, flow, and capabilities available in the ED at this time, and I doubt any other [facility] requiring further screening and/or treatment in the ED prior to admission.  Patient mated to internal medicine Dr. Marlowe Sax who recommended 10 mg of labetalol--his CIWA score has increased to 6.  Will provide patient with additional dose of Ativan.  I discussed with him reason for his admission today.  He is understanding of plan.  Patient will be admitted for AKI, alcohol withdrawal symptoms and tachycardia he has received his full second dose of fluids at this time.   MDM Rules/Calculators/A&P                      Final Clinical Impression(s) / ED Diagnoses Final diagnoses:  AKI (acute kidney injury) ([place])  Alcohol abuse  Ascites    Rx / DC Orders ED Discharge Orders    None       Tedd Sias, [place] 04/29/20 0123    Margette Fast, MD 04/29/20 1615

## 2020-04-29 ENCOUNTER — Inpatient Hospital Stay (HOSPITAL_COMMUNITY): Payer: Medicare Other

## 2020-04-29 ENCOUNTER — Other Ambulatory Visit (HOSPITAL_COMMUNITY): Payer: Medicare Other

## 2020-04-29 ENCOUNTER — Encounter (HOSPITAL_COMMUNITY): Payer: Self-pay | Admitting: Internal Medicine

## 2020-04-29 DIAGNOSIS — K7030 Alcoholic cirrhosis of liver without ascites: Secondary | ICD-10-CM

## 2020-04-29 DIAGNOSIS — F329 Major depressive disorder, single episode, unspecified: Secondary | ICD-10-CM

## 2020-04-29 DIAGNOSIS — K7460 Unspecified cirrhosis of liver: Secondary | ICD-10-CM

## 2020-04-29 DIAGNOSIS — I10 Essential (primary) hypertension: Secondary | ICD-10-CM

## 2020-04-29 DIAGNOSIS — F32A Depression, unspecified: Secondary | ICD-10-CM

## 2020-04-29 DIAGNOSIS — N179 Acute kidney failure, unspecified: Secondary | ICD-10-CM

## 2020-04-29 LAB — BASIC METABOLIC PANEL
Anion gap: 9 (ref 5–15)
BUN: 17 mg/dL (ref 6–20)
CO2: 22 mmol/L (ref 22–32)
Calcium: 8 mg/dL — ABNORMAL LOW (ref 8.9–10.3)
Chloride: 108 mmol/L (ref 98–111)
Creatinine, Ser: 1.37 mg/dL — ABNORMAL HIGH (ref 0.61–1.24)
GFR calc Af Amer: >60 mL/min (ref >60)
GFR calc non Af Amer: 57 mL/min — ABNORMAL LOW (ref >60)
Glucose, Bld: 124 mg/dL — ABNORMAL HIGH (ref 70–99)
Potassium: 4.4 mmol/L (ref 3.5–5.1)
Sodium: 139 mmol/L (ref 135–145)

## 2020-04-29 LAB — GLUCOSE, CAPILLARY
Glucose-Capillary: 76 mg/dL (ref 70–99)
Glucose-Capillary: 150 mg/dL — ABNORMAL HIGH (ref 70–99)

## 2020-04-29 LAB — MAGNESIUM: Magnesium: 1.4 mg/dL — ABNORMAL LOW (ref 1.7–2.4)

## 2020-04-29 LAB — RAPID URINE DRUG SCREEN, HOSP PERFORMED
Amphetamines: NOT DETECTED
Barbiturates: NOT DETECTED
Benzodiazepines: NOT DETECTED
Cocaine: NOT DETECTED
Opiates: NOT DETECTED
Tetrahydrocannabinol: NOT DETECTED

## 2020-04-29 LAB — SODIUM, URINE, RANDOM: Sodium, Ur: 64 mmol/L

## 2020-04-29 LAB — CREATININE, URINE, RANDOM: Creatinine, Urine: 92.54 mg/dL

## 2020-04-29 LAB — CBG MONITORING, ED
Glucose-Capillary: 122 mg/dL — ABNORMAL HIGH (ref 70–99)
Glucose-Capillary: 170 mg/dL — ABNORMAL HIGH (ref 70–99)

## 2020-04-29 LAB — PHOSPHORUS: Phosphorus: 2.1 mg/dL — ABNORMAL LOW (ref 2.5–4.6)

## 2020-04-29 MED ORDER — FOLIC ACID 1 MG PO TABS
1.0000 mg | ORAL_TABLET | Freq: Every day | ORAL | Status: DC
  Administered 2020-04-29 – 2020-05-01 (×3): 1 mg via ORAL
  Filled 2020-04-29 (×3): qty 1

## 2020-04-29 MED ORDER — LORAZEPAM 1 MG PO TABS
1.0000 mg | ORAL_TABLET | ORAL | Status: DC | PRN
  Administered 2020-04-29: 1 mg via ORAL
  Administered 2020-04-30: 2 mg via ORAL
  Filled 2020-04-29: qty 2
  Filled 2020-04-29: qty 1

## 2020-04-29 MED ORDER — THIAMINE HCL 100 MG PO TABS
100.0000 mg | ORAL_TABLET | Freq: Every day | ORAL | Status: DC
  Administered 2020-04-29 – 2020-05-01 (×3): 100 mg via ORAL
  Filled 2020-04-29 (×3): qty 1

## 2020-04-29 MED ORDER — HEPARIN SODIUM (PORCINE) 5000 UNIT/ML IJ SOLN
5000.0000 [IU] | Freq: Three times a day (TID) | INTRAMUSCULAR | Status: DC
  Administered 2020-04-29 – 2020-05-01 (×6): 5000 [IU] via SUBCUTANEOUS
  Filled 2020-04-29 (×6): qty 1

## 2020-04-29 MED ORDER — ADULT MULTIVITAMIN W/MINERALS CH
1.0000 | ORAL_TABLET | Freq: Every day | ORAL | Status: DC
  Administered 2020-04-29 – 2020-05-01 (×3): 1 via ORAL
  Filled 2020-04-29 (×3): qty 1

## 2020-04-29 MED ORDER — THIAMINE HCL 100 MG/ML IJ SOLN: 100.0000 mg | Freq: Every day | INTRAMUSCULAR | Status: DC

## 2020-04-29 MED ORDER — ENOXAPARIN SODIUM 40 MG/0.4ML ~~LOC~~ SOLN
40.0000 mg | SUBCUTANEOUS | Status: DC
  Filled 2020-04-29: qty 0.4

## 2020-04-29 MED ORDER — ASPIRIN 81 MG PO CHEW
81.0000 mg | CHEWABLE_TABLET | Freq: Every day | ORAL | Status: DC
  Administered 2020-04-29 – 2020-05-01 (×3): 81 mg via ORAL
  Filled 2020-04-29 (×3): qty 1

## 2020-04-29 MED ORDER — LORAZEPAM 2 MG/ML IJ SOLN
1.0000 mg | INTRAMUSCULAR | Status: DC | PRN
  Administered 2020-04-29 – 2020-04-30 (×3): 1 mg via INTRAVENOUS
  Filled 2020-04-29 (×3): qty 1

## 2020-04-29 MED ORDER — SODIUM CHLORIDE 0.9 % IV SOLN: INTRAVENOUS | Status: DC

## 2020-04-29 MED ORDER — MAGNESIUM SULFATE 2 GM/50ML IV SOLN
2.0000 g | Freq: Once | INTRAVENOUS | Status: AC
  Administered 2020-04-29: 2 g via INTRAVENOUS
  Filled 2020-04-29: qty 50

## 2020-04-29 NOTE — ED Notes (Signed)
SDU Breakfast Ordered 

## 2020-04-29 NOTE — Progress Notes (Signed)
PROGRESS NOTE  Thomas [name] [id number] DOB: 1961/05/10 DOA: 04/28/2020 PCP: Alroy Dust, L.Marlou Sa, MD  Brief History   The patient is a 59 yr old man who presented to [facility] on 04/28/2020 stating that he was seeking alcohol rehab. The patient has a past medical history significant for liver transplant in 2018 with new cirrhosis of the liver with portal hypertension/gastropathy and varices. He also has hypertension, hyperlipidemia, and ethanol dependence. He states that he has begun drinking heavily again due to family stressors. He has felt depressed. He denies fevers, chills, cough, chest pain, nausea, vomiting, abdominal pain, or diarrhea.   He is tachycardic and a little hypertensive in the ED. He is being roomed in the ED pending a room upstairs.  Consultants  . None  Procedures  . None  Antibiotics   Anti-infectives (From admission, onward)   None    .  Subjective  The patient is lying quietly on the gurney. He states that he is feeling better.  Objective   Vitals:  Vitals:   04/29/20 0800 04/29/20 1023  BP: 134/79   Pulse: 98 95  Resp: (!) 24   Temp:    SpO2: 97%    Exam:  Constitutional:  . The patient is awake, alert, and oriented x 3. No acute distress. Eyes:  . pupils and irises appear normal . Normal lids and conjunctivae ENMT:  . grossly normal hearing  . Lips appear normal . external ears, nose appear normal . Oropharynx: mucosa, tongue,posterior pharynx appear normal Neck:  . neck appears normal, no masses, normal ROM, supple . no thyromegaly Respiratory:  . No increased work of breathing. . No wheezes, rales, or rhonchi . No tactile fremitus Cardiovascular:  . Regular rate and rhythm . No murmurs, ectopy, or gallups. . No lateral PMI. No thrills. Abdomen:  . Abdomen is soft, non-tender, non-distended . No hernias, masses, or organomegaly . Normoactive bowel sounds.  Musculoskeletal:  . No cyanosis, clubbing, or edema Skin:  . No rashes,  lesions, ulcers . palpation of skin: no induration or nodules Neurologic:  . CN 2-12 intact . Sensation all 4 extremities intact Psychiatric:  . Mental status o Mood, affect appropriate o Orientation to person, place, time  . judgment and insight appear intact  I have personally reviewed the following:   Today's Data  . Vitals, BMP, CBC  Imaging  . Abdominal ultrasound: hepatic cirrhosis without focal lesions or intra-abdominal ascites. Increased echogenicity within the renal parenchyma bilaterally consistent with medical renal disease. No hydronephrosis.  Scheduled Meds: . aspirin  81 mg Oral Daily  . carvedilol  25 mg Oral Daily  . folic acid  1 mg Oral Daily  . gabapentin  300 mg Oral BID  . heparin injection (subcutaneous)  5,000 Units Subcutaneous Q8H  . insulin aspart  0-15 Units Subcutaneous TID WC  . insulin aspart  0-5 Units Subcutaneous QHS  . multivitamin with minerals  1 tablet Oral Daily  . mycophenolate  500 mg Oral Q12H  . pantoprazole  40 mg Oral Daily  . tacrolimus  0.5 mg Oral Q12H  . thiamine  100 mg Oral Daily   Or  . thiamine  100 mg Intravenous Daily   Continuous Infusions:  Principal Problem:   Alcohol withdrawal (HCC) Active Problems:   HTN (hypertension)   AKI (acute kidney injury) ([place])   Liver cirrhosis (HCC)   Depression   LOS: 1 day   A & P  Acute alcohol withdrawal: Patient is tremulous, tachycardic, hypertensive, and  anxious.  Reports drinking several small bottles of rum daily.  He has been trying to cut down on his drinking for the past 1 week. The patient has been admitted to stepdown unit. He is on a CIWA protocol with Ativan as needed.  Thiamine, folate, and multivitamin to supplement.  AKI: Creatinine 2.0 on admission, baseline 0.9. Creatinine is 1.11 after some hydration. Monitor creatinine, electrolytes, and volume status. Renal ultrasound has been completed. It suggests medical renal disease and shows no evidence of  hydronephrosis.   Mild high anion gap metabolic acidosis: Resolved. Monitor.  Alcoholic liver cirrhosis/history of liver transplant: No signs of hepatic encephalopathy at this time.  Abdomen appears distended but nontender to palpation.  No fever or leukocytosis. Ultrasound demonstrated no ascites, but did show hepatic cirrhosis. Continue anti-rejection meds.   Hypertension: Blood pressure elevated in the setting of acute alcohol withdrawal. Patient was given a dose of labetalol in the ED. Home antihypertensives have been started.  QT prolongation EKG: Keep potassium above 4 and magnesium above 2.  Repeat EKG demonstrates continued prolonged Qtc. Monitor and avoid QT prolonging drugs if possible.  Depression: Patient reports feeling depressed due to recently being separated from his wife and other family problems.  Denies suicidal or homicidal ideation. He will need follow-up with psychiatry upon discharge.  Insulin-dependent diabetes: HemoglobinA1c 5.2.  Sliding scale insulin and CBG checks.  DVT prophylaxis: Subcutaneous heparin Code Status: Full code Family Communication: No family available at this time. Disposition Plan: Status is: Inpatient  Ladislao Cohenour, DO [facility] Direct contact: see [email or web address]  7PM-7AM contact night coverage as above 04/29/2020, 3:20 PM  LOS: 1 day

## 2020-04-29 NOTE — Progress Notes (Signed)
Bruise noted on patients lower mid back and shoulder. Psoriasis noted scattered on body. Need 2nd RN to chart in flowsheet unavailable at this time

## 2020-04-30 LAB — CBC WITH DIFFERENTIAL/PLATELET
Abs Immature Granulocytes: 0.01 10*3/uL (ref 0.00–0.07)
Basophils Absolute: 0 10*3/uL (ref 0.0–0.1)
Basophils Relative: 1 %
Eosinophils Absolute: 0.1 10*3/uL (ref 0.0–0.5)
Eosinophils Relative: 4 %
HCT: 33.4 % — ABNORMAL LOW (ref 39.0–52.0)
Hemoglobin: 11.1 g/dL — ABNORMAL LOW (ref 13.0–17.0)
Immature Granulocytes: 0 %
Lymphocytes Relative: 14 %
Lymphs Abs: 0.5 10*3/uL — ABNORMAL LOW (ref 0.7–4.0)
MCH: 35.1 pg — ABNORMAL HIGH (ref 26.0–34.0)
MCHC: 33.2 g/dL (ref 30.0–36.0)
MCV: 105.7 fL — ABNORMAL HIGH (ref 80.0–100.0)
Monocytes Absolute: 0.7 10*3/uL (ref 0.1–1.0)
Monocytes Relative: 22 %
Neutro Abs: 2 10*3/uL (ref 1.7–7.7)
Neutrophils Relative %: 59 %
Platelets: 99 10*3/uL — ABNORMAL LOW (ref 150–400)
RBC: 3.16 MIL/uL — ABNORMAL LOW (ref 4.22–5.81)
RDW: 12.7 % (ref 11.5–15.5)
WBC: 3.4 10*3/uL — ABNORMAL LOW (ref 4.0–10.5)
nRBC: 0 % (ref 0.0–0.2)

## 2020-04-30 LAB — BASIC METABOLIC PANEL
Anion gap: 8 (ref 5–15)
BUN: 14 mg/dL (ref 6–20)
CO2: 21 mmol/L — ABNORMAL LOW (ref 22–32)
Calcium: 8.5 mg/dL — ABNORMAL LOW (ref 8.9–10.3)
Chloride: 108 mmol/L (ref 98–111)
Creatinine, Ser: 1.33 mg/dL — ABNORMAL HIGH (ref 0.61–1.24)
GFR calc Af Amer: >60 mL/min (ref >60)
GFR calc non Af Amer: 59 mL/min — ABNORMAL LOW (ref >60)
Glucose, Bld: 122 mg/dL — ABNORMAL HIGH (ref 70–99)
Potassium: 4.1 mmol/L (ref 3.5–5.1)
Sodium: 137 mmol/L (ref 135–145)

## 2020-04-30 LAB — GLUCOSE, CAPILLARY
Glucose-Capillary: 145 mg/dL — ABNORMAL HIGH (ref 70–99)
Glucose-Capillary: 124 mg/dL — ABNORMAL HIGH (ref 70–99)
Glucose-Capillary: 108 mg/dL — ABNORMAL HIGH (ref 70–99)
Glucose-Capillary: 144 mg/dL — ABNORMAL HIGH (ref 70–99)

## 2020-04-30 NOTE — Progress Notes (Signed)
PROGRESS NOTE  Thomas [name] [id number] DOB: Sep 16, 1961 DOA: 04/28/2020 PCP: Alroy Dust, L.Marlou Sa, MD  Brief History   The patient is a 59 yr old man who presented to [facility] on 04/28/2020 stating that he was seeking alcohol rehab. The patient has a past medical history significant for liver transplant in 2018 with new cirrhosis of the liver with portal hypertension/gastropathy and varices. He also has hypertension, hyperlipidemia, and ethanol dependence. He states that he has begun drinking heavily again due to family stressors. He has felt depressed. He denies fevers, chills, cough, chest pain, nausea, vomiting, abdominal pain, or diarrhea.   He is tachycardic and a little hypertensive in the ED. He is being roomed in the ED pending a room upstairs.  Pt noted to have echymoses on his lower mid-back and shoulder by nursing on 04/29/2020. Possibly due to falls and/or thrombocytopenia.  Consultants  . None  Procedures  . None  Antibiotics   Anti-infectives (From admission, onward)   None     Subjective  The patient is lying quietly in bed. No new complaints.  Objective   Vitals:  Vitals:   04/30/20 1227 04/30/20 1607  BP: (!) 175/109 (!) 162/95  Pulse: 94 94  Resp:  19  Temp: 98 F (36.7 C) 97.6 F (36.4 C)  SpO2: 97% 97%   Exam:  Constitutional:  . The patient is awake, alert, and oriented x 3. No acute distress. Respiratory:  . No increased work of breathing. . No wheezes, rales, or rhonchi . No tactile fremitus Cardiovascular:  . Regular rate and rhythm . No murmurs, ectopy, or gallups. . No lateral PMI. No thrills. Abdomen:  . Abdomen is soft, non-tender, non-distended . No hernias, masses, or organomegaly . Normoactive bowel sounds.  Musculoskeletal:  . No cyanosis, clubbing, or edema Skin:  . No rashes, lesions, ulcers . palpation of skin: no induration or nodules Neurologic:  . CN 2-12 intact . Sensation all 4 extremities intact . He is  tremulous. Psychiatric:  . Mental status o Mood, affect appropriate o Orientation to person, place, time  . judgment and insight appear intact  I have personally reviewed the following:   Today's Data  . Vitals, BMP, CBC  Imaging  . Abdominal ultrasound: hepatic cirrhosis without focal lesions or intra-abdominal ascites. Increased echogenicity within the renal parenchyma bilaterally consistent with medical renal disease. No hydronephrosis.  Scheduled Meds: . aspirin  81 mg Oral Daily  . carvedilol  25 mg Oral Daily  . folic acid  1 mg Oral Daily  . gabapentin  300 mg Oral BID  . heparin injection (subcutaneous)  5,000 Units Subcutaneous Q8H  . insulin aspart  0-15 Units Subcutaneous TID WC  . insulin aspart  0-5 Units Subcutaneous QHS  . multivitamin with minerals  1 tablet Oral Daily  . mycophenolate  500 mg Oral Q12H  . pantoprazole  40 mg Oral Daily  . tacrolimus  0.5 mg Oral Q12H  . thiamine  100 mg Oral Daily   Or  . thiamine  100 mg Intravenous Daily   Continuous Infusions:  Principal Problem:   Alcohol withdrawal (HCC) Active Problems:   HTN (hypertension)   AKI (acute kidney injury) ([place])   Liver cirrhosis (HCC)   Depression   LOS: 2 days   A & P  Acute alcohol withdrawal: Patient is tremulous, tachycardic, hypertensive, and anxious.  Reports drinking several small bottles of rum daily.  He has been trying to cut down on his drinking for the past  1 week. The patient has been admitted to stepdown unit. He is on a CIWA protocol with Ativan as needed.  Thiamine, folate, and multivitamin to supplement.  AKI: Creatinine 2.0 on admission, baseline 0.9. Creatinine is 1.33 today.  Monitor creatinine, electrolytes, and volume status. Renal ultrasound has been completed. It suggests medical renal disease and shows no evidence of hydronephrosis.   Mild high anion gap metabolic acidosis: Resolved. Monitor.  Alcoholic liver cirrhosis/history of liver transplant: No  signs of hepatic encephalopathy at this time.  Abdomen appears distended but nontender to palpation.  No fever or leukocytosis. Ultrasound demonstrated no ascites, but did show hepatic cirrhosis. Continue anti-rejection meds.   Thrombocytopenia: Likely related to liver disease.  Ecchymoses: On mid lower back and shoulder. Likely related to thrombocytopenia and possibly falls due to recent heavy alcohol use.  Hypertension: Blood pressure elevated in the setting of acute alcohol withdrawal. Patient was given a dose of labetalol in the ED. Home antihypertensives have been started.  QT prolongation EKG: Keep potassium above 4 and magnesium above 2.  Repeat EKG demonstrates continued prolonged Qtc. Monitor and avoid QT prolonging drugs if possible.  Depression: Patient reports feeling depressed due to recently being separated from his wife and other family problems.  Denies suicidal or homicidal ideation. He will need follow-up with psychiatry upon discharge.  Insulin-dependent diabetes: HemoglobinA1c 5.2.  Sliding scale insulin and CBG checks.  I have seen and examined this patient myself. I have spent 34 minutes in his evaluation and care.  DVT prophylaxis: Subcutaneous heparin Code Status: Full code Family Communication: No family available at this time. Disposition Plan: Status is: Inpatient  Ouita Nish, DO [facility] Direct contact: see [email or web address]  7PM-7AM contact night coverage as above 04/30/2020, 5:57 PM  LOS: 1 day

## 2020-05-01 LAB — GLUCOSE, CAPILLARY
Glucose-Capillary: 124 mg/dL — ABNORMAL HIGH (ref 70–99)
Glucose-Capillary: 116 mg/dL — ABNORMAL HIGH (ref 70–99)

## 2020-05-01 MED ORDER — ADULT MULTIVITAMIN W/MINERALS CH: 1.0000 | ORAL_TABLET | Freq: Every day | ORAL | 0 refills | Status: DC

## 2020-05-01 MED ORDER — THIAMINE HCL 100 MG PO TABS: 100.0000 mg | ORAL_TABLET | Freq: Every day | ORAL | 0 refills | Status: DC

## 2020-05-01 MED ORDER — HYDRALAZINE HCL 25 MG PO TABS
25.0000 mg | ORAL_TABLET | Freq: Four times a day (QID) | ORAL | Status: DC
  Administered 2020-05-01 (×2): 25 mg via ORAL
  Filled 2020-05-01 (×2): qty 1

## 2020-05-01 MED ORDER — HYDRALAZINE HCL 25 MG PO TABS: 25.0000 mg | ORAL_TABLET | Freq: Four times a day (QID) | ORAL | 0 refills | Status: DC

## 2020-05-01 MED ORDER — MYCOPHENOLATE MOFETIL 250 MG PO CAPS: 500.0000 mg | ORAL_CAPSULE | Freq: Two times a day (BID) | ORAL | 11 refills | Status: AC

## 2020-05-01 MED ORDER — FOLIC ACID 1 MG PO TABS: 1.0000 mg | ORAL_TABLET | Freq: Every day | ORAL | 0 refills | Status: DC

## 2020-05-01 NOTE — Progress Notes (Signed)
BP 194/112, MD paged. New orders for hydralazine PO

## 2020-05-01 NOTE — Discharge Summary (Signed)
Physician Discharge Summary  Thomas [name] [id number] DOB: [birth year] DOA: 04/28/2020  PCP: Alroy Dust, L.Marlou Sa, MD  Admit date: 04/28/2020 Discharge date: 05/01/2020  Recommendations for Outpatient Follow-up:  1. Discharge to home. 2. Follow up with PCP in 7-10 days. 3. Seek help with cessation of alcohol from AA or other outpatient rehab.  Discharge Diagnoses: Principal diagnosis is #1 1. ETOH withdrawal 2. Uncontrolled hypertension 3. AKI 4. HAGMA 5. Hepatic Cirrhosis 6. S/P Hepatic transplant 7. QT prolongation  Discharge Condition: Fair  Disposition: Home  Diet recommendation: Heart healthy with carbohydrate modification  Filed Weights   04/29/20 1552 04/30/20 0317 05/01/20 0421  Weight: 79.9 kg 80.2 kg 79.4 kg   History of present illness:  Thomas [name] is a 59 y.o. male with medical history significant of alcoholic cirrhosis with resultant portal hypertension/gastropathy and varices status post liver transplantation in 2018, hypertension, hyperlipidemia, ethanol dependence presenting to the ED requesting alcohol detox.  Patient states he has been drinking heavily since his separation from his wife and due to other family problems.  He drinks several small "airplane" bottles of rum daily.  States for the past 1 week he was trying to cut down on his alcohol use on his own.  He has been very unsteady when walking and fell this morning.  He is not sure if he injured his head.  Not reporting any other injuries from the fall.  States he has been feeling a little depressed due to all the things that are going on in his life but denies any suicidal or homicidal ideation.  States he came into the hospital today to request help with detoxification.  He is interested in going to a rehab facility.  He has no other complaints.  Denies fevers, chills, cough, chest pain, nausea, vomiting, abdominal pain, or diarrhea.  Reports having chronic intermittent dyspnea on exertion.  ED Course:  Afebrile.  Tremulous, tachycardic, hypertensive, and anxious.  Labs showing no leukocytosis.  Blood ethanol level 313 on initial labs, few hours later down to 92.  Bicarb 18, anion gap 16.  Blood glucose 135.  Creatinine 2.0, baseline 0.9.  LFTs normal.  UDS pending.  Screening SARS-CoV-2 PCR test pending.  Hospital Course:  The patient is a 59 yr old man who presented to [facility] on 04/28/2020 stating that he was seeking alcohol rehab. The patient has a past medical history significant for liver transplant in 2018 with new cirrhosis of the liver with portal hypertension/gastropathy and varices. He also has hypertension, hyperlipidemia, and ethanol dependence. He states that he has begun drinking heavily again due to family stressors. He has felt depressed. He denies fevers, chills, cough, chest pain, nausea, vomiting, abdominal pain, or diarrhea.   He is tachycardic and a little hypertensive in the ED. He is being roomed in the ED pending a room upstairs.  Pt noted to have echymoses on his lower mid-back and shoulder by nursing on 04/29/2020. Possibly due to falls and/or thrombocytopenia.  The patient is not tremulous today. He has ambulated in the halls without difficulty. He is being discharged to home in fair condition.  Today's assessment: S: The patient is resting comfortably. No complaints. O: Vitals:  Vitals:   05/01/20 1112 05/01/20 1445  BP: (!) 147/94 (!) 165/102  Pulse: 90   Resp: 18   Temp: 99 F (37.2 C)   SpO2: 95%    Exam:  Constitutional:   The patient is awake, alert, and oriented x 3. No acute distress. Respiratory:   No  increased work of breathing.  No wheezes, rales, or rhonchi  No tactile fremitus Cardiovascular:   Regular rate and rhythm  No murmurs, ectopy, or gallups.  No lateral PMI. No thrills. Abdomen:   Abdomen is soft, non-tender, non-distended  No hernias, masses, or organomegaly  Normoactive bowel sounds.  Musculoskeletal:   No  cyanosis, clubbing, or edema Skin:   No rashes, lesions, ulcers  palpation of skin: no induration or nodules Neurologic:   CN 2-12 intact  Sensation all 4 extremities intact  He is tremulous. Psychiatric:   Mental status ? Mood, affect appropriate ? Orientation to person, place, time   judgment and insight appear intact  Discharge Instructions  Discharge Instructions    Activity as tolerated - No restrictions   Complete by: As directed    Call MD for:  persistant nausea and vomiting   Complete by: As directed    Call MD for:  severe uncontrolled pain   Complete by: As directed    Diet - low sodium heart healthy   Complete by: As directed    Discharge instructions   Complete by: As directed    Discharge to home. Follow up with PCP in 7-10 days. Seek help with cessation of alcohol from AA or other outpatient rehab.   Increase activity slowly   Complete by: As directed      Allergies as of 05/01/2020      Reactions   Xifaxan [rifaximin] Swelling, Other (See Comments)   Reaction:  All over body swelling       Medication List    STOP taking these medications   doxycycline 100 MG tablet Commonly known as: VIBRA-TABS     TAKE these medications   aspirin 81 MG chewable tablet Chew 81 mg by mouth daily.   betamethasone (augmented) 0.05 % gel Commonly known as: DIPROLENE Apply 1 application topically 2 (two) times daily.   bismuth subsalicylate 99991111 MG chewable tablet Commonly known as: PEPTO BISMOL Chew 262-524 mg by mouth 3 (three) times daily as needed for indigestion.   carvedilol 25 MG tablet Commonly known as: COREG Take 1 tablet (25 mg total) by mouth 2 (two) times a day. What changed: when to take this   folic acid 1 MG tablet Commonly known as: FOLVITE Take 1 tablet (1 mg total) by mouth daily. Start taking on: May 02, 2020   gabapentin 300 MG capsule Commonly known as: NEURONTIN Take 300 mg by mouth 2 (two) times daily.   hydrALAZINE 25 MG  tablet Commonly known as: APRESOLINE Take 1 tablet (25 mg total) by mouth every 6 (six) hours.   insulin regular 100 units/mL injection Commonly known as: NOVOLIN R Inject 3-4 Units into the skin See admin instructions. As needed.   multivitamin with minerals Tabs tablet Take 1 tablet by mouth daily. Start taking on: May 02, 2020   mycophenolate 250 MG capsule Commonly known as: CELLCEPT Take 2 capsules (500 mg total) by mouth every 12 (twelve) hours.   pantoprazole 40 MG tablet Commonly known as: PROTONIX Take 40 mg by mouth daily.   tacrolimus 0.5 MG capsule Commonly known as: PROGRAF Take 0.5 mg by mouth every 12 (twelve) hours.   thiamine 100 MG tablet Take 1 tablet (100 mg total) by mouth daily. Start taking on: May 02, 2020   Tylenol 325 MG Caps Generic drug: Acetaminophen Take 650 mg by mouth daily.      Allergies  Allergen Reactions  . Xifaxan [Rifaximin] Swelling and Other (See  Comments)    Reaction:  All over body swelling     The results of significant diagnostics from this hospitalization (including imaging, microbiology, ancillary and laboratory) are listed below for reference.    Significant Diagnostic Studies: CT HEAD WO CONTRAST  Result Date: 04/28/2020 CLINICAL DATA:  Fall today. Alcoholic patient uncertain of events. Ataxia and difficulty walking. EXAM: CT HEAD WITHOUT CONTRAST TECHNIQUE: Contiguous axial images were obtained from the base of the skull through the vertex without intravenous contrast. COMPARISON:  Head CT 07/08/2019 FINDINGS: Brain: Mild generalized atrophy, stable from prior. No intracranial hemorrhage, mass effect, or midline shift. No hydrocephalus. The basilar cisterns are patent. No evidence of territorial infarct or acute ischemia. No extra-axial or intracranial fluid collection. Vascular: Atherosclerosis of skullbase vasculature without hyperdense vessel or abnormal calcification. Skull: No fracture or focal lesion. Sinuses/Orbits:  Unchanged opacification of posterior right ethmoid air cells from prior exam. Paranasal sinuses otherwise clear. The mastoid air cells are well aerated. No acute orbital abnormality. Other: Mild left frontal scalp soft tissue thickening which is unchanged from prior exam and likely scarring. IMPRESSION: No acute intracranial abnormality. No skull fracture. Electronically Signed   By: Keith Rake M.D.   On: 04/28/2020 20:31   CT Cervical Spine Wo Contrast  Result Date: 04/28/2020 CLINICAL DATA:  Head trauma, headache Neck trauma, midline tenderness (Age 10[birth year]) alcoholic headache after fall Fall today. Alcoholic patient uncertain of events. Ataxia and difficulty walking. EXAM: CT CERVICAL SPINE WITHOUT CONTRAST TECHNIQUE: Multidetector CT imaging of the cervical spine was performed without intravenous contrast. Multiplanar CT image reconstructions were also generated. COMPARISON:  None. FINDINGS: Alignment: Normal. Skull base and vertebrae: No acute fracture. Vertebral body heights are maintained. The dens and skull base are intact. Underlying osteopenia/osteoporosis. Soft tissues and spinal canal: No prevertebral fluid or swelling. No visible canal hematoma. Disc levels:  Disc space narrowing at C5-C6 and C7-T1. Upper chest: No acute findings. Other: Carotid calcifications. IMPRESSION: Mild degenerative change in the cervical spine without acute fracture or subluxation. Electronically Signed   By: Keith Rake M.D.   On: 04/28/2020 20:35   US Abdomen Complete  Result Date: 04/29/2020 CLINICAL DATA:  Initial evaluation for acute renal insufficiency, ascites, liver transplant. EXAM: ABDOMEN ULTRASOUND COMPLETE COMPARISON:  Prior ultrasound from 04/30/2017. FINDINGS: Gallbladder: Not visualized. Common bile duct: Diameter: Not visualized. No appreciable intra or extrahepatic biliary dilatation. Liver: Liver demonstrates a coarse echogenic echotexture with nodular contour, suggesting cirrhosis. No focal  intrahepatic lesion. Portal vein is patent on color Doppler imaging with normal direction of blood flow towards the liver. IVC: No abnormality visualized. Pancreas: Not well seen due to body habitus and overlying shadowing. Spleen: Size and appearance within normal limits. Right Kidney: Length: 10.5 cm. Mildly increased echogenicity within the renal parenchyma, suggesting medical renal disease. No nephrolithiasis or hydronephrosis. No focal renal mass. Left Kidney: Length: 10.1 cm. Mildly increased echogenicity within the renal parenchyma, suggesting medical renal disease. No nephrolithiasis or hydronephrosis. No focal renal mass. Abdominal aorta: Not visualized. Other findings: No visible ascites within the abdomen and pelvis. IMPRESSION: 1. Findings suggestive of hepatic cirrhosis. No focal lesions or intra-abdominal ascites. 2. Increased echogenicity within the renal parenchyma bilaterally, consistent with medical renal disease. No hydronephrosis or other acute finding. 3. No other acute abnormality within the abdomen. Electronically Signed   By: Jeannine Boga M.D.   On: 04/29/2020 01:44    Microbiology: Recent Results (from the past 240 hour(s))  SARS Coronavirus 2 by RT PCR (hospital order,  performed in [facility] hospital lab) Nasopharyngeal Nasopharyngeal Swab     Status: None   Collection Time: 04/28/20 10:42 PM   Specimen: Nasopharyngeal Swab  Result Value Ref Range Status   SARS Coronavirus 2 NEGATIVE NEGATIVE Final    Comment: (NOTE) SARS-CoV-2 target nucleic acids are NOT DETECTED. The SARS-CoV-2 RNA is generally detectable in upper and lower respiratory specimens during the acute phase of infection. The lowest concentration of SARS-CoV-2 viral copies this assay can detect is 250 copies / mL. A negative result does not preclude SARS-CoV-2 infection and should not be used as the sole basis for treatment or other patient management decisions.  A negative result may occur  with improper specimen collection / handling, submission of specimen other than nasopharyngeal swab, presence of viral mutation(s) within the areas targeted by this assay, and inadequate number of viral copies (<250 copies / mL). A negative result must be combined with clinical observations, patient history, and epidemiological information. Fact Sheet for Patients:   StrictlyIdeas.no Fact Sheet for Healthcare Providers: BankingDealers.co.za This test is not yet approved or cleared  by the [place] FDA and has been authorized for detection and/or diagnosis of SARS-CoV-2 by FDA under an Emergency Use Authorization (EUA).  This EUA will remain in effect (meaning this test can be used) for the duration of the COVID-19 declaration under Section 564(b)(1) of the Act, 21 U.S.C. section 360bbb-3(b)(1), unless the authorization is terminated or revoked sooner. Performed at [facility], [place] [address], [place], [place] [address]      Labs: Basic Metabolic Panel: Recent Labs  Lab 04/28/20 1215 04/29/20 0449 04/30/20 0338  NA 139 139 137  K 4.8 4.4 4.1  CL 105 108 108  CO2 18* 22 21*  GLUCOSE 135* 124* 122*  BUN 20 17 14   CREATININE 2.01* 1.37* 1.33*  CALCIUM 8.7* 8.0* 8.5*  MG  --  1.4*  --   PHOS  --  2.1*  --    Liver Function Tests: Recent Labs  Lab 04/28/20 1215  AST 34  ALT 18  ALKPHOS 53  BILITOT 0.6  PROT 6.7  ALBUMIN 3.7   No results for input(s): LIPASE, AMYLASE in the last 168 hours. No results for input(s): AMMONIA in the last 168 hours. CBC: Recent Labs  Lab 04/28/20 1215 04/30/20 0338  WBC 4.5 3.4*  NEUTROABS 3.0 2.0  HGB 13.5 11.1*  HCT 40.4 33.4*  MCV 105.5* 105.7*  PLT 122* 99*   Cardiac Enzymes: No results for input(s): CKTOTAL, CKMB, CKMBINDEX, TROPONINI in the last 168 hours. BNP: BNP (last 3 results) No results for input(s): BNP in the last 8760 hours.  ProBNP (last 3 results) No  results for input(s): PROBNP in the last 8760 hours.  CBG: Recent Labs  Lab 04/30/20 1226 04/30/20 1605 04/30/20 2106 05/01/20 0853 05/01/20 1111  GLUCAP 124* 108* 144* 124* 116*    Principal Problem:   Alcohol withdrawal (HCC) Active Problems:   HTN (hypertension)   AKI (acute kidney injury) ([place])   Liver cirrhosis (HCC)   Depression   Time coordinating discharge: 38 minutes.  Signed:        Avey Mcmanamon, DO [facility]  05/01/2020, 5:35 PM

## 2020-05-27 ENCOUNTER — Encounter: Payer: Self-pay | Admitting: Neurology

## 2020-05-27 ENCOUNTER — Ambulatory Visit: Payer: Medicare Other | Admitting: Neurology

## 2020-05-27 ENCOUNTER — Telehealth: Payer: Self-pay | Admitting: *Deleted

## 2020-05-27 NOTE — Telephone Encounter (Signed)
No showed new patient appointment. 

## 2021-07-01 ENCOUNTER — Ambulatory Visit: Payer: Medicare Other | Admitting: Diagnostic Neuroimaging

## 2021-07-22 ENCOUNTER — Other Ambulatory Visit: Payer: Self-pay

## 2021-07-22 DIAGNOSIS — R55 Syncope and collapse: Secondary | ICD-10-CM

## 2021-08-02 DIAGNOSIS — R9431 Abnormal electrocardiogram [ECG] [EKG]: Secondary | ICD-10-CM | POA: Diagnosis not present

## 2021-08-03 DIAGNOSIS — I10 Essential (primary) hypertension: Secondary | ICD-10-CM

## 2021-08-03 DIAGNOSIS — F1010 Alcohol abuse, uncomplicated: Secondary | ICD-10-CM

## 2021-08-03 DIAGNOSIS — I959 Hypotension, unspecified: Secondary | ICD-10-CM

## 2021-08-03 DIAGNOSIS — R9431 Abnormal electrocardiogram [ECG] [EKG]: Secondary | ICD-10-CM

## 2021-09-08 ENCOUNTER — Other Ambulatory Visit: Payer: Self-pay | Admitting: *Deleted

## 2021-09-08 ENCOUNTER — Encounter: Payer: Self-pay | Admitting: *Deleted

## 2021-09-13 ENCOUNTER — Telehealth: Payer: Self-pay | Admitting: Diagnostic Neuroimaging

## 2021-09-13 ENCOUNTER — Ambulatory Visit (INDEPENDENT_AMBULATORY_CARE_PROVIDER_SITE_OTHER): Payer: Medicare Other | Admitting: Diagnostic Neuroimaging

## 2021-09-13 ENCOUNTER — Encounter: Payer: Self-pay | Admitting: Diagnostic Neuroimaging

## 2021-09-13 ENCOUNTER — Other Ambulatory Visit: Payer: Self-pay

## 2021-09-13 VITALS — BP 145/106 | HR 105 | Ht 65.0 in | Wt 158.0 lb

## 2021-09-13 DIAGNOSIS — G621 Alcoholic polyneuropathy: Secondary | ICD-10-CM

## 2021-09-13 DIAGNOSIS — F1020 Alcohol dependence, uncomplicated: Secondary | ICD-10-CM | POA: Diagnosis not present

## 2021-09-13 DIAGNOSIS — G312 Degeneration of nervous system due to alcohol: Secondary | ICD-10-CM | POA: Diagnosis not present

## 2021-09-13 DIAGNOSIS — R269 Unspecified abnormalities of gait and mobility: Secondary | ICD-10-CM

## 2021-09-13 NOTE — Patient Instructions (Addendum)
  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

## 2021-09-13 NOTE — Progress Notes (Signed)
[facility]  PATIENT: Thomas [name] DOB: [birth year]  REFERRING CLINICIAN: Philmore Pali, NP HISTORY FROM: patient  REASON FOR VISIT: new consult    HISTORICAL  CHIEF COMPLAINT:  Chief Complaint  Patient presents with   Dizziness    Rm 6 with mom joan  Pt is well, has been having some balance complications  for about a yr. Had a fall due to a dog last yr and spent 5 days in hospital, balance/dizziness started then.     HISTORY OF PRESENT ILLNESS:   60 year old male with history of alcohol abuse, status post liver transplant, here for evaluation of gait and balance difficulty.  Patient reports at least 1 year of progressive gait and balance difficulty, falls and progressive decline.  He feels weakness in his legs.  He has neuropathy in the feet.  He has been using a cane.  He remembers falling down in May 2021 and going to the hospital.  Patient living alone in a motel in [place].  He was previously married but now divorced.  He is looking to move to an independent living retirement community if possible.  His mother lives in an independent living community in [place].   REVIEW OF SYSTEMS: Full 14 system review of systems performed and negative with exception of: as per HPI.  ALLERGIES: Allergies  Allergen Reactions   Xifaxan [Rifaximin] Swelling and Other (See Comments)    Reaction:  All over body swelling     HOME MEDICATIONS: Outpatient Medications Prior to Visit  Medication Sig Dispense Refill   Acetaminophen (TYLENOL) 325 MG CAPS Take 650 mg by mouth daily.     aspirin 81 MG chewable tablet Chew 81 mg by mouth daily.     betamethasone, augmented, (DIPROLENE) 0.05 % gel Apply 1 application topically 2 (two) times daily.      bismuth subsalicylate (PEPTO BISMOL) 262 MG chewable tablet Chew 262-524 mg by mouth 3 (three) times daily as needed for indigestion.     carvedilol (COREG) 25 MG tablet Take 1 tablet (25  mg total) by mouth 2 (two) times a day. (Patient taking differently: Take 25 mg by mouth daily.) 60 tablet 0   folic acid (FOLVITE) 1 MG tablet Take 1 tablet (1 mg total) by mouth daily. 30 tablet 0   gabapentin (NEURONTIN) 300 MG capsule Take 300 mg by mouth 2 (two) times daily.     insulin regular (NOVOLIN R) 100 units/mL injection Inject 3-4 Units into the skin See admin instructions. As needed.     losartan (COZAAR) 50 MG tablet Take 50 mg by mouth daily.     Multiple Vitamin (MULTIVITAMIN WITH MINERALS) TABS tablet Take 1 tablet by mouth daily. 30 tablet 0   tacrolimus (PROGRAF) 0.5 MG capsule Take 0.5 mg by mouth every 12 (twelve) hours.     thiamine 100 MG tablet Take 1 tablet (100 mg total) by mouth daily. 30 tablet 0   hydrALAZINE (APRESOLINE) 25 MG tablet Take 1 tablet (25 mg total) by mouth every 6 (six) hours. 120 tablet 0   pantoprazole (PROTONIX) 40 MG tablet Take 40 mg by mouth daily.     pantoprazole (PROTONIX) 40 MG tablet Take 40 mg by mouth as needed.     No facility-administered medications prior to visit.    PAST MEDICAL HISTORY: Past Medical History:  Diagnosis Date   Alcohol abuse 8841   Alcoholic cirrhosis ([place]) 66/0630   Anemia 02/2016   in setting of  GI blood loss, but MCV macrocytic.    Coagulopathy ([place]) 05/2015   Diverticulosis 09/2015   Dizziness    Esophageal varices ([place]) 09/2015   small   GERD (gastroesophageal reflux disease)    Hyperlipidemia    Hypertension    Pneumonia    Portal hypertension ([place]) 09/2015   with portal gastropathy   Psoriasis    Thrombocytopenia ([place]) 02/2016   Tubular adenoma of colon 09/2015    PAST SURGICAL HISTORY: Past Surgical History:  Procedure Laterality Date   ESOPHAGOGASTRODUODENOSCOPY N/A 03/05/2016   Procedure: ESOPHAGOGASTRODUODENOSCOPY (EGD);  Surgeon: Ladene Artist, MD;  Location: [name];  Service: Endoscopy;  Laterality: N/A;   ESOPHAGOGASTRODUODENOSCOPY N/A 04/29/2017   Procedure:  ESOPHAGOGASTRODUODENOSCOPY (EGD);  Surgeon: Milus Banister, MD;  Location: [name];  Service: Endoscopy;  Laterality: N/A;   ESOPHAGOGASTRODUODENOSCOPY (EGD) WITH PROPOFOL N/A 03/29/2017   Procedure: ESOPHAGOGASTRODUODENOSCOPY (EGD) WITH PROPOFOL;  Surgeon: Doran Stabler, MD;  Location: [facility];  Service: Endoscopy;  Laterality: N/A;   IR PARACENTESIS  05/15/2017   IR PARACENTESIS  06/11/2017   IR PARACENTESIS  06/26/2017   IR PARACENTESIS  07/10/2017   IR PARACENTESIS  08/16/2017   IR PARACENTESIS  09/14/2017   IR THORACENTESIS ASP PLEURAL SPACE W/IMG GUIDE  06/28/2017   LIVER TRANSPLANT     [facility]   testicle prosthesis      FAMILY HISTORY: Family History  Problem Relation Age of Onset   Glaucoma Mother    Hypertension Father    Diabetes Father    Parkinson's disease Father    Diabetes Paternal Aunt     SOCIAL HISTORY: Social History   Socioeconomic History   Marital status: Legally Separated    Spouse name: Lelan Pons   Number of children: 3   Years of education: Not on file   Highest education level: Not on file  Occupational History   Occupation: disability  Tobacco Use   Smoking status: Former    Types: Cigarettes   Smokeless tobacco: Never   Tobacco comments:    Smoked 15 yrs, quit 1991  Vaping Use   Vaping Use: Never used  Substance and Sexual Activity   Alcohol use: Not Currently    Alcohol/week: 0.0 standard drinks    Comment: 04/2020  2 DAYS AGO i FELL OFF THE WAGON "   Drug use: No   Sexual activity: Not Currently  Other Topics Concern   Not on file  Social History Narrative   Lives with wife.  Normally able to ambulate without assistance.   Social Determinants of Health   Financial Resource Strain: Not on file  Food Insecurity: Not on file  Transportation Needs: Not on file  Physical Activity: Not on file  Stress: Not on file  Social Connections: Not on file  Intimate Partner Violence: Not on file     PHYSICAL EXAM  GENERAL  EXAM/CONSTITUTIONAL: Vitals:  Vitals:   09/13/21 0813  BP: (!) 145/106  Pulse: (!) 105  Weight: 158 lb (71.7 kg)  Height: 5\' 5"  (1.651 m)   Body mass index is 26.29 kg/m. Wt Readings from Last 3 Encounters:  09/13/21 158 lb (71.7 kg)  05/01/20 175 lb (79.4 kg)  07/08/19 160 lb 11.5 oz (72.9 kg)   Patient is in no distress; well developed, nourished and groomed; neck is supple  CARDIOVASCULAR: Examination of carotid arteries is normal; no carotid bruits Regular rate and rhythm, no murmurs Examination of peripheral vascular system by observation and palpation is normal  EYES: Ophthalmoscopic exam of optic discs and posterior segments is normal; no papilledema or hemorrhages No results found.  MUSCULOSKELETAL: Gait, strength, tone, movements noted in Neurologic exam below  NEUROLOGIC: MENTAL STATUS:  No flowsheet data found. awake, alert, oriented to person, place and time recent and remote memory intact normal attention and concentration language fluent, comprehension intact, naming intact fund of knowledge appropriate  CRANIAL NERVE:  2nd - no papilledema on fundoscopic exam 2nd, 3rd, 4th, 6th - pupils equal and reactive to light, visual fields full to confrontation, extraocular muscles intact, no nystagmus 5th - facial sensation symmetric 7th - facial strength symmetric 8th - hearing intact 9th - palate elevates symmetrically, uvula midline 11th - shoulder shrug symmetric 12th - tongue protrusion midline  MOTOR:  normal bulk and tone, full strength in the BUE, BLE; EXCEPT LIMITED IN BLE DUE TO PAIN AND WEAKNESS  SENSORY:  normal and symmetric to light touch, temperature, vibration;   COORDINATION:  finger-nose-finger, fine finger movements normal  REFLEXES:  deep tendon reflexes BRISK IN ARMS and symmetric; TRACE AT ANKLES  GAIT/STATION:  WIDE GAIT; UNSTEADY; ATAXIC GAIT     DIAGNOSTIC DATA (LABS, IMAGING, TESTING) - I reviewed patient records,  labs, notes, testing and imaging myself where available.  Lab Results  Component Value Date   WBC 3.4 (L) 04/30/2020   HGB 11.1 (L) 04/30/2020   HCT 33.4 (L) 04/30/2020   MCV 105.7 (H) 04/30/2020   PLT 99 (L) 04/30/2020      Component Value Date/Time   NA 137 04/30/2020 0338   K 4.1 04/30/2020 0338   CL 108 04/30/2020 0338   CO2 21 (L) 04/30/2020 0338   GLUCOSE 122 (H) 04/30/2020 0338   BUN 14 04/30/2020 0338   CREATININE 1.33 (H) 04/30/2020 0338   CALCIUM 8.5 (L) 04/30/2020 0338   PROT 6.7 04/28/2020 1215   ALBUMIN 3.7 04/28/2020 1215   AST 34 04/28/2020 1215   ALT 18 04/28/2020 1215   ALKPHOS 53 04/28/2020 1215   BILITOT 0.6 04/28/2020 1215   GFRNONAA 59 (L) 04/30/2020 0338   GFRAA >60 04/30/2020 0338   No results found for: CHOL, HDL, LDLCALC, LDLDIRECT, TRIG, CHOLHDL Lab Results  Component Value Date   HGBA1C 5.2 07/08/2019   Lab Results  Component Value Date   TOIZTIWP80 998 (H) 03/29/2017   Lab Results  Component Value Date   TSH 4.116 11/26/2016     04/28/20 CT head [I reviewed images myself and agree with interpretation. Mild atrophy noted. -VRP]  - No acute intracranial abnormality. No skull fracture.  04/28/20 CT cervical spine [I reviewed images myself and agree with interpretation. -VRP]  - Mild degenerative change in the cervical spine without acute fracture or subluxation.    ASSESSMENT AND PLAN  60 y.o. 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   Dx:  1. Gait difficulty   2. 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[facility])      PLAN:  GAIT DIFFICULTY - likely due to alcoholic neuropathy + alcoholic cerebellar degeneration - check MRI brain / cervical spine (eval gait diff and hyper-reflexia) - refer to home health evaluation (nursing, PT, social work) - discussed strategies to improve nutrition (protein content, 2 meals  per day), exercise; challenging due to living situation (lives alone at [facility]), recent divorce, prior alcohol abuse  INTERMITTENT HYPOGLYCEMIA - likely due to suboptimal nutrition and liver dz  ALCOHOL ABUSE (prior) -  encouraged patient to establish with psychology for long term abstinence  [place] - living at [facility]; limited nutrition options; living alone - referred to home health PT, nursing, social work   Orders Placed This Encounter  Procedures   [place]   Ambulatory referral to [place]   Return for pending if symptoms worsen or fail to improve, pending test results.  I spent 80 minutes of face-to-face and non-face-to-face time with patient.  This included previsit chart review, lab review, study review, order entry, electronic health record documentation, patient education.     Penni Bombard, MD 52/03/8184, 9:09 AM Certified in Neurology, Neurophysiology and Neuroimaging  [facility] [address], [place], [place] [address] [phone]

## 2021-09-13 NOTE — Telephone Encounter (Signed)
Medicare order sent to [facility], [facility] they will reach out to the patient to schedule.  °

## 2021-09-14 ENCOUNTER — Telehealth: Payer: Self-pay | Admitting: Diagnostic Neuroimaging

## 2021-09-14 NOTE — Telephone Encounter (Signed)
Home health referral accepted by [facility]. They will call patient to start care.

## 2021-09-15 ENCOUNTER — Telehealth: Payer: Self-pay | Admitting: Diagnostic Neuroimaging

## 2021-09-15 NOTE — Telephone Encounter (Signed)
Are you agreeable with this plan and for me to do VO ?

## 2021-09-15 NOTE — Telephone Encounter (Signed)
Donita, RN w/ [facility] has called for orders for skilled nursing for 1 time a week for 9 weeks.  Also she stated pt is no longer taking aspirin 81 MG chewable tablet,folic acid (FOLVITE) 1 MG ,no insulin, no multi vitamin and no thiamine 100 MG tablet.  Her vm is secure if a message needs to be left

## 2021-09-15 NOTE — Telephone Encounter (Signed)
Contacted donita back, gave her VO for skilled nursing for 1 time a week for 9 weeks. Also updated pt meds as informed by donita that he is no longer taking aspirin 81 MG chewable tablet,folic acid (FOLVITE) 1 MG ,no insulin, no multi vitamin and no thiamine 100 MG tablet. She was grateful  for the call back.

## 2021-09-20 ENCOUNTER — Telehealth: Payer: Self-pay | Admitting: Diagnostic Neuroimaging

## 2021-09-20 NOTE — Telephone Encounter (Signed)
I called Thomas [name] back and provided VO for physical therapy. She verbalized understanding and appreciation for the call.

## 2021-09-20 NOTE — Telephone Encounter (Signed)
[place] [facility] called with VO of PT. The order are as followed 2x 1w and 1x 7w. Call back number for [place] [phone].

## 2021-09-21 ENCOUNTER — Ambulatory Visit: Payer: Medicare Other | Admitting: Diagnostic Neuroimaging

## 2021-09-26 ENCOUNTER — Other Ambulatory Visit: Payer: Medicare Other

## 2021-09-26 ENCOUNTER — Inpatient Hospital Stay: Admission: RE | Admit: 2021-09-26 | Payer: Medicare Other | Source: Ambulatory Visit

## 2021-10-17 ENCOUNTER — Ambulatory Visit
Admission: RE | Admit: 2021-10-17 | Discharge: 2021-10-17 | Disposition: A | Discharge status: Home / Self Care | Payer: Medicare Other | Source: Ambulatory Visit | Attending: Diagnostic Neuroimaging | Admitting: Diagnostic Neuroimaging

## 2021-10-17 ENCOUNTER — Other Ambulatory Visit: Payer: Self-pay

## 2021-10-17 DIAGNOSIS — R269 Unspecified abnormalities of gait and mobility: Secondary | ICD-10-CM

## 2021-10-17 MED ORDER — GADOBENATE DIMEGLUMINE 529 MG/ML IV SOLN
14.0000 mL | Freq: Once | INTRAVENOUS | Status: AC | PRN
  Administered 2021-10-17: 14 mL via INTRAVENOUS

## 2021-10-20 ENCOUNTER — Telehealth: Payer: Self-pay

## 2021-10-20 NOTE — Telephone Encounter (Signed)
-----   Message from Penni Bombard, MD sent at 10/20/2021  2:43 PM EST ----- Unremarkable imaging results. Mild disc bulging; no issues related to balance. Please call patient. Continue current plan. -VRP

## 2021-10-20 NOTE — Telephone Encounter (Signed)
Contacted pt regarding results, informed him that MRI of brain showed Unremarkable imaging results as well as MRI of cervical spine showing Unremarkable imaging results. Mild disc bulging; no issues related to balance. Advised to Continue current plan and call the office back with questions as he had none at the time and was appreciate

## 2022-01-30 DIAGNOSIS — I10 Essential (primary) hypertension: Secondary | ICD-10-CM | POA: Diagnosis not present

## 2022-01-30 DIAGNOSIS — R69 Illness, unspecified: Secondary | ICD-10-CM | POA: Diagnosis not present

## 2022-01-30 DIAGNOSIS — R410 Disorientation, unspecified: Secondary | ICD-10-CM | POA: Diagnosis not present

## 2022-01-30 DIAGNOSIS — N179 Acute kidney failure, unspecified: Secondary | ICD-10-CM | POA: Diagnosis not present

## 2022-01-30 DIAGNOSIS — R531 Weakness: Secondary | ICD-10-CM | POA: Diagnosis not present

## 2022-01-30 DIAGNOSIS — Z944 Liver transplant status: Secondary | ICD-10-CM | POA: Diagnosis not present

## 2022-01-30 DIAGNOSIS — E119 Type 2 diabetes mellitus without complications: Secondary | ICD-10-CM | POA: Diagnosis not present

## 2022-01-30 DIAGNOSIS — R55 Syncope and collapse: Secondary | ICD-10-CM | POA: Diagnosis not present

## 2022-01-30 DIAGNOSIS — G459 Transient cerebral ischemic attack, unspecified: Secondary | ICD-10-CM | POA: Diagnosis not present

## 2022-01-30 DIAGNOSIS — E860 Dehydration: Secondary | ICD-10-CM | POA: Diagnosis not present

## 2022-01-30 DIAGNOSIS — Z79899 Other long term (current) drug therapy: Secondary | ICD-10-CM | POA: Diagnosis not present

## 2022-01-30 DIAGNOSIS — N944 Primary dysmenorrhea: Secondary | ICD-10-CM | POA: Diagnosis not present

## 2022-01-30 DIAGNOSIS — G939 Disorder of brain, unspecified: Secondary | ICD-10-CM | POA: Diagnosis not present

## 2022-01-30 DIAGNOSIS — R29898 Other symptoms and signs involving the musculoskeletal system: Secondary | ICD-10-CM | POA: Diagnosis not present

## 2022-01-30 DIAGNOSIS — N289 Disorder of kidney and ureter, unspecified: Secondary | ICD-10-CM | POA: Diagnosis not present

## 2022-01-30 DIAGNOSIS — G319 Degenerative disease of nervous system, unspecified: Secondary | ICD-10-CM | POA: Diagnosis not present

## 2022-01-30 DIAGNOSIS — R42 Dizziness and giddiness: Secondary | ICD-10-CM | POA: Diagnosis not present

## 2022-01-30 DIAGNOSIS — Z882 Allergy status to sulfonamides status: Secondary | ICD-10-CM | POA: Diagnosis not present

## 2022-01-30 DIAGNOSIS — R0902 Hypoxemia: Secondary | ICD-10-CM | POA: Diagnosis not present

## 2022-01-30 DIAGNOSIS — I517 Cardiomegaly: Secondary | ICD-10-CM | POA: Diagnosis not present

## 2022-01-31 DIAGNOSIS — R42 Dizziness and giddiness: Secondary | ICD-10-CM | POA: Diagnosis not present

## 2022-01-31 DIAGNOSIS — R29898 Other symptoms and signs involving the musculoskeletal system: Secondary | ICD-10-CM | POA: Diagnosis not present

## 2022-01-31 DIAGNOSIS — R531 Weakness: Secondary | ICD-10-CM | POA: Diagnosis not present

## 2022-01-31 DIAGNOSIS — G319 Degenerative disease of nervous system, unspecified: Secondary | ICD-10-CM | POA: Diagnosis not present

## 2022-01-31 DIAGNOSIS — N179 Acute kidney failure, unspecified: Secondary | ICD-10-CM | POA: Diagnosis not present

## 2022-02-14 DIAGNOSIS — R69 Illness, unspecified: Secondary | ICD-10-CM | POA: Diagnosis not present

## 2022-02-14 DIAGNOSIS — E119 Type 2 diabetes mellitus without complications: Secondary | ICD-10-CM | POA: Diagnosis not present

## 2022-02-14 DIAGNOSIS — R9089 Other abnormal findings on diagnostic imaging of central nervous system: Secondary | ICD-10-CM | POA: Diagnosis not present

## 2022-02-14 DIAGNOSIS — Z6826 Body mass index (BMI) 26.0-26.9, adult: Secondary | ICD-10-CM | POA: Diagnosis not present

## 2022-02-14 DIAGNOSIS — R55 Syncope and collapse: Secondary | ICD-10-CM | POA: Diagnosis not present

## 2022-02-14 DIAGNOSIS — N179 Acute kidney failure, unspecified: Secondary | ICD-10-CM | POA: Diagnosis not present

## 2022-02-14 DIAGNOSIS — I10 Essential (primary) hypertension: Secondary | ICD-10-CM | POA: Diagnosis not present

## 2022-05-24 DIAGNOSIS — Z944 Liver transplant status: Secondary | ICD-10-CM | POA: Diagnosis not present

## 2022-05-24 DIAGNOSIS — D849 Immunodeficiency, unspecified: Secondary | ICD-10-CM | POA: Diagnosis not present

## 2022-05-27 DIAGNOSIS — E11649 Type 2 diabetes mellitus with hypoglycemia without coma: Secondary | ICD-10-CM | POA: Diagnosis not present

## 2022-05-27 DIAGNOSIS — R0689 Other abnormalities of breathing: Secondary | ICD-10-CM | POA: Diagnosis not present

## 2022-05-27 DIAGNOSIS — I959 Hypotension, unspecified: Secondary | ICD-10-CM | POA: Diagnosis not present

## 2022-05-27 DIAGNOSIS — E161 Other hypoglycemia: Secondary | ICD-10-CM | POA: Diagnosis not present

## 2022-05-27 DIAGNOSIS — E162 Hypoglycemia, unspecified: Secondary | ICD-10-CM | POA: Diagnosis not present

## 2022-05-27 DIAGNOSIS — R4020 Unspecified coma: Secondary | ICD-10-CM | POA: Diagnosis not present

## 2022-05-27 DIAGNOSIS — Z944 Liver transplant status: Secondary | ICD-10-CM | POA: Diagnosis not present

## 2022-12-01 DIAGNOSIS — D849 Immunodeficiency, unspecified: Secondary | ICD-10-CM | POA: Diagnosis not present

## 2022-12-01 DIAGNOSIS — Z944 Liver transplant status: Secondary | ICD-10-CM | POA: Diagnosis not present

## 2023-01-23 DIAGNOSIS — K222 Esophageal obstruction: Secondary | ICD-10-CM | POA: Diagnosis not present

## 2023-01-23 DIAGNOSIS — D519 Vitamin B12 deficiency anemia, unspecified: Secondary | ICD-10-CM | POA: Diagnosis not present

## 2023-01-23 DIAGNOSIS — D500 Iron deficiency anemia secondary to blood loss (chronic): Secondary | ICD-10-CM | POA: Diagnosis not present

## 2023-01-23 DIAGNOSIS — R509 Fever, unspecified: Secondary | ICD-10-CM | POA: Diagnosis not present

## 2023-01-23 DIAGNOSIS — E559 Vitamin D deficiency, unspecified: Secondary | ICD-10-CM | POA: Diagnosis not present

## 2023-01-23 DIAGNOSIS — J9811 Atelectasis: Secondary | ICD-10-CM | POA: Diagnosis not present

## 2023-01-23 DIAGNOSIS — K2289 Other specified disease of esophagus: Secondary | ICD-10-CM | POA: Diagnosis not present

## 2023-01-23 DIAGNOSIS — Z944 Liver transplant status: Secondary | ICD-10-CM | POA: Diagnosis not present

## 2023-01-23 DIAGNOSIS — Z86010 Personal history of colonic polyps: Secondary | ICD-10-CM | POA: Diagnosis not present

## 2023-01-23 DIAGNOSIS — D509 Iron deficiency anemia, unspecified: Secondary | ICD-10-CM | POA: Diagnosis not present

## 2023-01-23 DIAGNOSIS — E8809 Other disorders of plasma-protein metabolism, not elsewhere classified: Secondary | ICD-10-CM | POA: Diagnosis not present

## 2023-01-23 DIAGNOSIS — M19031 Primary osteoarthritis, right wrist: Secondary | ICD-10-CM | POA: Diagnosis not present

## 2023-01-23 DIAGNOSIS — Z79899 Other long term (current) drug therapy: Secondary | ICD-10-CM | POA: Diagnosis not present

## 2023-01-23 DIAGNOSIS — M1811 Unilateral primary osteoarthritis of first carpometacarpal joint, right hand: Secondary | ICD-10-CM | POA: Diagnosis not present

## 2023-01-23 DIAGNOSIS — R627 Adult failure to thrive: Secondary | ICD-10-CM | POA: Diagnosis not present

## 2023-01-23 DIAGNOSIS — K5900 Constipation, unspecified: Secondary | ICD-10-CM | POA: Diagnosis not present

## 2023-01-23 DIAGNOSIS — M85851 Other specified disorders of bone density and structure, right thigh: Secondary | ICD-10-CM | POA: Diagnosis not present

## 2023-01-23 DIAGNOSIS — M4854XA Collapsed vertebra, not elsewhere classified, thoracic region, initial encounter for fracture: Secondary | ICD-10-CM | POA: Diagnosis not present

## 2023-01-23 DIAGNOSIS — N179 Acute kidney failure, unspecified: Secondary | ICD-10-CM | POA: Diagnosis not present

## 2023-01-23 DIAGNOSIS — N3289 Other specified disorders of bladder: Secondary | ICD-10-CM | POA: Diagnosis not present

## 2023-01-23 DIAGNOSIS — E871 Hypo-osmolality and hyponatremia: Secondary | ICD-10-CM | POA: Diagnosis not present

## 2023-01-23 DIAGNOSIS — D849 Immunodeficiency, unspecified: Secondary | ICD-10-CM | POA: Diagnosis not present

## 2023-01-23 DIAGNOSIS — K3189 Other diseases of stomach and duodenum: Secondary | ICD-10-CM | POA: Diagnosis not present

## 2023-01-23 DIAGNOSIS — G629 Polyneuropathy, unspecified: Secondary | ICD-10-CM | POA: Diagnosis not present

## 2023-01-23 DIAGNOSIS — G8929 Other chronic pain: Secondary | ICD-10-CM | POA: Diagnosis not present

## 2023-01-23 DIAGNOSIS — N1831 Chronic kidney disease, stage 3a: Secondary | ICD-10-CM | POA: Diagnosis not present

## 2023-01-23 DIAGNOSIS — E538 Deficiency of other specified B group vitamins: Secondary | ICD-10-CM | POA: Diagnosis not present

## 2023-01-23 DIAGNOSIS — R42 Dizziness and giddiness: Secondary | ICD-10-CM | POA: Diagnosis not present

## 2023-01-23 DIAGNOSIS — M109 Gout, unspecified: Secondary | ICD-10-CM | POA: Diagnosis not present

## 2023-01-23 DIAGNOSIS — Z1152 Encounter for screening for COVID-19: Secondary | ICD-10-CM | POA: Diagnosis not present

## 2023-01-23 DIAGNOSIS — Z8719 Personal history of other diseases of the digestive system: Secondary | ICD-10-CM | POA: Diagnosis not present

## 2023-01-23 DIAGNOSIS — N3000 Acute cystitis without hematuria: Secondary | ICD-10-CM | POA: Diagnosis not present

## 2023-01-23 DIAGNOSIS — I10 Essential (primary) hypertension: Secondary | ICD-10-CM | POA: Diagnosis not present

## 2023-01-23 DIAGNOSIS — M25551 Pain in right hip: Secondary | ICD-10-CM | POA: Diagnosis not present

## 2023-01-23 DIAGNOSIS — M1120 Other chondrocalcinosis, unspecified site: Secondary | ICD-10-CM | POA: Diagnosis not present

## 2023-01-23 DIAGNOSIS — M25531 Pain in right wrist: Secondary | ICD-10-CM | POA: Diagnosis not present

## 2023-01-23 DIAGNOSIS — Z7982 Long term (current) use of aspirin: Secondary | ICD-10-CM | POA: Diagnosis not present

## 2023-01-23 DIAGNOSIS — D508 Other iron deficiency anemias: Secondary | ICD-10-CM | POA: Diagnosis not present

## 2023-01-23 DIAGNOSIS — S22000A Wedge compression fracture of unspecified thoracic vertebra, initial encounter for closed fracture: Secondary | ICD-10-CM | POA: Diagnosis not present

## 2023-01-23 DIAGNOSIS — X58XXXA Exposure to other specified factors, initial encounter: Secondary | ICD-10-CM | POA: Diagnosis not present

## 2023-01-23 DIAGNOSIS — Z538 Procedure and treatment not carried out for other reasons: Secondary | ICD-10-CM | POA: Diagnosis not present

## 2023-01-23 DIAGNOSIS — J90 Pleural effusion, not elsewhere classified: Secondary | ICD-10-CM | POA: Diagnosis not present

## 2023-01-23 DIAGNOSIS — I129 Hypertensive chronic kidney disease with stage 1 through stage 4 chronic kidney disease, or unspecified chronic kidney disease: Secondary | ICD-10-CM | POA: Diagnosis not present

## 2023-01-23 DIAGNOSIS — D649 Anemia, unspecified: Secondary | ICD-10-CM | POA: Diagnosis not present

## 2023-01-23 DIAGNOSIS — M8580 Other specified disorders of bone density and structure, unspecified site: Secondary | ICD-10-CM | POA: Diagnosis not present

## 2023-02-02 DIAGNOSIS — I8500 Esophageal varices without bleeding: Secondary | ICD-10-CM | POA: Diagnosis not present

## 2023-02-02 DIAGNOSIS — E538 Deficiency of other specified B group vitamins: Secondary | ICD-10-CM | POA: Diagnosis not present

## 2023-02-02 DIAGNOSIS — M85851 Other specified disorders of bone density and structure, right thigh: Secondary | ICD-10-CM | POA: Diagnosis not present

## 2023-02-02 DIAGNOSIS — E559 Vitamin D deficiency, unspecified: Secondary | ICD-10-CM | POA: Diagnosis not present

## 2023-02-02 DIAGNOSIS — G8929 Other chronic pain: Secondary | ICD-10-CM | POA: Diagnosis not present

## 2023-02-02 DIAGNOSIS — M1811 Unilateral primary osteoarthritis of first carpometacarpal joint, right hand: Secondary | ICD-10-CM | POA: Diagnosis not present

## 2023-02-02 DIAGNOSIS — D696 Thrombocytopenia, unspecified: Secondary | ICD-10-CM | POA: Diagnosis not present

## 2023-02-02 DIAGNOSIS — G629 Polyneuropathy, unspecified: Secondary | ICD-10-CM | POA: Diagnosis not present

## 2023-02-02 DIAGNOSIS — I959 Hypotension, unspecified: Secondary | ICD-10-CM | POA: Diagnosis not present

## 2023-02-02 DIAGNOSIS — D689 Coagulation defect, unspecified: Secondary | ICD-10-CM | POA: Diagnosis not present

## 2023-02-02 DIAGNOSIS — E871 Hypo-osmolality and hyponatremia: Secondary | ICD-10-CM | POA: Diagnosis not present

## 2023-02-02 DIAGNOSIS — Z944 Liver transplant status: Secondary | ICD-10-CM | POA: Diagnosis not present

## 2023-02-02 DIAGNOSIS — Z79899 Other long term (current) drug therapy: Secondary | ICD-10-CM | POA: Diagnosis not present

## 2023-02-02 DIAGNOSIS — M160 Bilateral primary osteoarthritis of hip: Secondary | ICD-10-CM | POA: Diagnosis not present

## 2023-02-02 DIAGNOSIS — M461 Sacroiliitis, not elsewhere classified: Secondary | ICD-10-CM | POA: Diagnosis not present

## 2023-02-02 DIAGNOSIS — E46 Unspecified protein-calorie malnutrition: Secondary | ICD-10-CM | POA: Diagnosis not present

## 2023-02-02 DIAGNOSIS — J90 Pleural effusion, not elsewhere classified: Secondary | ICD-10-CM | POA: Diagnosis not present

## 2023-02-02 DIAGNOSIS — I129 Hypertensive chronic kidney disease with stage 1 through stage 4 chronic kidney disease, or unspecified chronic kidney disease: Secondary | ICD-10-CM | POA: Diagnosis not present

## 2023-02-02 DIAGNOSIS — Z87440 Personal history of urinary (tract) infections: Secondary | ICD-10-CM | POA: Diagnosis not present

## 2023-02-02 DIAGNOSIS — N1831 Chronic kidney disease, stage 3a: Secondary | ICD-10-CM | POA: Diagnosis not present

## 2023-02-02 DIAGNOSIS — D849 Immunodeficiency, unspecified: Secondary | ICD-10-CM | POA: Diagnosis not present

## 2023-02-02 DIAGNOSIS — D631 Anemia in chronic kidney disease: Secondary | ICD-10-CM | POA: Diagnosis not present

## 2023-02-16 DIAGNOSIS — Z79899 Other long term (current) drug therapy: Secondary | ICD-10-CM | POA: Diagnosis not present

## 2023-02-16 DIAGNOSIS — E119 Type 2 diabetes mellitus without complications: Secondary | ICD-10-CM | POA: Diagnosis not present

## 2023-02-16 DIAGNOSIS — N289 Disorder of kidney and ureter, unspecified: Secondary | ICD-10-CM | POA: Diagnosis not present

## 2023-02-16 DIAGNOSIS — E559 Vitamin D deficiency, unspecified: Secondary | ICD-10-CM | POA: Diagnosis not present

## 2023-02-16 DIAGNOSIS — E538 Deficiency of other specified B group vitamins: Secondary | ICD-10-CM | POA: Diagnosis not present

## 2023-02-16 DIAGNOSIS — R42 Dizziness and giddiness: Secondary | ICD-10-CM | POA: Diagnosis not present

## 2023-02-16 DIAGNOSIS — D509 Iron deficiency anemia, unspecified: Secondary | ICD-10-CM | POA: Diagnosis not present

## 2023-02-16 DIAGNOSIS — I10 Essential (primary) hypertension: Secondary | ICD-10-CM | POA: Diagnosis not present

## 2023-02-16 DIAGNOSIS — Z944 Liver transplant status: Secondary | ICD-10-CM | POA: Diagnosis not present

## 2023-03-14 DIAGNOSIS — Z79899 Other long term (current) drug therapy: Secondary | ICD-10-CM | POA: Diagnosis not present

## 2023-03-14 DIAGNOSIS — D849 Immunodeficiency, unspecified: Secondary | ICD-10-CM | POA: Diagnosis not present

## 2023-03-14 DIAGNOSIS — T451X5A Adverse effect of antineoplastic and immunosuppressive drugs, initial encounter: Secondary | ICD-10-CM | POA: Diagnosis not present

## 2023-03-14 DIAGNOSIS — Z5329 Procedure and treatment not carried out because of patient's decision for other reasons: Secondary | ICD-10-CM | POA: Diagnosis not present

## 2023-03-14 DIAGNOSIS — D84821 Immunodeficiency due to drugs: Secondary | ICD-10-CM | POA: Diagnosis not present

## 2023-03-14 DIAGNOSIS — R7989 Other specified abnormal findings of blood chemistry: Secondary | ICD-10-CM | POA: Diagnosis not present

## 2023-03-14 DIAGNOSIS — I959 Hypotension, unspecified: Secondary | ICD-10-CM | POA: Diagnosis not present

## 2023-03-14 DIAGNOSIS — R031 Nonspecific low blood-pressure reading: Secondary | ICD-10-CM | POA: Diagnosis not present

## 2023-03-14 DIAGNOSIS — Z944 Liver transplant status: Secondary | ICD-10-CM | POA: Diagnosis not present

## 2023-05-16 DIAGNOSIS — D84821 Immunodeficiency due to drugs: Secondary | ICD-10-CM | POA: Diagnosis not present

## 2023-05-16 DIAGNOSIS — G621 Alcoholic polyneuropathy: Secondary | ICD-10-CM | POA: Diagnosis not present

## 2023-05-16 DIAGNOSIS — Z008 Encounter for other general examination: Secondary | ICD-10-CM | POA: Diagnosis not present

## 2023-05-16 DIAGNOSIS — E1122 Type 2 diabetes mellitus with diabetic chronic kidney disease: Secondary | ICD-10-CM | POA: Diagnosis not present

## 2023-05-16 DIAGNOSIS — F1021 Alcohol dependence, in remission: Secondary | ICD-10-CM | POA: Diagnosis not present

## 2023-05-16 DIAGNOSIS — I129 Hypertensive chronic kidney disease with stage 1 through stage 4 chronic kidney disease, or unspecified chronic kidney disease: Secondary | ICD-10-CM | POA: Diagnosis not present

## 2023-05-16 DIAGNOSIS — L409 Psoriasis, unspecified: Secondary | ICD-10-CM | POA: Diagnosis not present

## 2023-05-16 DIAGNOSIS — R32 Unspecified urinary incontinence: Secondary | ICD-10-CM | POA: Diagnosis not present

## 2023-05-16 DIAGNOSIS — M1990 Unspecified osteoarthritis, unspecified site: Secondary | ICD-10-CM | POA: Diagnosis not present

## 2023-05-16 DIAGNOSIS — Z87891 Personal history of nicotine dependence: Secondary | ICD-10-CM | POA: Diagnosis not present

## 2023-05-16 DIAGNOSIS — M8580 Other specified disorders of bone density and structure, unspecified site: Secondary | ICD-10-CM | POA: Diagnosis not present

## 2023-05-16 DIAGNOSIS — K5900 Constipation, unspecified: Secondary | ICD-10-CM | POA: Diagnosis not present

## 2023-05-16 DIAGNOSIS — R269 Unspecified abnormalities of gait and mobility: Secondary | ICD-10-CM | POA: Diagnosis not present

## 2023-05-24 ENCOUNTER — Telehealth: Payer: Self-pay

## 2023-05-24 NOTE — Patient Outreach (Signed)
  Care Coordination   Initial Visit Note   05/24/2023 Name: Thomas [name] MRN: [id number] DOB: [birth year]  C[place]urn Knaus Saxer is a 62 y.o. year old male who sees Lonie Peak, [place] for primary care. I spoke with  Nolberto Hanlon by phone today.  What matters to the patients health and wellness today?  Placed call to patient to review an offer [facility] care coordination program.  Explained program and patient request I call him back from a phone and not a computer.  I offered for patient to call me back and he wanted me to text him. I explained that we do not text. Then patient hung up on me.    SDOH assessments and interventions completed:  No    Care Coordination Interventions:  No, not indicated   Follow up plan: No further intervention required.   Encounter Outcome:  Pt. Refused   Rowe Pavy, RN, BSN, CEN [facility] [phone]

## 2023-06-25 DIAGNOSIS — W19XXXA Unspecified fall, initial encounter: Secondary | ICD-10-CM | POA: Diagnosis not present

## 2023-06-25 DIAGNOSIS — S0990XA Unspecified injury of head, initial encounter: Secondary | ICD-10-CM | POA: Diagnosis not present

## 2023-06-25 DIAGNOSIS — S0181XA Laceration without foreign body of other part of head, initial encounter: Secondary | ICD-10-CM | POA: Diagnosis not present

## 2023-06-25 DIAGNOSIS — Z79899 Other long term (current) drug therapy: Secondary | ICD-10-CM | POA: Diagnosis not present

## 2023-06-25 DIAGNOSIS — Z743 Need for continuous supervision: Secondary | ICD-10-CM | POA: Diagnosis not present

## 2023-06-25 DIAGNOSIS — S0101XA Laceration without foreign body of scalp, initial encounter: Secondary | ICD-10-CM | POA: Diagnosis not present

## 2023-06-25 DIAGNOSIS — Z043 Encounter for examination and observation following other accident: Secondary | ICD-10-CM | POA: Diagnosis not present

## 2023-06-25 DIAGNOSIS — R42 Dizziness and giddiness: Secondary | ICD-10-CM | POA: Diagnosis not present

## 2023-06-25 DIAGNOSIS — R58 Hemorrhage, not elsewhere classified: Secondary | ICD-10-CM | POA: Diagnosis not present

## 2023-06-25 DIAGNOSIS — F1010 Alcohol abuse, uncomplicated: Secondary | ICD-10-CM | POA: Diagnosis not present

## 2023-06-25 DIAGNOSIS — Z23 Encounter for immunization: Secondary | ICD-10-CM | POA: Diagnosis not present

## 2023-10-25 DIAGNOSIS — S0101XA Laceration without foreign body of scalp, initial encounter: Secondary | ICD-10-CM | POA: Diagnosis not present

## 2023-10-25 DIAGNOSIS — W19XXXA Unspecified fall, initial encounter: Secondary | ICD-10-CM | POA: Diagnosis not present

## 2023-10-25 DIAGNOSIS — S60512A Abrasion of left hand, initial encounter: Secondary | ICD-10-CM | POA: Diagnosis not present

## 2023-10-25 DIAGNOSIS — Z23 Encounter for immunization: Secondary | ICD-10-CM | POA: Diagnosis not present

## 2023-10-25 DIAGNOSIS — Z743 Need for continuous supervision: Secondary | ICD-10-CM | POA: Diagnosis not present

## 2023-10-25 DIAGNOSIS — I10 Essential (primary) hypertension: Secondary | ICD-10-CM | POA: Diagnosis not present

## 2023-10-25 DIAGNOSIS — R58 Hemorrhage, not elsewhere classified: Secondary | ICD-10-CM | POA: Diagnosis not present

## 2023-10-25 DIAGNOSIS — S199XXA Unspecified injury of neck, initial encounter: Secondary | ICD-10-CM | POA: Diagnosis not present

## 2023-10-25 DIAGNOSIS — E119 Type 2 diabetes mellitus without complications: Secondary | ICD-10-CM | POA: Diagnosis not present

## 2023-10-25 DIAGNOSIS — Z944 Liver transplant status: Secondary | ICD-10-CM | POA: Diagnosis not present

## 2023-10-25 DIAGNOSIS — S6992XA Unspecified injury of left wrist, hand and finger(s), initial encounter: Secondary | ICD-10-CM | POA: Diagnosis not present

## 2023-10-25 DIAGNOSIS — S0990XA Unspecified injury of head, initial encounter: Secondary | ICD-10-CM | POA: Diagnosis not present

## 2023-10-25 DIAGNOSIS — Z79899 Other long term (current) drug therapy: Secondary | ICD-10-CM | POA: Diagnosis not present

## 2024-05-15 DIAGNOSIS — G629 Polyneuropathy, unspecified: Secondary | ICD-10-CM | POA: Diagnosis not present

## 2024-05-15 DIAGNOSIS — R42 Dizziness and giddiness: Secondary | ICD-10-CM | POA: Diagnosis not present

## 2024-05-15 DIAGNOSIS — D509 Iron deficiency anemia, unspecified: Secondary | ICD-10-CM | POA: Diagnosis not present

## 2024-05-15 DIAGNOSIS — E559 Vitamin D deficiency, unspecified: Secondary | ICD-10-CM | POA: Diagnosis not present

## 2024-05-15 DIAGNOSIS — E119 Type 2 diabetes mellitus without complications: Secondary | ICD-10-CM | POA: Diagnosis not present

## 2024-05-15 DIAGNOSIS — G312 Degeneration of nervous system due to alcohol: Secondary | ICD-10-CM | POA: Diagnosis not present

## 2024-05-15 DIAGNOSIS — D849 Immunodeficiency, unspecified: Secondary | ICD-10-CM | POA: Diagnosis not present

## 2024-05-15 DIAGNOSIS — Z944 Liver transplant status: Secondary | ICD-10-CM | POA: Diagnosis not present

## 2024-05-15 DIAGNOSIS — E538 Deficiency of other specified B group vitamins: Secondary | ICD-10-CM | POA: Diagnosis not present

## 2024-05-15 DIAGNOSIS — F1020 Alcohol dependence, uncomplicated: Secondary | ICD-10-CM | POA: Diagnosis not present
